# Patient Record
Sex: Male | Born: 1935 | Race: Black or African American | Hispanic: No | Marital: Married | Smoking: Former smoker
Health system: Southern US, Community
[De-identification: ages and names within clinical notes are randomized; demographics above are authoritative.]

## PROBLEM LIST (undated history)

## (undated) DIAGNOSIS — I480 Paroxysmal atrial fibrillation: Secondary | ICD-10-CM

## (undated) DIAGNOSIS — E039 Hypothyroidism, unspecified: Secondary | ICD-10-CM

## (undated) DIAGNOSIS — I447 Left bundle-branch block, unspecified: Secondary | ICD-10-CM

## (undated) DIAGNOSIS — E785 Hyperlipidemia, unspecified: Secondary | ICD-10-CM

## (undated) DIAGNOSIS — Z992 Dependence on renal dialysis: Secondary | ICD-10-CM

## (undated) DIAGNOSIS — F32A Depression, unspecified: Secondary | ICD-10-CM

## (undated) DIAGNOSIS — M1990 Unspecified osteoarthritis, unspecified site: Secondary | ICD-10-CM

## (undated) DIAGNOSIS — I2510 Atherosclerotic heart disease of native coronary artery without angina pectoris: Secondary | ICD-10-CM

## (undated) DIAGNOSIS — D638 Anemia in other chronic diseases classified elsewhere: Secondary | ICD-10-CM

## (undated) DIAGNOSIS — I998 Other disorder of circulatory system: Secondary | ICD-10-CM

## (undated) DIAGNOSIS — I10 Essential (primary) hypertension: Secondary | ICD-10-CM

## (undated) DIAGNOSIS — I219 Acute myocardial infarction, unspecified: Secondary | ICD-10-CM

## (undated) DIAGNOSIS — Z95810 Presence of automatic (implantable) cardiac defibrillator: Secondary | ICD-10-CM

## (undated) DIAGNOSIS — K922 Gastrointestinal hemorrhage, unspecified: Secondary | ICD-10-CM

## (undated) DIAGNOSIS — I70229 Atherosclerosis of native arteries of extremities with rest pain, unspecified extremity: Secondary | ICD-10-CM

## (undated) DIAGNOSIS — F329 Major depressive disorder, single episode, unspecified: Secondary | ICD-10-CM

## (undated) DIAGNOSIS — I471 Supraventricular tachycardia: Secondary | ICD-10-CM

## (undated) DIAGNOSIS — Z9289 Personal history of other medical treatment: Secondary | ICD-10-CM

## (undated) DIAGNOSIS — I5022 Chronic systolic (congestive) heart failure: Secondary | ICD-10-CM

## (undated) DIAGNOSIS — I255 Ischemic cardiomyopathy: Secondary | ICD-10-CM

## (undated) DIAGNOSIS — C61 Malignant neoplasm of prostate: Secondary | ICD-10-CM

## (undated) DIAGNOSIS — N186 End stage renal disease: Secondary | ICD-10-CM

## (undated) DIAGNOSIS — I4719 Other supraventricular tachycardia: Secondary | ICD-10-CM

## (undated) DIAGNOSIS — I739 Peripheral vascular disease, unspecified: Secondary | ICD-10-CM

## (undated) DIAGNOSIS — Z8739 Personal history of other diseases of the musculoskeletal system and connective tissue: Secondary | ICD-10-CM

## (undated) HISTORY — PX: INSERTION PROSTATE RADIATION SEED: SUR718

## (undated) HISTORY — PX: TOTAL KNEE ARTHROPLASTY: SHX125

## (undated) HISTORY — PX: BIV ICD GENERTAOR CHANGE OUT: SHX5745

## (undated) HISTORY — DX: Other disorder of circulatory system: I99.8

## (undated) HISTORY — DX: Malignant neoplasm of prostate: C61

## (undated) HISTORY — PX: JOINT REPLACEMENT: SHX530

## (undated) HISTORY — DX: Hyperlipidemia, unspecified: E78.5

## (undated) HISTORY — DX: Atherosclerosis of native arteries of extremities with rest pain, unspecified extremity: I70.229

---

## 2001-01-10 ENCOUNTER — Encounter (HOSPITAL_BASED_OUTPATIENT_CLINIC_OR_DEPARTMENT_OTHER): Payer: Self-pay | Admitting: General Surgery

## 2001-01-12 ENCOUNTER — Ambulatory Visit (HOSPITAL_COMMUNITY): Admission: RE | Admit: 2001-01-12 | Discharge: 2001-01-12 | Payer: Self-pay | Admitting: General Surgery

## 2001-01-12 ENCOUNTER — Encounter (INDEPENDENT_AMBULATORY_CARE_PROVIDER_SITE_OTHER): Payer: Self-pay | Admitting: *Deleted

## 2001-02-13 ENCOUNTER — Other Ambulatory Visit: Admission: RE | Admit: 2001-02-13 | Discharge: 2001-02-13 | Payer: Self-pay | Admitting: Urology

## 2001-02-13 ENCOUNTER — Encounter (INDEPENDENT_AMBULATORY_CARE_PROVIDER_SITE_OTHER): Payer: Self-pay | Admitting: Specialist

## 2001-02-23 ENCOUNTER — Encounter: Admission: RE | Admit: 2001-02-23 | Discharge: 2001-05-24 | Payer: Self-pay | Admitting: Radiation Oncology

## 2001-05-29 ENCOUNTER — Encounter: Payer: Self-pay | Admitting: Urology

## 2001-05-29 ENCOUNTER — Ambulatory Visit (HOSPITAL_COMMUNITY): Admission: RE | Admit: 2001-05-29 | Discharge: 2001-05-29 | Payer: Self-pay | Admitting: Urology

## 2001-06-19 ENCOUNTER — Ambulatory Visit: Admission: RE | Admit: 2001-06-19 | Discharge: 2001-09-17 | Payer: Self-pay | Admitting: Radiation Oncology

## 2002-02-28 ENCOUNTER — Encounter: Payer: Self-pay | Admitting: *Deleted

## 2002-03-06 ENCOUNTER — Inpatient Hospital Stay (HOSPITAL_COMMUNITY): Admission: RE | Admit: 2002-03-06 | Discharge: 2002-03-10 | Payer: Self-pay | Admitting: *Deleted

## 2003-06-24 ENCOUNTER — Encounter: Payer: Self-pay | Admitting: Pulmonary Disease

## 2003-06-24 ENCOUNTER — Ambulatory Visit (HOSPITAL_COMMUNITY): Admission: RE | Admit: 2003-06-24 | Discharge: 2003-06-24 | Payer: Self-pay | Admitting: Pulmonary Disease

## 2003-07-02 ENCOUNTER — Emergency Department (HOSPITAL_COMMUNITY): Admission: EM | Admit: 2003-07-02 | Discharge: 2003-07-02 | Payer: Self-pay | Admitting: Emergency Medicine

## 2003-07-12 ENCOUNTER — Ambulatory Visit (HOSPITAL_COMMUNITY): Admission: RE | Admit: 2003-07-12 | Discharge: 2003-07-12 | Payer: Self-pay | Admitting: Pulmonary Disease

## 2003-07-12 ENCOUNTER — Encounter: Payer: Self-pay | Admitting: Pulmonary Disease

## 2005-02-22 HISTORY — PX: CORONARY ANGIOPLASTY WITH STENT PLACEMENT: SHX49

## 2005-02-26 ENCOUNTER — Inpatient Hospital Stay (HOSPITAL_COMMUNITY): Admission: EM | Admit: 2005-02-26 | Discharge: 2005-03-09 | Payer: Self-pay | Admitting: Emergency Medicine

## 2005-03-01 ENCOUNTER — Encounter (INDEPENDENT_AMBULATORY_CARE_PROVIDER_SITE_OTHER): Payer: Self-pay | Admitting: Cardiology

## 2005-03-29 ENCOUNTER — Encounter (HOSPITAL_COMMUNITY): Admission: RE | Admit: 2005-03-29 | Discharge: 2005-06-27 | Payer: Self-pay | Admitting: Cardiology

## 2005-06-09 ENCOUNTER — Ambulatory Visit (HOSPITAL_COMMUNITY): Admission: RE | Admit: 2005-06-09 | Discharge: 2005-06-09 | Payer: Self-pay | Admitting: Pulmonary Disease

## 2005-07-27 ENCOUNTER — Encounter (HOSPITAL_COMMUNITY): Admission: RE | Admit: 2005-07-27 | Discharge: 2005-10-24 | Payer: Self-pay | Admitting: Cardiology

## 2005-10-26 ENCOUNTER — Encounter (HOSPITAL_COMMUNITY): Admission: RE | Admit: 2005-10-26 | Discharge: 2006-01-24 | Payer: Self-pay | Admitting: Cardiology

## 2005-11-11 ENCOUNTER — Ambulatory Visit (HOSPITAL_COMMUNITY): Admission: RE | Admit: 2005-11-11 | Discharge: 2005-11-11 | Payer: Self-pay | Admitting: Pulmonary Disease

## 2006-08-01 ENCOUNTER — Encounter (HOSPITAL_COMMUNITY): Admission: RE | Admit: 2006-08-01 | Discharge: 2006-10-30 | Payer: Self-pay | Admitting: Cardiology

## 2006-10-25 HISTORY — PX: OTHER SURGICAL HISTORY: SHX169

## 2006-11-04 ENCOUNTER — Ambulatory Visit: Payer: Self-pay | Admitting: Internal Medicine

## 2006-11-04 LAB — CONVERTED CEMR LAB
Calcium: 9.2 mg/dL (ref 8.4–10.5)
Chloride: 104 meq/L (ref 96–112)
Creatinine, Ser: 2.1 mg/dL — ABNORMAL HIGH (ref 0.4–1.5)
Glomerular Filtration Rate, Af Am: 40 mL/min/{1.73_m2}
Glucose, Bld: 117 mg/dL — ABNORMAL HIGH (ref 70–99)
HCT: 44.3 % (ref 39.0–52.0)
INR: 2.2 — ABNORMAL HIGH (ref 0.9–2.0)
Potassium: 3.7 meq/L (ref 3.5–5.1)
WBC: 4.1 10*3/uL — ABNORMAL LOW (ref 4.5–10.5)

## 2006-11-07 ENCOUNTER — Ambulatory Visit: Payer: Self-pay | Admitting: Internal Medicine

## 2006-11-07 ENCOUNTER — Inpatient Hospital Stay (HOSPITAL_COMMUNITY): Admission: RE | Admit: 2006-11-07 | Discharge: 2006-11-08 | Payer: Self-pay | Admitting: Internal Medicine

## 2006-11-16 ENCOUNTER — Ambulatory Visit: Payer: Self-pay

## 2007-02-14 ENCOUNTER — Ambulatory Visit: Payer: Self-pay | Admitting: Internal Medicine

## 2007-05-16 ENCOUNTER — Ambulatory Visit: Payer: Self-pay | Admitting: Internal Medicine

## 2007-08-15 ENCOUNTER — Ambulatory Visit: Payer: Self-pay | Admitting: Internal Medicine

## 2007-09-30 ENCOUNTER — Inpatient Hospital Stay (HOSPITAL_COMMUNITY): Admission: EM | Admit: 2007-09-30 | Discharge: 2007-10-05 | Payer: Self-pay | Admitting: Emergency Medicine

## 2007-09-30 ENCOUNTER — Ambulatory Visit: Payer: Self-pay | Admitting: Family Medicine

## 2007-11-21 ENCOUNTER — Ambulatory Visit: Payer: Self-pay | Admitting: Internal Medicine

## 2008-02-20 ENCOUNTER — Ambulatory Visit: Payer: Self-pay | Admitting: Internal Medicine

## 2008-05-14 ENCOUNTER — Ambulatory Visit: Payer: Self-pay | Admitting: Internal Medicine

## 2008-08-13 ENCOUNTER — Ambulatory Visit: Payer: Self-pay | Admitting: Internal Medicine

## 2008-09-07 ENCOUNTER — Emergency Department (HOSPITAL_COMMUNITY): Admission: EM | Admit: 2008-09-07 | Discharge: 2008-09-07 | Payer: Self-pay | Admitting: Emergency Medicine

## 2008-11-13 ENCOUNTER — Ambulatory Visit: Payer: Self-pay | Admitting: Internal Medicine

## 2008-11-19 ENCOUNTER — Ambulatory Visit: Payer: Self-pay | Admitting: Internal Medicine

## 2008-11-20 ENCOUNTER — Encounter: Payer: Self-pay | Admitting: Internal Medicine

## 2009-02-20 ENCOUNTER — Encounter: Payer: Self-pay | Admitting: Internal Medicine

## 2009-02-24 ENCOUNTER — Encounter: Payer: Self-pay | Admitting: Internal Medicine

## 2009-02-25 ENCOUNTER — Ambulatory Visit: Payer: Self-pay | Admitting: Internal Medicine

## 2009-03-04 ENCOUNTER — Encounter: Payer: Self-pay | Admitting: Internal Medicine

## 2009-05-27 ENCOUNTER — Ambulatory Visit: Payer: Self-pay | Admitting: Internal Medicine

## 2009-06-06 ENCOUNTER — Encounter: Payer: Self-pay | Admitting: Internal Medicine

## 2009-09-11 ENCOUNTER — Encounter: Payer: Self-pay | Admitting: Internal Medicine

## 2009-09-23 ENCOUNTER — Ambulatory Visit: Payer: Self-pay | Admitting: Internal Medicine

## 2009-09-29 ENCOUNTER — Encounter: Payer: Self-pay | Admitting: Internal Medicine

## 2009-11-13 ENCOUNTER — Ambulatory Visit: Payer: Self-pay | Admitting: Internal Medicine

## 2009-11-13 DIAGNOSIS — Z95810 Presence of automatic (implantable) cardiac defibrillator: Secondary | ICD-10-CM

## 2009-11-13 DIAGNOSIS — I5022 Chronic systolic (congestive) heart failure: Secondary | ICD-10-CM | POA: Insufficient documentation

## 2010-02-19 ENCOUNTER — Encounter: Payer: Self-pay | Admitting: Internal Medicine

## 2010-05-12 ENCOUNTER — Encounter: Payer: Self-pay | Admitting: Internal Medicine

## 2010-05-15 ENCOUNTER — Encounter: Payer: Self-pay | Admitting: Internal Medicine

## 2010-05-26 ENCOUNTER — Encounter: Payer: Self-pay | Admitting: Internal Medicine

## 2010-08-21 ENCOUNTER — Encounter: Payer: Self-pay | Admitting: Internal Medicine

## 2010-08-25 ENCOUNTER — Ambulatory Visit: Payer: Self-pay | Admitting: Internal Medicine

## 2010-09-24 ENCOUNTER — Ambulatory Visit: Payer: Self-pay | Admitting: Internal Medicine

## 2010-09-30 ENCOUNTER — Telehealth (INDEPENDENT_AMBULATORY_CARE_PROVIDER_SITE_OTHER): Payer: Self-pay | Admitting: *Deleted

## 2010-10-07 ENCOUNTER — Encounter: Payer: Self-pay | Admitting: Internal Medicine

## 2010-10-17 ENCOUNTER — Emergency Department (HOSPITAL_COMMUNITY)
Admission: EM | Admit: 2010-10-17 | Discharge: 2010-10-17 | Payer: Self-pay | Source: Home / Self Care | Admitting: Emergency Medicine

## 2010-11-14 ENCOUNTER — Encounter (INDEPENDENT_AMBULATORY_CARE_PROVIDER_SITE_OTHER): Payer: Self-pay | Admitting: *Deleted

## 2010-11-20 ENCOUNTER — Encounter: Payer: Self-pay | Admitting: Internal Medicine

## 2010-11-23 ENCOUNTER — Ambulatory Visit: Admit: 2010-11-23 | Payer: Self-pay | Admitting: Internal Medicine

## 2010-11-24 NOTE — Assessment & Plan Note (Signed)
Summary: PER CHECK OUT/SF   Visit Type:  Follow-up Primary Provider:  Corine Shelter, MD   History of Present Illness: [name] returns today for followup, he is a very pleasant male with a history of longstanding ischemic cardiomyopathy and congestive heart failure, left bundle-branch block, he is status post Bi-V ICD insertion. He returns today for followup.  The patient denies chest pain or shortness of breath.  He does admit to some dietary indiscretion with sodium.  He has not been to hospital since last we saw him a year ago. He denies peripheral edema. No palpitations.  Current Medications (verified): 1)  Eplerenone 25 Mg Tabs (Eplerenone) .... Take One Tablet By Mouth Twice Daily. 2)  Lipitor 40 Mg Tabs (Atorvastatin Calcium) .... Take One Tablet By Mouth Daily. 3)  Potassium Chloride Crys Cr 20 Meq Cr-Tabs (Potassium Chloride Crys Cr) .... Take One Tablet By Mouth Twice A Day 4)  Furosemide 80 Mg Tabs (Furosemide) .... Take One Tablet By Mouth Two Times A Day 5)  Coreg Cr 40 Mg Xr24h-Cap (Carvedilol Phosphate) .... Take One Tablet By Mouth Once Daily. 6)  Warfarin Sodium 5 Mg Tabs (Warfarin Sodium) .... Use As Directed By Anticoagulation Clinic 7)  Multivitamins   Tabs (Multiple Vitamin) .... Take One Tablet By Mouth Once Daily. 8)  Cymbalta 60 Mg Cpep (Duloxetine Hcl) .... Take One Tablet By Mouth Once Daily. 9)  Trazodone Hcl .Marland Kitchen.. 1 Tablet At Bedtime  Allergies (verified): No Known Drug Allergies  Past History:  Past Medical History: Last updated: 11/19/2008 Ischemic Cardiomyopathy EF 25% Dyslipidemia Congestive heart Failure class 2 Atrial fibrillation  Prostate CA  Past Surgical History: Last updated: 11/19/2008 S/P Medtronic BiV ICD 1/08 S/P PCI/Stent 05/06 S/P knee replacement  Review of Systems  The patient denies chest pain, syncope, dyspnea on exertion, and peripheral edema.    Vital Signs:  Patient profile:   77 year old male Height:      71  inches Weight:      208 pounds BMI:     29.11 Pulse rate:   82 / minute BP sitting:   170 / 70  (left arm)  Vitals Entered By: Laurance Flatten CMA (November 13, 2009 11:47 AM)  Physical Exam  General:  Well developed, well nourished, in no acute distress.  HEENT: normal Neck: supple. No JVD. Carotids 2+ bilaterally no bruits Cor: RRR no rubs, gallops or murmur Lungs: CTA.  Well healed ICD incision. Ab: soft, nontender. nondistended. No HSM. Good bowel sounds Ext: warm. no cyanosis, clubbing or edema Neuro: alert and oriented. Grossly nonfocal. affect pleasant     ICD Specifications Following MD:  Lewayne Bunting, MD     ICD Vendor:  Medtronic     ICD Model Number:  7299     ICD Serial Number:  [id number] ICD DOI:  11/07/2006     ICD Implanting MD:  Lewayne Bunting, MD  Lead 1:    Location: RA     DOI: 11/07/2006     Model #: 4098     Serial #: [id number]     Status: active Lead 2:    Location: RV     DOI: 11/07/2006     Model #: 6213     Serial #: [id number]     Status: active Lead 3:    Location: LV     DOI: 11/07/2006     Model #: [id number]     Serial #: [id number]     Status: active  Indications::  ICM, CHF   ICD Follow Up Remote Check?  No Battery Voltage:  2.90 V     Charge Time:  9.9 seconds     Underlying rhythm:  SR ICD Dependent:  No       ICD Device Measurements Atrium:  Amplitude: 1.4 mV, Impedance: 464 ohms, Threshold: 1.0 V at 0.4 msec Right Ventricle:  Amplitude: 13.4 mV, Impedance: 368 ohms, Threshold: 1.0 V at 0.4 msec Left Ventricle:  Impedance: 456 ohms, Threshold: 1.0 V at 0.2 msec Configuration: LV TIP TO RV COIL Shock Impedance: 41/52 ohms   Episodes MS Episodes:  16     Shock:  0     ATP:  1     Nonsustained:  8     Atrial Pacing:  1%     Ventricular Pacing:  97.9%  Brady Parameters Mode DDD     Lower Rate Limit:  50     Upper Rate Limit 130 PAV 140     Sensed AV Delay:  120  Tachy Zones VF:  200     VT:  250 FVT VIA VF     VT1:  171     Next Remote Date:   02/10/2010     Next Cardiology Appt Due:  10/26/2010 Tech Comments:  Normal device function.  No changes made today.  VT episode treated with ATP was in March of last year.  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  Total mode switch time less than 2 minutes.  ROV 12 months GT. Gypsy Balsam RN BSN  November 13, 2009 12:05 PM  MD Comments:  Agree with above.  Impression & Recommendations:  Problem # 1:  AUTOMATIC IMPLANTABLE CARDIAC DEFIBRILLATOR SITU (ICD-V45.02) His device is working normally.  Will recheck in several months.  Problem # 2:  ATRIAL FIBRILLATION (ICD-427.31) His symptoms are minimal and his device interogation demonstrates that his atrial fibrillation is short lived. His updated medication list for this problem includes:    Coreg Cr 40 Mg Xr24h-cap (Carvedilol phosphate) .Marland Kitchen... Take one tablet by mouth once daily.    Warfarin Sodium 5 Mg Tabs (Warfarin sodium) ..... Use as directed by anticoagulation clinic  Problem # 3:  CHRONIC SYSTOLIC HEART FAILURE (ICD-428.22) He remains class 2.  Continue current meds.  A low sodium diet is recommended. His updated medication list for this problem includes:    Furosemide 80 Mg Tabs (Furosemide) .Marland Kitchen... Take one tablet by mouth two times a day    Coreg Cr 40 Mg Xr24h-cap (Carvedilol phosphate) .Marland Kitchen... Take one tablet by mouth once daily.    Warfarin Sodium 5 Mg Tabs (Warfarin sodium) ..... Use as directed by anticoagulation clinic

## 2010-11-24 NOTE — Letter (Signed)
Summary: Device-Delinquent Phone Journalist, newspaper, Main Office  [address]   [place], [place] [address]   Phone: [phone]  Fax: [phone]     May 12, 2010 MRN: [id number]   ERSKINE STEINFELDT [address] [place], [place]  [address]   Dear Mr. KOLLMANN,  According to our records, you were scheduled for a device phone transmission on 02-10-2010.     We did not receive any results from this check.  If you transmitted on your scheduled day, please call us to help troubleshoot your system.  If you forgot to send your transmission, please send one upon receipt of this letter.  Thank you,   Architectural technologist [facility]

## 2010-11-24 NOTE — Letter (Signed)
Summary: Device-Delinquent Phone Journalist, newspaper, Main Office  [address]   [place], [place] [address]   Phone: [phone]  Fax: [phone]     February 19, 2010 MRN: [id number]   [name] [address] [place], [place]  [address]   Dear [name],  According to our records, you were scheduled for a device phone transmission on  02-10-2010.     We did not receive any results from this check.  If you transmitted on your scheduled day, please call us to help troubleshoot your system.  If you forgot to send your transmission, please send one upon receipt of this letter.  Thank you,   Architectural technologist [facility]

## 2010-11-24 NOTE — Letter (Signed)
Summary: Device-Delinquent Phone Journalist, newspaper, Main Office  [address]   [place], [place] [address]   Phone: [phone]  Fax: [phone]     August 21, 2010 MRN: [id number]   [name] [address] [place], [place]  [address]   Dear [name],  According to our records, you were scheduled for a device phone transmission on 08-13-2010.     We did not receive any results from this check.  If you transmitted on your scheduled day, please call us to help troubleshoot your system.  If you forgot to send your transmission, please send one upon receipt of this letter.  Thank you,   Architectural technologist [facility]

## 2010-11-24 NOTE — Cardiovascular Report (Signed)
Summary: Office Visit Remote   Office Visit Remote   Imported By: Roderic Ovens 05/28/2010 16:04:33  _____________________________________________________________________  External Attachment:    Type:   Image     Comment:   External Document

## 2010-11-24 NOTE — Progress Notes (Signed)
Summary: device  transmission   Phone Note Call from Patient Call back at Home Phone [phone]   Caller: Patient Reason for Call: Talk to Nurse Summary of Call: pt has question on device phone transmission  Initial call taken by: Roe Coombs,  September 30, 2010 12:36 PM  Follow-up for Phone Call        Transmission recieved, patient aware. Follow-up by: Altha Harm, LPN,  October 01, 2010 8:05 AM

## 2010-11-24 NOTE — Letter (Signed)
Summary: Remote Device Check  [employer], Main Office  [address]   [place], [place] [address]   Phone: [phone]  Fax: [phone]     May 26, 2010 MRN: [id number]   [name] [address] [place], [place]  [address]   Dear [name],   Your remote transmission was recieved and reviewed by your physician.  All diagnostics were within normal limits for you.  __X___Your next transmission is scheduled for:   08-13-2010.  Please transmit at any time this day.  If you have a wireless device your transmission will be sent automatically.   Sincerely,  Vella Kohler

## 2010-11-26 NOTE — Letter (Signed)
Summary: Device-Delinquent Phone Journalist, newspaper, Main Office  [address]   [place], [place] [address]   Phone: [phone]  Fax: [phone]     November 14, 2010 MRN: [id number]   [name] [address] [place], [place]  [address]   Dear [name],  According to our records, you were scheduled for a device phone transmission on 11-12-2010.     We did not receive any results from this check.  If you transmitted on your scheduled day, please call us to help troubleshoot your system.  If you forgot to send your transmission, please send one upon receipt of this letter.  Thank you,   Architectural technologist [facility]

## 2010-11-26 NOTE — Cardiovascular Report (Signed)
Summary: Office Visit Remote   Office Visit Remote   Imported By: Roderic Ovens 10/08/2010 14:11:36  _____________________________________________________________________  External Attachment:    Type:   Image     Comment:   External Document

## 2010-11-26 NOTE — Cardiovascular Report (Signed)
Summary: Office Visit Remote   Office Visit Remote   Imported By: Roderic Ovens 10/08/2010 14:13:12  _____________________________________________________________________  External Attachment:    Type:   Image     Comment:   External Document

## 2010-11-26 NOTE — Letter (Signed)
Summary: Remote Device Check  [employer], Main Office  [address]   [place], [place] [address]   Phone: [phone]  Fax: [phone]     October 07, 2010 MRN: [id number]   [name] [address] [place], [place]  [address]   Dear [name],   Your remote transmission was recieved and reviewed by your physician.  All diagnostics were within normal limits for you.  __X____Your next office visit is scheduled for:   March 2012 with Dr Ladona Ridgel. Please call our office to schedule an appointment.    Sincerely,  Vella Kohler

## 2010-12-04 ENCOUNTER — Encounter (INDEPENDENT_AMBULATORY_CARE_PROVIDER_SITE_OTHER): Payer: Self-pay | Admitting: *Deleted

## 2010-12-10 NOTE — Letter (Signed)
Summary: Remote Device Check  [employer], Main Office  [address]   [place], [place] [address]   Phone: [phone]  Fax: [phone]     December 04, 2010 MRN: [id number]   BRYNDEN THUNE [address] [place], [place]  [address]   Dear Mr. GROCHOWSKI,   Your remote transmission was recieved and reviewed by your physician.  All diagnostics were within normal limits for you.  ___X__Your next transmission is scheduled for:   12/31/10.   Please transmit at any time this day.  If you have a wireless device your transmission will be sent automatically.  ______Your next office visit is scheduled for:                              . Please call our office to schedule an appointment.    Sincerely,  Altha Harm, LPN

## 2010-12-16 NOTE — Cardiovascular Report (Signed)
Summary: Office Visit Remote   Office Visit Remote   Imported By: Roderic Ovens 12/07/2010 10:02:04  _____________________________________________________________________  External Attachment:    Type:   Image     Comment:   External Document

## 2010-12-31 ENCOUNTER — Encounter: Payer: Self-pay | Admitting: Internal Medicine

## 2010-12-31 DIAGNOSIS — I428 Other cardiomyopathies: Secondary | ICD-10-CM

## 2011-01-04 LAB — CBC
HCT: 41.6 % (ref 39.0–52.0)
MCH: 29.1 pg (ref 26.0–34.0)
MCHC: 33.4 g/dL (ref 30.0–36.0)
MCV: 87.2 fL (ref 78.0–100.0)
Platelets: 119 10*3/uL — ABNORMAL LOW (ref 150–400)
WBC: 4.6 10*3/uL (ref 4.0–10.5)

## 2011-01-04 LAB — URINALYSIS, ROUTINE W REFLEX MICROSCOPIC
Bilirubin Urine: NEGATIVE
Nitrite: NEGATIVE
Specific Gravity, Urine: 1.017 (ref 1.005–1.030)
Urobilinogen, UA: 1 mg/dL (ref 0.0–1.0)

## 2011-01-04 LAB — COMPREHENSIVE METABOLIC PANEL
ALT: 12 U/L (ref 0–53)
AST: 27 U/L (ref 0–37)
Albumin: 3.8 g/dL (ref 3.5–5.2)
BUN: 14 mg/dL (ref 6–23)
GFR calc Af Amer: 49 mL/min — ABNORMAL LOW (ref >60)
GFR calc non Af Amer: 41 mL/min — ABNORMAL LOW (ref >60)
Glucose, Bld: 112 mg/dL — ABNORMAL HIGH (ref 70–99)
Potassium: 3.9 mEq/L (ref 3.5–5.1)
Sodium: 143 mEq/L (ref 135–145)
Total Protein: 7 g/dL (ref 6.0–8.3)

## 2011-01-04 LAB — DIFFERENTIAL
Basophils Absolute: 0 10*3/uL (ref 0.0–0.1)
Basophils Relative: 0 % (ref 0–1)
Eosinophils Absolute: 0.2 10*3/uL (ref 0.0–0.7)
Monocytes Absolute: 0.4 10*3/uL (ref 0.1–1.0)
Monocytes Relative: 9 % (ref 3–12)

## 2011-01-04 LAB — URINE CULTURE

## 2011-01-04 LAB — CK TOTAL AND CKMB (NOT AT ARMC)
CK, MB: 2.5 ng/mL (ref 0.3–4.0)
Total CK: 110 U/L (ref 7–232)

## 2011-01-04 LAB — TROPONIN I: Troponin I: 0.1 ng/mL — ABNORMAL HIGH (ref 0.00–0.06)

## 2011-01-04 LAB — PROTIME-INR: INR: 2.8 — ABNORMAL HIGH (ref 0.00–1.49)

## 2011-01-12 NOTE — Cardiovascular Report (Signed)
Summary: Office Visit   Office Visit   Imported By: Roderic Ovens 01/08/2011 15:29:16  _____________________________________________________________________  External Attachment:    Type:   Image     Comment:   External Document

## 2011-02-04 ENCOUNTER — Encounter: Payer: Federal, State, Local not specified - PPO | Admitting: *Deleted

## 2011-02-09 ENCOUNTER — Encounter: Payer: Self-pay | Admitting: *Deleted

## 2011-02-12 ENCOUNTER — Other Ambulatory Visit: Payer: Self-pay | Admitting: Internal Medicine

## 2011-02-12 ENCOUNTER — Ambulatory Visit (INDEPENDENT_AMBULATORY_CARE_PROVIDER_SITE_OTHER): Payer: Medicare Other | Admitting: *Deleted

## 2011-02-12 DIAGNOSIS — I4891 Unspecified atrial fibrillation: Secondary | ICD-10-CM

## 2011-02-12 DIAGNOSIS — Z95810 Presence of automatic (implantable) cardiac defibrillator: Secondary | ICD-10-CM

## 2011-02-12 DIAGNOSIS — I2589 Other forms of chronic ischemic heart disease: Secondary | ICD-10-CM

## 2011-02-16 NOTE — Progress Notes (Signed)
icd remote check  

## 2011-03-08 ENCOUNTER — Encounter: Payer: Self-pay | Admitting: Internal Medicine

## 2011-03-09 NOTE — Assessment & Plan Note (Signed)
[facility]                         ELECTROPHYSIOLOGY OFFICE NOTE   NAME:JOYNERLa, Dibella                      MRN:          [id number]  DATE:11/21/2007                            DOB:          1930/02/24    [name] returns today for follow-up.  He is a very pleasant male with  an ischemic cardiomyopathy, congestive heart failure and left bundle  branch block who is status post ICD insertion in the past.  He returns  today for follow-up.  The patient states that he was hospitalized back  in November with constipation and some heart failure. Since then he has  been fairly stable.  His weight has gradually gone down after up  titration of his Lasix.  He otherwise had no specific complaints today.   MEDICATIONS:  1. Lipitor 40 a day.  2. Potassium 20 mEq 3 times a day.  3. Warfarin as directed.  4. Coreg CR 40 a day.  5. Furosemide 80 twice daily.  6. Altace 2.5 daily.  7. Inspra 25 twice daily.   PHYSICAL EXAM:  A pleasant, 77 year old man in no acute distress.  Blood pressure was 134/71, pulse 88 and regular, respirations were 18.  The weight was 179 pounds.  NECK:  Revealed no jugular distention.  LUNGS:  Clear bilaterally to auscultation except for rales in the bases.  CARDIOVASCULAR:  Regular rate and rhythm with normal S1 and S2. There  was a soft systolic murmur in the left lower sternal border.  EXTREMITIES:  Demonstrated no cyanosis, clubbing or edema.   Interrogation of his pacemaker/defibrillator demonstrates a Medtronic  [employer] with P and R waves of 3 and 10 respectively.  The  impedance 568 in the A, 376 in the V. The threshold was a volt at 0.4 in  the A, a volt at 0.3 in the RV and a volt at 0.2 in the LV. Battery  voltage was 3.09 volts. Fluid index today was still increased though it  was better than it had been and he was V-pacing approximately 68% of the  time. Though I note that he had been out of rhythm in A fib for  about 6  months beginning back in April.   IMPRESSION:  1. Ischemic cardiomyopathy.  2. Congestive heart failure, class III.  3. Atrial fibrillation.  4. Status post biopsy ICD insertion.   DISCUSSION:  Overall Mr. Murley is stable and improving.  I have asked  that he continue his Lasix at 80 twice daily along with his other  cardiac medications.  I will plan to see him back in the office in  several months.  He is scheduled for follow-up with Dr. Sharyn Lull.     Doylene Canning. Ladona Ridgel, MD  Electronically Signed    GWT/MedQ  DD: 11/21/2007  DT: 11/21/2007  Job #: [id number]   cc:   Eduardo Osier. Sharyn Lull, M.D.

## 2011-03-09 NOTE — H&P (Signed)
[name], [name] NO.:  [id number]   MEDICAL RECORD NO.:  [id number]          PATIENT TYPE:  INP   LOCATION:  2928                         FACILITY:  [facility]   PHYSICIAN:  Rolm Gala, M.D.    DATE OF BIRTH:  Jul 03, 1930   DATE OF ADMISSION:  09/30/2007  DATE OF DISCHARGE:                              HISTORY & PHYSICAL   DATE OF ADMISSION:  09/30/2007.   CHIEF COMPLAINT:  Blood in his urine.   HISTORY OF PRESENT ILLNESS:  A 77 year old male with recent increase in  nosebleeds in the last few weeks.  This morning he started having blood  in his urine.  The patient is on Coumadin.  It was last checked November  12 by Dr. Si Raider, who normally follows it.  He also had heme positive  stools at urgent care and he mentioned, oh, yeah, maybe I do have a  little blood in my stools.  No headache.  The patient does complain of  weakness and unsteadiness.  Normal vision.  He has had some recent  constipation.  No recent antibiotic use.  No change in diet except  during Thanksgiving.  He gives himself his medicines.  No dysuria.  No  increase in urinary frequency.  No chest pain and no shortness of  breath.   MEDICATIONS:  1. Coumadin 5 mg daily except 7.5 mg on Monday.  2. Coreg CR 80.  3. Lipitor 40 mg 40.  4. Aspirin 81.  5. Inspra 25 b.i.d.  6. Allopurinol 100 daily.  7. Altace unknown dose.  8. Cymbalta 60 mg.  9. KCl 20 mg 3 times daily.  10.Multivitamin.   ALLERGIES:  NO KNOWN DRUG ALLERGIES.   PAST MEDICAL HISTORY:  1. Hypertension.  2. Hyperlipidemia.  3. Prostate cancer followed by Dr. Brunilda Payor.  4. Knee replacement.  5. Status post MI in 2006 with placement of the pacer in January 2008.      The patient also has a stent.  6. CHF.  New York Heart Score 3 with an EF of 35%.  7. Gout.  8. Glucose intolerance.   SOCIAL HISTORY:  The patient lives with his wife of 47 years.  He has a  son in [place].  He is a retired Airline pilot.  He quit  tobacco  25 years ago.  No alcohol and no drugs.   FAMILY HISTORY:  His mom has diabetes.   PHYSICAL EXAMINATION:  VITAL SIGNS:  Temperature 97, pulse 91, blood  pressure 83/57 which came up to 93/68, respirations 18, 100% on room  air.  HEENT:  Oropharynx clear with dry mucous membranes.  No nose bleeding.  Normal conjunctivae.  HEART:  Regular rate and rhythm.  No murmurs.  LUNGS:  Clear to auscultation bilaterally.  ABDOMEN:  Soft, nontender.  Normal active bowel sounds.  No cyanosis,  clubbing or edema.  GENITOURINARY:  Urine drawn by the bed was dark red.  SKIN:  The patient had no jaundice and poor skin turgor.   LABORATORY DATA:  White blood cells 7.4, hemoglobin and hematocrit  8.4/25.8, platelets 183.  INR greater than 10.8.  UA showed specific  gravity of 1.037, large hemoglobin, large bilirubin, ketones 40, protein  greater than 300, nitrates positive.  Sodium 141, potassium 3.8,  chloride 103, bicarb 26.7, BUN 140, creatinine 0.5, glucose 126.   ASSESSMENT/PLAN:  A 77 year old male with:  1. Heme.  Coumadin is super therapeutic.  We will hold SFP x2, blood      x2.  Replace __________  due to congestive heart failure.  Recheck      complete blood count every 4 hours.  Elevated BUN might be      secondary to bleeding into gut.  We will repeat normal saline 500      cc bolus due to continued hypotension and awaiting blood.  Recheck      INR in the morning.  2. Acute-on-chronic renal failure may be secondary to hypotension.      Continues not being perfused.  We will give fluids and recheck.  3. Hypertension.  Holding medications.  4. Gout.  Hold allopurinol due to acute renal failure.  5. Coronary artery disease.  Aspirin, Lipitor, beta blocker.  6. Glucose intolerance.  We will watch his sugars and maybe an ACE      inhibitor.  7. Prophylaxis.  Sequential compression devices and Protonix.      Rolm Gala, M.D.  Electronically Signed     HG/MEDQ  D:   09/30/2007  T:  10/01/2007  Job:  [id number]

## 2011-03-09 NOTE — Discharge Summary (Signed)
NAMEMICHEIL, KLAUS NO.:  [id number]   MEDICAL RECORD NO.:  [id number]          PATIENT TYPE:  INP   LOCATION:  6704                         FACILITY:  [facility]   PHYSICIAN:  Ancil Boozer, MD      DATE OF BIRTH:  1930-07-11   DATE OF ADMISSION:  09/30/2007  DATE OF DISCHARGE:  10/05/2007                               DISCHARGE SUMMARY   PRIMARY CARE PHYSICIAN:  Mina Marble, M.D.   UROLOGIST:  Lindaann Slough, M.D.   CARDIOLOGISTEduardo Osier Sharyn Lull, M.D.   REASON FOR ADMISSION:  Elevated INR to greater than 10 with active  bleeding in the form of epistaxis, hematuria, and hematemesis.   DISCHARGE DIAGNOSES:  1. Supra therapeutic international normalized ratio.  2. Acute on chronic renal insufficiency.  3. Bladder mass versus clot.  4. Congestive heart failure New York Heart Association Class III      secondary to ischemia.  5. Hyperlipidemia.  6. History of hypertension.  7. History of gout.  8. History of prostate cancer.   DISCHARGE MEDICATIONS:  1. Lipitor 40 mg p.o. daily.  2. Cymbalta 60 mg p.o. daily.  3. Potassium 20 mEq p.o. t.i.d.  4. Multivitamin daily.   The patient's medicines which were held were;  1. Coumadin.  2. Aspirin.  3. Coreg.  4. Allopurinol.  5. Altace.  6. Inspra.   CONSULTS:  None.   PROCEDURE STUDIES:  Renal ultrasound done on October 02, 2007, revealed  a nonspecific renal medical disease, a left renal cyst in the mid upper  pole, no hydronephrosis, and a bladder mass versus clot.  So we  recommended considering cystoscopy for further workup.   LABORATORY DATA:  INR at time of admission was greater than 10.8.  CBC  revealed white blood cell count 7.4, hemoglobin 8.4, hematocrit 25.8,  platelet count 183,000.  Urinalysis revealed large bilirubin, 40  ketones, large blood greater than 300 of protein, 1.0 urobilinogen,  positive nitrite, and moderate leukocyte esterase with a specific  gravity of 1.037.   Subsequent hemoglobin dropped to 8.0.  Cardiac panel  was drawn and revealed CK 156, CK-MB 2.4, troponin I 0.05, given concern  for low hemoglobin in a setting of history of ischemic heart disease.  Random urine creatinine was 105 mg/dL, random urine sodium was 31, which  calculate out to a FeNa of less than 1.  Hemoglobin A1c was 12.8.  Fecal  occult blood was positive on October 03, 2007, but negative on October 05, 2007.  Urine culture were greater than 100,000 colonies of multiple  bacterial morphocytes present and suggested appropriate recollection as  clinically indicated.  CBC at discharge revealed a white blood cell  count 9.0, hemoglobin 11.0, hematocrit 32.7, and platelet count 153,000,  BMP at admission was 43 and comprehensive metabolic panel revealed  sodium 141, potassium 4.5, chloride 103, bicarb 28, glucose 159, BUN  135, creatinine 4.12, total bilirubin 1.8, alkaline phosphatase 113, AST  34, ALT 32, total protein 6.0, albumin 2.8, and calcium 8.2.  Creatinine  continued to rise to a high of 4.46, but  at time of discharge it turned  it down to 2.82.   HOSPITAL COURSE:  1. Supra therapeutic INR with subsequent bleeding and anemia.  The      patient was treated immediately with some vitamin K and was given a      total of 4 units of fresh frozen plasma and 5 units of packed red      blood cells in order to keep his hemoglobin about 10 in order to      stop his active bleeding.  This was successful by October 01, 2007,      his INR was down to 4.4 and by October 02, 2007, his INR was down      to 2.0.  At time of discharge, his INR was subtherapeutic at 1.7.      His hemoglobin and hematocrit were stable for approximately 3 days.      By time of discharge hemaglobin was between 10.5 and 11.  At time      of discharge, his hemoglobin was continuing to rise.  His Coumadin      was held as well as his aspirin throughout his hospitalization      despite his numerous factor of  AFib for being hypercoagulable.  It      was given that he had continued hematuria at time of discharge that      we would not start his Coumadin not before to restart his to his      PCP and/or his cardiologist.  2. Acute on chronic renal insufficiency and hematuria.  It was felt      that the patient likely had worsening of his chronic renal      insufficiency secondary to hypotension and thus hypoperfusion.      With improvement in the patient's volume status and creatinine      began to improve.  The patient's baseline is 2 and at time of      discharge he was certainly much closer to that and continuing to      trend towards that at 2.82.  Certainly, he will need close followup      with his urologist as an outpatient given that a renal ultrasound a      mass versus clot was found in the bladder.  It was felt that      cystoscopy would be the best way to further classify this mass.      Dr. Brunilda Payor was called regarding this mass and agreed the outpatient      workup would be beneficial.  At time of discharge, the patient was      urinating without difficulty and had good urine output.  3. Congestive heart failure.  The patient was stable throughout      hospitalization and did not develop any problems so brought to his      heart failure.  4. Hyperlipidemia.  The patient was continued on his Lipitor      throughout hospital stay.  5. Hypertension.  When the patient initially came in, he was actually      hypotensive secondary to the blood loss.  His hypertensive meds      were held and the patient was continuing to have systolic 100s-120s      at time of discharge.  His blood pressure medicines were thus not      restarted and we will defer to his PCP and cardiologist and went to  restart these medications.   INSTRUCTIONS AND FOLLOWUP:  The patient is to follow a low sodium heart  healthy diet.  A wound care is not applicable.  He has no restrictions  with regard to his activity  and home PT will be setup given the patient  somewhat decondition from his hospital stay.  The patient has followup  appointments setup with Dr. Petra Kuba, his primary care physician on  October 10, 2007, at 10:45 a.m. with Dr. Sharyn Lull, his cardiologist,  phone number [phone].  On October 23, 2007, at 11 a.m. with Dr. Brunilda Payor  his urologist, phone number [phone] on October 06, 2007, at 2 p.m.  He  is to stop taking his Coumadin, aspirin, Coreg, allopurinol, Altace, and  Inspra for now with the  assumption that these things will be re-added as necessary and followup  with Dr. Petra Kuba and basic metabolic panel, hemoglobin be checked to  ensure stability and hemoglobin and to ensure continued improvement in  his creatinine.  We would also ask that his hypertensive medicines be  evaluated for need to be added again.      Ancil Boozer, MD  Electronically Signed     SA/MEDQ  D:  10/05/2007  T:  10/06/2007  Job:  [id number]   cc:   Lindaann Slough, M.D.  Eduardo Osier. Sharyn Lull, M.D.  Mina Marble, M.D.

## 2011-03-09 NOTE — Assessment & Plan Note (Signed)
[facility]                         ELECTROPHYSIOLOGY OFFICE NOTE   NAME:Vallas, HO PARISI                      MRN:          [id number]  DATE:05/14/2008                            DOB:          10/08/1930    Mr. Schmieder returns today for followup.  He is a very pleasant middle-  aged male with longstanding ischemic cardiomyopathy, congestive heart  failure, atrial fibrillation, left bundle-branch block who is status  post BiV ICD insertion back in January 2008.  I saw him 6 months ago and  he was in AFib and his OptiVol fluid index was elevated, we increased  his diuresis.  He went back spontaneously to sinus rhythm (he denies  cardioversion) and returns today for followup and overall is better.   CURRENT MEDICINES:  1. Potassium 20 mEq t.i.d.  2. Lipitor 40 a day.  3. Warfarin 5 mg as directed.  4. Centrum.  5. Multiple vitamin.  6. Inspra 25 mg twice a day.  7. Coreg CR 40 mg a day.  8. Furosemide 80 mg twice a day.  9. The patient had been on Altace previously, but this was      discontinued by Dr. Sharyn Lull.   PHYSICAL EXAMINATION:  GENERAL:  He is a pleasant well-appearing middle-  aged man, in no distress.  VITAL SIGNS:  Blood pressure was 146/72, pulse 64 and regular,  respirations were 18, and weight was 180 pounds.  NECK:  Revealed no jugular distention.  LUNGS:  Clear bilaterally auscultation.  No wheezes, rales, or rhonchi  are present.  CARDIAC:  Regular rate and rhythm.  Normal S1 and S2.  EXTREMITIES:  No edema.   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  The P-wave is 1.5, the R-wave is 8, the impedance is 536 in the  A, 408 in the RV, 448 in the LV, threshold is 1.2 in the atrium, in the  RV, and in the LV.  Battery voltage was 3 V.  Underlying rhythm was  sinus rhythm.  He was 98% V paced.   IMPRESSION:  1. Ischemic cardiomyopathy.  2. Congestive heart failure.  3. Status post  biventricular implantable cardioverter-defibrillator      insertion.  4. Dyslipidemia.   DISCUSSION:  Mr. Lorge is stable.  His defibrillator is working  normally.  His heart failure is fairly well-controlled.  OptiVol fluid  index is down today.  He is maintaining sinus rhythm very  nicely.  I will plan to see the patient back in a year.  I have  instructed him to call me if his heart failure worsens where he received  ICD shock.     Doylene Canning. Ladona Ridgel, MD  Electronically Signed    GWT/MedQ  DD: 05/14/2008  DT: 05/15/2008  Job #: [id number]   cc:   Eduardo Osier. Sharyn Lull, M.D.

## 2011-03-09 NOTE — Assessment & Plan Note (Signed)
[facility]                         ELECTROPHYSIOLOGY OFFICE NOTE   NAME:[name], [name]                      MRN:          [id number]  DATE:11/19/2008                            DOB:          05/07/1930    [name] returns today for followup, he is a very pleasant male with a  history of longstanding ischemic cardiomyopathy and congestive heart  failure, left bundle-branch block, he is status post Bi-V ICD insertion.  He returns today for followup.  The patient denies chest pain or  shortness of breath.  He does admit to some dietary indiscretion with  sodium.  He has not been to hospital since last we saw him a year ago.  He denies peripheral edema.   CURRENT MEDICATIONS:  1. Potassium 20 twice a day.  2. Trazodone daily.  3. Cymbalta daily.  4. Prilosec 20 a day.  5. Furosemide 80 twice a day.  6. Coreg CR 40 a day.  7. Inspra 25 twice a day.  8. Multivitamin.  9. Warfarin as directed.  10.Lipitor 40 a day.  11.Potassium  22 times a day.   PHYSICAL EXAM:  GENERAL:  He is a pleasant middle-aged man in no acute  distress.  VITAL SIGNS:  Blood pressure today was 130/70, the pulse 76 and regular,  respirations were 18.  Weight was 192 pounds up 4 pounds since his visit  in July.  NECK:  A 7-cm jugular venous distention.  There is no thyromegaly.  LUNGS:  Clear bilaterally except for minimal rales in the bases  bilaterally.  There is no increased work of breathing.  CARDIOVASCULAR:  Regular rate and rhythm.  Normal S1-S2 and a split S2.  The PMI was not enlarged and not displaced.  ABDOMINAL:  Soft,  nontender.  EXTREMITIES:  Demonstrate no edema.   Interrogation of his defibrillator demonstrates Medtronic InSync Sentry  implanted in January 2008.  The P waves were 2, the R waves were 8.  The  impedance is 552 in the atrium, 392 in the RV, 384 in the LV.  The  threshold was 2 at 0.1  in the atrium, 2.5 at 0.1 in the RV, and 2 at  0.6 in  the LV.  Battery voltage was 3 volts.  Review of the patient's  OptiVol fluid index demonstrated that he was past the OptiVol fluid  margin and actually pegged out,  indicated that there was extra fluid in  his pulmonary vasculature.   IMPRESSION:  1. Ischemic cardiomyopathy.  2. Congestive heart failure.  3. Status post biventricular  implantable cardioverter-defibrillator      insertion.  4. Dietary indiscretion and noncompliance.   DISCUSSION:  I have discussed the importance of watching his salt  intake.  I have asked that [name] increase his Lasix to 80 mg one  and half tablets twice daily for 3 days and then go back to 80 mg twice  daily.  I will see the patient back in our office in several months.     Doylene Canning. Ladona Ridgel, MD  Electronically Signed    GWT/MedQ  DD: 11/19/2008  DT: 11/20/2008  Job #: [id number]   cc:   Eduardo Osier. Sharyn Lull, M.D.

## 2011-03-11 ENCOUNTER — Encounter: Payer: Self-pay | Admitting: Internal Medicine

## 2011-03-11 ENCOUNTER — Other Ambulatory Visit: Payer: Self-pay | Admitting: Internal Medicine

## 2011-03-11 ENCOUNTER — Encounter: Payer: Self-pay | Admitting: *Deleted

## 2011-03-11 ENCOUNTER — Ambulatory Visit (INDEPENDENT_AMBULATORY_CARE_PROVIDER_SITE_OTHER): Payer: Medicare Other | Admitting: Internal Medicine

## 2011-03-11 DIAGNOSIS — I5022 Chronic systolic (congestive) heart failure: Secondary | ICD-10-CM

## 2011-03-11 DIAGNOSIS — I2589 Other forms of chronic ischemic heart disease: Secondary | ICD-10-CM

## 2011-03-11 DIAGNOSIS — Z95810 Presence of automatic (implantable) cardiac defibrillator: Secondary | ICD-10-CM

## 2011-03-11 DIAGNOSIS — I4891 Unspecified atrial fibrillation: Secondary | ICD-10-CM

## 2011-03-11 NOTE — Assessment & Plan Note (Signed)
His device is working normally but he is at [employer]. We'll schedule ICD generator change.

## 2011-03-11 NOTE — Assessment & Plan Note (Signed)
He is out of rhythm today. His ventricular rate appears to be well-controlled. He is pacing 97% of the time.

## 2011-03-11 NOTE — Assessment & Plan Note (Signed)
His current heart failure is class II. He'll continue his current medications and maintain a low-sodium diet.

## 2011-03-11 NOTE — Patient Instructions (Signed)
Your physician has recommended that you have your generator changed out

## 2011-03-11 NOTE — Progress Notes (Signed)
HPI Mr. Azam returns today for followup. He is a 77 year old man with an ischemic cardiomyopathy, congestive heart failure, left bundle branch block, status post ICD insertion. The patient has had no recurrent ICD shocks. He denies chest pain. His heart failure is class II. He has mild peripheral edema. Not on File   Current Outpatient Prescriptions  Medication Sig Dispense Refill  . atorvastatin (LIPITOR) 40 MG tablet Take 40 mg by mouth daily.        . carvedilol (COREG CR) 40 MG 24 hr capsule Take 40 mg by mouth daily.        . DULoxetine (CYMBALTA) 60 MG capsule Take 60 mg by mouth daily.        Marland Kitchen eplerenone (INSPRA) 25 MG tablet Take 25 mg by mouth 2 (two) times daily.        . furosemide (LASIX) 80 MG tablet Take 80 mg by mouth daily.       . Multiple Vitamin (MULTIVITAMINS PO) Take by mouth daily.        . potassium chloride SA (K-DUR,KLOR-CON) 20 MEQ tablet Take 20 mEq by mouth daily.       . ramipril (ALTACE) 10 MG capsule  1 tab qd      . TRAZODONE HCL PO Take 1 tablet by mouth at bedtime.        Marland Kitchen warfarin (COUMADIN) 5 MG tablet Use as directed by Anticoagulation Clinic          Past Medical History  Diagnosis Date  . Dyslipidemia   . CHF (congestive heart failure), NYHA class II   . Atrial fibrillation   . Prostate cancer     ROS:   All systems reviewed and negative except as noted in the HPI.   Past Surgical History  Procedure Date  . Medtronic biv icd 10/2006  . Pci/stent 02/2005  . Total knee arthroplasty      Family History  Problem Relation Age of Onset  . Diabetes Mother      History   Social History  . Marital Status: Married    Spouse Name: N/A    Number of Children: N/A  . Years of Education: N/A   Occupational History  . retired    Social History Main Topics  . Smoking status: Former Games developer  . Smokeless tobacco: Not on file   Comment: stopped smoking in 1970  . Alcohol Use: Not on file  . Drug Use: Not on file  . Sexually Active:  Not on file   Other Topics Concern  . Not on file   Social History Narrative  . No narrative on file     BP 149/89  Pulse 71  Ht 5\' 11"  (1.803 m)  Wt 203 lb (92.08 kg)  BMI 28.31 kg/m2  Physical Exam:  Well appearing NAD HEENT: Unremarkable Neck:  No JVD, no thyromegally Lymphatics:  No adenopathy Back:  No CVA tenderness Lungs:  Clear HEART:  Regular rate rhythm, no murmurs, no rubs, no clicks Abd:  Flat, positive bowel sounds, no organomegally, no rebound, no guarding Ext:  2 plus pulses, no edema, no cyanosis, no clubbing Skin:  No rashes no nodules Neuro:  CN II through XII intact, motor grossly intact DEVICE  Normal device function.  See PaceArt for details. He is at [facility].  Assess/Plan:

## 2011-03-12 NOTE — Discharge Summary (Signed)
[name], [name] NO.:  [id number]   MEDICAL RECORD NO.:  [id number]          PATIENT TYPE:  INP   LOCATION:  6529                         FACILITY:  [facility]   PHYSICIAN:  Doylene Canning. Jacob Ridgel, MD    DATE OF BIRTH:  05/30/1930   DATE OF ADMISSION:  11/07/2006  DATE OF DISCHARGE:  11/08/2006                               DISCHARGE SUMMARY   PRIMARY CARDIOLOGIST:  Dr. Eduardo Osier. [name].   PRIMARY ELECTROPHYSIOLOGIST:  Dr. Gilman [name].   PRINCIPAL DIAGNOSIS:  Ischemic cardiomyopathy.   SECONDARY DIAGNOSES:  1. Class III chronic congestive heart failure.  2. Coronary artery disease.  3. Hypertension.  4. Hyperlipidemia.  5. Anticoagulation.  6. History of prostate cancer with radiation seed implantation.  7. Status post knee replacement in the past.  8. History of hernia.   ALLERGIES:  No known drug allergies.   PROCEDURES:  Successful implantation of a Medtronic Insync Sentry model  [id number] biventricular ICD serial number [id number].   HISTORY OF PRESENT ILLNESS:  A 77 year old male with the above problem  list who was recently seen in clinic by Dr. Lewayne [name] on November 04, 2006 for consideration of biV ICD placement.  The patient was agreeable  and this was scheduled for November 07, 2006.   HOSPITAL COURSE:  [name] was admitted through the [facility]-  Stay area and underwent successful placement of a Medtronic Insync  Sentry model 7299 biV ICD as outlined above.  He tolerated this  procedure well and postprocedure chest x-ray shows no evidence of  pneumothorax.  He has been given post device procedure discharge  instructions and has followup arranged both in the [facility]  Device Clinic as well as with Dr. Ladona [name].  He is being discharged home  today in satisfactory condition.   DISCHARGE LABS:  PT 19.6, INR 1.6.   DISPOSITION:  The patient is being discharged home today in good  condition.   FOLLOWUP APPOINTMENT:  He has  followup with Dr. Sharyn [name] as previously  scheduled.  He is to follow up in the [facility] on  November 16, 2006 at 10:40 a.m.  He is follow up to see Dr. Gilman [name] in EP clinic on February 14, 2007 at 9:40 a.m.   DISCHARGE MEDICATIONS:  1. Plavix 75 mg daily to be resumed November 11, 2006.  2. K-Dur 20 mEq t.i.d.  3. Lipitor 40 mg daily.  4. Cymbalta 60 mg daily.  5. Coreg CR 80 mg daily.  6. Coumadin as previously prescribed.  7. Furosemide 80 mg daily.  8. Protonix 40 mg daily.  9. Altace 10 mg daily.  10.Allopurinol 100 mg daily.  11.Metolazone 5 mg daily.  12.Inspra 25 mg b.i.d.  13.Multivitamin 1 daily.   OUTSTANDING LAB STUDIES:  None.  Duration discharge encounter 40 minutes  including physician time.      [name], ANP      Doylene Canning. Jacob Ridgel, MD  Electronically Signed    CB/MEDQ  D:  11/08/2006  T:  11/08/2006  Job:  [id number]   cc:  Jacob Osier. [name], M.D.

## 2011-03-12 NOTE — Cardiovascular Report (Signed)
[name], [name]               ACCOUNT NO.:  [id number]   MEDICAL RECORD NO.:  [id number]          PATIENT TYPE:  INP   LOCATION:  4733                         FACILITY:  [facility]   PHYSICIAN:  Mohan N. Sharyn Lull, M.D. DATE OF BIRTH:  1929/11/07   DATE OF PROCEDURE:  03/04/2005  DATE OF DISCHARGE:                              CARDIAC CATHETERIZATION   PROCEDURE:  1.  Left cardiac catheterization with selective left and right coronary      angiography, LV graft via right groin using Judkins technique.  2.  Insertion of intra-aortic balloon pump via left groin .  3.  Successful PTCA to proximal and ostial LAD using 3.0 x 12 mm long      Voyager balloon.  4.  Successful deployment of 3.5 x 23 mm long Cypher drug-eluting stent in      ostial and proximal LAD.   INDICATIONS FOR PROCEDURE:  [name] is a 77 year old black male with past  medical history significant for hypertension, CA of prostate, remote history  of tobacco abuse. Went to [facility] because of  progressively increasing shortness of breath and chest congestion and was  noted to have questionable bilateral pneumonia/CHF with hypoxemia and was  transferred to [facility] and was noted to have significantly  elevated CPK-MB and troponin I. The patient denies any chest pain but  complains of chest congestion associated with shortness of breath and  diaphoresis. The patient denies any palpitation, lightheadedness or syncope.  Denies PND, orthopnea, leg swelling. The patient denies any fever, chills,  but complains of cough with whitish yellow phlegm. The patient denies any  history of exertional chest pain. EKG in the ER showed left bundle branch  block. The patient was ruled in for non-Q-wave MI due to significantly  elevated troponin I and CPK MBs. The patient was treated with IV Lovenox,  nitrates and with diuretics and antibiotics with improvement in symptoms.  Due to recent non-Q-wave MI  complicated by CHF multiple risk factors,  discussed with the patient and his wife regarding left cath, possible PTCA  stenting, its risks and benefits i.e. death, stroke, need for emergency  CABG, risk of restenosis, local vascular complications, etc., and consented  for PCI.   PROCEDURE:  After obtaining informed consent, the patient was brought to the  cath lab and was placed on fluoroscopy table. The right and left groin was  prepped and draped in usual fashion. 2% Xylocaine was used for local  anesthesia in the right groin. With the help of thin-wall needle and 6-  French arterial and 6-French venous sheaths were placed. Both the sheaths  were aspirated and flushed. Next, a 6-French left Judkins catheter was  advanced over the wire under fluoroscopic guidance up to the ascending  aorta. Wire was pulled out and the catheter was aspirated and connected to  the manifold. Catheter was further advanced and engaged into left coronary  ostium. Multiple views of the left system were taken. Next, the catheter was  disengaged and was pulled out over the wire was replaced with 6-French right  Judkins catheter which was advanced over the wire under fluoroscopic  guidance up to the ascending aorta. Wire was pulled out. The catheter was  aspirated and connected to the manifold. Catheter was further advanced and  engaged into right coronary ostium. Multiple views of the right system were  taken. Next, catheter was disengaged and was pulled out over the wire and  was replaced with 6-French pigtail catheter which was advanced over the wire  under fluoroscopic guidance up to the ascending aorta. Wire was pulled out  and the catheter was aspirated and connected to the manifold. Catheter was  further advanced across aortic valve into the LV. LV pressures were  recorded. Next LV graft was done in 30 degree RAO position. Post  angiographic pressures recorded from LV and then pullback pressures were   recorded from the aorta. There was no gradient across aortic valve. Next a  pigtail catheter was pulled out. Sheaths aspirated and flushed.   FINDINGS:  LV showed anteroapical and inferoapical wall akinesia and rest of  wall global hypokinesia, EF of 30-35%. The left ventricular end-diastolic  pressures were approximately 40 mmHg. Left main was patent. LAD has 90%  ostial and proximal stenosis with thrombus and haziness with TIMI II distal  flow with post stenotic dilatation. Diagonal one was 100% occluded. Diagonal  II and III were very small, less than 1.5 mm with 75-80% proximal and mid  stenosis. Ramus has 40-50% mid stenosis. Left circumflex is patent in  proximal and midportion and then tapers down in AV groove. OM1 is large  which bifurcates into superior and inferior branch which were patent. RCA  was patent. Intra-aortic balloon pump was inserted via left femoral arterial  approach prior to PCI with good diastolic augmentation of blood pressure.   INTERVENTIONAL PROCEDURE:  Successful PTCA to proximal and ostial LAD was  done using 3.0 x 12 mm long Voyager balloon for predilatation and then 3.5 x  23 mm long Cypher drug-eluting stent was deployed at 11 atmospheres of  pressure which was fully expanded going up to 18 atmospheres of pressure.  Lesion was dilated from 99% with thrombus to 0% residual with excellent TIMI  grade III distal flow without evidence of dissection or distal embolization.  The patient received weight-based heparin and Diprivan and 600 milligrams of  Plavix during the procedure. The patient tolerated procedure well. There  were no complications. The patient diuresed approximately 1500 cc during the  procedure. The patient was transferred to recovery room in stable condition.      MNH/MEDQ  D:  03/07/2005  T:  03/07/2005  Job:  [id number]   cc:   Catheter Lab   Jacob Farber., M.D.  [address] [place]  [place] [address]  Fax: [phone]

## 2011-03-12 NOTE — Assessment & Plan Note (Signed)
[facility]                         ELECTROPHYSIOLOGY OFFICE NOTE   NAME:JOYNERPerley, Arthurs                      MRN:          [id number]  DATE:02/14/2007                            DOB:          May 24, 1930    SUBJECTIVE:  Mr. Wingard returns today for followup.  He is a very  pleasant 77 year old man, with a history of congestive heart failure,  left bundle branch block and an ischemic cardiomyopathy, who underwent a  BiV ICD implantation back in January.  He returns today for followup.  He states that his breathing has improved.  He denies chest pain.  He  denies shortness of breath.  He denies peripheral edema.   CURRENT MEDICATIONS:  1. Potassium.  2. Plavix.  3. Lipitor.  4. Cymbalta.  5. Coreg.  6. Furosemide.  7. Warfarin.  8. Protonix.  9. Altace.  10.Allopurinol.  11.Metolazone.  12.Aspirin.   PHYSICAL EXAMINATION:  GENERAL:  A pleasant, well-appearing elderly man,  in no distress.  VITAL SIGNS:  Blood pressure 126/70, pulse 70 and regular, respirations  18, weight 178 pounds.  NECK:  No jugular venous distention.  LUNGS:  Clear bilaterally to auscultation.  No wheezes, rales or  rhonchi.  CARDIOVASCULAR:  A regular rate and rhythm with a normal S1 and S2.  EXTREMITIES:  Demonstrated no edema.  The incision was healed nicely.   Interrogation of his defibrillator demonstrates a Medtronic Sentry with  P-waves and R-waves of 2 and 11 respectively, the impedance of 4 in the  atrium and 44 in the right ventricle, 48 in the left ventricle.  Threshold of 1 volt at 0.2, both in the right atrium and the right  ventricle and in the left ventricle.  Battery voltage is 3.16 volts.  Today we turned his outputs down from 3 to 2 at 0.4 in the atrium, 2.5  at 0.4 in the right ventricle and 2 at 0.4 in the left ventricle.   IMPRESSION:  1. Ischemic cardiomyopathy.  2. Congestive heart failure.  3. Left bundle branch block.  4. Status post BiV  implantable cardioverter defibrillator.   DISCUSSION:  Overall Mr. Gasparyan is stable.  I mentioned to him that it  would be a consideration to decrease some of his diuretics, but I will  defer the ultimate decision on this to Dr. Eduardo Osier. Harwani, who is his  primary cardiologist.   FOLLOWUP:  1. We will see him back in the ICD Clinic with me in January 2009.  2. He will follow up with [facility].     Doylene Canning. Ladona Ridgel, MD  Electronically Signed    GWT/MedQ  DD: 02/14/2007  DT: 02/14/2007  Job #: [id number]   cc:   Eduardo Osier. Sharyn Lull, M.D.  Mina Marble, M.D.

## 2011-03-12 NOTE — Assessment & Plan Note (Signed)
[facility]                         ELECTROPHYSIOLOGY OFFICE NOTE   NAME:Enns, [name]                      MRN:          [id number]  DATE:11/16/2006                            DOB:          1930/09/19    [name] was seen today in the clinic, on November 16, 2006, for a  wound-check of his newly-implanted Medtronic, Model number [address] Sentry.  Date of implant was November 07, 2006, for ischemic cardiomyopathy.   On interrogation of his device today, his battery voltage is 3.14 with a  charge time of 8.69 seconds.  P-waves measured 1.9 millivolts with an  atrial capture threshold of 1 volt at 0.2 milliseconds and an atrial  lead impedance of 512 ohms.  R-waves measured 8.1 millivolts with a  right ventricular pacing threshold and left ventricular pacing  threshold, both at 1 volt at 0.2 milliseconds with a right left  ventricular lead impedance of 440 ohms and a left ventricular lead  impedance of 504 ohms.  Shock impedance was 40.  There was one  nonsustained episode since implant date.   No changes were made in his parameters.  He will be seen again in April  by Dr. Ladona Ridgel.      Altha Harm, LPN  Electronically Signed      Doylene Canning. Ladona Ridgel, MD  Electronically Signed   PO/MedQ  DD: 11/16/2006  DT: 11/16/2006  Job #: [id number]

## 2011-03-12 NOTE — H&P (Signed)
[place]  Patient:    [name], [name] Visit Number: [id number] MRN: [id number]          Service Type: Attending:  Reynolds Bowl, M.D. Dictated by:   Reynolds Bowl, M.D. Adm. Date:  02/28/02                           History and Physical  DATE OF BIRTH: 07-Nov-1929  INTRODUCTION: This is a 77 year old man with painful knees over many years, especially the right side.  He has noted gradually increasing bowing of his knees corresponding with a gradual increasing pain of his knees.  It makes it difficult for him to negotiate stairways, squat, etc.  He has been seen as an outpatient and found to have tricompartment osteoarthritis with varus knees. Total knee arthroplasty fully discussed, usual course, possible complications to include phlebitis, infection, bleeding, limited motion, pain, dislocation, etc., and the patient has elected to proceed on with same.  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  ALLERGIES: None known.  MEDICATIONS:  1. Allopurinol 100 mg q.d.  2. Doxazosin 4 mg q.d.  3. Indapamide 1.25 mg q.d.  4. Norvasc 10 mg q.d.  5. Prinivil 20 mg q.d.  6. Metoprolol 50 mg q.d.  7. Potassium chloride 20 mEq q.d.  PAST SURGICAL HISTORY: Prostate surgery and radiation seeds in 2000.  REVIEW OF SYSTEMS: His most recent prostate evaluation apparently was normal and his PSA was 1.01.  He also has a history of gout and hypertension.  Feels that he is not having any significant cardiorespiratory, GI, or GU symptoms except as noted above.  PHYSICAL EXAMINATION:  VITAL SIGNS: Height 5 feet 11 inches.  Weight 228 pounds.  Temperature 98.1 degrees, pulse 72, blood pressure 142/70, respirations 12.  HEENT: He wears glasses.  Extraocular movements full.  Tympanic membranes have normal light reflex.  Pharynx clear.  He has upper and lower partial dentures.  NECK: Moves comfortably.  No palpable masses.  No carotid  bruits.  CHEST: Breath sounds are somewhat decreased but clear.  HEART: Regular rhythm.  No murmurs heard.  ABDOMEN: Round, soft, nontender.  No palpable masses.  Bowel sounds are present.  ORTHOPEDIC: Evaluation discloses good femoral pulse without bruit, normal dorsalis pedis pulse.  Patient walks with varus knees.  Motion in the right knee is 15-110.  There is generalized crepitation.  There is tenderness medially.  No significant laxity.  No evidence of leg edema.  Hip motion is normal.  LABORATORY DATA: X-rays show tricompartment osteoarthritis with varus deformity.  PLAN: Plan will be right total knee arthroplasty, cemented.  I have fully discussed all the possible complications, the fact there are no guarantees. The patient would like to proceed on with same. Dictated by:   Reynolds Bowl, M.D. Attending:  Reynolds Bowl, M.D. DD:  02/28/02 TD:  03/01/02 Job: [id number]

## 2011-03-12 NOTE — Discharge Summary (Signed)
[name], [name] NO.:  [id number]   MEDICAL RECORD NO.:  [id number]          PATIENT TYPE:  INP   LOCATION:  4733                         FACILITY:  [facility]   PHYSICIAN:  Mina Marble, M.D.DATE OF BIRTH:  1930/05/27   DATE OF ADMISSION:  02/26/2005  DATE OF DISCHARGE:  03/09/2005                                 DISCHARGE SUMMARY   OPERATION/PROCEDURE:  On Mar 04, 2005, cardiac catheterization.   ADMISSION DIAGNOSES:  1.  Pneumonia, extensive bilateral bases with hypoxia.  2.  Ileus, colonic.  3.  Hypertensive  heart disease, controlled.  4.  Degenerative joint disease, especially knees.  5.  Gout, history of.  6.  Prostate cancer, status post radiation and implants.  7.  Congestive heart failure, early clinically.   DISCHARGE DIAGNOSES:  1.  Pneumonia bases, resolved.  2.  Congestive heart failure, resolving.  3.  Ileus, colonic.  4.  Hypertensive heart disease.  5.  Acute non-Q wave myocardial infarction, resolving.  6.  Hypokalemia.  7.  History of glucose intolerance and impaired.  8.  Degenerative joint disease, especially knees.   REASON FOR HOSPITALIZATION:  The patient is a 76 year old black male with  known history of hypertensive heart disease, status post radiation implant  treatment for prostate cancer, who was admitted to [facility] ED for bilateral bases pneumonia and moderate abdominal distention.  Initially diagnosed at [facility]   ALLERGIES:  No known drug allergies.   LABORATORY DATA:  Transthoracic echocardiogram on Mar 01, 2005 showed left  ventricular size was normal.  Overall left ventricular systolic pressure  mildly to moderately decreased.  Left ventricular ejection fraction  estimated range of 35-45%.  There appeared to be akinesis of the periapical  wall, finding suggestive of moderate hypokinesis of the mid distal inferior  posterior wall.  The aortic valve is mildly  calcified.  There was trivial  aortic valvular regurgitation.  There is mild ascending aortic dilation.  EKG on May 8, showed sinus rhythm with premature atrial complexes.  CT scan  of the abdomen and pelvis showed bilateral lower lobe atelectasis versus  pneumonia with small bilateral pleural effusion and thickening_    Abdominal x-ray showed slight interval decrease in colonic distention.  On  May 8, chest x-ray showed interstitial pulmonary edema with slight elevation  of left hemidiaphragm and mild left basilar atelectasis.  On Feb 27, 2005,  ABG showed a pH of 7.423, PCO2 of 37, PO2 of 57, mmHg___  24.7.  CBC showed  WBC of 14.3, hemoglobin 12.6, hematocrit 37, platelets 151.  On May 15, WBC  was 7.3, hemoglobin 9.1, hematocrit 33, platelets 313.  On Feb 27, 2005 D-  dimer was 0.86.  On Feb 26, 2005, chemistry was within normal limits and  glucose was 128.  AST 53, total bilirubin 12.6.  On May 15, chemistry was  within normal limits and glucose was 106.  On May 16, hemoglobin A1C was  6.6.  On Feb 27, 2005, cardiac enzymes first set, CK 675, CK-MB 55.8,  relative index 8.3,  troponin 9.72. B natriuretic peptide was normal__  4.1.  Second set of cardiac enzymes,  CK 846, CK-MB 44.4, relative index 5.2,  troponin I was 15.63.  The third set showed CK 859, CK-MB 32.1, relative  index 3.7, troponin I was 15.35.  On May 15, CK was 82, CK-MB was 1.9, _,  troponin was 2.13.  On Feb 27, 2005, lipid profile showed cholesterol 162,  triglycerides 81, HDL 44, LDL 102.  On May 6, blood cultures showed no  growth after five days.  On May 12, blood cultures showed no growth after  five days.  On May 12, urine culture showed no growth after one day.   HOSPITAL COURSE:  The patient was admitted to the telemetry bed.  Blood  cultures were obtained and urine culture was obtained.  This came to be  negative.  The patient had a BMP drawn with cardiac enzymes which were  elevated and showed that the patient  had a myocardial infarction.  The  patient was started on Zosyn 3.75 g IV q.8 h. for his extensive pneumonia.  IV therapy also initiated.  The patient had some complaints of constipation.  There had been a distended abdomen which showed an ileus.  Lactulose 30 cc  p.o. q. a.m. and q.h.s. was initiated.  The patient was also started on  Reglan 5 mg IV a.c. and h.s. The patient was started on home medications of  allopurinol, lisinopril, indapamide, K-Dur.  Also added to the plan of care  was Coreg 3.125 mg p.o. q.d.  On May 6, the patient had severe shortness of  breath.  The patient was started on Lasix 60 mg IV, D-dimer, cardiac enzymes  and the patient was placed on BiPAP and pharmacy was consulted to dose  Lovenox for possible PE.  The patient was given Lanoxin 0.25 IV.  A Foley  was inserted.  The patient was transferred to ICU bed.  The patient was  started on Protonix 40 mg IV daily. Also he was started on enteric-coated  aspirin 325 mg p.o. daily.  His cardiac enzymes were repeated.  His stools  were obtained for occult blood which were negative.  The patient was started  on a full-liquid, no-added-salt, no caffeine  diet.  He was also started on  Mylicon 80 tabs two t.i.d. with meals and at bedtime.  His albuterol and  Atrovent was discontinued and he was started on Xopenex 1.25 mg q.6 h. He  was started on Lovenox 105 mg subcutaneous q.12 h. and he was started on  Lasix 60 mg IV q.8 h.  The Coreg was increased to 6.25 mg b.i.d. and his  Reglan was also increased to 10 mg IV a.c. meals and at night.  The patient  had an episode of hypokalemia.  Potassium supplements were initiated  intravenously.  Due to the patient's tachycardia, the patient was started on  Cardizem bolus 10 mg drip to titrate for a heart rate greater than 120 and  systolic blood pressure greater than 110.  Digoxin 0.125 mg IV was given  daily.  The patient was given a Fleets enema.  Coreg continued be increased to  12.5 p.o. q.12 h.  The patient's potassium was evaluated and the patient  continued her hypokalemia and the patient was given supplement.  Intravenously.  A 2-D echo was obtained.  The patient was started on Lipitor  40 mg p.o. daily. His diet was advanced to a low-cholesterol, low-fat, no-  added-salt diet  and his Reglan was changed to 10 mg p.o.  His Lanoxin was  changed to 0.125 mg q.o.d., lactulose  30 mL q.a.m. and at night p.r.n.   A cardiology consult was done by Dr. Sharyn Lull and the patient was started on  a heparin protocol and Lovenox was discontinued.  His Coreg was increased to  25 mg q.12 h.  The patient was restarted on lactulose 30 mL p.o. at night  p.r.n. dose.  He has received an additional Fleets enema.  Heparin drip was  monitored by daily PT and PTT and _treat_  to heparin protocol.  His Lasix  was decreased to 40 mg IV daily. Potassium supplements were given orally.  The patient received a cardiac catheterization on Mar 04, 2005.  He had a  PTCA performed by Dr. Sharyn Lull.  The patient was discontinued from heparin  after the PTCA.  No stent.  He will continue on Plavix and enteric-coated  aspirin.   The patient had a temperature on Mar 05, 2005. The on-call drew a sputum  culture was obtained and blood culture.  The patient was started on  vancomycin per pharmacy protocol.  PICC line was inserted.  The patient's  Foley catheter was discontinued.  Digoxin was discontinued and lisinopril  was changed to Altace 10 mg p.o. b.i.d.  we  weaned him off the Nitro drip.  The patient was changed to Lasix 40 mg p.o. daily.  Regular durectic  treatment was discontinued and was given p.r.n.  Eventually the patient was  taken off vancomycin and discontinued Aldactone.  He was scheduled for  outpatient cardiac rehab, treatment____  phase 2.   The patient had a gastrointestinal consultation done by Dr. Virginia Rochester due to his  constipation and colonic ileus.  The patient had a large bowel  movement and  was passing flatus.  He will see Dr. Virginia Rochester on an outpatient basis.   DISCHARGE CONDITION:  The patient continued to improve her pneumonia and no  shortness of breath or chest pain.  Temperature was 98.  The patient is  discharged home in stable condition.   DISCHARGE MEDICATIONS:  1.  Enteric-coated aspirin 81 mg two tabs daily.  2.  Plavix 75 mg tabs daily.  3.  Coreg 25 mg tablets one q.12 h.  4.  Altace 10 mg one b.i.d.  5.  Inspra 25 mg tabs one b.i.d.  6.  Lipitor 40 mg tablet one daily.  7.  Nitrostat 0.04 mg sublingual. Use as directed.  8.  Allopurinol 100 mg one daily.  9.  cKala balata __  one-half tablet q.h.s.  10. Reglan 10 mg a.c. and at night.  11. Nexium 40 mg one daily.  12. Lasix  40 mg tab daily.  13. K-Dur 20 mEq tab one b.i.d.   DISCHARGE INSTRUCTIONS:  1.  For pain management, Tylenol 650 mg one q.4 h. p.r.n. for pain.  2.  Activity and ambulate as tolerated.  3.  No lifting over 15 pounds.  4.  Diet:  No concentrated _sweet , no-added-salt diet.   FOLLOW UP:  To  Dr. Consuello Bossier office April 06, 2005 at 3:30 p.m.      Dionne D. Retta Mac, M.D.  Electronically Signed    DDR/MEDQ  D:  05/16/2005  T:  05/17/2005  Job:  [id number]   cc:   Georgiana Spinner, M.D.  [address]  [place]  [place] [address]  Fax:  [phone]   Eduardo Osier Harwani, M.D.  [address]     [address]  [place]  [place] [address]  Fax: [phone]

## 2011-03-12 NOTE — Op Note (Signed)
[name], [name] NO.:  [id number]   MEDICAL RECORD NO.:  [id number]          PATIENT TYPE:  INP   LOCATION:  2899                         FACILITY:  [facility]   PHYSICIAN:  Doylene Canning. Ladona Ridgel, MD    DATE OF BIRTH:  [birth year]   DATE OF PROCEDURE:  11/07/2006  DATE OF DISCHARGE:                               OPERATIVE REPORT   PROCEDURE PERFORMED:  BiV ICD implantation with defibrillation threshold  testing.   INTRODUCTION:  The patient is a very pleasant 77 year old old male with  a history of ischemic cardiomyopathy and congestive heart failure (class  III) with left bundle branch block who despite maximal medical therapy  has continued to have severe heart failure.  He is now referred for BiV  ICD implantation.   PROCEDURE:  After informed was obtained, the patient was taken to the  diagnostic EP lab in the fasting state.  After usual preparation and  draping, intravenous fentanyl and midazolam was given for sedation.  Thirty mL of lidocaine was infiltrated over left infraclavicular region.  A 9-cm incision was carried out over this region.  Electrocautery  utilized to dissect down to the fascial plane.  The left subclavian vein  was subsequently punctured x3, and the Medtronic model 5076 52-cm active  fixation pacing lead, serial number [id number], was advanced to the  right atrium; and the [employer] model [phone] active  fixation defibrillation lead, serial number [id number], was advanced to  the right ventricle.  Mapping was carried out in the right ventricle at  the final site on the RV septum.  The R-waves measured 13.  The pacing  impedance was 700 ohms and threshold 0.7 volts at 0.5 milliseconds.  Ten-  volt pacing in this location did not stimulate the diaphragm.  Next,  attention was turned to placement of atrial lead which was placed in the  anterolateral region of the right atrium where P-waves were three and  the pacing impedance  measured 588 ohms and threshold 0.8 volts at 0.5  milliseconds.  Ten-volt pacing again did not stimulate the diaphragm.  With both the atrial and the RV leads in satisfactory position,  attention was then turned to placement of the left ventricular lead.  The Medtronic MB2 guiding catheter was advanced along with the 6-French  EP hexapolar catheter into the right atrium, and the coronary sinus was  cannulated without particular difficulty.  Venography was carried out in  the coronary sinus using utilizing a total of 10 mL of contrast.  This  demonstrated a lateral vein as well as a posterior lateral vein.  The  lateral vein was selected for LV cannulation.  The Medtronic bipolar LV  pacing lead (model 4194, serial number [id number]) was advanced by way  of the guiding catheter along with a floppy guidewire into the coronary  sinus.  The lateral vein was cannulated without difficulty and the lead  advanced into the vein at approximately halfway from base to apex.  In  this location, the LV waves were 16 and the pacing impedance 935 ohms  with threshold of 0.5  volts at 0.5 milliseconds.  Ten-volt pacing not  stimulate the diaphragm.  With these satisfactory parameters, the leads  were secured to subpectoralis fascia with a figure-of-eight silk suture.  The sewing sleeve was also secured with silk suture.  Electrocautery was  utilized to make a subcutaneous pocket.  The Medtronic InSync Sentry  model 7299 BiV ICD serial number [id number] was connected to the atrial  RV and LV leads and placed back in the subcutaneous pocket.  Defibrillation threshold testing was carried out.   After the patient was more deeply sedated with fentanyl and Versed, VF  was induced with a T-wave shock, and a 50-joules shock was delivered  which terminated VF and restored sinus rhythm.  Five minutes was allowed  to elapse and second defibrillation threshold test carried out.  Again,  VF was induced with T-wave  shock, and again a 50-joules shock was  delivered which terminated VF and restored sinus rhythm.  At this point,  no additional defibrillation threshold testing was carried out, and the  incision was closed with a layer of 2-0 Vicryl, followed by a layer of 3-  0 Vicryl, followed by layer of 4-0 Vicryl.  Benzoin was painted on the  skin, Steri-Strips were applied and a pressure dressing was placed, and  the patient was returned to his room in satisfactory condition.   COMPLICATIONS:  There were no immediate procedure complications.   RESULTS:  This demonstrates successful implantation of a Medtronic  biventricular ICD in a patient with an ischemic cardiomyopathy, class  III heart failure, left bundle branch block despite maximal medical  medicines.      Doylene Canning. Ladona Ridgel, MD  Electronically Signed     GWT/MEDQ  D:  11/07/2006  T:  11/08/2006  Job:  [id number]   cc:   Eduardo Osier. Sharyn Lull, M.D.  Mina Marble, M.D.

## 2011-03-12 NOTE — Op Note (Signed)
[place]. [facility]  Patient:    [name], [name]                      MRN: [id number] Proc. Date: 01/12/01 Adm. Date:  [id number] Disc. Date: [id number] Attending:  Sonda Primes                           Operative Report  PREOPERATIVE DIAGNOSIS:  Right inguinal scrotal hernia.  POSTOPERATIVE DIAGNOSIS:  Right inguinal scrotal hernia.  PROCEDURE:  Right inguinal herniorrhaphy with mesh.  SURGEON:  Mardene Celeste. Lurene Shadow, M.D.  ASSISTANT:  Marnee Spring. Wiliam Ke, M.D.  ANESTHESIA:  General.  BRIEF HISTORY:  This patient is a 77 year old man with a very large right inguinal hernia protruding down into the scrotum.  The hernia is partially incarcerated and can be reduced only partially.  He comes to the operating room now for repair.  DESCRIPTION OF PROCEDURE:  Following the induction of anesthesia with the patient positioned supinely the right groin is prepped and draped to be included in the sterile operative field.  I made a transverse incision in the lower abdominal crease and deepened through the skin and subcutaneous tissue down to the external oblique aponeurosis.  External oblique aponeurosis opened through the external inguinal ring.  The large spermatic cord is elevated and held with a Penrose drain and through milking through the scrotum upward I was able to reduce the hernial contents back into the perineal cavity.  The large indirect hernial sac was then dissected free from the cord contents carrying the dissection up to the internal ring.  The sac was opened. All the visceral content was returned into the peritoneal cavity and the sac was then doubly ligated with 2-0 silk sutures.  The redundant sac was amputated and forwarded for pathologic evaluation.  The hernia was then repaired with an onlay patch of Marlex mesh sewn into the pubic tubercle, carried up along the conjoint tendon to the internal ring and again sewn from the pubic tubercle  along the shelving edge of Pouparts ligament up to the internal ring.  The tails of the mesh were split so as to surround the cord and then sutured into the internal oblique muscles.  The resulting repair allowed the tip of the hemostat to be entered into the internal ring.  Sponge, instrument and sharp counts were verified.  The wound was then closed in layers using interrupted 2-0 Vicryl sutures to close the internal oblique aponeurosis thus reapproximating the spermatic cord. Scarpas fascia was closed with a running 3-0 Vicryl suture and the skin was closed with a running 4-0 Monocryl suture and then reinforced with Steri-Strip.  Sterile dressings applied.  Anesthetic reversed.  Patient removed from the operating room to the recovery room in stable condition.  He tolerated the procedure well. DD:  01/12/01 TD:  01/12/01 Job: [id number]

## 2011-03-12 NOTE — Assessment & Plan Note (Signed)
[facility]                         ELECTROPHYSIOLOGY OFFICE NOTE   NAME:JOYNERShmiel, Morton                      MRN:          [id number]  DATE:11/04/2006                            DOB:          1930/04/22    REFERRING PHYSICIAN:  Eduardo Osier. Sharyn Lull, M.D.   Mr. Zeiner referred today by Dr. Sharyn Lull for consideration for a Bi-V  ICD implantation.   HISTORY OF PRESENT ILLNESS:  The patient is a very pleasant 77 year old  man with a longstanding history of coronary disease, status post bypass  surgery, with a history of congestive heart failure.  He is on maximum  medical therapy.  His last stent procedure was done back in 2006.  His  heart failure has varied in the past, at times as bad as Class IV, at  times as good as Class II.  He has been very heavily up titrated on his  medications.  He denies syncope.  He now has just very mild peripheral  edema.  He denies palpitations.  His additional past medical history is  notable for a history of hernia, history of prostate cancer with  radiation and seed implants, history of knee replacement in the past.   MEDICATIONS:  Include potassium, Inspir, Plavix, Lipitor, Cymbalta,  Coreg, Furosemide, Warfarin, Protonix, Altace, Allopurinol, Metolazone.   SOCIAL HISTORY:  The patient is married.  He is retired.  He denies  tobacco or ethanol abuse, though he did smoke until 1970.   FAMILY HISTORY:  Notable for both parents being deceased, his mother of  complications of diabetes, his father of unknown causes.   REVIEW OF SYSTEMS:  Notable for chronic arthritic complaints, history of  problems with shortness of breath, history of hernia and hiatal hernia  problem.  He also has a history of difficulty with urination and  depression.  Other his review of systems was reviewed and found to be  negative.   PHYSICAL EXAMINATION:  On physical examination, he is a pleasant, well-  appearing man in no acute distress.  The  blood pressure today was  114/69, the pulse was 72 and regular, respirations were 18.  The weight  was not recorded.  HEENT:  Normocephalic and atraumatic.  Pupils equal and round,  oropharynx was clear, eyes anicteric.  NECK:  Revealed no jugular venous distention.  There is no thyromegaly.  Trachea is midline.  Carotids are 2+ and symmetric.  LUNGS:  Clear bilaterally to auscultation.  There were no wheezes, rales  or rhonchi.  CARDIAC:  Regular rate and rhythm with normal S1 and S2.  There is a  soft S3 gallop present.  The PMI was enlarged and laterally displaced.  ABDOMEN:  Soft, nontender, nondistended.  There is no organomegaly.  The  bowel sounds are present.  There was no rebound or guarding.  NEUROLOGIC:  Alert and oriented x3.  Cranial nerves intact.  Strength  was 5/5 and symmetric.   EKG demonstrates sinus rhythm with first degree AV block, left bundle  branch block and frequent PVCs.  Q-wave restoration was 166  milliseconds.   IMPRESSION:  1. Ischemic cardiomyopathy.  2. Congestive heart failure Class III, though variable at times.  3. Dyslipidemia.  4. Hypertension.  5. Chronic Coumadin therapy.   DISCUSSION:  I have recommended that Mr. Mogan be referred for Bi-V ICD  implantation.  The risks, benefits, goals, and expectations of the  procedure have been discussed with the patient and he wishes to proceed.     Doylene Canning. Ladona Ridgel, MD  Electronically Signed    GWT/MedQ  DD: 11/04/2006  DT: 11/04/2006  Job #: [id number]   cc:   Eduardo Osier. Sharyn Lull, M.D.

## 2011-03-12 NOTE — Op Note (Signed)
[facility]  Patient:    [name], [name]                      MRN: [id number] Proc. Date: 05/29/01 Adm. Date:  [id number] Attending:  Lindaann Slough CC:         Maryln Gottron, M.D.  Eino Farber., M.D.   Operative Report  PREOPERATIVE DIAGNOSIS:  Adenocarcinoma of prostate.  POSTOPERATIVE DIAGNOSIS:  Adenocarcinoma of prostate.  PROCEDURE DONE:  I-125 seeds implantation and cystoscopy.  SURGEON:  Lindaann Slough, M.D.  ASSISTANT:  Chipper Herb, M.D.  ANESTHESIA:  General.  INDICATION:  The patient is a 77 year old male, who was found an elevated PSA. Prostate biopsy was positive for adenocarcinoma, Gleason score 7.  The patient chose to have combination external beam and seeds implantation.  He already had the external beam radiation treatment.  He is scheduled today for seeds implantation.  The patient had a previous ultrasound of the prostate done for dosimetry.  The transducer was then inserted in the rectum.  An ultrasound of the prostate was done.  Once the images obtained on ultrasound were similar to the ones obtained on the simulation ultrasound.  The grid was put in place, and the anchors were placed in the prostate.  Then, the seeds were inserted in the prostate.  A total of 94 seeds were inserted.  There was a good distribution of the seeds.  Then, the Foley catheter that was previously inserted in the bladder was removed.  A flexible cystoscope was passed in the bladder.  There was no evidence of seeds in the bladder.  There is no stone or tumor in the bladder.  The ureteral orifices are in normal position and shape with clear efflux.  The cystoscope was removed.  The patient tolerated the procedure well and left the OR in satisfactory condition to postanesthesia care unit. DD:  05/29/01 TD:  05/29/01 Job: [id number]

## 2011-03-12 NOTE — Op Note (Signed)
[place]. [facility]  Patient:    [name], [name] Visit Number: [id number] MRN: [id number]          Service Type: SUR Location: [id number] Attending Physician:  Maryanna Shape Dictated by:   Reynolds Bowl, M.D. Proc. Date: 03/06/02 Admit Date:  03/06/2002                             Operative Report  PREOPERATIVE DIAGNOSIS:  Osteoarthritis of right knee with varus.  POSTOPERATIVE DIAGNOSIS:  Oxygen saturation of right knee with varus.  OPERATIVE PROCEDURE:  Cemented right total knee arthroplasty.  SURGEON:  Reynolds Bowl, M.D.  ASSISTANT:  Humberto Leep. Wyonia Hough, M.D.  ANESTHESIA:  General.  DESCRIPTION OF PROCEDURE:  The patient had an IV started.  He was given 2 g of Ancef IV.  He was taken to the operating room and placed in the supine position.  He was given a general anesthetic.  A Foley catheter was put in place.  Rolls were placed under the right hip.  Foot holders and supports were applied to the bed.  A proximal pneumatic tourniquet was applied and then isolated with a U-drape.  From the edges of the U-drape to the tips of the toes, he was prepped with DuraPrep.  Following this, he was draped in the usual manner.  This includes the use of two Vi-Drapes.  Prior to the Vi-Drapes, the skin and anatomy were marked with ink.  A straight anterior incision was made with slight obliquity to the lateral proximally and coming to the mid patella alongside the patellar tendon distally.  The incision was carried down to the level of the prepatellar bursa. A small amount of dissection was required medially to expose the medial retinaculum and we saw all of the quadriceps tendon.  The knee was opened medially by incising through a small bit of the quadriceps tendon down through the medial retinaculum and along the medial side of the patellar tendon.  On opening the knee, a large volume of yellow synovial fluid was released.  A huge amount of  osteophytic material was throughout the knee.  Large osteophytes were removed from about the femur, patella, and tibia.  The distal femur was resected 12 mm in 5 degrees of valgus using an intramedullary guide. Then we identified the medial and lateral epicondyle and drew a transepicondylar line.  We then used the adjustable sizing guide and elected on size #9 femoral component and a transepicondylar line being perpendicular to the trochlear line was in about 3 degrees of external rotation.  Using this setting, the transepicondylar line was marked.  Then we used the chamfer guide to resect anteriorly, posteriorly, and all of the chamfers.  Having done this, then the notch in the femur was made using the guide.  Attention was then directed to the tibia.  Progressively with sharp dissection, we elevated the medial collateral ligament past anserine bursa and carried this around to the posteromedial corner, exposing the tibia because of the significant varus. Then laterally I did just enough dissection to expose the tibial plateau and did leave the popliteus untouched and resected the cruciates.  The tibia was brought fully forward.  Then using a intramedullary guide, I resected a little of 10 mm from the highest side, which resulted in just skimming the medial tibial plateau.  This guide was set with 5 degrees of posterior tilt.  Having done this,  the bony fragments were removed all bits of osteophytes were removed.  Posteriorly there were large remaining osteophytes in the back of the femur.  These were resected.  The patella measured 24 mm in thickness.  I resected 10 mm and then used the guide and determined that we would use size #9.  Patellar peg holes were made. At this point then, we used a size #9 femoral component, #9 tibial component, and #9 patellar component for trial reduction and found that using a 15 mm thick tibial bearing, we achieved full extension and full flexion and  had normal stability in flexion and in extension was stable, but with a little bit further flexion it was a little looser laterally than medially.  The guide was then used to impact the proximal tibia so that the tibial tray fin would fit.  Having done all of this, we were about ready to cement.  It should be noted that all of his bone was very, very hard bone and was difficult to resect and took extra time.  At this point, we were nearly two hours and decided to let the tourniquet down.  The tourniquet was let down.  A few bleeders were bovied.  Then we went on to use pulsatile lavage.  We dried all surfaces the best we could, cemented the tibial component in place, and removed all excess cement.  Using the same technique, we cemented the femoral component in place and removed excess cement.  We placed a trial tibial bearing in place, allowed the knee to follow in extension, and then went on to cement the patellar button, following which all excess cement was removed.  While this cement was drying, we spent time looking for bits of debris and removed it all.  After a long period of hardening, the cement was set up and then again looked for more bits of cement and debris.  When we were satisfied this was all clear, we then installed the 15 mm thick #9 tibial bearing. Having done this, then we were able to take the patella and thin it perpendicular to the trochlear groove of the femur.  We had full flexion and full extension.  We again irrigated one more time and then closed the retinaculum using multiple interrupted figure-of-eight sutures of 1-0 Vicryl, 2-0 Vicryl for more superficial tissues, and 3-0 Vicryl for the subcutaneous tissues.  Closed over one suction Hemovac superficial to the retinaculum and the trocar was brought out proximal and lateral.  The skin was dressed with Xeroform, 4 x 4s, ABDs, Kerlix, and Ace wrap, following which the patient was to have gotten a femoral block.   The estimated blood loss was approximately 300 cc and none was replaced.  The patient seemed to tolerate the procedure well. Dictated by:   Reynolds Bowl, M.D.  Attending Physician:  Maryanna Shape DD:  03/06/02 TD:  03/07/02 Job: [id number]

## 2011-03-12 NOTE — Discharge Summary (Signed)
[place]. [facility]  Patient:    [name], [name] Visit Number: [id number] MRN: [id number]          Service Type: SUR Attending Physician:  Reynolds Bowl Md Dictated by:   Reynolds Bowl, M.D. Admit Date:  03/06/2002 Discharge Date: 03/10/2002                             Discharge Summary  ADMISSION DIAGNOSES: 1. Tricompartment osteoarthritis, right knee. 2. Hypertension. 3. Gout. 4. History of prostate cancer, treated.  DISCHARGE DIAGNOSES: 1. Tricompartment osteoarthritis, right knee. 2. Hypertension. 3. Gout. 4. History of prostate cancer, treated.  HISTORY OF PRESENT ILLNESS:  For history and physical, see that dictated on admission.  COURSE IN THE HOSPITAL:  On the day of admission, the patient underwent a cemented right total knee arthroplasty as detailed in the operative note.  At surgery, it was estimated that he had 300 cc of blood loss.  Preoperatively and postoperatively, he was on prophylactic Ancef.  Immediately postoperatively, he was begun on Coumadin protocol.  CPM machine was started postoperatively.  On first day postoperative, his Foley and IV were discontinued.  Hewas begun on early motion with rehab as well as partial weightbearing.  He progressed well without any apparent problems and was discharged Mar 10, 2002 with 80 degrees of flexion, with plans to be followed at home by home nurse and home physical therapy and gradually weightbearing as tolerated, working for greater motion as tolerated.  DISCHARGE MEDICATIONS:  He had prescription for Coumadin to be taken per protocol and Tylox or Vicodin as needed for pain and he was to take Ambien for sleep.   FOLLOWUP:  He was to come to see me in the office by two weeks asa routine and was to call sooner with any concerns.  LABORATORY AND ACCESSORY DATA:  Some of the laboratory data obtained for this admission were:  EKG -- normal sinus rhythm with first-degree A-V  block, minimal voltage criteria for LVH.  Admitting hemoglobin 14.1, discharge hemoglobin 8.6, differential normal.  INR on discharge is 1.5, the goal was 2.0; this will be adjusted.  Routine chemistries normal, creatinine 1.1, total protein 7.0, potassium 3.6. Dictated by:   Reynolds Bowl, M.D. Attending Physician:  Reynolds Bowl Md DD:  03/22/02 TD:  03/23/02 Job: [id number]

## 2011-03-17 ENCOUNTER — Other Ambulatory Visit (INDEPENDENT_AMBULATORY_CARE_PROVIDER_SITE_OTHER): Payer: Medicare Other | Admitting: *Deleted

## 2011-03-17 DIAGNOSIS — I5031 Acute diastolic (congestive) heart failure: Secondary | ICD-10-CM

## 2011-03-17 DIAGNOSIS — I5022 Chronic systolic (congestive) heart failure: Secondary | ICD-10-CM

## 2011-03-17 LAB — PROTIME-INR
INR: 3.2 ratio — ABNORMAL HIGH (ref 0.8–1.0)
Prothrombin Time: 31.9 s — ABNORMAL HIGH (ref 10.2–12.4)

## 2011-03-17 LAB — BASIC METABOLIC PANEL
BUN: 21 mg/dL (ref 6–23)
Calcium: 9 mg/dL (ref 8.4–10.5)
GFR: 47.05 mL/min — ABNORMAL LOW (ref >60.00)
Glucose, Bld: 91 mg/dL (ref 70–99)
Sodium: 144 mEq/L (ref 135–145)

## 2011-03-17 LAB — CBC WITH DIFFERENTIAL/PLATELET
Basophils Absolute: 0 10*3/uL (ref 0.0–0.1)
Hemoglobin: 14.1 g/dL (ref 13.0–17.0)
Lymphocytes Relative: 22.7 % (ref 12.0–46.0)
Monocytes Relative: 10.2 % (ref 3.0–12.0)
Neutrophils Relative %: 61.2 % (ref 43.0–77.0)
Platelets: 128 10*3/uL — ABNORMAL LOW (ref 150.0–400.0)
RDW: 14.6 % (ref 11.5–14.6)

## 2011-03-18 ENCOUNTER — Other Ambulatory Visit: Payer: Medicare Other | Admitting: *Deleted

## 2011-03-24 ENCOUNTER — Ambulatory Visit (HOSPITAL_COMMUNITY)
Admission: RE | Admit: 2011-03-24 | Discharge: 2011-03-24 | Disposition: A | Discharge status: Home / Self Care | Payer: Medicare Other | Source: Ambulatory Visit | Attending: Internal Medicine | Admitting: Internal Medicine

## 2011-03-24 DIAGNOSIS — Z4502 Encounter for adjustment and management of automatic implantable cardiac defibrillator: Secondary | ICD-10-CM | POA: Insufficient documentation

## 2011-03-24 DIAGNOSIS — I428 Other cardiomyopathies: Secondary | ICD-10-CM | POA: Insufficient documentation

## 2011-03-24 DIAGNOSIS — I2589 Other forms of chronic ischemic heart disease: Secondary | ICD-10-CM

## 2011-03-24 DIAGNOSIS — I509 Heart failure, unspecified: Secondary | ICD-10-CM | POA: Insufficient documentation

## 2011-03-24 DIAGNOSIS — I447 Left bundle-branch block, unspecified: Secondary | ICD-10-CM | POA: Insufficient documentation

## 2011-03-24 LAB — PROTIME-INR
INR: 1.76 — ABNORMAL HIGH (ref 0.00–1.49)
Prothrombin Time: 20.7 seconds — ABNORMAL HIGH (ref 11.6–15.2)

## 2011-03-31 ENCOUNTER — Ambulatory Visit (INDEPENDENT_AMBULATORY_CARE_PROVIDER_SITE_OTHER): Payer: Medicare Other | Admitting: *Deleted

## 2011-03-31 ENCOUNTER — Encounter: Payer: Self-pay | Admitting: Internal Medicine

## 2011-03-31 DIAGNOSIS — I428 Other cardiomyopathies: Secondary | ICD-10-CM

## 2011-03-31 DIAGNOSIS — I5022 Chronic systolic (congestive) heart failure: Secondary | ICD-10-CM

## 2011-03-31 DIAGNOSIS — I4891 Unspecified atrial fibrillation: Secondary | ICD-10-CM

## 2011-03-31 NOTE — Progress Notes (Signed)
Wound check-ICD 

## 2011-05-13 NOTE — Op Note (Signed)
  [name], Jacob NO.:  [id number]  MEDICAL RECORD NO.:  [id number]           PATIENT TYPE:  O  LOCATION:  [facility]                         FACILITY:  [facility]  PHYSICIAN:  Doylene Canning. Ladona Ridgel, MD    DATE OF BIRTH:  07-20-1930  DATE OF PROCEDURE:  03/24/2011 DATE OF DISCHARGE:                              OPERATIVE REPORT   PROCEDURE PERFORMED:  Removal of previous implanted BiV ICD which had reached ERI and insertion of a new BiV ICD with ICD pocket revision and defibrillation threshold testing.  INTRODUCTION:  The patient is a 77 year old male with an ischemic cardiomyopathy, congestive heart failure, left bundle-branch block status post BiV ICD insertion.  His heart failure improved markedly after insertion of his BiV device.  He is now referred for ICD generator change as his initial device has reached elective replacement.  PROCEDURE:  After informed consent was obtained, the patient was taken to the diagnostic EP lab in the fasting state.  After usual preparation and draping, intravenous fentanyl and midazolam was given for sedation. Lidocaine 30 mL was infiltrated into the left infraclavicular region.  A 6-cm incision was carried out over this region.  Electrocautery was utilized to dissect down to the fascial plane.  The ICD pocket was entered with electrocautery.  Gentle traction was utilized to remove the ICD from its pocket.  Electrocautery was utilized to free up the adhesions around the lead.  His pocket was irrigated with antibiotic irrigation.  The old Medtronic InSync sensory was disconnected from the atrial RV and LV leads and the new Medtronic Concerto BiV ICD, serial number [id number], was connected to the atrial and RV leads and placed back in the subcutaneous pocket.  The pocket was irrigated with antibiotic irrigation after the pocket was enlarged to accommodate the larger size and accommodate the scar tissue that was present in the pocket  itself.  Electrocautery was again utilized to assure hemostasis. The pocket was irrigated with antibiotic irrigation, and the patient was more deeply sedated for defibrillation threshold testing.  After the patient was more deeply sedated with fentanyl and Versed, VF was induced with a T-wave shock.  A 15-joule shock was subsequently delivered which terminated VF and restored sinus rhythm.  No additional defibrillation threshold testing was carried out and the incision was closed with 2-0 and 3-0 Vicryl.  Benzoin and Steri-Strips were painted on the skin.  A pressure dressing was applied, and the patient was returned to his room in satisfactory condition.  COMPLICATIONS:  There were no immediate procedural complications.  RESULTS:  This demonstrates successful removal of previously implanted BiV ICD which had reached ERI followed by ICD pocket revision followed by insertion of a new BiV ICD with defibrillation threshold testing.     Doylene Canning. Ladona Ridgel, MD     GWT/MEDQ  D:  03/24/2011  T:  03/25/2011  Job:  [id number]  Electronically Signed by Lewayne Bunting MD on 05/13/2011 08:29:54 AM

## 2011-06-16 ENCOUNTER — Ambulatory Visit (INDEPENDENT_AMBULATORY_CARE_PROVIDER_SITE_OTHER): Payer: Medicare Other | Admitting: Internal Medicine

## 2011-06-16 ENCOUNTER — Encounter: Payer: Self-pay | Admitting: Internal Medicine

## 2011-06-16 DIAGNOSIS — I4891 Unspecified atrial fibrillation: Secondary | ICD-10-CM

## 2011-06-16 DIAGNOSIS — Z95810 Presence of automatic (implantable) cardiac defibrillator: Secondary | ICD-10-CM

## 2011-06-16 DIAGNOSIS — I5022 Chronic systolic (congestive) heart failure: Secondary | ICD-10-CM

## 2011-06-16 NOTE — Assessment & Plan Note (Signed)
His device is working normally. We'll plan to recheck in several months. 

## 2011-06-16 NOTE — Progress Notes (Signed)
HPI Mr. Ringenberg returns today for followup. He is a very pleasant 77 year old man with a history of chronic systolic heart failure, atrial fibrillation, and ischemic cardiomyopathy, status post ICD implantation. Despite his multiple problems, the patient has done well. He has class II congestive heart failure. He denies chest pain, shortness of breath, or peripheral edema. He has had no recent ICD shocks. He denies compliance problems and works very hard to maintain a low-sodium diet by his report No Known Allergies   Current Outpatient Prescriptions  Medication Sig Dispense Refill  . atorvastatin (LIPITOR) 40 MG tablet Take 40 mg by mouth daily.        . carvedilol (COREG CR) 40 MG 24 hr capsule Take 40 mg by mouth daily.        . DULoxetine (CYMBALTA) 60 MG capsule Take 60 mg by mouth daily.        Marland Kitchen eplerenone (INSPRA) 25 MG tablet Take 25 mg by mouth 2 (two) times daily.        . furosemide (LASIX) 80 MG tablet Take 80 mg by mouth daily.       . Multiple Vitamin (MULTIVITAMINS PO) Take by mouth daily.        . potassium chloride SA (K-DUR,KLOR-CON) 20 MEQ tablet Take 20 mEq by mouth daily.       . ramipril (ALTACE) 10 MG capsule  1 tab qd      . TRAZODONE HCL PO Take 1 tablet by mouth at bedtime.        Marland Kitchen warfarin (COUMADIN) 5 MG tablet Use as directed by Anticoagulation Clinic          Past Medical History  Diagnosis Date  . Dyslipidemia   . CHF (congestive heart failure), NYHA class II   . Atrial fibrillation   . Prostate cancer     ROS:   All systems reviewed and negative except as noted in the HPI.   Past Surgical History  Procedure Date  . Medtronic biv icd 10/2006  . Pci/stent 02/2005  . Total knee arthroplasty      Family History  Problem Relation Age of Onset  . Diabetes Mother      History   Social History  . Marital Status: Married    Spouse Name: N/A    Number of Children: N/A  . Years of Education: N/A   Occupational History  . retired     Social History Main Topics  . Smoking status: Former Games developer  . Smokeless tobacco: Not on file   Comment: stopped smoking in 1970  . Alcohol Use: Not on file  . Drug Use: Not on file  . Sexually Active: Not on file   Other Topics Concern  . Not on file   Social History Narrative  . No narrative on file     BP 130/82  Pulse 60  Ht 5\' 11"  (1.803 m)  Wt 206 lb (93.441 kg)  BMI 28.73 kg/m2  Physical Exam:  Well appearing NAD HEENT: Unremarkable Neck:  No JVD, no thyromegally Lymphatics:  No adenopathy Back:  No CVA tenderness Lungs:  Clear. Well-healed ICD incision. HEART:  Regular rate rhythm, no murmurs, no rubs, no clicks Abd:  soft, positive bowel sounds, no organomegally, no rebound, no guarding Ext:  2 plus pulses, no edema, no cyanosis, no clubbing Skin:  No rashes no nodules Neuro:  CN II through XII intact, motor grossly intact  DEVICE  Normal device function.  See PaceArt for details.  Assess/Plan:

## 2011-06-16 NOTE — Patient Instructions (Signed)
Your physician wants you to follow-up in: 1 year with Dr. Ladona Ridgel. You will receive a reminder letter in the mail two months in advance. If you don't receive a letter, please call our office to schedule the follow-up appointment.  Remote monitoring is used to monitor your Pacemaker of ICD from home. This monitoring reduces the number of office visits required to check your device to one time per year. It allows [place] to keep an eye on the functioning of your device to ensure it is working properly. You are scheduled for a device check from home on 09/17/11. You may send your transmission at any time that day. If you have a wireless device, the transmission will be sent automatically. After your physician reviews your transmission, you will receive a postcard with your next transmission date.  Your physician recommends that you continue on your current medications as directed. Please refer to the Current Medication list given to you today.

## 2011-06-16 NOTE — Assessment & Plan Note (Signed)
His current symptoms are class II. He will continue his medical therapy and maintain a low-sodium diet. I have asked him to walk on a regular daily basis.

## 2011-06-16 NOTE — Assessment & Plan Note (Signed)
His ventricular rate appears to be well-controlled. He will continue his current medical therapy. 

## 2011-07-27 LAB — COMPREHENSIVE METABOLIC PANEL
ALT: 14
AST: 25
Alkaline Phosphatase: 114
GFR calc Af Amer: 33 — ABNORMAL LOW
Glucose, Bld: 113 — ABNORMAL HIGH
Potassium: 3.6
Sodium: 144
Total Protein: 6.9

## 2011-07-27 LAB — POCT CARDIAC MARKERS: CKMB, poc: 1.1

## 2011-07-27 LAB — DIFFERENTIAL
Basophils Relative: 1
Eosinophils Absolute: 0.1
Eosinophils Relative: 2
Lymphs Abs: 1.3
Monocytes Absolute: 0.6
Monocytes Relative: 10
Neutrophils Relative %: 66

## 2011-07-27 LAB — CBC
Hemoglobin: 14.9
RBC: 5.03
RDW: 13.7

## 2011-07-27 LAB — URINE CULTURE: Colony Count: 8000

## 2011-07-27 LAB — URINALYSIS, ROUTINE W REFLEX MICROSCOPIC
Glucose, UA: NEGATIVE
Hgb urine dipstick: NEGATIVE
Ketones, ur: NEGATIVE
Protein, ur: NEGATIVE

## 2011-08-02 LAB — CBC
HCT: 24.5 — ABNORMAL LOW
HCT: 25.8 — ABNORMAL LOW
HCT: 26.5 — ABNORMAL LOW
HCT: 30.5 — ABNORMAL LOW
Hemoglobin: 10.1 — ABNORMAL LOW
Hemoglobin: 8.4 — ABNORMAL LOW
MCHC: 32.4
MCHC: 32.4
MCHC: 32.8
MCHC: 33.4
MCV: 87.1
MCV: 87.5
MCV: 87.6
MCV: 88
MCV: 88.2
MCV: 88.3
MCV: 88.4
Platelets: 136 — ABNORMAL LOW
Platelets: 153
Platelets: 165
RBC: 2.78 — ABNORMAL LOW
RBC: 2.92 — ABNORMAL LOW
RBC: 3.01 — ABNORMAL LOW
RBC: 3.43 — ABNORMAL LOW
RDW: 14.6
RDW: 15
RDW: 15.3
RDW: 15.3
WBC: 10.6 — ABNORMAL HIGH
WBC: 8.8
WBC: 9
WBC: 9.1
WBC: 9.9

## 2011-08-02 LAB — PREPARE FRESH FROZEN PLASMA

## 2011-08-02 LAB — BASIC METABOLIC PANEL
BUN: 112 — ABNORMAL HIGH
BUN: 127 — ABNORMAL HIGH
BUN: 136 — ABNORMAL HIGH
CO2: 25
Calcium: 8.1 — ABNORMAL LOW
Calcium: 8.3 — ABNORMAL LOW
Calcium: 8.4
Chloride: 102
Chloride: 106
Chloride: 99
Creatinine, Ser: 2.82 — ABNORMAL HIGH
Creatinine, Ser: 3.45 — ABNORMAL HIGH
Creatinine, Ser: 4.46 — ABNORMAL HIGH
GFR calc Af Amer: 16 — ABNORMAL LOW
GFR calc non Af Amer: 14 — ABNORMAL LOW
GFR calc non Af Amer: 17 — ABNORMAL LOW
Glucose, Bld: 87
Glucose, Bld: 92
Glucose, Bld: 98
Potassium: 4.1
Sodium: 141

## 2011-08-02 LAB — HEMOGLOBIN AND HEMATOCRIT, BLOOD
HCT: 24.4 — ABNORMAL LOW
HCT: 29.4 — ABNORMAL LOW
HCT: 30.1 — ABNORMAL LOW
HCT: 30.1 — ABNORMAL LOW
HCT: 31.2 — ABNORMAL LOW
HCT: 32.6 — ABNORMAL LOW
HCT: 32.9 — ABNORMAL LOW
Hemoglobin: 10.1 — ABNORMAL LOW
Hemoglobin: 10.3 — ABNORMAL LOW
Hemoglobin: 10.8 — ABNORMAL LOW
Hemoglobin: 8 — ABNORMAL LOW
Hemoglobin: 9.9 — ABNORMAL LOW
Hemoglobin: 9.9 — ABNORMAL LOW

## 2011-08-02 LAB — COMPREHENSIVE METABOLIC PANEL
AST: 34
Albumin: 2.8 — ABNORMAL LOW
Calcium: 8.2 — ABNORMAL LOW
Creatinine, Ser: 4.12 — ABNORMAL HIGH
GFR calc Af Amer: 17 — ABNORMAL LOW
Total Protein: 6

## 2011-08-02 LAB — URINALYSIS, ROUTINE W REFLEX MICROSCOPIC
Glucose, UA: NEGATIVE
Ketones, ur: 15 — AB
Ketones, ur: 40 — AB
Nitrite: POSITIVE — AB
Protein, ur: >300 — AB
Specific Gravity, Urine: 1.037 — ABNORMAL HIGH
Urobilinogen, UA: 1
pH: 5.5

## 2011-08-02 LAB — PROTIME-INR
INR: 1.7 — ABNORMAL HIGH
INR: 2 — ABNORMAL HIGH
INR: 4.4 — ABNORMAL HIGH
INR: >10.8
Prothrombin Time: 20.1 — ABNORMAL HIGH
Prothrombin Time: >90 — ABNORMAL HIGH

## 2011-08-02 LAB — OCCULT BLOOD X 1 CARD TO LAB, STOOL
Fecal Occult Bld: NEGATIVE
Fecal Occult Bld: NEGATIVE

## 2011-08-02 LAB — CARDIAC PANEL(CRET KIN+CKTOT+MB+TROPI)
Relative Index: 1.3
Relative Index: 1.5
Total CK: 156
Troponin I: 0.05

## 2011-08-02 LAB — DIFFERENTIAL
Basophils Relative: 0
Eosinophils Absolute: 0.1 — ABNORMAL LOW
Eosinophils Absolute: 0.2
Lymphocytes Relative: 8 — ABNORMAL LOW
Lymphs Abs: 0.6 — ABNORMAL LOW
Lymphs Abs: 0.6 — ABNORMAL LOW
Neutro Abs: 6
Neutrophils Relative %: 80 — ABNORMAL HIGH
Neutrophils Relative %: 81 — ABNORMAL HIGH

## 2011-08-02 LAB — APTT: aPTT: 51 — ABNORMAL HIGH

## 2011-08-02 LAB — CROSSMATCH: Antibody Screen: NEGATIVE

## 2011-08-02 LAB — I-STAT 8, (EC8 V) (CONVERTED LAB)
BUN: >140 — ABNORMAL HIGH
Glucose, Bld: 126 — ABNORMAL HIGH
Hemoglobin: 9.2 — ABNORMAL LOW
Potassium: 3.8
Sodium: 141

## 2011-08-02 LAB — PREPARE RBC (CROSSMATCH)

## 2011-08-02 LAB — URINE MICROSCOPIC-ADD ON

## 2011-08-02 LAB — URINE CULTURE: Colony Count: >=100000

## 2011-08-02 LAB — ABO/RH: ABO/RH(D): O POS

## 2011-08-02 LAB — HEMOGLOBIN A1C
Hgb A1c MFr Bld: 5.8
Mean Plasma Glucose: 129

## 2011-09-17 ENCOUNTER — Ambulatory Visit (INDEPENDENT_AMBULATORY_CARE_PROVIDER_SITE_OTHER): Payer: Medicare Other | Admitting: *Deleted

## 2011-09-17 DIAGNOSIS — I2589 Other forms of chronic ischemic heart disease: Secondary | ICD-10-CM

## 2011-09-17 DIAGNOSIS — I5022 Chronic systolic (congestive) heart failure: Secondary | ICD-10-CM

## 2011-09-17 DIAGNOSIS — Z95810 Presence of automatic (implantable) cardiac defibrillator: Secondary | ICD-10-CM

## 2011-09-17 DIAGNOSIS — I4891 Unspecified atrial fibrillation: Secondary | ICD-10-CM

## 2011-09-18 ENCOUNTER — Encounter: Payer: Self-pay | Admitting: *Deleted

## 2011-09-20 ENCOUNTER — Encounter: Payer: Self-pay | Admitting: Internal Medicine

## 2011-09-20 ENCOUNTER — Other Ambulatory Visit: Payer: Self-pay | Admitting: Internal Medicine

## 2011-09-24 LAB — REMOTE ICD DEVICE
AL AMPLITUDE: 2 mv
ATRIAL PACING ICD: 10.7 pct
CHARGE TIME: 8.518 s
LV LEAD IMPEDENCE ICD: 646 Ohm
RV LEAD IMPEDENCE ICD: 418 Ohm
TOT-0001: 1
TOT-0002: 0
TOT-0006: 20120530000000
TZAT-0001ATACH: 1
TZAT-0002ATACH: NEGATIVE
TZAT-0002ATACH: NEGATIVE
TZAT-0002ATACH: NEGATIVE
TZAT-0002FASTVT: NEGATIVE
TZAT-0011SLOWVT: 10 ms
TZAT-0011SLOWVT: 10 ms
TZAT-0012SLOWVT: 200 ms
TZAT-0013SLOWVT: 2
TZAT-0013SLOWVT: 2
TZAT-0018ATACH: NEGATIVE
TZAT-0018ATACH: NEGATIVE
TZAT-0018SLOWVT: NEGATIVE
TZAT-0018SLOWVT: NEGATIVE
TZAT-0019ATACH: 6 V
TZAT-0019ATACH: 6 V
TZAT-0019ATACH: 6 V
TZAT-0019SLOWVT: 8 V
TZAT-0019SLOWVT: 8 V
TZAT-0020ATACH: 1.5 ms
TZAT-0020ATACH: 1.5 ms
TZON-0004SLOWVT: 28
TZON-0005SLOWVT: 12
TZST-0001ATACH: 5
TZST-0001FASTVT: 3
TZST-0001FASTVT: 5
TZST-0001SLOWVT: 4
TZST-0001SLOWVT: 6
TZST-0002ATACH: NEGATIVE
TZST-0002ATACH: NEGATIVE
TZST-0002ATACH: NEGATIVE
TZST-0002FASTVT: NEGATIVE
TZST-0002FASTVT: NEGATIVE
TZST-0003SLOWVT: 25 J
TZST-0003SLOWVT: 35 J

## 2011-09-28 NOTE — Progress Notes (Signed)
Remote icd check w/icm  

## 2011-10-01 ENCOUNTER — Encounter: Payer: Self-pay | Admitting: *Deleted

## 2011-12-30 ENCOUNTER — Encounter: Payer: Self-pay | Admitting: Internal Medicine

## 2011-12-30 ENCOUNTER — Ambulatory Visit (INDEPENDENT_AMBULATORY_CARE_PROVIDER_SITE_OTHER): Payer: Medicare Other | Admitting: *Deleted

## 2011-12-30 DIAGNOSIS — I4891 Unspecified atrial fibrillation: Secondary | ICD-10-CM

## 2011-12-30 DIAGNOSIS — I428 Other cardiomyopathies: Secondary | ICD-10-CM

## 2011-12-30 DIAGNOSIS — Z95810 Presence of automatic (implantable) cardiac defibrillator: Secondary | ICD-10-CM

## 2011-12-30 DIAGNOSIS — I5022 Chronic systolic (congestive) heart failure: Secondary | ICD-10-CM

## 2011-12-31 LAB — REMOTE ICD DEVICE
ATRIAL PACING ICD: 24.72 pct
BAMS-0001: 170 {beats}/min
CHARGE TIME: 8.518 s
FVT: 0
LV LEAD THRESHOLD: 0.625 V
PACEART VT: 0
RV LEAD IMPEDENCE ICD: 418 Ohm
RV LEAD THRESHOLD: 0.625 V
TOT-0001: 1
TZAT-0001ATACH: 1
TZAT-0002ATACH: NEGATIVE
TZAT-0002ATACH: NEGATIVE
TZAT-0005SLOWVT: 84 pct
TZAT-0005SLOWVT: 91 pct
TZAT-0011SLOWVT: 10 ms
TZAT-0011SLOWVT: 10 ms
TZAT-0012SLOWVT: 200 ms
TZAT-0012SLOWVT: 200 ms
TZAT-0018ATACH: NEGATIVE
TZAT-0018SLOWVT: NEGATIVE
TZAT-0018SLOWVT: NEGATIVE
TZAT-0019ATACH: 6 V
TZAT-0019ATACH: 6 V
TZAT-0019ATACH: 6 V
TZAT-0019FASTVT: 8 V
TZAT-0020ATACH: 1.5 ms
TZAT-0020FASTVT: 1.5 ms
TZON-0003ATACH: 350 ms
TZON-0003SLOWVT: 350 ms
TZON-0005SLOWVT: 12
TZST-0001ATACH: 5
TZST-0001FASTVT: 2
TZST-0001FASTVT: 3
TZST-0001FASTVT: 5
TZST-0001SLOWVT: 4
TZST-0001SLOWVT: 6
TZST-0002FASTVT: NEGATIVE
TZST-0002FASTVT: NEGATIVE
TZST-0002FASTVT: NEGATIVE
TZST-0003SLOWVT: 15 J
TZST-0003SLOWVT: 35 J
VENTRICULAR PACING ICD: 90.14 pct
VF: 0

## 2012-01-05 NOTE — Progress Notes (Signed)
Remote icd check w/icm  

## 2012-01-11 ENCOUNTER — Encounter: Payer: Self-pay | Admitting: *Deleted

## 2012-03-30 ENCOUNTER — Encounter: Payer: Medicare Other | Admitting: *Deleted

## 2012-04-10 ENCOUNTER — Encounter: Payer: Self-pay | Admitting: *Deleted

## 2012-04-18 ENCOUNTER — Telehealth: Payer: Self-pay | Admitting: Internal Medicine

## 2012-04-18 NOTE — Telephone Encounter (Signed)
New msg Pt wants to talk to you about transmission. Please call

## 2012-04-18 NOTE — Telephone Encounter (Signed)
Transmission not received.  Patient will try sending again this afternoon. Advised we will check in the morning and call him back to let him know if it was received.

## 2012-04-19 NOTE — Telephone Encounter (Signed)
Spoke w/pt---did not receive transmission from last night. Instructed pt to call Medtronic so they can help troubleshoot.

## 2012-06-08 ENCOUNTER — Encounter: Payer: Self-pay | Admitting: *Deleted

## 2012-06-16 ENCOUNTER — Encounter: Payer: Self-pay | Admitting: Internal Medicine

## 2012-06-16 ENCOUNTER — Ambulatory Visit (INDEPENDENT_AMBULATORY_CARE_PROVIDER_SITE_OTHER): Payer: Medicare Other | Admitting: Internal Medicine

## 2012-06-16 VITALS — BP 130/86 | HR 72 | Ht 71.0 in | Wt 194.0 lb

## 2012-06-16 DIAGNOSIS — I4891 Unspecified atrial fibrillation: Secondary | ICD-10-CM

## 2012-06-16 DIAGNOSIS — Z95810 Presence of automatic (implantable) cardiac defibrillator: Secondary | ICD-10-CM

## 2012-06-16 DIAGNOSIS — I5022 Chronic systolic (congestive) heart failure: Secondary | ICD-10-CM

## 2012-06-16 LAB — ICD DEVICE OBSERVATION
AL AMPLITUDE: 3.3 mv
BATTERY VOLTAGE: 3.1142 V
CHARGE TIME: 8.968 s
FVT: 0
PACEART VT: 0
TOT-0002: 0
TOT-0006: 20120530000000
TZAT-0001ATACH: 1
TZAT-0001ATACH: 3
TZAT-0001FASTVT: 1
TZAT-0001SLOWVT: 1
TZAT-0001SLOWVT: 2
TZAT-0002ATACH: NEGATIVE
TZAT-0002ATACH: NEGATIVE
TZAT-0002FASTVT: NEGATIVE
TZAT-0005SLOWVT: 84 pct
TZAT-0005SLOWVT: 91 pct
TZAT-0011SLOWVT: 10 ms
TZAT-0012ATACH: 150 ms
TZAT-0013SLOWVT: 2
TZAT-0013SLOWVT: 2
TZAT-0018ATACH: NEGATIVE
TZAT-0018ATACH: NEGATIVE
TZAT-0018SLOWVT: NEGATIVE
TZAT-0018SLOWVT: NEGATIVE
TZAT-0019ATACH: 6 V
TZAT-0019ATACH: 6 V
TZAT-0019SLOWVT: 8 V
TZAT-0019SLOWVT: 8 V
TZON-0004SLOWVT: 28
TZON-0005SLOWVT: 12
TZST-0001ATACH: 5
TZST-0001ATACH: 6
TZST-0001FASTVT: 3
TZST-0001FASTVT: 4
TZST-0001FASTVT: 5
TZST-0001SLOWVT: 3
TZST-0001SLOWVT: 4
TZST-0001SLOWVT: 6
TZST-0002ATACH: NEGATIVE
TZST-0002ATACH: NEGATIVE
TZST-0002FASTVT: NEGATIVE
TZST-0002FASTVT: NEGATIVE
TZST-0002FASTVT: NEGATIVE
TZST-0002FASTVT: NEGATIVE
TZST-0003SLOWVT: 25 J
VF: 0

## 2012-06-16 MED ORDER — AMIODARONE HCL 200 MG PO TABS: ORAL_TABLET | ORAL | Status: DC

## 2012-06-16 NOTE — Patient Instructions (Signed)
Your physician recommends that you schedule a follow-up appointment in 5 weeks with Dr Ladona Ridgel   Have Dr Annitta Jersey office check your blood work for your Coumadin in 1 week  Your physician has recommended you make the following change in your medication:  1) Start Amiodarone 200mg  ---take 2 tablets once daily  Remote monitoring is used to monitor your Pacemaker of ICD from home. This monitoring reduces the number of office visits required to check your device to one time per year. It allows [place] to keep an eye on the functioning of your device to ensure it is working properly. You are scheduled for a device check from home on 09/18/12. You may send your transmission at any time that day. If you have a wireless device, the transmission will be sent automatically. After your physician reviews your transmission, you will receive a postcard with your next transmission date.

## 2012-06-16 NOTE — Assessment & Plan Note (Signed)
His congestive heart failure symptoms have worsened slightly and his fluid index is elevated. He has peripheral edema exam. I've encouraged him to reduce his salt intake. He'll continue his current medical therapy. Hopefully his heart failure symptoms will improve if we can get him back to sinus rhythm.

## 2012-06-16 NOTE — Assessment & Plan Note (Signed)
His device is working normally except that he is only pacing 60% of the time. Hopefully can return the patient back to sinus rhythm.

## 2012-06-16 NOTE — Progress Notes (Signed)
HPI [name] is today for followup. He is very pleasant 78 year old man with multiple medical problems, including a nonischemic cardiomyopathy, chronic systolic heart failure, status post biventricular ICD implantation. He admits to dietary indiscretion with sodium. Over the last several weeks to months, he has had increasing peripheral edema, and shortness of breath. He is gaining weight. He has not been hospitalized. No Known Allergies   Current Outpatient Prescriptions  Medication Sig Dispense Refill  . allopurinol (ZYLOPRIM) 100 MG tablet Take 100 mg by mouth daily.       Marland Kitchen atorvastatin (LIPITOR) 40 MG tablet Take 40 mg by mouth daily.        . carvedilol (COREG CR) 40 MG 24 hr capsule Take 40 mg by mouth daily.        . DULoxetine (CYMBALTA) 60 MG capsule Take 60 mg by mouth daily.        Marland Kitchen eplerenone (INSPRA) 25 MG tablet Take 25 mg by mouth 2 (two) times daily.        . furosemide (LASIX) 80 MG tablet Take 80 mg by mouth 2 (two) times daily.       . Multiple Vitamin (MULTIVITAMINS PO) Take by mouth daily.        . potassium chloride SA (K-DUR,KLOR-CON) 20 MEQ tablet Take 20 mEq by mouth daily.       . ramipril (ALTACE) 10 MG capsule  1 tab qd      . warfarin (COUMADIN) 5 MG tablet Use as directed by Anticoagulation Clinic          Past Medical History  Diagnosis Date  . Dyslipidemia   . CHF (congestive heart failure), NYHA class II   . Atrial fibrillation   . Prostate cancer     ROS:   All systems reviewed and negative except as noted in the HPI.   Past Surgical History  Procedure Date  . Medtronic biv icd 10/2006  . Pci/stent 02/2005  . Total knee arthroplasty      Family History  Problem Relation Age of Onset  . Diabetes Mother      History   Social History  . Marital Status: Married    Spouse Name: N/A    Number of Children: N/A  . Years of Education: N/A   Occupational History  . retired    Social History Main Topics  . Smoking status: Former  Games developer  . Smokeless tobacco: Not on file   Comment: stopped smoking in 1970  . Alcohol Use: Not on file  . Drug Use: Not on file  . Sexually Active: Not on file   Other Topics Concern  . Not on file   Social History Narrative  . No narrative on file     BP 130/86  Pulse 72  Ht 5\' 11"  (1.803 m)  Wt 194 lb (87.998 kg)  BMI 27.06 kg/m2  Physical Exam:  Well appearing 78 year old man who looks younger than his stated age. NAD HEENT: Unremarkable Neck:  7 CM JVD, no thyromegally Lungs:  Clear with no wheezes or rhonchi. Rales are present in the bases bilaterally. HEART:  IRegular rate rhythm, no murmurs, no rubs, no clicks Abd:  soft, positive bowel sounds, no organomegally, no rebound, no guarding Ext:  2 plus pulses, no edema, no cyanosis, no clubbing Skin:  No rashes no nodules Neuro:  CN II through XII intact, motor grossly intact   DEVICE  Normal device function.  See PaceArt for details.   Assess/Plan:

## 2012-06-16 NOTE — Assessment & Plan Note (Signed)
He has been out of rhythm for several months now. This coincides with increased fluid. I've recommended starting amiodarone 400 mg daily. I will see him back in approximately 5 weeks. I will plan to set up cardioversion at that time. I recommended that the patient go back to his primary cardiologist and his Coumadin level checked.

## 2012-07-18 ENCOUNTER — Ambulatory Visit (INDEPENDENT_AMBULATORY_CARE_PROVIDER_SITE_OTHER): Payer: Medicare Other | Admitting: Internal Medicine

## 2012-07-18 ENCOUNTER — Encounter: Payer: Self-pay | Admitting: Internal Medicine

## 2012-07-18 ENCOUNTER — Encounter: Payer: Self-pay | Admitting: *Deleted

## 2012-07-18 ENCOUNTER — Encounter (HOSPITAL_COMMUNITY): Payer: Self-pay | Admitting: Pharmacy Technician

## 2012-07-18 VITALS — BP 108/77 | HR 80 | Ht 71.0 in | Wt 205.8 lb

## 2012-07-18 DIAGNOSIS — Z95810 Presence of automatic (implantable) cardiac defibrillator: Secondary | ICD-10-CM

## 2012-07-18 DIAGNOSIS — I5022 Chronic systolic (congestive) heart failure: Secondary | ICD-10-CM

## 2012-07-18 DIAGNOSIS — I4891 Unspecified atrial fibrillation: Secondary | ICD-10-CM

## 2012-07-18 LAB — ICD DEVICE OBSERVATION
AL AMPLITUDE: 2.75 mv
ATRIAL PACING ICD: 6.58 pct
BAMS-0001: 170 {beats}/min
CHARGE TIME: 9.579 s
FVT: 0
PACEART VT: 0
RV LEAD IMPEDENCE ICD: 380 Ohm
TOT-0001: 1
TZAT-0001ATACH: 1
TZAT-0001ATACH: 3
TZAT-0001FASTVT: 1
TZAT-0002ATACH: NEGATIVE
TZAT-0004SLOWVT: 8
TZAT-0011SLOWVT: 10 ms
TZAT-0011SLOWVT: 10 ms
TZAT-0012ATACH: 150 ms
TZAT-0012ATACH: 150 ms
TZAT-0012ATACH: 150 ms
TZAT-0012SLOWVT: 200 ms
TZAT-0012SLOWVT: 200 ms
TZAT-0013SLOWVT: 2
TZAT-0013SLOWVT: 2
TZAT-0018ATACH: NEGATIVE
TZAT-0019ATACH: 6 V
TZAT-0019SLOWVT: 8 V
TZAT-0019SLOWVT: 8 V
TZAT-0020ATACH: 1.5 ms
TZAT-0020ATACH: 1.5 ms
TZAT-0020SLOWVT: 1.5 ms
TZAT-0020SLOWVT: 1.5 ms
TZON-0003ATACH: 350 ms
TZON-0003SLOWVT: 350 ms
TZON-0003VSLOWVT: 400 ms
TZON-0004SLOWVT: 32
TZST-0001ATACH: 4
TZST-0001ATACH: 6
TZST-0001FASTVT: 2
TZST-0001FASTVT: 3
TZST-0001FASTVT: 4
TZST-0001SLOWVT: 4
TZST-0001SLOWVT: 5
TZST-0002ATACH: NEGATIVE
TZST-0002ATACH: NEGATIVE
TZST-0002FASTVT: NEGATIVE
TZST-0002FASTVT: NEGATIVE
TZST-0003SLOWVT: 15 J
TZST-0003SLOWVT: 25 J
TZST-0003SLOWVT: 35 J
VENTRICULAR PACING ICD: 73.38 pct

## 2012-07-18 LAB — CBC WITH DIFFERENTIAL/PLATELET
Basophils Absolute: 0 10*3/uL (ref 0.0–0.1)
HCT: 34.2 % — ABNORMAL LOW (ref 39.0–52.0)
Lymphs Abs: 0.5 10*3/uL — ABNORMAL LOW (ref 0.7–4.0)
MCV: 93.9 fl (ref 78.0–100.0)
Monocytes Absolute: 0.5 10*3/uL (ref 0.1–1.0)
Monocytes Relative: 11.2 % (ref 3.0–12.0)
Neutrophils Relative %: 71.9 % (ref 43.0–77.0)
Platelets: 135 10*3/uL — ABNORMAL LOW (ref 150.0–400.0)
RDW: 20 % — ABNORMAL HIGH (ref 11.5–14.6)
WBC: 4.3 10*3/uL — ABNORMAL LOW (ref 4.5–10.5)

## 2012-07-18 LAB — BASIC METABOLIC PANEL
BUN: 50 mg/dL — ABNORMAL HIGH (ref 6–23)
Creatinine, Ser: 3 mg/dL — ABNORMAL HIGH (ref 0.4–1.5)
GFR: 26.31 mL/min — ABNORMAL LOW (ref >60.00)
Glucose, Bld: 117 mg/dL — ABNORMAL HIGH (ref 70–99)
Potassium: 4.8 mEq/L (ref 3.5–5.1)

## 2012-07-18 LAB — PROTIME-INR
INR: 6.7 ratio (ref 0.8–1.0)
Prothrombin Time: 67.8 s (ref 10.2–12.4)

## 2012-07-18 NOTE — Progress Notes (Signed)
HPI [name] for followup. He is a very pleasant, 78-year-old man with atrial fibrillation, chronic systolic heart failure, status post biventricular ICD implantation. When I saw the patient approximately 6 weeks ago, he had returned atrial fibrillation/flutter. At the time, I recommended up titration of his amiodarone therapy from 200 mg daily to 400 mg daily. At that time, his heart failure symptoms have increased. He has worsening peripheral edema. His INR has been elevated. He denies bleeding. He denies chest pain. No syncope. No Known Allergies   Current Outpatient Prescriptions  Medication Sig Dispense Refill  . allopurinol (ZYLOPRIM) 100 MG tablet Take 100 mg by mouth daily.       . amiodarone (PACERONE) 200 MG tablet Take 2 tablets daily  60 tablet  3  . atorvastatin (LIPITOR) 40 MG tablet Take 40 mg by mouth daily.        . carvedilol (COREG CR) 40 MG 24 hr capsule Take 40 mg by mouth daily.        . DULoxetine (CYMBALTA) 60 MG capsule Take 60 mg by mouth daily.        . eplerenone (INSPRA) 25 MG tablet Take 25 mg by mouth 2 (two) times daily.        . furosemide (LASIX) 80 MG tablet Take 80 mg by mouth 2 (two) times daily.       . Multiple Vitamin (MULTIVITAMINS PO) Take by mouth daily.        . potassium chloride SA (K-DUR,KLOR-CON) 20 MEQ tablet Take 20 mEq by mouth daily.       . ramipril (ALTACE) 10 MG capsule  1 tab qd      . traZODone (DESYREL) 150 MG tablet Take 150 mg by mouth at bedtime.      . warfarin (COUMADIN) 5 MG tablet Use as directed by Anticoagulation Clinic          Past Medical History  Diagnosis Date  . Dyslipidemia   . CHF (congestive heart failure), NYHA class II   . Atrial fibrillation   . Prostate cancer     ROS:   All systems reviewed and negative except as noted in the HPI.   Past Surgical History  Procedure Date  . Medtronic biv icd 10/2006  . Pci/stent 02/2005  . Total knee arthroplasty      Family History  Problem  Relation Age of Onset  . Diabetes Mother      History   Social History  . Marital Status: Married    Spouse Name: N/A    Number of Children: N/A  . Years of Education: N/A   Occupational History  . retired    Social History Main Topics  . Smoking status: Former Smoker  . Smokeless tobacco: Not on file   Comment: stopped smoking in 1970  . Alcohol Use: Not on file  . Drug Use: Not on file  . Sexually Active: Not on file   Other Topics Concern  . Not on file   Social History Narrative  . No narrative on file     BP 108/77  Pulse 80  Ht 5' 11" (1.803 m)  Wt 205 lb 12.8 oz (93.35 kg)  BMI 28.70 kg/m2  Physical Exam:  Chronically ill appearing NAD HEENT: Unremarkable Neck:  8 cm JVD, no thyromegally Lungs:  Clear with no wheezes, rales, or rhonchi. HEART:  Regular rate rhythm, no murmurs, no rubs, no clicks Abd:  soft, positive bowel sounds, no organomegally, no rebound,   no guarding Ext:  2 plus pulses, no edema, no cyanosis, no clubbing Skin:  No rashes no nodules Neuro:  CN II through XII intact, motor grossly intact   DEVICE  Normal device function.  See PaceArt for details. Underlying rhythm is atrial fib/flutter  Assess/Plan:  

## 2012-07-18 NOTE — Assessment & Plan Note (Signed)
His symptoms are currently class III, almost class IV. His diuretic has been uptitrated. He is restricted in his sodium intake. He will continue his current medications.

## 2012-07-18 NOTE — Assessment & Plan Note (Signed)
His device is working normally. He remains out of rhythm.

## 2012-07-18 NOTE — Assessment & Plan Note (Signed)
I've discussed the treatment options with the patient and his wife. His INR has been supra-therapeutic. We'll schedule DC cardioversion in the next few days. He will continue 400mg  of amiodarone for now with hopes of down titration over the next several months.

## 2012-07-18 NOTE — Patient Instructions (Addendum)
Your physician has recommended that you have a Cardioversion (DCCV). Electrical Cardioversion uses a jolt of electricity to your heart either through paddles or wired patches attached to your chest. This is a controlled, usually prescheduled, procedure. Defibrillation is done under light anesthesia in the hospital, and you usually go home the day of the procedure. This is done to get your heart back into a normal rhythm. You are not awake for the procedure. Please see the instruction sheet given to you today.   See instruction sheet  Your physician wants you to follow-up in: 12 months with Dr Court Joy will receive a reminder letter in the mail two months in advance. If you don't receive a letter, please call our office to schedule the follow-up appointment.  Remote monitoring is used to monitor your Pacemaker of ICD from home. This monitoring reduces the number of office visits required to check your device to one time per year. It allows [place] to keep an eye on the functioning of your device to ensure it is working properly. You are scheduled for a device check from home on 10/23/12. You may send your transmission at any time that day. If you have a wireless device, the transmission will be sent automatically. After your physician reviews your transmission, you will receive a postcard with your next transmission date.

## 2012-07-21 ENCOUNTER — Encounter (HOSPITAL_COMMUNITY)
Admission: RE | Disposition: A | Discharge status: Home / Self Care | Payer: Self-pay | Source: Ambulatory Visit | Attending: Internal Medicine

## 2012-07-21 ENCOUNTER — Ambulatory Visit (HOSPITAL_COMMUNITY)
Admission: RE | Admit: 2012-07-21 | Discharge: 2012-07-21 | Disposition: A | Discharge status: Home / Self Care | Payer: Medicare Other | Source: Ambulatory Visit | Attending: Internal Medicine | Admitting: Internal Medicine

## 2012-07-21 DIAGNOSIS — Z79899 Other long term (current) drug therapy: Secondary | ICD-10-CM | POA: Insufficient documentation

## 2012-07-21 DIAGNOSIS — I4891 Unspecified atrial fibrillation: Secondary | ICD-10-CM

## 2012-07-21 DIAGNOSIS — I509 Heart failure, unspecified: Secondary | ICD-10-CM | POA: Insufficient documentation

## 2012-07-21 DIAGNOSIS — Z7901 Long term (current) use of anticoagulants: Secondary | ICD-10-CM | POA: Insufficient documentation

## 2012-07-21 DIAGNOSIS — Z95810 Presence of automatic (implantable) cardiac defibrillator: Secondary | ICD-10-CM | POA: Insufficient documentation

## 2012-07-21 DIAGNOSIS — Z7902 Long term (current) use of antithrombotics/antiplatelets: Secondary | ICD-10-CM | POA: Insufficient documentation

## 2012-07-21 DIAGNOSIS — I5022 Chronic systolic (congestive) heart failure: Secondary | ICD-10-CM | POA: Insufficient documentation

## 2012-07-21 HISTORY — PX: CARDIOVERSION: SHX1299

## 2012-07-21 LAB — BASIC METABOLIC PANEL
GFR calc Af Amer: 23 mL/min — ABNORMAL LOW (ref >90)
GFR calc non Af Amer: 20 mL/min — ABNORMAL LOW (ref >90)
Potassium: 4.2 mEq/L (ref 3.5–5.1)
Sodium: 145 mEq/L (ref 135–145)

## 2012-07-21 LAB — PROTIME-INR
INR: 2.51 — ABNORMAL HIGH (ref 0.00–1.49)
Prothrombin Time: 25.9 seconds — ABNORMAL HIGH (ref 11.6–15.2)

## 2012-07-21 SURGERY — CARDIOVERSION
Anesthesia: LOCAL | Wound class: Clean

## 2012-07-21 MED ORDER — FENTANYL CITRATE 0.05 MG/ML IJ SOLN
INTRAMUSCULAR | Status: AC
  Filled 2012-07-21: qty 2

## 2012-07-21 MED ORDER — MIDAZOLAM HCL 5 MG/5ML IJ SOLN
INTRAMUSCULAR | Status: AC
  Filled 2012-07-21: qty 5

## 2012-07-21 NOTE — Progress Notes (Signed)
REPORT RECEIVED FROM [name] THAT CLIENT WAITING ON WIFE TO PICK HIM UP

## 2012-07-21 NOTE — Progress Notes (Signed)
Wife here.

## 2012-07-21 NOTE — Op Note (Signed)
EP Procedure Note  Procedure: DCCV Indication: symptomatic atrial fibrillation  Findings: Successful DCCV with 360 J of synchronized biphasic energy via the electrodispersive pads which were placed in the AP position. Patient sedated with 4 mg versed and 75 mics of fentanyl. NSR was verified after ICD interogation.  Lewayne Bunting, M.D.

## 2012-07-21 NOTE — Interval H&P Note (Signed)
History and Physical Interval Note:  07/21/2012 1:39 PM  [name]  has presented today for surgery, with the diagnosis of afib  The various methods of treatment have been discussed with the patient and family. After consideration of risks, benefits and other options for treatment, the patient has consented to  Procedure(s) (LRB) with comments: CARDIOVERSION (N/A) as a surgical intervention .  The patient's history has been reviewed, patient examined, no change in status, stable for surgery.  I have reviewed the patient's chart and labs.  Questions were answered to the patient's satisfaction.     [name].D.

## 2012-07-21 NOTE — H&P (View-Only) (Signed)
HPI [name] for followup. He is a very pleasant, 79 year old man with atrial fibrillation, chronic systolic heart failure, status post biventricular ICD implantation. When I saw the patient approximately 6 weeks ago, he had returned atrial fibrillation/flutter. At the time, I recommended up titration of his amiodarone therapy from 200 mg daily to 400 mg daily. At that time, his heart failure symptoms have increased. He has worsening peripheral edema. His INR has been elevated. He denies bleeding. He denies chest pain. No syncope. No Known Allergies   Current Outpatient Prescriptions  Medication Sig Dispense Refill  . allopurinol (ZYLOPRIM) 100 MG tablet Take 100 mg by mouth daily.       Marland Kitchen amiodarone (PACERONE) 200 MG tablet Take 2 tablets daily  60 tablet  3  . atorvastatin (LIPITOR) 40 MG tablet Take 40 mg by mouth daily.        . carvedilol (COREG CR) 40 MG 24 hr capsule Take 40 mg by mouth daily.        . DULoxetine (CYMBALTA) 60 MG capsule Take 60 mg by mouth daily.        Marland Kitchen eplerenone (INSPRA) 25 MG tablet Take 25 mg by mouth 2 (two) times daily.        . furosemide (LASIX) 80 MG tablet Take 80 mg by mouth 2 (two) times daily.       . Multiple Vitamin (MULTIVITAMINS PO) Take by mouth daily.        . potassium chloride SA (K-DUR,KLOR-CON) 20 MEQ tablet Take 20 mEq by mouth daily.       . ramipril (ALTACE) 10 MG capsule  1 tab qd      . traZODone (DESYREL) 150 MG tablet Take 150 mg by mouth at bedtime.      Marland Kitchen warfarin (COUMADIN) 5 MG tablet Use as directed by Anticoagulation Clinic          Past Medical History  Diagnosis Date  . Dyslipidemia   . CHF (congestive heart failure), NYHA class II   . Atrial fibrillation   . Prostate cancer     ROS:   All systems reviewed and negative except as noted in the HPI.   Past Surgical History  Procedure Date  . Medtronic biv icd 10/2006  . Pci/stent 02/2005  . Total knee arthroplasty      Family History  Problem  Relation Age of Onset  . Diabetes Mother      History   Social History  . Marital Status: Married    Spouse Name: N/A    Number of Children: N/A  . Years of Education: N/A   Occupational History  . retired    Social History Main Topics  . Smoking status: Former Games developer  . Smokeless tobacco: Not on file   Comment: stopped smoking in 1970  . Alcohol Use: Not on file  . Drug Use: Not on file  . Sexually Active: Not on file   Other Topics Concern  . Not on file   Social History Narrative  . No narrative on file     BP 108/77  Pulse 80  Ht 5\' 11"  (1.803 m)  Wt 205 lb 12.8 oz (93.35 kg)  BMI 28.70 kg/m2  Physical Exam:  Chronically ill appearing NAD HEENT: Unremarkable Neck:  8 cm JVD, no thyromegally Lungs:  Clear with no wheezes, rales, or rhonchi. HEART:  Regular rate rhythm, no murmurs, no rubs, no clicks Abd:  soft, positive bowel sounds, no organomegally, no rebound,  no guarding Ext:  2 plus pulses, no edema, no cyanosis, no clubbing Skin:  No rashes no nodules Neuro:  CN II through XII intact, motor grossly intact   DEVICE  Normal device function.  See PaceArt for details. Underlying rhythm is atrial fib/flutter  Assess/Plan:

## 2012-09-28 ENCOUNTER — Encounter (HOSPITAL_COMMUNITY): Payer: Self-pay | Admitting: General Practice

## 2012-09-28 ENCOUNTER — Inpatient Hospital Stay (HOSPITAL_COMMUNITY)
Admission: AD | Admit: 2012-09-28 | Discharge: 2012-10-18 | DRG: 264 | Disposition: A | Discharge status: Skilled Nursing Facility | Payer: Medicare Other | Source: Ambulatory Visit | Attending: Cardiovascular Disease | Admitting: Cardiovascular Disease

## 2012-09-28 DIAGNOSIS — Z95810 Presence of automatic (implantable) cardiac defibrillator: Secondary | ICD-10-CM

## 2012-09-28 DIAGNOSIS — I509 Heart failure, unspecified: Secondary | ICD-10-CM | POA: Diagnosis present

## 2012-09-28 DIAGNOSIS — I5023 Acute on chronic systolic (congestive) heart failure: Principal | ICD-10-CM | POA: Diagnosis present

## 2012-09-28 DIAGNOSIS — I120 Hypertensive chronic kidney disease with stage 5 chronic kidney disease or end stage renal disease: Secondary | ICD-10-CM | POA: Diagnosis present

## 2012-09-28 DIAGNOSIS — I2510 Atherosclerotic heart disease of native coronary artery without angina pectoris: Secondary | ICD-10-CM | POA: Diagnosis present

## 2012-09-28 DIAGNOSIS — Z7901 Long term (current) use of anticoagulants: Secondary | ICD-10-CM

## 2012-09-28 DIAGNOSIS — L97809 Non-pressure chronic ulcer of other part of unspecified lower leg with unspecified severity: Secondary | ICD-10-CM | POA: Diagnosis present

## 2012-09-28 DIAGNOSIS — I252 Old myocardial infarction: Secondary | ICD-10-CM

## 2012-09-28 DIAGNOSIS — R5381 Other malaise: Secondary | ICD-10-CM | POA: Diagnosis present

## 2012-09-28 DIAGNOSIS — I4891 Unspecified atrial fibrillation: Secondary | ICD-10-CM | POA: Diagnosis present

## 2012-09-28 DIAGNOSIS — E039 Hypothyroidism, unspecified: Secondary | ICD-10-CM | POA: Diagnosis present

## 2012-09-28 DIAGNOSIS — S8010XA Contusion of unspecified lower leg, initial encounter: Secondary | ICD-10-CM | POA: Diagnosis present

## 2012-09-28 DIAGNOSIS — Z79899 Other long term (current) drug therapy: Secondary | ICD-10-CM

## 2012-09-28 DIAGNOSIS — D638 Anemia in other chronic diseases classified elsewhere: Secondary | ICD-10-CM | POA: Diagnosis present

## 2012-09-28 DIAGNOSIS — Z87891 Personal history of nicotine dependence: Secondary | ICD-10-CM

## 2012-09-28 DIAGNOSIS — N186 End stage renal disease: Secondary | ICD-10-CM | POA: Diagnosis present

## 2012-09-28 DIAGNOSIS — E213 Hyperparathyroidism, unspecified: Secondary | ICD-10-CM | POA: Diagnosis present

## 2012-09-28 DIAGNOSIS — W19XXXA Unspecified fall, initial encounter: Secondary | ICD-10-CM | POA: Diagnosis present

## 2012-09-28 DIAGNOSIS — E876 Hypokalemia: Secondary | ICD-10-CM | POA: Diagnosis not present

## 2012-09-28 DIAGNOSIS — I2589 Other forms of chronic ischemic heart disease: Secondary | ICD-10-CM | POA: Diagnosis present

## 2012-09-28 DIAGNOSIS — I5022 Chronic systolic (congestive) heart failure: Secondary | ICD-10-CM

## 2012-09-28 DIAGNOSIS — Z8546 Personal history of malignant neoplasm of prostate: Secondary | ICD-10-CM

## 2012-09-28 DIAGNOSIS — N179 Acute kidney failure, unspecified: Secondary | ICD-10-CM | POA: Diagnosis present

## 2012-09-28 DIAGNOSIS — Z992 Dependence on renal dialysis: Secondary | ICD-10-CM

## 2012-09-28 HISTORY — DX: Essential (primary) hypertension: I10

## 2012-09-28 HISTORY — DX: Major depressive disorder, single episode, unspecified: F32.9

## 2012-09-28 HISTORY — DX: Acute myocardial infarction, unspecified: I21.9

## 2012-09-28 HISTORY — DX: Depression, unspecified: F32.A

## 2012-09-28 HISTORY — DX: Atherosclerotic heart disease of native coronary artery without angina pectoris: I25.10

## 2012-09-28 LAB — COMPREHENSIVE METABOLIC PANEL
ALT: 20 U/L (ref 0–53)
AST: 21 U/L (ref 0–37)
Alkaline Phosphatase: 139 U/L — ABNORMAL HIGH (ref 39–117)
CO2: 25 mEq/L (ref 19–32)
Calcium: 8.7 mg/dL (ref 8.4–10.5)
Chloride: 110 mEq/L (ref 96–112)
GFR calc Af Amer: 14 mL/min — ABNORMAL LOW (ref >90)
GFR calc non Af Amer: 12 mL/min — ABNORMAL LOW (ref >90)
Glucose, Bld: 92 mg/dL (ref 70–99)
Potassium: 4.6 mEq/L (ref 3.5–5.1)
Sodium: 147 mEq/L — ABNORMAL HIGH (ref 135–145)

## 2012-09-28 LAB — MAGNESIUM: Magnesium: 2.7 mg/dL — ABNORMAL HIGH (ref 1.5–2.5)

## 2012-09-28 LAB — CBC WITH DIFFERENTIAL/PLATELET
Basophils Absolute: 0 10*3/uL (ref 0.0–0.1)
Eosinophils Relative: 3 % (ref 0–5)
Lymphocytes Relative: 17 % (ref 12–46)
Lymphs Abs: 0.9 10*3/uL (ref 0.7–4.0)
MCV: 89.1 fL (ref 78.0–100.0)
Neutro Abs: 3.7 10*3/uL (ref 1.7–7.7)
Neutrophils Relative %: 69 % (ref 43–77)
Platelets: 156 10*3/uL (ref 150–400)
RBC: 4.04 MIL/uL — ABNORMAL LOW (ref 4.22–5.81)
RDW: 19.1 % — ABNORMAL HIGH (ref 11.5–15.5)
WBC: 5.3 10*3/uL (ref 4.0–10.5)

## 2012-09-28 LAB — TROPONIN I
Troponin I: <0.3 ng/mL (ref <0.30)
Troponin I: <0.3 ng/mL (ref <0.30)

## 2012-09-28 MED ORDER — SODIUM CHLORIDE 0.9 % IJ SOLN
3.0000 mL | INTRAMUSCULAR | Status: DC | PRN
  Administered 2012-10-13: 3 mL via INTRAVENOUS

## 2012-09-28 MED ORDER — MULTIVITAMINS PO CAPS: 1.0000 | ORAL_CAPSULE | Freq: Every day | ORAL | Status: DC

## 2012-09-28 MED ORDER — RAMIPRIL 10 MG PO CAPS
10.0000 mg | ORAL_CAPSULE | Freq: Two times a day (BID) | ORAL | Status: DC
  Filled 2012-09-28: qty 1

## 2012-09-28 MED ORDER — DOBUTAMINE IN D5W 4-5 MG/ML-% IV SOLN
2.5000 ug/kg/min | INTRAVENOUS | Status: DC
  Administered 2012-09-28 – 2012-10-04 (×5): 5 ug/kg/min via INTRAVENOUS
  Administered 2012-10-06: 2.5 ug/kg/min via INTRAVENOUS
  Filled 2012-09-28 (×7): qty 250

## 2012-09-28 MED ORDER — FUROSEMIDE 10 MG/ML IJ SOLN
80.0000 mg | Freq: Two times a day (BID) | INTRAMUSCULAR | Status: DC
  Administered 2012-09-29 – 2012-10-01 (×5): 80 mg via INTRAVENOUS
  Filled 2012-09-28 (×6): qty 8

## 2012-09-28 MED ORDER — AMIODARONE HCL 200 MG PO TABS
400.0000 mg | ORAL_TABLET | Freq: Every day | ORAL | Status: DC
  Filled 2012-09-28: qty 2

## 2012-09-28 MED ORDER — ATORVASTATIN CALCIUM 40 MG PO TABS
40.0000 mg | ORAL_TABLET | Freq: Every day | ORAL | Status: DC
  Administered 2012-09-29 – 2012-10-18 (×20): 40 mg via ORAL
  Filled 2012-09-28 (×21): qty 1

## 2012-09-28 MED ORDER — POTASSIUM CHLORIDE CRYS ER 20 MEQ PO TBCR: 20.0000 meq | EXTENDED_RELEASE_TABLET | Freq: Two times a day (BID) | ORAL | Status: DC

## 2012-09-28 MED ORDER — ALLOPURINOL 100 MG PO TABS
100.0000 mg | ORAL_TABLET | Freq: Every day | ORAL | Status: DC
  Administered 2012-09-29 – 2012-10-18 (×20): 100 mg via ORAL
  Filled 2012-09-28 (×21): qty 1

## 2012-09-28 MED ORDER — ACETAMINOPHEN 325 MG PO TABS
650.0000 mg | ORAL_TABLET | ORAL | Status: DC | PRN
  Administered 2012-10-06: 650 mg via ORAL
  Filled 2012-09-28: qty 2

## 2012-09-28 MED ORDER — FUROSEMIDE 10 MG/ML IJ SOLN
40.0000 mg | Freq: Two times a day (BID) | INTRAMUSCULAR | Status: DC
  Administered 2012-09-28: 40 mg via INTRAVENOUS
  Filled 2012-09-28: qty 4

## 2012-09-28 MED ORDER — ADULT MULTIVITAMIN W/MINERALS CH
1.0000 | ORAL_TABLET | Freq: Every day | ORAL | Status: DC
  Administered 2012-09-29 – 2012-10-18 (×20): 1 via ORAL
  Filled 2012-09-28 (×21): qty 1

## 2012-09-28 MED ORDER — HEPARIN SODIUM (PORCINE) 5000 UNIT/ML IJ SOLN
5000.0000 [IU] | Freq: Three times a day (TID) | INTRAMUSCULAR | Status: DC
  Filled 2012-09-28 (×3): qty 1

## 2012-09-28 MED ORDER — SODIUM CHLORIDE 0.9 % IJ SOLN
3.0000 mL | Freq: Two times a day (BID) | INTRAMUSCULAR | Status: DC
  Administered 2012-09-29 – 2012-10-18 (×32): 3 mL via INTRAVENOUS

## 2012-09-28 MED ORDER — TRAZODONE HCL 150 MG PO TABS
150.0000 mg | ORAL_TABLET | Freq: Every day | ORAL | Status: DC
  Administered 2012-09-28 – 2012-10-17 (×20): 150 mg via ORAL
  Filled 2012-09-28 (×21): qty 1

## 2012-09-28 MED ORDER — WARFARIN - PHYSICIAN DOSING INPATIENT
Freq: Every day | Status: DC
  Administered 2012-09-28 – 2012-10-01 (×4)

## 2012-09-28 MED ORDER — SODIUM CHLORIDE 0.9 % IV SOLN
250.0000 mL | INTRAVENOUS | Status: DC | PRN
  Administered 2012-09-29 – 2012-10-13 (×4): 250 mL via INTRAVENOUS

## 2012-09-28 MED ORDER — SPIRONOLACTONE 25 MG PO TABS
25.0000 mg | ORAL_TABLET | Freq: Every day | ORAL | Status: DC
  Administered 2012-09-28 – 2012-09-29 (×2): 25 mg via ORAL
  Filled 2012-09-28 (×3): qty 1

## 2012-09-28 MED ORDER — ONDANSETRON HCL 4 MG/2ML IJ SOLN: 4.0000 mg | Freq: Four times a day (QID) | INTRAMUSCULAR | Status: DC | PRN

## 2012-09-28 MED ORDER — WARFARIN SODIUM 3 MG PO TABS
3.0000 mg | ORAL_TABLET | Freq: Every day | ORAL | Status: DC
  Administered 2012-09-28 – 2012-10-02 (×5): 3 mg via ORAL
  Filled 2012-09-28 (×5): qty 1

## 2012-09-28 NOTE — H&P (Signed)
[name] is an 78 y.o. male.   Chief Complaint: Leg edema and shortness of breath. HPI: 78 years old male with progressive leg edema and shortness of breath since off lasix x 1-2 months. No chest pain. Has Bi-V ICD-pacer. No fever or cough.   Past Medical History  Diagnosis Date  . Dyslipidemia   . CHF (congestive heart failure), NYHA class II   . Atrial fibrillation   . Prostate cancer       Past Surgical History  Procedure Date  . Medtronic biv icd 10/2006  . Pci/stent 02/2005  . Total knee arthroplasty     Family History  Problem Relation Age of Onset  . Diabetes Mother    Social History:  reports that he has quit smoking. He does not have any smokeless tobacco history on file. His alcohol and drug histories not on file.  Allergies: No Known Allergies  Medications Prior to Admission  Medication Sig Dispense Refill  . allopurinol (ZYLOPRIM) 100 MG tablet Take 100 mg by mouth daily.       Marland Kitchen amiodarone (PACERONE) 200 MG tablet Take 400 mg by mouth daily.       Marland Kitchen atorvastatin (LIPITOR) 40 MG tablet Take 40 mg by mouth daily.        . carvedilol (COREG CR) 40 MG 24 hr capsule Take 40 mg by mouth daily.        . DULoxetine (CYMBALTA) 60 MG capsule Take 60 mg by mouth daily.        Marland Kitchen eplerenone (INSPRA) 25 MG tablet Take 25 mg by mouth 2 (two) times daily.        . furosemide (LASIX) 80 MG tablet Take 80 mg by mouth 3 (three) times daily.       . Multiple Vitamin (MULTIVITAMINS PO) Take 1 tablet by mouth daily.       . potassium chloride SA (K-DUR,KLOR-CON) 20 MEQ tablet Take 20 mEq by mouth 2 (two) times daily.       . ramipril (ALTACE) 10 MG capsule Take 10 mg by mouth daily.       . traZODone (DESYREL) 150 MG tablet Take 150 mg by mouth at bedtime.      Marland Kitchen warfarin (COUMADIN) 3 MG tablet Take 3 mg by mouth daily.        No results found for this or any previous visit (from the past 48 hour(s)). No results found.  @ROS @ + weight gain, wears glasses, decreased  hearing, + chest pain, + leg edema, + Ca prostate, + hematuria, + A. Fibrillation,  No stroke, seizures or psych admission.  Blood pressure 119/68, pulse 50, temperature 97.3 F (36.3 C), temperature source Oral, resp. rate 20, height 5\' 11"  (1.803 m), weight 90.2 kg (198 lb 13.7 oz), SpO2 91.00%. HEENT-Normocephalic, atraumatic. Brown eyes, conj-pink, sclera-white.  Neck-supple. + JVD. Lungs - Bil. Exp. Wheeze. Heart- reg. Rhythm, bradycardia. II/VI systolic murmur. Abdomen- Soft Ext:-2 + edema up to knees. CNS- Moves all 4 ext. Cranial nerves grossly intact. Skin:- Small wound x 2 over right shin.  Assessment/Plan Acute on chronic left heart failure. Hypertension Bi-V ICD pacer. A. Fibrillation  Admit/Echo/Lab/IV lasix, Increase pacer rate to 60/min  Sunil Hue S 09/28/2012, 4:08 PM

## 2012-09-29 ENCOUNTER — Inpatient Hospital Stay (HOSPITAL_COMMUNITY): Payer: Medicare Other

## 2012-09-29 LAB — URINALYSIS, ROUTINE W REFLEX MICROSCOPIC
Glucose, UA: NEGATIVE mg/dL
Hgb urine dipstick: NEGATIVE
Ketones, ur: NEGATIVE mg/dL
Protein, ur: NEGATIVE mg/dL
Urobilinogen, UA: 1 mg/dL (ref 0.0–1.0)

## 2012-09-29 LAB — BASIC METABOLIC PANEL
BUN: 58 mg/dL — ABNORMAL HIGH (ref 6–23)
Chloride: 109 mEq/L (ref 96–112)
Creatinine, Ser: 4.06 mg/dL — ABNORMAL HIGH (ref 0.50–1.35)
GFR calc Af Amer: 15 mL/min — ABNORMAL LOW (ref >90)
GFR calc non Af Amer: 13 mL/min — ABNORMAL LOW (ref >90)
Potassium: 4.4 mEq/L (ref 3.5–5.1)

## 2012-09-29 LAB — FERRITIN: Ferritin: 104 ng/mL (ref 22–322)

## 2012-09-29 LAB — PROTIME-INR: Prothrombin Time: 24.6 seconds — ABNORMAL HIGH (ref 11.6–15.2)

## 2012-09-29 LAB — IRON AND TIBC: Iron: 50 ug/dL (ref 42–135)

## 2012-09-29 LAB — TROPONIN I: Troponin I: <0.3 ng/mL (ref <0.30)

## 2012-09-29 MED ORDER — LEVOTHYROXINE SODIUM 25 MCG PO TABS
25.0000 ug | ORAL_TABLET | Freq: Every day | ORAL | Status: DC
  Administered 2012-09-29 – 2012-10-18 (×21): 25 ug via ORAL
  Filled 2012-09-29 (×24): qty 1

## 2012-09-29 NOTE — Progress Notes (Signed)
  Echocardiogram 2D Echocardiogram has been performed.  [name] 09/29/2012, 3:39 PM

## 2012-09-29 NOTE — Consult Note (Signed)
[name] is an 77 y.o. male referred by Dr [name]   Chief Complaint: Acute on CKD4 HPI: 78 yo BM admitted 09/28/12 for SOB and edema over last 1-2 weeks.  Says he was told he had CKD but has not ever seen a nephrologist.  Had one episode of gross hematuria "years ago" but none since.  No hx of stones.  No obstructive sxs.  No NSAID's.  Has had HTN for "years" and says it has been well controlled recently.  Only old labs available are Scr 2.8-3.0 in 9/13.  Scr on admission 4.19 and today 4.06.  Past Medical History  Diagnosis Date  . Dyslipidemia   . CHF (congestive heart failure), NYHA class II   . Atrial fibrillation   . Prostate cancer   . Coronary artery disease   . Myocardial infarction   . Hypertension   . Depression   . Shortness of breath   . Chronic kidney disease     renal insufficiency  . ICD (implantable cardiac defibrillator) in place     Past Surgical History  Procedure Date  . Medtronic biv icd 10/2006  . Pci/stent 02/2005  . Total knee arthroplasty     Family History  Problem Relation Age of Onset  . Diabetes Mother    Social History:  reports that he quit smoking about 43 years ago. He has never used smokeless tobacco. He reports that he does not drink alcohol or use illicit drugs.  Allergies: No Known Allergies  Medications Prior to Admission  Medication Sig Dispense Refill  . allopurinol (ZYLOPRIM) 100 MG tablet Take 100 mg by mouth daily.       Marland Kitchen amiodarone (PACERONE) 200 MG tablet Take 400 mg by mouth daily.       Marland Kitchen atorvastatin (LIPITOR) 40 MG tablet Take 40 mg by mouth daily.        . carvedilol (COREG CR) 40 MG 24 hr capsule Take 40 mg by mouth daily.        . DULoxetine (CYMBALTA) 60 MG capsule Take 60 mg by mouth daily.        Marland Kitchen eplerenone (INSPRA) 25 MG tablet Take 25 mg by mouth 2 (two) times daily.        . furosemide (LASIX) 80 MG tablet Take 80 mg by mouth 3 (three) times daily.       . Multiple Vitamin (MULTIVITAMINS PO) Take 1 tablet  by mouth daily.       . potassium chloride SA (K-DUR,KLOR-CON) 20 MEQ tablet Take 20 mEq by mouth 2 (two) times daily.       . ramipril (ALTACE) 10 MG capsule Take 10 mg by mouth daily.       . traZODone (DESYREL) 150 MG tablet Take 150 mg by mouth at bedtime.      Marland Kitchen warfarin (COUMADIN) 3 MG tablet Take 3 mg by mouth daily.         Lab Results: UA: not done  [facility] 09/28/12 1644  WBC 5.3  HGB 11.7*  HCT 36.0*  PLT 156   BMET  Basename 09/29/12 0510 09/28/12 1644  NA 146* 147*  K 4.4 4.6  CL 109 110  CO2 27 25  GLUCOSE 79 92  BUN 58* 59*  CREATININE 4.06* 4.19*  CALCIUM 8.6 8.7  PHOS -- --   LFT  Basename 09/28/12 1644  PROT 6.9  ALBUMIN 2.6*  AST 21  ALT 20  ALKPHOS 139*  BILITOT 1.1  BILIDIR --  IBILI --   Dg Chest 2 View  09/29/2012  *RADIOLOGY REPORT*  Clinical Data: Shortness of breath, wheezing  CHEST - 2 VIEW  Comparison: Chest x-ray of 10/17/2010  Findings: There is persistent elevation of the left hemidiaphragm. There is linear atelectasis and/or small left effusion blunting the left costophrenic angle.  The right lung is clear.  Cardiomegaly stable.  A defibrillator remains.  IMPRESSION: No significant change in elevation of the left hemidiaphragm.  Left basilar atelectasis and possible small left effusion.   Original Report Authenticated By: Dwyane Dee, M.D.     ROS: Appetite marginal x 1 month + SOB No CP No change in bowels No vision change No dysuria No new neuropathic sxs   PHYSICAL EXAM: Blood pressure 110/72, pulse 60, temperature 97.1 F (36.2 C), temperature source Oral, resp. rate 20, height 5\' 11"  (1.803 m), weight 89.4 kg (197 lb 1.5 oz), SpO2 100.00%. HEENT: PERRLA EOMI NECK:+ JVD no nodes LUNGS:Bibasilar crackles CARDIAC:RRR 1/6 systolic murmur. No rub ABD:+ BS NTND no bruits  EXT:2+ edema   Wound in mid Rt pretibial region, small ant serous  drainage, not overtly infected.  + edema Lt arm NEURO:CN intact  Ox3 no  asterixis Back:  2+ presacral edema Chest: Lt upper chest AICD  Assessment: 1. Acute on CKD 4  Only thing in the history is a hx of HTN as to a cause 2. HTN 3. Volume overload 4. Probable Lt subclavian vein stenosis due to AICD causing Lt arm swelling PLAN: 1. Renal US 2. IPTH, PO4 3. Check SPEP 4. UA 5.  DC aldactone 6. Dietary consult, change to renal diet 7. PT told to avoid NSAID's 8. Avoid ACE/ARB for now 9. Check echo 10. Cont lasix 11. Daily Scr 12. 12 check iron studies   [name] T 09/29/2012, 1:02 PM

## 2012-09-29 NOTE — Plan of Care (Signed)
Problem: Food- and Nutrition-Related Knowledge Deficit (NB-1.1) Goal: Nutrition education Formal process to instruct or train a patient/client in a skill or to impart knowledge to help patients/clients voluntarily manage or modify food choices and eating behavior to maintain or improve health.  Outcome: Completed/Met Date Met:  09/29/12 Nutrition Education Note  RD consulted for Renal Education. Provided Nutrition Tips for CKD stage 5 and not on HD to patient/family. Went over low sodium diet as well as key foods to limit frequency.   Explained why diet restrictions are needed and provided lists of foods to limit/avoid that are high potassium, sodium, and phosphorus. Provided specific recommendations on safer alternatives of these foods. Strongly encouraged compliance of this diet.   Explained that if pt is to start on HD will require additional diet modifications and should speak with RD at HD center at that time.   Teach back method used.  Expect fair compliance.  Body mass index is 27.49 kg/(m^2). Pt meets criteria for overweight based on current BMI.  Current diet order is Renal 80/90 , patient is consuming approximately 50% of meals at this time. Labs and medications reviewed. No further nutrition interventions warranted at this time. RD contact information provided. If additional nutrition issues arise, please re-consult RD.  Clarene Duke RD, LDN Pager [phone] After Hours pager [phone]

## 2012-09-29 NOTE — Progress Notes (Signed)
Subjective:  Feeling better. Mild improvement in leg and left arm edema. Afebrile.  Objective:  Vital Signs in the last 24 hours: Temp:  [97.1 F (36.2 C)-98.4 F (36.9 C)] 97.1 F (36.2 C) (12/06 0935) Pulse Rate:  [50-64] 60  (12/06 0935) Cardiac Rhythm:  [-] A-V Sequential paced (12/06 0800) Resp:  [20] 20  (12/06 0535) BP: (110-137)/(64-72) 110/72 mmHg (12/06 0935) SpO2:  [91 %-100 %] 100 % (12/06 0535) Weight:  [89.4 kg (197 lb 1.5 oz)-90.2 kg (198 lb 13.7 oz)] 89.4 kg (197 lb 1.5 oz) (12/06 0535)  Physical Exam: BP Readings from Last 1 Encounters:  09/29/12 110/72    Wt Readings from Last 1 Encounters:  09/29/12 89.4 kg (197 lb 1.5 oz)    Weight change:   HEENT: [place]/AT, Eyes-Brown, PERL, EOMI, Conjunctiva-Pink, Sclera-Non-icteric Neck: No JVD, No bruit, Trachea midline. Lungs:  Clear, Bilateral. Cardiac:  Regular rhythm, normal S1 and S2, no S3.  Abdomen:  Soft, non-tender. Extremities:  2 + edema present. No cyanosis. No clubbing. Mild left hand and arm edema. CNS: AxOx3, Cranial nerves grossly intact, moves all 4 extremities. Right handed. Skin: Warm and dry.   Intake/Output from previous day: 12/05 0701 - 12/06 0700 In: 422.1 [P.O.:342; I.V.:72.1; IV Piggyback:8] Out: -     Lab Results: BMET    Component Value Date/Time   NA 146* 09/29/2012 0510   K 4.4 09/29/2012 0510   CL 109 09/29/2012 0510   CO2 27 09/29/2012 0510   GLUCOSE 79 09/29/2012 0510   GLUCOSE 117* 11/04/2006 1154   BUN 58* 09/29/2012 0510   CREATININE 4.06* 09/29/2012 0510   CALCIUM 8.6 09/29/2012 0510   GFRNONAA 13* 09/29/2012 0510   GFRAA 15* 09/29/2012 0510   CBC    Component Value Date/Time   WBC 5.3 09/28/2012 1644   RBC 4.04* 09/28/2012 1644   HGB 11.7* 09/28/2012 1644   HCT 36.0* 09/28/2012 1644   PLT 156 09/28/2012 1644   MCV 89.1 09/28/2012 1644   MCH 29.0 09/28/2012 1644   MCHC 32.5 09/28/2012 1644   RDW 19.1* 09/28/2012 1644   LYMPHSABS 0.9 09/28/2012 1644   MONOABS 0.6 09/28/2012 1644   EOSABS 0.1 09/28/2012 1644   BASOSABS 0.0 09/28/2012 1644   CARDIAC ENZYMES Lab Results  Component Value Date   CKTOTAL 110 10/17/2010   CKMB 2.5 10/17/2010   TROPONINI <0.30 09/29/2012    Assessment/Plan:  Patient Active Hospital Problem List: Acute on chronic left heart systolic failure.  Hypertension  Bi-V ICD pacer.  A. Fibrillation Acute on chronic renal failure Anemia r/o iron deficiency Hypothyroidism  Renal consult. Off Ramipril now. Decrease amiodarone by 50 %. 25 mcg. Synthroid Check S. Iron, TIBC and Stool for occult blood. Renal panel and uric acid level in AM   LOS: 1 day    [name]  MD  09/29/2012, 9:53 AM

## 2012-09-30 LAB — RENAL FUNCTION PANEL
CO2: 25 mEq/L (ref 19–32)
Chloride: 109 mEq/L (ref 96–112)
GFR calc Af Amer: 16 mL/min — ABNORMAL LOW (ref >90)
Glucose, Bld: 85 mg/dL (ref 70–99)
Phosphorus: 3.2 mg/dL (ref 2.3–4.6)
Potassium: 3.8 mEq/L (ref 3.5–5.1)
Sodium: 145 mEq/L (ref 135–145)

## 2012-09-30 MED ORDER — ALBUMIN HUMAN 25 % IV SOLN
12.5000 g | Freq: Once | INTRAVENOUS | Status: AC
  Administered 2012-09-30: 12.5 g via INTRAVENOUS
  Filled 2012-09-30: qty 50

## 2012-09-30 NOTE — Progress Notes (Signed)
Subjective:  Feeling better. No chest pain. Afebrile.  Objective:  Vital Signs in the last 24 hours: Temp:  [97.1 F (36.2 C)-98.6 F (37 C)] 98.3 F (36.8 C) (12/07 0455) Pulse Rate:  [60-64] 64  (12/07 0455) Cardiac Rhythm:  [-] A-V Sequential paced (12/07 0800) Resp:  [18] 18  (12/07 0455) BP: (100-110)/(66-76) 103/76 mmHg (12/07 0455) SpO2:  [96 %-98 %] 96 % (12/07 0455) Weight:  [90.3 kg (199 lb 1.2 oz)] 90.3 kg (199 lb 1.2 oz) (12/07 0455)  Physical Exam: BP Readings from Last 1 Encounters:  09/30/12 103/76    Wt Readings from Last 1 Encounters:  09/30/12 90.3 kg (199 lb 1.2 oz)    Weight change: 0.1 kg (3.5 oz)  HEENT: [place]/AT, Eyes-Brown, PERL, EOMI, Conjunctiva-Pink, Sclera-Non-icteric Neck: + JVD, No bruit, Trachea midline. Lungs:  Clear, Bilateral. Cardiac:  Regular rhythm, normal S1 and S2, no S3.  Abdomen:  Soft, non-tender. Extremities:  2 + edema present. No cyanosis. No clubbing. Mild left forearm and arm edema. CNS: AxOx3, Cranial nerves grossly intact, moves all 4 extremities. Right handed. Skin: Warm and dry.   Intake/Output from previous day: 12/06 0701 - 12/07 0700 In: 1183.9 [P.O.:840; I.V.:327.9; IV Piggyback:16] Out: 1150 [Urine:1150]    Lab Results: BMET    Component Value Date/Time   NA 145 09/30/2012 0440   K 3.8 09/30/2012 0440   CL 109 09/30/2012 0440   CO2 25 09/30/2012 0440   GLUCOSE 85 09/30/2012 0440   GLUCOSE 117* 11/04/2006 1154   BUN 56* 09/30/2012 0440   CREATININE 3.84* 09/30/2012 0440   CALCIUM 8.4 09/30/2012 0440   GFRNONAA 14* 09/30/2012 0440   GFRAA 16* 09/30/2012 0440   CBC    Component Value Date/Time   WBC 5.3 09/28/2012 1644   RBC 4.04* 09/28/2012 1644   HGB 11.7* 09/28/2012 1644   HCT 36.0* 09/28/2012 1644   PLT 156 09/28/2012 1644   MCV 89.1 09/28/2012 1644   MCH 29.0 09/28/2012 1644   MCHC 32.5 09/28/2012 1644   RDW 19.1* 09/28/2012 1644   LYMPHSABS 0.9 09/28/2012 1644   MONOABS 0.6 09/28/2012 1644   EOSABS 0.1 09/28/2012  1644   BASOSABS 0.0 09/28/2012 1644   CARDIAC ENZYMES Lab Results  Component Value Date   CKTOTAL 110 10/17/2010   CKMB 2.5 10/17/2010   TROPONINI <0.30 09/29/2012    Assessment/Plan:  Patient Active Hospital Problem List: Acute on chronic left heart systolic failure.  Hypertension  Bi-V ICD pacer.  A. Fibrillation  Acute on chronic renal failure  Anemia r/o iron deficiency  Hypothyroidism Hypoalbuminemia  Continue medical treatment    LOS: 2 days    [name]  MD  09/30/2012, 9:31 AM

## 2012-09-30 NOTE — Progress Notes (Signed)
Pt tolerated albumin 12.5g well.  VS prior  T 98.1, p60, R 18, BP 103/55 and 97.5, , P60, R18, BP 132/75 post albumin.  Amanda Pea, Charity fundraiser.

## 2012-09-30 NOTE — Progress Notes (Signed)
S: eating well  No SOB O:BP 103/76  Pulse 64  Temp 98.3 F (36.8 C) (Oral)  Resp 18  Ht 5\' 11"  (1.803 m)  Wt 90.3 kg (199 lb 1.2 oz)  BMI 27.77 kg/m2  SpO2 96%  Intake/Output Summary (Last 24 hours) at 09/30/12 0810 Last data filed at 09/30/12 0700  Gross per 24 hour  Intake 1183.87 ml  Output   1150 ml  Net  33.87 ml   Weight change: 0.1 kg (3.5 oz) MWU:XLKGM and alert CVS:RRR Resp:decreased BS bases Abd:+ BS NTND Ext:+ edema NEURO:CNI OX3 no asterixis      . allopurinol  100 mg Oral Daily  . atorvastatin  40 mg Oral Daily  . furosemide  80 mg Intravenous Q12H  . levothyroxine  25 mcg Oral QAC breakfast  . multivitamin with minerals  1 tablet Oral Daily  . sodium chloride  3 mL Intravenous Q12H  . traZODone  150 mg Oral QHS  . warfarin  3 mg Oral Daily  . Warfarin - Physician Dosing Inpatient   Does not apply q1800  . [DISCONTINUED] spironolactone  25 mg Oral Daily   Dg Chest 2 View  09/29/2012  *RADIOLOGY REPORT*  Clinical Data: Shortness of breath, wheezing  CHEST - 2 VIEW  Comparison: Chest x-ray of 10/17/2010  Findings: There is persistent elevation of the left hemidiaphragm. There is linear atelectasis and/or small left effusion blunting the left costophrenic angle.  The right lung is clear.  Cardiomegaly stable.  A defibrillator remains.  IMPRESSION: No significant change in elevation of the left hemidiaphragm.  Left basilar atelectasis and possible small left effusion.   Original Report Authenticated By: Dwyane Dee, M.D.    US Renal  09/29/2012  *RADIOLOGY REPORT*  Clinical Data: History of acute renal failure on chronic renal disease. History of hypertension.  History of prostatic carcinoma.  RENAL/URINARY TRACT ULTRASOUND COMPLETE  Comparison:  10/02/2007 study.  Findings:  Right Kidney:  Right renal length is 9.8 cm. Two right renal cysts are seen.  The cysts appear simple.  They measure 1.0 x 1.0 x 0.8 cm and 1.2 x 1.1 x 1.0 cm respectively. On previous study  these cysts measured 1.1 x 0.9 x 0.8 cm and 1.0 x 0.9 of 0.8 centimeters respectively.  Left Kidney:  Left renal length is 12.5 cm. There is a cyst involving the left kidney.  The cyst measures 5.1 x 5.0 x 4.7 cm. The cyst appears simple. On previous study it measured 5.2 x 4.4 x 3.7 cm.  Examination of each kidney shows no evidence of hydronephrosis, solid mass, or calculus.  Examination of each kidney demonstrates increased echogenicity of the renal parenchyma consistent with medical renal disease.  Bladder: No urinary bladder abnormality is seen.  Small bilateral pleural effusions are present.  There is ascites.  IMPRESSION: Bilateral renal cysts appears simple.  Increased echogenicity of each kidney diffusely consistent with medical renal disease.  No solid mass evident.  No hydronephrosis evident.  Ascites.  Bilateral pleural effusions.   Original Report Authenticated By: Onalee Hua Call    BMET    Component Value Date/Time   NA 145 09/30/2012 0440   K 3.8 09/30/2012 0440   CL 109 09/30/2012 0440   CO2 25 09/30/2012 0440   GLUCOSE 85 09/30/2012 0440   GLUCOSE 117* 11/04/2006 1154   BUN 56* 09/30/2012 0440   CREATININE 3.84* 09/30/2012 0440   CALCIUM 8.4 09/30/2012 0440   GFRNONAA 14* 09/30/2012 0440   GFRAA 16* 09/30/2012  0440   CBC    Component Value Date/Time   WBC 5.3 09/28/2012 1644   RBC 4.04* 09/28/2012 1644   HGB 11.7* 09/28/2012 1644   HCT 36.0* 09/28/2012 1644   PLT 156 09/28/2012 1644   MCV 89.1 09/28/2012 1644   MCH 29.0 09/28/2012 1644   MCHC 32.5 09/28/2012 1644   RDW 19.1* 09/28/2012 1644   LYMPHSABS 0.9 09/28/2012 1644   MONOABS 0.6 09/28/2012 1644   EOSABS 0.1 09/28/2012 1644   BASOSABS 0.0 09/28/2012 1644     Assessment:  1. Acute of CKD4  Scr improving.  UA benign  Renal US echogenic kidneys.  UO good 2. Hx hypertension, but BP lowish now\ 3. Vol overload on lasix 4.  Probable Lt subclavian V. Stenosis 5. Mild anemia, iron studies OK  Plan: 1. Cont diuresis 2. Await rest of  labs 3. Recheck bmet in am    Minnette Merida T

## 2012-09-30 NOTE — Progress Notes (Signed)
Pt with Albumin order x1 today.  Currently have dobutamine Iv running.  Dr. Algie Coffer notified that pt only has one iv line and dobutamine is not compartable with albumin and if another  iv line can be started.  MD instructed can stop dobutamin running for 2hrs to give albumin.  Pt informed and agreed. Amanda Pea, Charity fundraiser.

## 2012-10-01 LAB — RENAL FUNCTION PANEL
Albumin: 2.4 g/dL — ABNORMAL LOW (ref 3.5–5.2)
BUN: 55 mg/dL — ABNORMAL HIGH (ref 6–23)
GFR calc non Af Amer: 13 mL/min — ABNORMAL LOW (ref >90)
Phosphorus: 3.3 mg/dL (ref 2.3–4.6)
Potassium: 3.4 mEq/L — ABNORMAL LOW (ref 3.5–5.1)
Sodium: 144 mEq/L (ref 135–145)

## 2012-10-01 LAB — PROTIME-INR
INR: 2.68 — ABNORMAL HIGH (ref 0.00–1.49)
Prothrombin Time: 27.2 seconds — ABNORMAL HIGH (ref 11.6–15.2)

## 2012-10-01 MED ORDER — FUROSEMIDE 10 MG/ML IJ SOLN
160.0000 mg | Freq: Two times a day (BID) | INTRAVENOUS | Status: DC
  Administered 2012-10-01 – 2012-10-06 (×10): 160 mg via INTRAVENOUS
  Filled 2012-10-01 (×13): qty 16

## 2012-10-01 MED ORDER — POTASSIUM CHLORIDE CRYS ER 10 MEQ PO TBCR
10.0000 meq | EXTENDED_RELEASE_TABLET | Freq: Two times a day (BID) | ORAL | Status: DC
  Administered 2012-10-01 – 2012-10-02 (×4): 10 meq via ORAL
  Filled 2012-10-01 (×7): qty 1

## 2012-10-01 NOTE — Progress Notes (Signed)
Utilization review completed.  

## 2012-10-01 NOTE — Progress Notes (Signed)
S: eating well  No SOB O:BP 115/59  Pulse 60  Temp 98 F (36.7 C) (Oral)  Resp 16  Ht 5\' 11"  (1.803 m)  Wt 90.3 kg (199 lb 1.2 oz)  BMI 27.77 kg/m2  SpO2 100%  Intake/Output Summary (Last 24 hours) at 10/01/12 0906 Last data filed at 10/01/12 0557  Gross per 24 hour  Intake  631.2 ml  Output    925 ml  Net -293.8 ml   Weight change: 0 kg (0 lb) BMW:UXLKG and alert CVS:RRR Resp:decreased BS bases Abd:+ BS NTND Ext:+ edema NEURO:CNI OX3 no asterixis      . [COMPLETED] albumin human  12.5 g Intravenous Once  . allopurinol  100 mg Oral Daily  . atorvastatin  40 mg Oral Daily  . furosemide  80 mg Intravenous Q12H  . levothyroxine  25 mcg Oral QAC breakfast  . multivitamin with minerals  1 tablet Oral Daily  . sodium chloride  3 mL Intravenous Q12H  . traZODone  150 mg Oral QHS  . warfarin  3 mg Oral Daily  . Warfarin - Physician Dosing Inpatient   Does not apply q1800   US Renal  09/29/2012  *RADIOLOGY REPORT*  Clinical Data: History of acute renal failure on chronic renal disease. History of hypertension.  History of prostatic carcinoma.  RENAL/URINARY TRACT ULTRASOUND COMPLETE  Comparison:  10/02/2007 study.  Findings:  Right Kidney:  Right renal length is 9.8 cm. Two right renal cysts are seen.  The cysts appear simple.  They measure 1.0 x 1.0 x 0.8 cm and 1.2 x 1.1 x 1.0 cm respectively. On previous study these cysts measured 1.1 x 0.9 x 0.8 cm and 1.0 x 0.9 of 0.8 centimeters respectively.  Left Kidney:  Left renal length is 12.5 cm. There is a cyst involving the left kidney.  The cyst measures 5.1 x 5.0 x 4.7 cm. The cyst appears simple. On previous study it measured 5.2 x 4.4 x 3.7 cm.  Examination of each kidney shows no evidence of hydronephrosis, solid mass, or calculus.  Examination of each kidney demonstrates increased echogenicity of the renal parenchyma consistent with medical renal disease.  Bladder: No urinary bladder abnormality is seen.  Small bilateral pleural  effusions are present.  There is ascites.  IMPRESSION: Bilateral renal cysts appears simple.  Increased echogenicity of each kidney diffusely consistent with medical renal disease.  No solid mass evident.  No hydronephrosis evident.  Ascites.  Bilateral pleural effusions.   Original Report Authenticated By: Onalee Hua Call    BMET    Component Value Date/Time   NA 144 10/01/2012 0452   K 3.4* 10/01/2012 0452   CL 108 10/01/2012 0452   CO2 29 10/01/2012 0452   GLUCOSE 94 10/01/2012 0452   GLUCOSE 117* 11/04/2006 1154   BUN 55* 10/01/2012 0452   CREATININE 3.89* 10/01/2012 0452   CALCIUM 8.2* 10/01/2012 0452   GFRNONAA 13* 10/01/2012 0452   GFRAA 15* 10/01/2012 0452   CBC    Component Value Date/Time   WBC 5.3 09/28/2012 1644   RBC 4.04* 09/28/2012 1644   HGB 11.7* 09/28/2012 1644   HCT 36.0* 09/28/2012 1644   PLT 156 09/28/2012 1644   MCV 89.1 09/28/2012 1644   MCH 29.0 09/28/2012 1644   MCHC 32.5 09/28/2012 1644   RDW 19.1* 09/28/2012 1644   LYMPHSABS 0.9 09/28/2012 1644   MONOABS 0.6 09/28/2012 1644   EOSABS 0.1 09/28/2012 1644   BASOSABS 0.0 09/28/2012 1644     Assessment:  1. Acute of CKD4  Scr improving.  UA benign  Renal US echogenic kidneys.  PTH and SPEP pending 2. Hx hypertension, but BP lowish now\ 3. Vol overload on lasix 4.  Probable Lt subclavian V. Stenosis 5. Mild anemia, iron studies OK  Plan: 1. Increase lasix dose 2.  Await rest of labs 3.recheck Scr in AM    Marsean Elkhatib T

## 2012-10-02 ENCOUNTER — Inpatient Hospital Stay (HOSPITAL_COMMUNITY): Payer: Medicare Other

## 2012-10-02 DIAGNOSIS — T8189XA Other complications of procedures, not elsewhere classified, initial encounter: Secondary | ICD-10-CM

## 2012-10-02 LAB — RENAL FUNCTION PANEL
Calcium: 8.1 mg/dL — ABNORMAL LOW (ref 8.4–10.5)
GFR calc Af Amer: 18 mL/min — ABNORMAL LOW (ref >90)
Glucose, Bld: 97 mg/dL (ref 70–99)
Phosphorus: 2.3 mg/dL (ref 2.3–4.6)
Sodium: 143 mEq/L (ref 135–145)

## 2012-10-02 MED ORDER — WARFARIN SODIUM 3 MG PO TABS
3.0000 mg | ORAL_TABLET | Freq: Every day | ORAL | Status: DC
  Administered 2012-10-03 – 2012-10-08 (×6): 3 mg via ORAL
  Filled 2012-10-02 (×7): qty 1

## 2012-10-02 MED ORDER — VANCOMYCIN HCL 10 G IV SOLR
1500.0000 mg | Freq: Once | INTRAVENOUS | Status: AC
  Administered 2012-10-02: 1500 mg via INTRAVENOUS
  Filled 2012-10-02: qty 1500

## 2012-10-02 MED ORDER — POTASSIUM CHLORIDE CRYS ER 20 MEQ PO TBCR
40.0000 meq | EXTENDED_RELEASE_TABLET | Freq: Once | ORAL | Status: AC
  Administered 2012-10-02: 40 meq via ORAL
  Filled 2012-10-02: qty 2

## 2012-10-02 MED ORDER — VANCOMYCIN HCL IN DEXTROSE 1-5 GM/200ML-% IV SOLN
1000.0000 mg | INTRAVENOUS | Status: DC
  Filled 2012-10-02: qty 200

## 2012-10-02 MED ORDER — WARFARIN - PHARMACIST DOSING INPATIENT
Freq: Every day | Status: DC
  Administered 2012-10-07 – 2012-10-09 (×2): 1

## 2012-10-02 NOTE — Progress Notes (Signed)
ANTIBIOTIC AND ANTICOAGULATION CONSULT NOTE - INITIAL  Pharmacy Consult for Vancomycin and Coumadin Indication: Leg ulcer; Afib  No Known Allergies  Patient Measurements: Height: 5\' 11"  (180.3 cm) Weight: 198 lb (89.812 kg) (scale a) IBW/kg (Calculated) : 75.3  Adjusted Body Weight:   Vital Signs: Temp: 98.2 F (36.8 C) (12/09 0535) Temp src: Oral (12/09 0535) BP: 102/51 mmHg (12/09 0535) Pulse Rate: 59  (12/09 0535) Intake/Output from previous day: 12/08 0701 - 12/09 0700 In: 803 [P.O.:800; I.V.:3] Out: 2376 [Urine:2375; Stool:1] Intake/Output from this shift: Total I/O In: 240 [P.O.:240] Out: 550 [Urine:550]  Labs:  [facility] 10/02/12 0710 10/01/12 0452 09/30/12 0440  WBC -- -- --  HGB -- -- --  PLT -- -- --  LABCREA -- -- --  CREATININE 3.41* 3.89* 3.84*   Estimated Creatinine Clearance: 18.4 ml/min (by C-G formula based on Cr of 3.41). No results found for this basename: VANCOTROUGH:2,VANCOPEAK:2,VANCORANDOM:2,GENTTROUGH:2,GENTPEAK:2,GENTRANDOM:2,TOBRATROUGH:2,TOBRAPEAK:2,TOBRARND:2,AMIKACINPEAK:2,AMIKACINTROU:2,AMIKACIN:2, in the last 72 hours   Microbiology: No results found for this or any previous visit (from the past 720 hour(s)).  Medical History: Past Medical History  Diagnosis Date  . Dyslipidemia   . CHF (congestive heart failure), NYHA class II   . Atrial fibrillation   . Prostate cancer   . Coronary artery disease   . Myocardial infarction   . Hypertension   . Depression   . Shortness of breath   . Chronic kidney disease     renal insufficiency  . ICD (implantable cardiac defibrillator) in place     Medications:  Prescriptions prior to admission  Medication Sig Dispense Refill  . allopurinol (ZYLOPRIM) 100 MG tablet Take 100 mg by mouth daily.       Marland Kitchen amiodarone (PACERONE) 200 MG tablet Take 400 mg by mouth daily.       Marland Kitchen atorvastatin (LIPITOR) 40 MG tablet Take 40 mg by mouth daily.        . carvedilol (COREG CR) 40 MG 24 hr capsule  Take 40 mg by mouth daily.        . DULoxetine (CYMBALTA) 60 MG capsule Take 60 mg by mouth daily.        Marland Kitchen eplerenone (INSPRA) 25 MG tablet Take 25 mg by mouth 2 (two) times daily.        . furosemide (LASIX) 80 MG tablet Take 80 mg by mouth 3 (three) times daily.       . Multiple Vitamin (MULTIVITAMINS PO) Take 1 tablet by mouth daily.       . potassium chloride SA (K-DUR,KLOR-CON) 20 MEQ tablet Take 20 mEq by mouth 2 (two) times daily.       . ramipril (ALTACE) 10 MG capsule Take 10 mg by mouth daily.       . traZODone (DESYREL) 150 MG tablet Take 150 mg by mouth at bedtime.      Marland Kitchen warfarin (COUMADIN) 3 MG tablet Take 3 mg by mouth daily.       Assessment: 1. 82yom to start Vancomycin for non-healing leg ulcer. Patient is currently afebrile and WBC wnl.  - Weight: 90kg - CrCl: 18 ml/min  2. Patient is also continuing Coumadin for hx Afib. No INR was collected this AM but the INR has been therapeutic the last 4 days of PTA Coumadin regimen (3 mg daily) - will continue. - No CBC this AM - No significant bleeding reported  Goal of Therapy:  Vancomycin trough level 10-15 mcg/ml INR 2-3  Plan:  1. Vancomycin 1500mg  IV x 1, then  1g q48h 2. Continue Coumadin 3mg  po daily @ 1800 3. Daily INR 4. Follow-up renal function, ulcer status and order trough at steady state  Emmit, Oriley [phone] 10/02/2012,12:59 PM

## 2012-10-02 NOTE — Consult Note (Signed)
Reason for Consult:Non healing leg wound x 2 months Referring Physician: Jerid [name] is an 78 y.o. male.  HPI: Pt fell at home on concrete and wounds have not healed in 2 months, according to patient and wife at bedside. Pt could have a piece of gravel imbedded under skin level.  Wound type: .3X.3X.3cm, .5X.5X.3cm, .8X.8X.3cm, .3X.3X.3cm. .2X.2X.2cm  Wound bed: dark red wound beds, fluctuant when palpated  Drainage (amount, consistency, odor) Mod thick tan-red drainage, bone palpable when swab inserted. We are asked to see on consult, for possible surgical intervention.  Past Medical History  Diagnosis Date  . Dyslipidemia   . CHF (congestive heart failure), NYHA class II   . Atrial fibrillation   . Prostate cancer   . Coronary artery disease   . Myocardial infarction   . Hypertension   . Depression   . Shortness of breath   . Chronic kidney disease     renal insufficiency  . ICD (implantable cardiac defibrillator) in place     Past Surgical History  Procedure Date  . Medtronic biv icd 10/2006  . Pci/stent 02/2005  . Total knee arthroplasty     Family History  Problem Relation Age of Onset  . Diabetes Mother     Social History:  reports that he quit smoking about 43 years ago. He has never used smokeless tobacco. He reports that he does not drink alcohol or use illicit drugs.  Allergies: No Known Allergies  Medications: I have reviewed the patient's current medications.  Results for orders placed during the hospital encounter of 09/28/12 (from the past 48 hour(s))  PROTIME-INR     Status: Abnormal   Collection Time   10/01/12  4:52 AM      Component Value Range Comment   Prothrombin Time 27.2 (*) 11.6 - 15.2 seconds    INR 2.68 (*) 0.00 - 1.49   RENAL FUNCTION PANEL     Status: Abnormal   Collection Time   10/01/12  4:52 AM      Component Value Range Comment   Sodium 144  135 - 145 mEq/L    Potassium 3.4 (*) 3.5 - 5.1 mEq/L    Chloride 108  96 - 112  mEq/L    CO2 29  19 - 32 mEq/L    Glucose, Bld 94  70 - 99 mg/dL    BUN 55 (*) 6 - 23 mg/dL    Creatinine, Ser 1.61 (*) 0.50 - 1.35 mg/dL    Calcium 8.2 (*) 8.4 - 10.5 mg/dL    Phosphorus 3.3  2.3 - 4.6 mg/dL    Albumin 2.4 (*) 3.5 - 5.2 g/dL    GFR calc non Af Amer 13 (*) >90 mL/min    GFR calc Af Amer 15 (*) >90 mL/min   RENAL FUNCTION PANEL     Status: Abnormal   Collection Time   10/02/12  7:10 AM      Component Value Range Comment   Sodium 143  135 - 145 mEq/L    Potassium 3.0 (*) 3.5 - 5.1 mEq/L    Chloride 106  96 - 112 mEq/L    CO2 26  19 - 32 mEq/L    Glucose, Bld 97  70 - 99 mg/dL    BUN 55 (*) 6 - 23 mg/dL    Creatinine, Ser 0.96 (*) 0.50 - 1.35 mg/dL    Calcium 8.1 (*) 8.4 - 10.5 mg/dL    Phosphorus 2.3  2.3 - 4.6 mg/dL  Albumin 2.2 (*) 3.5 - 5.2 g/dL    GFR calc non Af Amer 16 (*) >90 mL/min    GFR calc Af Amer 18 (*) >90 mL/min     No results found.  Review of Systems  Constitutional: Negative.   HENT: Negative.   Eyes: Negative.   Respiratory: Negative.   Cardiovascular: Positive for orthopnea. Negative for chest pain, palpitations, claudication, leg swelling and PND.       Positive Hx for CAD, pacemaker, stents.  Gastrointestinal: Negative.   Genitourinary: Negative.   Musculoskeletal: Negative.   Skin:       Non healing leg wound right mid tibial region. Injury occurred 2 months ago. ? Gravel embeded  Neurological: Negative.   Endo/Heme/Allergies: Negative.   Psychiatric/Behavioral: Negative.    Blood pressure 102/51, pulse 59, temperature 98.2 F (36.8 C), temperature source Oral, resp. rate 20, height 5\' 11"  (1.803 m), weight 198 lb (89.812 kg), SpO2 96.00%. Physical Exam  Constitutional: He is oriented to person, place, and time. He appears well-developed and well-nourished. No distress.  HENT:  Head: Normocephalic and atraumatic.  Mouth/Throat: No oropharyngeal exudate.  Eyes: Conjunctivae normal and EOM are normal. Pupils are equal, round,  and reactive to light. Right eye exhibits no discharge. Left eye exhibits no discharge. No scleral icterus.  Neck: Normal range of motion. Neck supple. No JVD present. No tracheal deviation present. No thyromegaly present.  Cardiovascular: Normal rate, regular rhythm, normal heart sounds and intact distal pulses.   Respiratory: Effort normal and breath sounds normal. No stridor. No respiratory distress. He has no wheezes. He has no rales. He exhibits no tenderness.  GI: Soft. Bowel sounds are normal. He exhibits no distension and no mass. There is no tenderness. There is no rebound and no guarding.  Musculoskeletal: Normal range of motion. He exhibits edema and tenderness.       Lower leg edema and tenderness over right tibial wound area  Lymphadenopathy:    He has no cervical adenopathy.  Neurological: He is alert and oriented to person, place, and time.  Skin: Skin is warm and dry. No rash noted. He is not diaphoretic. No erythema. No pallor.       Right tibal area, periwound darkened skin tone, painful to touch.  Psychiatric: He has a normal mood and affect.    Assessment/Plan:  Patient Active Problem List  Diagnosis  . CARDIOMYOPATHY, ISCHEMIC  . ATRIAL FIBRILLATION  . CHRONIC SYSTOLIC HEART FAILURE  . ICD-Medtronic  Non healing leg wound x 2 months. ? Osteomyelitis secondary to above.  Plan: 1. Wound care's input is appreciated, agree with their management of wound, as well as obtaining imaging and culture of wound. 2. Dependent upon imaging studies ? If surgical debridement is indicated. Suspect that there is some degree of PVD hindering wound healing (given CAD hx)  this may need to be addressed by vascular consult. (patient + Hx of claudication) 3. Agree with ABX for now on assumption of + MRSA (adjust abx therapy as cultures dictate) 4. Follow clinical presentation.  We will follow, thank you for the consult.   Blenda Mounts ACNP [facility] Surgery Pager #  [phone]  10/02/2012, 2:00 PM

## 2012-10-02 NOTE — Progress Notes (Signed)
Assessment:  1. Acute of CKD4 Scr improving. UA benign Renal US echogenic kidneys. PTH and SPEP pending  2. Hx hypertension, but BP low now  3. Vol overload on lasix  4. 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 Mild anemia, iron studies OK  6. Hypokalemia Plan:  1. cont lasix,   2. Await rest of labs  3. K replace  Subjective: Interval History:Breathing better  Objective: Vital signs in last 24 hours: Temp:  [97.8 F (36.6 C)-98.2 F (36.8 C)] 98.2 F (36.8 C) (12/09 0535) Pulse Rate:  [59-61] 59  (12/09 0535) Resp:  [20] 20  (12/09 0535) BP: (102-111)/(51-72) 102/51 mmHg (12/09 0535) SpO2:  [96 %-100 %] 96 % (12/09 0535) Weight:  [89.812 kg (198 lb)] 89.812 kg (198 lb) (12/09 0535) Weight change: -0.488 kg (-1 lb 1.2 oz)  Intake/Output from previous day: 12/08 0701 - 12/09 0700 In: 803 [P.O.:800; I.V.:3] Out: 2376 [Urine:2375; Stool:1] Intake/Output this shift: Total I/O In: 240 [P.O.:240] Out: -   General appearance: alert, cooperative and appears stated age Resp: diminished breath sounds bilaterally Cardio: regular rate and rhythm, S1, S2 normal, no murmur, click, rub or gallop Extremities: edema 2+ and  Incision/Wound:  RLE with bloody bandage  Lab Results: No results found for this basename: WBC:2,HGB:2,HCT:2,PLT:2 in the last 72 hours BMET:  [facility] 10/02/12 0710 10/01/12 0452  NA 143 144  K 3.0* 3.4*  CL 106 108  CO2 26 29  GLUCOSE 97 94  BUN 55* 55*  CREATININE 3.41* 3.89*  CALCIUM 8.1* 8.2*   No results found for this basename: PTH:2 in the last 72 hours Iron Studies:  Basename 09/29/12 1129  IRON 50  TIBC 211*  TRANSFERRIN --  FERRITIN 104   Studies/Results: No results found.  Scheduled:   . allopurinol  100 mg Oral Daily  . atorvastatin  40 mg Oral Daily  . furosemide  160 mg Intravenous Q12H  . levothyroxine  25 mcg Oral QAC breakfast  . multivitamin with minerals  1 tablet Oral Daily  . potassium chloride  10 mEq Oral BID  .  sodium chloride  3 mL Intravenous Q12H  . traZODone  150 mg Oral QHS  . warfarin  3 mg Oral Daily  . Warfarin - Physician Dosing Inpatient   Does not apply q1800     LOS: 4 days   Elleanna Melling C 10/02/2012,11:20 AM

## 2012-10-02 NOTE — Consult Note (Signed)
WOC consult Note Reason for Consult: Consult requested for right leg chronic wounds.  Pt fell at home on concrete and wounds have not healed in 2 months, according to patient and wife at bedside.  Pt could have a piece of gravel imbedded under skin level. Wound type: .3X.3X.3cm, .5X.5X.3cm, .8X.8X.3cm, .3X.3X.3cm. .2X.2X.2cm Wound bed: dark red wound beds, fluctuant when palpated Drainage (amount, consistency, odor) Mod thick tan-red drainage, bone palpable when swab inserted. Periwound: Dark toned skin, painful when touched.  Suspect that wound might tunnel and communicate under surface. Dressing procedure/placement/frequency:  Plan: Pt could benefit from x -ray or CT scan to R/O pocket of fluid or infection and wound culture to R/O possible MRSA.  Discussed plan of care with primary team.  He states he plans to request a surgical consult.  Aquacel to absorb drainage and provide antimicrobial benefits until further input from surgical team.   Cammie Mcgee, RN, MSN, CWOCN  [phone]

## 2012-10-02 NOTE — Progress Notes (Signed)
Subjective:  Denies any chest pain states breathing is improved  Objective:  Vital Signs in the last 24 hours: Temp:  [97.8 F (36.6 C)-98.2 F (36.8 C)] 98.2 F (36.8 C) (12/09 0535) Pulse Rate:  [59-61] 59  (12/09 0535) Resp:  [20] 20  (12/09 0535) BP: (102-111)/(51-72) 102/51 mmHg (12/09 0535) SpO2:  [96 %-100 %] 96 % (12/09 0535) Weight:  [89.812 kg (198 lb)] 89.812 kg (198 lb) (12/09 0535)  Intake/Output from previous day: 12/08 0701 - 12/09 0700 In: 803 [P.O.:800; I.V.:3] Out: 2376 [Urine:2375; Stool:1] Intake/Output from this shift: Total I/O In: 240 [P.O.:240] Out: 550 [Urine:550]  Physical Exam: Neck: no adenopathy, no carotid bruit and supple, symmetrical, trachea midline Lungs: Decreased breath sound at bases Heart: regular rate and rhythm, S1, S2 normal and Soft systolic murmur and S3 gallop noted Abdomen: soft, non-tender; bowel sounds normal; no masses,  no organomegaly Extremities: Right leg nonhealing ulcer noted with purulent drainage  Lab Results: No results found for this basename: WBC:2,HGB:2,PLT:2 in the last 72 hours  Basename 10/02/12 0710 10/01/12 0452  NA 143 144  K 3.0* 3.4*  CL 106 108  CO2 26 29  GLUCOSE 97 94  BUN 55* 55*  CREATININE 3.41* 3.89*   No results found for this basename: TROPONINI:2,CK,MB:2 in the last 72 hours Hepatic Function Panel  Basename 10/02/12 0710  PROT --  ALBUMIN 2.2*  AST --  ALT --  ALKPHOS --  BILITOT --  BILIDIR --  IBILI --   No results found for this basename: CHOL in the last 72 hours No results found for this basename: PROTIME in the last 72 hours  Imaging: Imaging results have been reviewed and No results found.  Cardiac Studies:  Assessment/Plan:  Resolving decompensated systolic heart failure Coronary artery disease history of anteroseptal wall MI in the past Ischemic dilated cardiomyopathy status post by V. ICD Atrial fibrillation Chronic kidney disease stage  IV Anemia Hypothyroidism Hyperalbuminemia Chronic nonhealing ulcer right leg Left arm swelling rule out DVT left arm Plan Wound care consult Surgical consult Start vancomycin per pharmacy Coumadin per pharmacy  LOS: 4 days    Lashann Hagg N 10/02/2012, 12:25 PM

## 2012-10-02 NOTE — Progress Notes (Signed)
Wound nurse Dunn, RN notified of wound consult.  Instructed she will be here later.  Amanda Pea, Charity fundraiser.

## 2012-10-02 NOTE — Progress Notes (Signed)
Pt not able to keep BP cuff on for frequent VS post cath.Had removed it while getting OOB to chair for lunch and on phone .  BP 96/50 manually.P 62, Temp 98.4, orally. RR 18.  Will continue to monitor.  Amanda Pea, Charity fundraiser.

## 2012-10-03 LAB — PROTEIN ELECTROPHORESIS, SERUM
Alpha-2-Globulin: 10.1 % (ref 7.1–11.8)
Beta 2: 9.4 % — ABNORMAL HIGH (ref 3.2–6.5)
Beta Globulin: 5.9 % (ref 4.7–7.2)
Gamma Globulin: 26.2 % — ABNORMAL HIGH (ref 11.1–18.8)
M-Spike, %: NOT DETECTED g/dL

## 2012-10-03 LAB — RENAL FUNCTION PANEL
CO2: 29 mEq/L (ref 19–32)
Calcium: 8.2 mg/dL — ABNORMAL LOW (ref 8.4–10.5)
GFR calc Af Amer: 19 mL/min — ABNORMAL LOW (ref >90)
GFR calc non Af Amer: 17 mL/min — ABNORMAL LOW (ref >90)
Phosphorus: 2.2 mg/dL — ABNORMAL LOW (ref 2.3–4.6)
Sodium: 143 mEq/L (ref 135–145)

## 2012-10-03 MED ORDER — POTASSIUM CHLORIDE CRYS ER 20 MEQ PO TBCR
40.0000 meq | EXTENDED_RELEASE_TABLET | Freq: Two times a day (BID) | ORAL | Status: AC
  Administered 2012-10-03 (×2): 40 meq via ORAL
  Filled 2012-10-03 (×2): qty 2

## 2012-10-03 MED ORDER — FUROSEMIDE 10 MG/ML IJ SOLN
160.0000 mg | Freq: Once | INTRAVENOUS | Status: AC
  Administered 2012-10-03: 160 mg via INTRAVENOUS
  Filled 2012-10-03: qty 16

## 2012-10-03 MED ORDER — POTASSIUM CHLORIDE CRYS ER 20 MEQ PO TBCR
40.0000 meq | EXTENDED_RELEASE_TABLET | Freq: Once | ORAL | Status: AC
  Administered 2012-10-03: 40 meq via ORAL
  Filled 2012-10-03: qty 2

## 2012-10-03 NOTE — Consult Note (Signed)
Pt seen and examined.  Looks like old hematoma but not undrainable fluid collections or need for debridement.  Agree with vascular work up.  If bone involved  Call ortho.

## 2012-10-03 NOTE — Progress Notes (Signed)
ANTICOAGULATION CONSULT NOTE - Follow Up Consult  Pharmacy Consult for Coumadin Indication: atrial fibrillation  No Known Allergies  Patient Measurements: Height: 5\' 11"  (180.3 cm) Weight: 199 lb 9.6 oz (90.538 kg) (a scale) IBW/kg (Calculated) : 75.3  Heparin Dosing Weight:   Vital Signs: Temp: 98.9 F (37.2 C) (12/10 0453) Temp src: Oral (12/10 0453) BP: 131/62 mmHg (12/10 0453) Pulse Rate: 60  (12/10 0453)  Labs:  Basename 10/03/12 0520 10/03/12 0500 10/02/12 0710 10/01/12 0452  HGB -- -- -- --  HCT -- -- -- --  PLT -- -- -- --  APTT -- -- -- --  LABPROT 25.4* -- -- 27.2*  INR 2.44* -- -- 2.68*  HEPARINUNFRC -- -- -- --  CREATININE -- 3.25* 3.41* 3.89*  CKTOTAL -- -- -- --  CKMB -- -- -- --  TROPONINI -- -- -- --    Estimated Creatinine Clearance: 20.9 ml/min (by C-G formula based on Cr of 3.25).  Assessment: 82yom continuing Coumadin for hx Afib. INR (2.44) remains therapeutic on home regimen (3mg  daily). - No CBC this Am - No significant bleeding reported  Goal of Therapy:  INR 2-3   Plan:  1. Continue home Coumadin regimen - 3mg  daily 2. Follow-up AM INR  [name], [name] [phone] 10/03/2012,9:06 AM

## 2012-10-03 NOTE — Progress Notes (Signed)
Subjective:  Patient denies any chest pain states breathing is improved. Right leg also feels better.  Objective:  Vital Signs in the last 24 hours: Temp:  [98.7 F (37.1 C)-98.9 F (37.2 C)] 98.9 F (37.2 C) (12/10 0453) Pulse Rate:  [59-64] 60  (12/10 0453) Resp:  [18-19] 19  (12/10 0453) BP: (96-131)/(40-71) 131/62 mmHg (12/10 0453) SpO2:  [100 %] 100 % (12/10 0453) Weight:  [90.538 kg (199 lb 9.6 oz)] 90.538 kg (199 lb 9.6 oz) (12/10 0453)  Intake/Output from previous day: 12/09 0701 - 12/10 0700 In: 2049.1 [P.O.:810; I.V.:975.1; IV Piggyback:264] Out: 1926 [Urine:1925; Stool:1] Intake/Output from this shift: Total I/O In: 240 [P.O.:240] Out: -   Physical Exam: Neck: no adenopathy, no carotid bruit, no JVD and supple, symmetrical, trachea midline Lungs: Decreased breath sound at bases Heart: regular rate and rhythm, S1, S2 normal and Soft systolic murmur and S3 gallop noted Abdomen: soft, non-tender; bowel sounds normal; no masses,  no organomegaly Extremities: No clubbing cyanosis right leg surgical dressing 2+ edema noted  Lab Results: No results found for this basename: WBC:2,HGB:2,PLT:2 in the last 72 hours  Basename 10/03/12 0500 10/02/12 0710  NA 143 143  K 2.8* 3.0*  CL 106 106  CO2 29 26  GLUCOSE 83 97  BUN 54* 55*  CREATININE 3.25* 3.41*   No results found for this basename: TROPONINI:2,CK,MB:2 in the last 72 hours Hepatic Function Panel  Basename 10/03/12 0500  PROT --  ALBUMIN 2.2*  AST --  ALT --  ALKPHOS --  BILITOT --  BILIDIR --  IBILI --   No results found for this basename: CHOL in the last 72 hours No results found for this basename: PROTIME in the last 72 hours  Imaging: Imaging results have been reviewed and Dg Tibia/fibula Right  10/02/2012  *RADIOLOGY REPORT*  Clinical Data: Pain.  Laceration.  Rule out osteomyelitis  RIGHT TIBIA AND FIBULA - 2 VIEW  Comparison: None.  Findings: Total knee replacement in satisfactory position and  alignment.  No evidence of loosening or infection.  There is heterotopic ossification   superior to the patella.  Negative for fracture.  No evidence of active osteomyelitis in the tibia or fibula.  IMPRESSION: No acute abnormality.   Original Report Authenticated By: [name], M.D.     Cardiac Studies:  Assessment/Plan:  Resolving decompensated systolic heart failure  Coronary artery disease history of anteroseptal wall MI in the past  Ischemic dilated cardiomyopathy status post by V. ICD  Atrial fibrillation  Chronic kidney disease stage IV  Anemia  Hypothyroidism  Hyperalbuminemia  Chronic nonhealing ulcer right leg /hematoma Left arm swelling rule out DVT left arm  Hypokalemia Plan Replace K. Continue rest of the medications check labs in a.m.  LOS: 5 days    [name] 10/03/2012, 9:19 AM

## 2012-10-03 NOTE — Progress Notes (Signed)
  Subjective: No complaints  Objective: Vital signs in last 24 hours: Temp:  [98.7 F (37.1 C)-98.9 F (37.2 C)] 98.9 F (37.2 C) (12/10 0453) Pulse Rate:  [59-64] 60  (12/10 0453) Resp:  [18-19] 19  (12/10 0453) BP: (96-131)/(40-71) 131/62 mmHg (12/10 0453) SpO2:  [100 %] 100 % (12/10 0453) Weight:  [199 lb 9.6 oz (90.538 kg)] 199 lb 9.6 oz (90.538 kg) (12/10 0453) Last BM Date: 10/02/12  Intake/Output from previous day: 12/09 0701 - 12/10 0700 In: 2049.1 [P.O.:810; I.V.:975.1; IV Piggyback:264] Out: 1926 [Urine:1925; Stool:1] Intake/Output this shift: Total I/O In: 240 [P.O.:240] Out: -   Incision/Wound:right lower extremity wound with old hematoma no pus.  Edema noted fluctuant but soft and draining old clot.  Tissue viable.Tibia intact.  Seems well perfused.  Lab Results:  No results found for this basename: WBC:2,HGB:2,HCT:2,PLT:2 in the last 72 hours BMET  [facility] 10/03/12 0500 10/02/12 0710  NA 143 143  K 2.8* 3.0*  CL 106 106  CO2 29 26  GLUCOSE 83 97  BUN 54* 55*  CREATININE 3.25* 3.41*  CALCIUM 8.2* 8.1*   PT/INR  Basename 10/03/12 0520 10/01/12 0452  LABPROT 25.4* 27.2*  INR 2.44* 2.68*   ABG No results found for this basename: PHART:2,PCO2:2,PO2:2,HCO3:2 in the last 72 hours  Studies/Results: Dg Tibia/fibula Right  10/02/2012  *RADIOLOGY REPORT*  Clinical Data: Pain.  Laceration.  Rule out osteomyelitis  RIGHT TIBIA AND FIBULA - 2 VIEW  Comparison: None.  Findings: Total knee replacement in satisfactory position and alignment.  No evidence of loosening or infection.  There is heterotopic ossification   superior to the patella.  Negative for fracture.  No evidence of active osteomyelitis in the tibia or fibula.  IMPRESSION: No acute abnormality.   Original Report Authenticated By: [name], M.D.     Anti-infectives: Anti-infectives     Start     Dose/Rate Route Frequency Ordered Stop   10/04/12 1200   vancomycin (VANCOCIN) IVPB 1000 mg/200 mL  premix        1,000 mg 200 mL/hr over 60 Minutes Intravenous Every 48 hours 10/02/12 1307     10/02/12 1400   vancomycin (VANCOCIN) 1,500 mg in sodium chloride 0.9 % 500 mL IVPB        1,500 mg 250 mL/hr over 120 Minutes Intravenous  Once 10/02/12 1307 10/02/12 1604          Assessment/Plan: RLE hematoma and chronic wound after fall 2 months ago.  No need for debridement or I and D.  Films show no fracture or FB.  Nothing to offer him.  May benefit from vascular surgery input or consider unna boot placement by wound services.  Will sign off.  Keep leg wraped  LOS: 5 days    Jolyssa Oplinger A. 10/03/2012

## 2012-10-03 NOTE — Progress Notes (Signed)
Assessment:  1. Acute of CKD4 Scr improving to 3.25 (Scr 2.8-3.0 in 9/13).  SPEP pending  2. Hx hypertension, but BP low now  3. Vol overload on lasix  4. 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 Mild anemia, iron studies OK  6. Hypokalemia  Plan:  1. cont lasix, need more volume off.  Extra dose today. 2. K replace  Subjective: Interval History: not ambulating, not SOB  Objective: Vital signs in last 24 hours: Temp:  [98.7 F (37.1 C)-98.9 F (37.2 C)] 98.9 F (37.2 C) (12/10 0453) Pulse Rate:  [59-64] 60  (12/10 0955) Resp:  [18-19] 19  (12/10 0453) BP: (96-131)/(40-71) 105/59 mmHg (12/10 0955) SpO2:  [100 %] 100 % (12/10 0453) Weight:  [90.538 kg (199 lb 9.6 oz)] 90.538 kg (199 lb 9.6 oz) (12/10 0453) Weight change: 0.726 kg (1 lb 9.6 oz) No significant Wgt loss  Intake/Output from previous day: 12/09 0701 - 12/10 0700 In: 2049.1 [P.O.:810; I.V.:975.1; IV Piggyback:264] Out: 1926 [Urine:1925; Stool:1] Intake/Output this shift: Total I/O In: 480 [P.O.:480] Out: 825 [Urine:825]  General appearance: alert and cooperative Neck: JVD - 8 cm above sternal notch, supple, symmetrical, trachea midline and thyroid not enlarged, symmetric, no tenderness/mass/nodules Resp: clear to auscultation bilaterally Chest wall: no tenderness Cardio: regular rate and rhythm, S1, S2 normal, no murmur, click, rub or gallop GI: soft, non-tender; bowel sounds normal; no masses,  no organomegaly Extremities: edema 2-3+ bilat Presacral edema 1-2+  Lab Results: No results found for this basename: WBC:2,HGB:2,HCT:2,PLT:2 in the last 72 hours BMET:  Basename 10/03/12 0500 10/02/12 0710  NA 143 143  K 2.8* 3.0*  CL 106 106  CO2 29 26  GLUCOSE 83 97  BUN 54* 55*  CREATININE 3.25* 3.41*  CALCIUM 8.2* 8.1*   No results found for this basename: PTH:2 in the last 72 hours Iron Studies: No results found for this basename: IRON,TIBC,TRANSFERRIN,FERRITIN in the last 72  hours Studies/Results: Dg Tibia/fibula Right  10/02/2012  *RADIOLOGY REPORT*  Clinical Data: Pain.  Laceration.  Rule out osteomyelitis  RIGHT TIBIA AND FIBULA - 2 VIEW  Comparison: None.  Findings: Total knee replacement in satisfactory position and alignment.  No evidence of loosening or infection.  There is heterotopic ossification   superior to the patella.  Negative for fracture.  No evidence of active osteomyelitis in the tibia or fibula.  IMPRESSION: No acute abnormality.   Original Report Authenticated By: [name], M.D.    Scheduled:   . allopurinol  100 mg Oral Daily  . atorvastatin  40 mg Oral Daily  . furosemide  160 mg Intravenous Q12H  . levothyroxine  25 mcg Oral QAC breakfast  . multivitamin with minerals  1 tablet Oral Daily  . potassium chloride  40 mEq Oral BID  . sodium chloride  3 mL Intravenous Q12H  . traZODone  150 mg Oral QHS  . [COMPLETED] vancomycin  1,500 mg Intravenous Once  . vancomycin  1,000 mg Intravenous Q48H  . warfarin  3 mg Oral q1800  . Warfarin - Pharmacist Dosing Inpatient   Does not apply q1800  . [DISCONTINUED] potassium chloride  10 mEq Oral BID     LOS: 5 days   Zahira Brummond C 10/03/2012,1:18 PM

## 2012-10-03 NOTE — Consult Note (Signed)
Wound care follow-up: CCS performed consult and wound assessment earlier. X-ray to right leg did not indicate osteomyelitis or foreign bodies retained in wound bed.  Wound culture results pending.  Pt receiving IV vancomycin and wounds draining less today since opened with swab.  Continue present plan of care with Aquacel to provide antimicrobial benefits while in hospital.  Gave pt free sample of Medihoney to use at home for topical treatment after discharge and instructed on use daily,  This is not available in [facility] formulary but has had evidence-based and case study success according to the outpatient wound care center and journal articles.   Will not plan to follow further unless re-consulted.  [address], RN, MSN, [employer]  [phone]

## 2012-10-04 LAB — CBC
Hemoglobin: 10.9 g/dL — ABNORMAL LOW (ref 13.0–17.0)
MCH: 28.7 pg (ref 26.0–34.0)
MCHC: 32.4 g/dL (ref 30.0–36.0)
Platelets: 137 10*3/uL — ABNORMAL LOW (ref 150–400)
RDW: 18.8 % — ABNORMAL HIGH (ref 11.5–15.5)

## 2012-10-04 LAB — PROTIME-INR: Prothrombin Time: 25.6 seconds — ABNORMAL HIGH (ref 11.6–15.2)

## 2012-10-04 LAB — BASIC METABOLIC PANEL
BUN: 55 mg/dL — ABNORMAL HIGH (ref 6–23)
Calcium: 8.3 mg/dL — ABNORMAL LOW (ref 8.4–10.5)
Creatinine, Ser: 3.01 mg/dL — ABNORMAL HIGH (ref 0.50–1.35)
GFR calc Af Amer: 21 mL/min — ABNORMAL LOW (ref >90)
GFR calc non Af Amer: 18 mL/min — ABNORMAL LOW (ref >90)
Glucose, Bld: 90 mg/dL (ref 70–99)

## 2012-10-04 LAB — MAGNESIUM: Magnesium: 2 mg/dL (ref 1.5–2.5)

## 2012-10-04 MED ORDER — POTASSIUM CHLORIDE CRYS ER 20 MEQ PO TBCR
20.0000 meq | EXTENDED_RELEASE_TABLET | Freq: Two times a day (BID) | ORAL | Status: DC
  Administered 2012-10-05 – 2012-10-06 (×2): 20 meq via ORAL
  Filled 2012-10-04 (×3): qty 1

## 2012-10-04 MED ORDER — FUROSEMIDE 10 MG/ML IJ SOLN: 80.0000 mg | Freq: Once | INTRAMUSCULAR | Status: DC

## 2012-10-04 MED ORDER — FUROSEMIDE 10 MG/ML IJ SOLN
80.0000 mg | Freq: Once | INTRAMUSCULAR | Status: AC
  Administered 2012-10-04: 80 mg via INTRAVENOUS
  Filled 2012-10-04: qty 8

## 2012-10-04 MED ORDER — VANCOMYCIN HCL 1000 MG IV SOLR
750.0000 mg | INTRAVENOUS | Status: DC
  Administered 2012-10-04 – 2012-10-05 (×2): 750 mg via INTRAVENOUS
  Filled 2012-10-04 (×2): qty 750

## 2012-10-04 MED ORDER — POTASSIUM CHLORIDE CRYS ER 20 MEQ PO TBCR
20.0000 meq | EXTENDED_RELEASE_TABLET | Freq: Two times a day (BID) | ORAL | Status: DC
  Administered 2012-10-04: 20 meq via ORAL

## 2012-10-04 MED ORDER — POTASSIUM CHLORIDE CRYS ER 20 MEQ PO TBCR
40.0000 meq | EXTENDED_RELEASE_TABLET | Freq: Two times a day (BID) | ORAL | Status: AC
  Administered 2012-10-04 – 2012-10-05 (×2): 40 meq via ORAL
  Filled 2012-10-04 (×2): qty 2

## 2012-10-04 NOTE — Care Management Note (Unsigned)
    Page 1 of 1   10/10/2012     2:27:19 PM   CARE MANAGEMENT NOTE 10/10/2012  Patient:  [name], [name]   Account Number:  [id number]  Date Initiated:  10/04/2012  Documentation initiated by:  GRAVES-BIGELOW,[place]afen  Subjective/Objective Assessment:   Pt admitted  with Leg edema and shortness of breath. Pt still has iv dobtamine and lasix on baord.     Action/Plan:   PT/OT consults ordered and CM will continue to monitor for disposition needs.   Anticipated DC Date:  10/08/2012   Anticipated DC Plan:        DC Planning Services  CM consult      Choice offered to / List presented to:             Status of service:  In process, will continue to follow Medicare Important Message given?   (If response is "NO", the following Medicare IM given date fields will be blank) Date Medicare IM given:   Date Additional Medicare IM given:    Discharge Disposition:    Per UR Regulation:  Reviewed for med. necessity/level of care/duration of stay  If discussed at Long Length of Stay Meetings, dates discussed:   10/10/2012    Comments:  10-10-12 [address], [place] [phone] CIR is following pt to see if is apropriate at d/c. Plan for possible HD Wednesday. CM did call HD to speak to Darel Hong and she was not available and will be back on Wednesday. Cm wanted to discuss to see if pt had been clipped for outpatient HD. CM will continue to monitor.

## 2012-10-04 NOTE — Progress Notes (Signed)
ANTIBIOTIC AND ANTICOAGULATION CONSULT NOTE - FOLLOW UP  Pharmacy Consult for Vancomycin and Coumadin Indication: Leg ulcer; Afib  No Known Allergies  Patient Measurements: Height: 5\' 11"  (180.3 cm) Weight: 200 lb 6.4 oz (90.9 kg) IBW/kg (Calculated) : 75.3  Adjusted Body Weight:   Vital Signs: Temp: 98.2 F (36.8 C) (12/11 0543) Temp src: Oral (12/11 0543) BP: 128/62 mmHg (12/11 0543) Pulse Rate: 62  (12/11 0543) Intake/Output from previous day: 12/10 0701 - 12/11 0700 In: 1324 [P.O.:1258; IV Piggyback:66] Out: 3175 [Urine:3175] Intake/Output from this shift:    Labs:  Basename 10/04/12 0555 10/03/12 0500 10/02/12 0710  WBC 5.7 -- --  HGB 10.9* -- --  PLT 137* -- --  LABCREA -- -- --  CREATININE 3.01* 3.25* 3.41*   Estimated Creatinine Clearance: 22.6 ml/min (by C-G formula based on Cr of 3.01). No results found for this basename: VANCOTROUGH:2,VANCOPEAK:2,VANCORANDOM:2,GENTTROUGH:2,GENTPEAK:2,GENTRANDOM:2,TOBRATROUGH:2,TOBRAPEAK:2,TOBRARND:2,AMIKACINPEAK:2,AMIKACINTROU:2,AMIKACIN:2, in the last 72 hours   Microbiology: Recent Results (from the past 720 hour(s))  WOUND CULTURE     Status: Normal (Preliminary result)   Collection Time   10/02/12  6:59 PM      Component Value Range Status Comment   Specimen Description WOUND LEG   Final    Special Requests NONE   Final    Gram Stain     Final    Value: FEW WBC PRESENT, PREDOMINANTLY PMN     MODERATE SQUAMOUS EPITHELIAL CELLS PRESENT     RARE GRAM POSITIVE COCCI IN PAIRS   Culture     Final    Value: FEW STAPHYLOCOCCUS AUREUS     Note: RIFAMPIN AND GENTAMICIN SHOULD NOT BE USED AS SINGLE DRUGS FOR TREATMENT OF STAPH INFECTIONS.     FEW GRAM NEGATIVE RODS   Report Status PENDING   Incomplete     Anti-infectives     Start     Dose/Rate Route Frequency Ordered Stop   10/04/12 1200   vancomycin (VANCOCIN) IVPB 1000 mg/200 mL premix        1,000 mg 200 mL/hr over 60 Minutes Intravenous Every 48 hours 10/02/12  1307     10/02/12 1400   vancomycin (VANCOCIN) 1,500 mg in sodium chloride 0.9 % 500 mL IVPB        1,500 mg 250 mL/hr over 120 Minutes Intravenous  Once 10/02/12 1307 10/02/12 1604          Assessment: 1. 82yom on Vancomycin Day 3 for non-healing leg ulcer. Patient remains afebrile and WBC wnl. See cultures above. Leg imaging did not show any acute abnormalities. Patient's renal function has improved (SCr 4.06-->3.01) - will adjust Vancomycin regimen and plan to check trough in 2-3 days if continued.  2. Patient is also on Coumadin for hx Afib. INR (2.47) remains therapeutic on home regimen (3mg  daily) - will continue and change INR monitoring to M/W/F to reduce lab draws. - H/H and Plts trending down - No significant bleeding reported  Goal of Therapy:  Vancomycin trough level 10-15 mcg/ml INR 2-3  Plan:  1. Change Vancomycin to 750mg  IV q24h - next dose this AM 2. Continue Coumadin 3mg  po daily 3. Check INR Mon, Lucia Bitter, Fri  [place], [place] [phone] 10/04/2012,9:04 AM

## 2012-10-04 NOTE — Progress Notes (Signed)
Assessment:  1. Acute of CKD4 Scr improving to 3.25 (Scr 2.8-3.0 in 9/13). SPEP pending  2. Hx hypertension,  3. Vol overload on lasix, improving 4. 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 Hypokalemia  Plan:  1. cont lasix IV 2. K replace 3. Mobilize when appropriate  Subjective: Interval History: No complaints  Objective: Vital signs in last 24 hours: Temp:  [98.2 F (36.8 C)-98.3 F (36.8 C)] 98.2 F (36.8 C) (12/11 0543) Pulse Rate:  [59-63] 59  (12/11 0929) Resp:  [18] 18  (12/11 0543) BP: (117-129)/(57-74) 129/69 mmHg (12/11 0929) SpO2:  [95 %-96 %] 96 % (12/11 0543) Weight:  [90.9 kg (200 lb 6.4 oz)] 90.9 kg (200 lb 6.4 oz) (12/11 0543) Weight change: 0.362 kg (12.8 oz)  Intake/Output from previous day: 12/10 0701 - 12/11 0700 In: 1324 [P.O.:1258; IV Piggyback:66] Out: 3175 [Urine:3175] Intake/Output this shift: Total I/O In: 3 [I.V.:3] Out: -   General appearance: alert and cooperative, appears weak Resp: clear to auscultation bilaterally anteriorly Chest wall: no tenderness Cardio: regular rate and rhythm, S1, S2 normal, no murmur, click, rub or gallop Extremities: edema improving 1+, rle with ulcer bandaged  Lab Results:  [facility] 10/04/12 0555  WBC 5.7  HGB 10.9*  HCT 33.6*  PLT 137*   BMET:  Basename 10/04/12 0555 10/03/12 0500  NA 145 143  K 3.4* 2.8*  CL 108 106  CO2 27 29  GLUCOSE 90 83  BUN 55* 54*  CREATININE 3.01* 3.25*  CALCIUM 8.3* 8.2*   No results found for this basename: PTH:2 in the last 72 hours Iron Studies: No results found for this basename: IRON,TIBC,TRANSFERRIN,FERRITIN in the last 72 hours Studies/Results: Dg Tibia/fibula Right  10/02/2012  *RADIOLOGY REPORT*  Clinical Data: Pain.  Laceration.  Rule out osteomyelitis  RIGHT TIBIA AND FIBULA - 2 VIEW  Comparison: None.  Findings: Total knee replacement in satisfactory position and alignment.  No evidence of loosening or infection.  There is heterotopic ossification    superior to the patella.  Negative for fracture.  No evidence of active osteomyelitis in the tibia or fibula.  IMPRESSION: No acute abnormality.   Original Report Authenticated By: [name], M.D.    Scheduled:   . allopurinol  100 mg Oral Daily  . atorvastatin  40 mg Oral Daily  . furosemide  160 mg Intravenous Q12H  . [COMPLETED] furosemide  160 mg Intravenous Once  . levothyroxine  25 mcg Oral QAC breakfast  . multivitamin with minerals  1 tablet Oral Daily  . [COMPLETED] potassium chloride  40 mEq Oral BID  . [COMPLETED] potassium chloride  40 mEq Oral Once  . sodium chloride  3 mL Intravenous Q12H  . traZODone  150 mg Oral QHS  . vancomycin  750 mg Intravenous Q24H  . warfarin  3 mg Oral q1800  . Warfarin - Pharmacist Dosing Inpatient   Does not apply q1800  . [DISCONTINUED] vancomycin  1,000 mg Intravenous Q48H    LOS: 6 days   Roger Fasnacht C 10/04/2012,11:24 AM

## 2012-10-04 NOTE — Progress Notes (Signed)
Subjective:  Patient denies any chest pain states breathing is improved and leg swelling also improved  Objective:  Vital Signs in the last 24 hours: Temp:  [98.2 F (36.8 C)-98.3 F (36.8 C)] 98.2 F (36.8 C) (12/11 0543) Pulse Rate:  [59-63] 59  (12/11 0929) Resp:  [18] 18  (12/11 0543) BP: (117-129)/(57-74) 129/69 mmHg (12/11 0929) SpO2:  [95 %-96 %] 96 % (12/11 0543) Weight:  [90.9 kg (200 lb 6.4 oz)] 90.9 kg (200 lb 6.4 oz) (12/11 0543)  Intake/Output from previous day: 12/10 0701 - 12/11 0700 In: 1324 [P.O.:1258; IV Piggyback:66] Out: 3175 [Urine:3175] Intake/Output from this shift: Total I/O In: 3 [I.V.:3] Out: 750 [Urine:750]  Physical Exam: Neck: no adenopathy, no carotid bruit, no JVD and supple, symmetrical, trachea midline Lungs: Decrease vessel at bases Heart: regular rate and rhythm, S1, S2 normal and Soft systolic murmur and S3 gallop noted Abdomen: soft, non-tender; bowel sounds normal; no masses,  no organomegaly Extremities: No clubbing cyanosis 1+ edema noted right leg surgical dressing noted  Lab Results:  [facility] 10/04/12 0555  WBC 5.7  HGB 10.9*  PLT 137*    Basename 10/04/12 0555 10/03/12 0500  NA 145 143  K 3.4* 2.8*  CL 108 106  CO2 27 29  GLUCOSE 90 83  BUN 55* 54*  CREATININE 3.01* 3.25*   No results found for this basename: TROPONINI:2,CK,MB:2 in the last 72 hours Hepatic Function Panel  Basename 10/03/12 0500  PROT --  ALBUMIN 2.2*  AST --  ALT --  ALKPHOS --  BILITOT --  BILIDIR --  IBILI --   No results found for this basename: CHOL in the last 72 hours No results found for this basename: PROTIME in the last 72 hours  Imaging: Imaging results have been reviewed and Dg Tibia/fibula Right  10/02/2012  *RADIOLOGY REPORT*  Clinical Data: Pain.  Laceration.  Rule out osteomyelitis  RIGHT TIBIA AND FIBULA - 2 VIEW  Comparison: None.  Findings: Total knee replacement in satisfactory position and alignment.  No evidence of  loosening or infection.  There is heterotopic ossification   superior to the patella.  Negative for fracture.  No evidence of active osteomyelitis in the tibia or fibula.  IMPRESSION: No acute abnormality.   Original Report Authenticated By: [name], M.D.     Cardiac Studies:  Assessment/Plan:  Resolving decompensated systolic heart failure  Coronary artery disease history of anteroseptal wall MI in the past  Ischemic dilated cardiomyopathy status post by V. ICD  Atrial fibrillation  Chronic kidney disease stage IV  Anemia  Hypothyroidism  Hyperalbuminemia  Chronic nonhealing ulcer right leg /hematoma  Left arm swelling rule out DVT left arm  Hypokalemia Plan Continue present management Check labs in a.m. OT PT consult Ambulate with assistance  LOS: 6 days    Carly Applegate N 10/04/2012, 12:29 PM

## 2012-10-05 ENCOUNTER — Encounter (HOSPITAL_COMMUNITY): Payer: Self-pay | Admitting: Physical Medicine and Rehabilitation

## 2012-10-05 DIAGNOSIS — R5381 Other malaise: Secondary | ICD-10-CM

## 2012-10-05 LAB — WOUND CULTURE

## 2012-10-05 LAB — CBC
HCT: 30.9 % — ABNORMAL LOW (ref 39.0–52.0)
MCHC: 33 g/dL (ref 30.0–36.0)
Platelets: 147 10*3/uL — ABNORMAL LOW (ref 150–400)
RDW: 18.5 % — ABNORMAL HIGH (ref 11.5–15.5)
WBC: 6.5 10*3/uL (ref 4.0–10.5)

## 2012-10-05 LAB — BASIC METABOLIC PANEL
BUN: 56 mg/dL — ABNORMAL HIGH (ref 6–23)
GFR calc Af Amer: 23 mL/min — ABNORMAL LOW (ref >90)
GFR calc non Af Amer: 19 mL/min — ABNORMAL LOW (ref >90)
Potassium: 3.3 mEq/L — ABNORMAL LOW (ref 3.5–5.1)

## 2012-10-05 LAB — PRO B NATRIURETIC PEPTIDE: Pro B Natriuretic peptide (BNP): 28747 pg/mL — ABNORMAL HIGH (ref 0–450)

## 2012-10-05 MED ORDER — DEXTROSE 5 % IV SOLN
1.0000 g | INTRAVENOUS | Status: DC
  Administered 2012-10-05 – 2012-10-10 (×6): 1 g via INTRAVENOUS
  Filled 2012-10-05 (×7): qty 1

## 2012-10-05 MED ORDER — ISOSORB DINITRATE-HYDRALAZINE 20-37.5 MG PO TABS
1.0000 | ORAL_TABLET | Freq: Two times a day (BID) | ORAL | Status: DC
  Administered 2012-10-05 – 2012-10-07 (×5): 1 via ORAL
  Filled 2012-10-05 (×6): qty 1

## 2012-10-05 NOTE — Progress Notes (Deleted)
Pt noted to be mostly sleeping and not eating or drinking much.  Dr. Mahala Menghini aware and orders for foley placed and to collect some labs .  Offered bedpan and pt attempted to void on bedside commode unable to .  On coming nurse aware.   Will bladder scan and insert foley.  Will continue to monitor. Amanda Pea, Charity fundraiser.

## 2012-10-05 NOTE — Progress Notes (Signed)
Subjective:  Patient denies any chest pain or shortness of breath states walked to the bathroom yesterday. Still has significant leg swelling. Albumin remains low  Objective:  Vital Signs in the last 24 hours: Temp:  [98 F (36.7 C)-98.3 F (36.8 C)] 98 F (36.7 C) (12/12 0520) Pulse Rate:  [52-60] 60  (12/12 0520) Resp:  [18] 18  (12/12 0520) BP: (111-129)/(51-69) 118/64 mmHg (12/12 0520) SpO2:  [95 %-99 %] 95 % (12/12 0520) Weight:  [90.6 kg (199 lb 11.8 oz)] 90.6 kg (199 lb 11.8 oz) (12/12 0520)  Intake/Output from previous day: 12/11 0701 - 12/12 0700 In: 765 [P.O.:480; I.V.:3; IV Piggyback:282] Out: 2126 [Urine:2125; Stool:1] Intake/Output from this shift:    Physical Exam: Neck: no adenopathy, no carotid bruit, no JVD and supple, symmetrical, trachea midline Lungs: Decreased breath sound at bases Heart: regular rate and rhythm, S1, S2 normal and Soft systolic murmur noted Abdomen: soft, non-tender; bowel sounds normal; no masses,  no organomegaly Extremities: No clubbing cyanosis 2+ edema surgical dressing right leg  Lab Results:  Basename 10/05/12 0526 10/04/12 0555  WBC 6.5 5.7  HGB 10.2* 10.9*  PLT 147* 137*    Basename 10/05/12 0526 10/04/12 0555  NA 143 145  K 3.3* 3.4*  CL 104 108  CO2 30 27  GLUCOSE 112* 90  BUN 56* 55*  CREATININE 2.85* 3.01*   No results found for this basename: TROPONINI:2,CK,MB:2 in the last 72 hours Hepatic Function Panel  Basename 10/03/12 0500  PROT --  ALBUMIN 2.2*  AST --  ALT --  ALKPHOS --  BILITOT --  BILIDIR --  IBILI --   No results found for this basename: CHOL in the last 72 hours No results found for this basename: PROTIME in the last 72 hours  Imaging: Imaging results have been reviewed and No results found.  Cardiac Studies:  Assessment/Plan:  Resolving decompensated systolic heart failure  Coronary artery disease history of anteroseptal wall MI in the past  Ischemic dilated cardiomyopathy status  post by V. ICD  Atrial fibrillation  Chronic kidney disease stage IV  Anemia  Hypothyroidism  Hyperalbuminemia  Chronic nonhealing ulcer right leg /hematoma  Hypokalemia Plan Start BIDIL as per orders We'll wean off dobutamine from tomorrow Consider albumin as per renal service Increase ambulation TED stockings  LOS: 7 days    [name] 10/05/2012, 8:36 AM

## 2012-10-05 NOTE — Progress Notes (Signed)
Rehab Admissions Coordinator Note:  Patient was screened by Clois Dupes for appropriateness for an Inpatient Acute Rehab Consult.  Noted PT and OT recommendations for CIR. At this time, we are recommending Inpatient Rehab consult and I contacted Dr. Sharyn Lull for the order.  Clois Dupes, RN 10/05/2012, 1:55 PM  I can be reached at [phone].

## 2012-10-05 NOTE — Consult Note (Signed)
Physical Medicine and Rehabilitation Consult Reason for Consult: Deconditioning. Referring Physician:  Dr. Sharyn Lull.  Chief complaint: Left ankle pain  HPI: [name] is a 78 y.o. male with CAD, atrial fibrillation,  CKD, admitted on 09/28/12 with progressive LE edema and SOB. He was treated with diuretics for acute on chronic CHF. Has history of fall with non-healing LE wounds with ?osteomyelitis. CCS consulted and felt that wounds likely due old hematoma and vascular work up recommended to check of PVD and antibiotic therapy with assumption of MRSA. Nephrology consulted for input on worsening renal function. Renal U/S consistent with medical renal disease. Acute on chronic renal failure improving and no need for AV access at this time. PT/OT evaluations done today and recommending CIR level therapies. Patient has history of gout which usually affects the left ankle. Patient states she was walking much better before his ankle started hurting Review of Systems  HENT: Negative for neck pain.   Eyes: Negative for blurred vision and double vision.  Respiratory: Positive for shortness of breath.   Cardiovascular: Positive for leg swelling. Negative for chest pain and palpitations.  Gastrointestinal: Negative for heartburn and vomiting.  Musculoskeletal: Positive for joint pain (left ankle pain in the last 24 hours. ). Negative for back pain.  Skin:       Wounds RLE  Neurological: Positive for weakness (no appetite for 2-3 weeks PTA. ). Negative for dizziness and headaches.   Past Medical History  Diagnosis Date  . Dyslipidemia   . CHF (congestive heart failure), NYHA class II   . Atrial fibrillation   . Prostate cancer   . Coronary artery disease   . Myocardial infarction   . Hypertension   . Depression   . Shortness of breath   . Chronic kidney disease     renal insufficiency  . ICD (implantable cardiac defibrillator) in place    Past Surgical History  Procedure Date  .  Medtronic biv icd 10/2006  . Pci/stent 02/2005  . Total knee arthroplasty    Family History  Problem Relation Age of Onset  . Diabetes Mother    Social History: Married. 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  Independent PTA with quad cane. He  reports that he quit smoking about 43 years ago. He has never used smokeless tobacco. He reports that he does not drink alcohol or use illicit drugs.  Allergies: No Known Allergies  Medications Prior to Admission  Medication Sig Dispense Refill  . allopurinol (ZYLOPRIM) 100 MG tablet Take 100 mg by mouth daily.       Marland Kitchen amiodarone (PACERONE) 200 MG tablet Take 400 mg by mouth daily.       Marland Kitchen atorvastatin (LIPITOR) 40 MG tablet Take 40 mg by mouth daily.        . carvedilol (COREG CR) 40 MG 24 hr capsule Take 40 mg by mouth daily.        . DULoxetine (CYMBALTA) 60 MG capsule Take 60 mg by mouth daily.        Marland Kitchen eplerenone (INSPRA) 25 MG tablet Take 25 mg by mouth 2 (two) times daily.        . furosemide (LASIX) 80 MG tablet Take 80 mg by mouth 3 (three) times daily.       . Multiple Vitamin (MULTIVITAMINS PO) Take 1 tablet by mouth daily.       . potassium chloride SA (K-DUR,KLOR-CON) 20 MEQ tablet Take 20  mEq by mouth 2 (two) times daily.       . ramipril (ALTACE) 10 MG capsule Take 10 mg by mouth daily.       . traZODone (DESYREL) 150 MG tablet Take 150 mg by mouth at bedtime.      Marland Kitchen warfarin (COUMADIN) 3 MG tablet Take 3 mg by mouth daily.        Home: Home Living Lives With: Spouse Available Help at Discharge: Family;Available 24 hours/day (supervision only) Type of Home: House Home Access: Stairs to enter [employer] of Steps: 7 Entrance Stairs-Rails: Right (house on the other side of the steps) Home Layout: Two level;Laundry or work area in Artist of Steps: does not need to go downstairs Bathroom Shower/Tub: Health visitor:  Engineer, mining: Paediatric nurse with back;Walker - rolling;Grab bars in shower;Bedside commode/3-in-1 (raised toilet seat without rails)  Functional History: Prior Function Able to Take Stairs?: Yes Driving: No Vocation: Retired Functional Status:  Mobility: Bed Mobility Bed Mobility: Not assessed Transfers Transfers: Sit to Stand;Stand to Sit Sit to Stand: 3: Mod assist;4: Min assist;With armrests;From chair/3-in-1;From bed;With upper extremity assist Stand to Sit: 4: Min assist;With armrests;To chair/3-in-1;To bed;With upper extremity assist Ambulation/Gait Ambulation/Gait Assistance: 3: Mod assist;4: Min assist Ambulation Distance (Feet): 40 Feet Assistive device: Rolling walker Ambulation/Gait Assistance Details: facilitation for tall standing and sequencing cues throughout for safety negotiating RW specifically with turns pt pushing RW too far ahead with poor spacing and feet outside of RW to the left putting himself at high risk of fall; more difficulty negotiating in tight spaces Gait Pattern: Step-to pattern;Trunk flexed;Antalgic Gait velocity: slowed General Gait Details: left ankle pain with increased swelling, pt reports history of gout Stairs: No    ADL: ADL Grooming: Performed;Wash/dry hands;Min guard Where Assessed - Grooming: Unsupported standing Upper Body Dressing: Performed;Minimal assistance Where Assessed - Upper Body Dressing: Unsupported sitting Lower Body Dressing: Performed;+1 Total assistance Where Assessed - Lower Body Dressing: Unsupported sitting Toilet Transfer: Performed;Moderate assistance Toilet Transfer Method: Sit to stand Toilet Transfer Equipment: Raised toilet seat with arms (or 3-in-1 over toilet) Equipment Used: Gait belt;Rolling walker Transfers/Ambulation Related to ADLs: Mod assist with RW for ambulation in room and hallway.  Pt requires assist and max verbal cueing to safely manuever RW.   ADL Comments: Pt demonstrating  unsafe use of RW throughout session and required constant cueing for technique.  Cognition: Cognition Arousal/Alertness: Awake/alert Orientation Level: Oriented X4 Cognition Overall Cognitive Status: Impaired Area of Impairment: Problem solving;Memory Arousal/Alertness: Awake/alert Orientation Level: Appears intact for tasks assessed Behavior During Session: [facility] for tasks performed Memory Deficits: some difficulty remembering safe sequencing with RW x2 regardless of cues during session Problem Solving: decreased safe problem solving skills using RW in more dynamically challenging and tight spaces Cognition - Other Comments: very pleasant  Blood pressure 109/60, pulse 59, temperature 98.5 F (36.9 C), temperature source Oral, resp. rate 18, height 5\' 11"  (1.803 m), weight 90.6 kg (199 lb 11.8 oz), SpO2 100.00%. Physical Exam  Nursing note and vitals reviewed. Constitutional: He is oriented to person, place, and time. He appears well-developed and well-nourished.  HENT:  Head: Normocephalic and atraumatic.  Eyes: Pupils are equal, round, and reactive to light.  Neck: Normal range of motion.  Cardiovascular: Normal rate and regular rhythm.   Murmur heard. Pulmonary/Chest: Effort normal. He has decreased breath sounds.  Abdominal: Soft. Bowel sounds are normal.  Musculoskeletal: He exhibits edema (left ankle 2+edema with pain.).  Left ankle hypersensitivity.   Neurological: He is alert and oriented to person, place, and time.  Skin: Skin is warm and dry.  Left ankle has tenderness to palpation at the lateral aspect. He has pain with active range of motion. No pain in the MTP joints no pain in the knee. No pain on the right side. Upper extremity strength Is 4/5 in the deltoid bicep tricep grip Right lower extremity 4 minus in the hip flexor knee extensor ankle dose flexors plantar flexor Left lower extremity 4 minus in hip flexor knee extensor trace and ankle dorsiflexor plantar  flexor Secondary to pain  Results for orders placed during the hospital encounter of 09/28/12 (from the past 24 hour(s))  CBC     Status: Abnormal   Collection Time   10/05/12  5:26 AM      Component Value Range   WBC 6.5  4.0 - 10.5 K/uL   RBC 3.52 (*) 4.22 - 5.81 MIL/uL   Hemoglobin 10.2 (*) 13.0 - 17.0 g/dL   HCT 40.9 (*) 81.1 - 91.4 %   MCV 87.8  78.0 - 100.0 fL   MCH 29.0  26.0 - 34.0 pg   MCHC 33.0  30.0 - 36.0 g/dL   RDW 78.2 (*) 95.6 - 21.3 %   Platelets 147 (*) 150 - 400 K/uL  BASIC METABOLIC PANEL     Status: Abnormal   Collection Time   10/05/12  5:26 AM      Component Value Range   Sodium 143  135 - 145 mEq/L   Potassium 3.3 (*) 3.5 - 5.1 mEq/L   Chloride 104  96 - 112 mEq/L   CO2 30  19 - 32 mEq/L   Glucose, Bld 112 (*) 70 - 99 mg/dL   BUN 56 (*) 6 - 23 mg/dL   Creatinine, Ser 0.86 (*) 0.50 - 1.35 mg/dL   Calcium 8.3 (*) 8.4 - 10.5 mg/dL   GFR calc non Af Amer 19 (*) >90 mL/min   GFR calc Af Amer 23 (*) >90 mL/min  PRO B NATRIURETIC PEPTIDE     Status: Abnormal   Collection Time   10/05/12  5:26 AM      Component Value Range   Pro B Natriuretic peptide (BNP) 28747.0 (*) 0 - 450 pg/mL   No results found.  Assessment/Plan: Diagnosis: Deconditioning with gout left ankle 1. Does the need for close, 24 hr/day medical supervision in concert with the patient's rehab needs make it unreasonable for this patient to be served in a less intensive setting? No 2. Co-Morbidities requiring supervision/potential complications: Acute on chronic renal failure, chronic systolic heart failure 3. Due to bowel management, safety, skin/wound care, disease management, medication administration and patient education, does the patient require 24 hr/day rehab nursing? Potentially 4. Does the patient require coordinated care of a physician, rehab nurse, PT (1-2 hrs/day, 5 days/week) and OT (1-2 hrs/day, 5 days/week) to address physical and functional deficits in the context of the above  medical diagnosis(es)? Potentially Addressing deficits in the following areas: balance, endurance, locomotion, strength, transferring and toileting 5. Can the patient actively participate in an intensive therapy program of at least 3 hrs of therapy per day at least 5 days per week? Potentially 6. The potential for patient to make measurable gains while on inpatient rehab is good 7. Anticipated functional outcomes upon discharge from inpatient rehab are Supervision mobility with PT, Supervision ADLs with OT, Not applicable with SLP. 8. Estimated rehab length of stay to  reach the above functional goals is: 7-10 days 9. Does the patient have adequate social supports to accommodate these discharge functional goals? Potentially 10. Anticipated D/C setting: Home 11. Anticipated post D/C treatments: HH therapy 12. Overall Rehab/Functional Prognosis: good  RECOMMENDATIONS: This patient's condition is appropriate for continued rehabilitative care in the following setting: I would first recommend treating gout flare with the Medrol Dosepak. This may dramatically improve his mobility. If his functional level persists at min to mod assist after the Medrol Dosepak, would recommend CIR Patient has agreed to participate in recommended program. Potentially Note that insurance prior authorization may be required for reimbursement for recommended care.  Comment:    10/05/2012

## 2012-10-05 NOTE — Evaluation (Signed)
Physical Therapy Evaluation Patient Details Name: [name] MRN: [id number] DOB: 03-19-1930 Today's Date: 10/05/2012 Time: 7829-5621 PT Time Calculation (min): 41 min  PT Assessment / Plan / Recommendation Clinical Impression  [name] is 78 y/o male who recently fell at home, admitted to [facility] for SOB and RLE edema. Presents to PT today with generalized weakness and impaired balance affecting his safety with mobility and gait. Will benefit physical therapy in the acute setting to address the below impairments so as to maximize functional independence and facilitate safest d/c plan.     PT Assessment  Patient needs continued PT services    Follow Up Recommendations  CIR    Does the patient have the potential to tolerate intense rehabilitation      Barriers to Discharge Decreased caregiver support wife only can provide supervision    Equipment Recommendations  None recommended by PT    Recommendations for Other Services OT consult   Frequency Min 3X/week    Precautions / Restrictions Precautions Precautions: Fall Restrictions Weight Bearing Restrictions: No   Pertinent Vitals/Pain C/o increase swelling and pain of left ankle 7/10; does report history of gout      Mobility  Bed Mobility Bed Mobility: Not assessed Details for Bed Mobility Assistance: pt sitting EOB Transfers Transfers: Sit to Stand;Stand to Sit Sit to Stand: 3: Mod assist;4: Min assist;With armrests;From chair/3-in-1;From bed;With upper extremity assist Stand to Sit: 4: Min assist;With armrests;To chair/3-in-1;To bed;With upper extremity assist Details for Transfer Assistance: from lower surface pt stood needing min-mod facilitation to achieve and maintain anterior translation during power up for tall standing, safety cues throughout; easier from higher surface and arm rests needing just minA Ambulation/Gait Ambulation/Gait Assistance: 3: Mod assist;4: Min assist Ambulation Distance (Feet): 40  Feet Assistive device: Rolling walker Ambulation/Gait Assistance Details: facilitation for tall standing and sequencing cues throughout for safety negotiating RW specifically with turns pt pushing RW too far ahead with poor spacing and feet outside of RW to the left putting himself at high risk of fall; more difficulty negotiating in tight spaces Gait Pattern: Step-to pattern;Trunk flexed;Antalgic Gait velocity: slowed General Gait Details: left ankle pain with increased swelling, pt reports history of gout Stairs: No              PT Diagnosis: Difficulty walking;Abnormality of gait;Generalized weakness;Acute pain  PT Problem List: Decreased strength;Decreased activity tolerance;Decreased balance;Decreased mobility;Decreased cognition;Decreased knowledge of use of DME;Decreased safety awareness;Pain;Cardiopulmonary status limiting activity;Decreased skin integrity PT Treatment Interventions: DME instruction;Gait training;Functional mobility training;Therapeutic activities;Therapeutic exercise;Balance training;Neuromuscular re-education;Cognitive remediation;Patient/family education   PT Goals Acute Rehab PT Goals PT Goal Formulation: With patient Time For Goal Achievement: 10/12/12 Potential to Achieve Goals: Good Pt will go Supine/Side to Sit: with modified independence Pt will go Sit to Supine/Side: with modified independence PT Goal: Sit to Supine/Side - Progress: Goal set today Pt will go Sit to Stand: with modified independence PT Goal: Sit to Stand - Progress: Goal set today Pt will go Stand to Sit: with modified independence PT Goal: Stand to Sit - Progress: Goal set today Pt will Transfer Bed to Chair/Chair to Bed: with modified independence PT Transfer Goal: Bed to Chair/Chair to Bed - Progress: Goal set today Pt will Stand: with modified independence PT Goal: Stand - Progress: Goal set today Pt will Ambulate: 51 - 150 feet;with modified independence;with least restrictive  assistive device PT Goal: Ambulate - Progress: Goal set today Pt will Perform Home Exercise Program: Independently PT Goal: Perform Home Exercise Program -  Progress: Goal set today  Visit Information  Last PT Received On: 10/05/12 Assistance Needed: +1    Subjective Data  Subjective: I fell down the steps because of my bulky new shoes.  Patient Stated Goal: safety prior to home   Prior Functioning  Home Living Lives With: Spouse Available Help at Discharge: Family;Available 24 hours/day (supervision only) Type of Home: House Home Access: Stairs to enter [employer] of Steps: 7 Entrance Stairs-Rails: Right (house on the other side of the steps) Home Layout: Two level;Laundry or work area in Artist of Steps: does not need to go downstairs Bathroom Shower/Tub: Health visitor: Engineer, mining: Paediatric nurse with back;Walker - rolling;Grab bars in shower;Bedside commode/3-in-1 (raised toilet seat without rails) Prior Function Level of Independence: Independent with assistive device(s) (has been getting PTat home for balance prior to fall) Able to Take Stairs?: Yes Driving: No Vocation: Retired Musician: No difficulties    Cognition  Overall Cognitive Status: Impaired Area of Impairment: Problem solving;Memory Arousal/Alertness: Awake/alert Orientation Level: Appears intact for tasks assessed Behavior During Session: [facility] for tasks performed Memory Deficits: some difficulty remembering safe sequencing with RW x2 regardless of cues during session Problem Solving: decreased safe problem solving skills using RW in more dynamically challenging and tight spaces Cognition - Other Comments: very pleasant    Extremity/Trunk Assessment Right Upper Extremity Assessment RUE ROM/Strength/Tone: WFL for tasks assessed Left Upper Extremity Assessment LUE ROM/Strength/Tone: WFL for tasks  assessed Right Lower Extremity Assessment RLE ROM/Strength/Tone: Deficits RLE ROM/Strength/Tone Deficits: grossly 4/5, generally weak needing upper extremities and therapist to assist with standing Left Lower Extremity Assessment LLE ROM/Strength/Tone: Deficits LLE ROM/Strength/Tone Deficits: grossly 4/5, generally weak needing upper extremities and therapist to assist with standing   Balance Balance Balance Assessed: Yes Static Standing Balance Static Standing - Balance Support: Left upper extremity supported Static Standing - Level of Assistance: 1: +2 Total assist (60-70%) Static Standing - Comment/# of Minutes: pt with moderate posterior lean in static standing needing bilateral facilitation to prevent posterior LOB (LUE HHA), assist to bring trunk anterior (pt with gaurded frame grabbing for upper extremity support)  End of Session PT - End of Session Equipment Utilized During Treatment: Gait belt Activity Tolerance: Patient tolerated treatment well;Patient limited by fatigue Patient left: in chair;with call bell/phone within reach;with family/visitor present Nurse Communication: Mobility status  GP     [facility] HELEN 10/05/2012, 1:30 PM

## 2012-10-05 NOTE — Evaluation (Signed)
Occupational Therapy Evaluation Patient Details Name: [name] MRN: [id number] DOB: 1930/10/09 Today's Date: 10/05/2012 Time: 4098-1191 OT Time Calculation (min): 41 min  OT Assessment / Plan / Recommendation Clinical Impression  Pt admitted with progressive LE edema and SOB.  Reports experiencing falls at home.  Will benefit from acute OT services to address below problem list.  Recommending CIR further maximize safety and independence with ADLs in prep for eventual d/c home.    OT Assessment  Patient needs continued OT Services    Follow Up Recommendations  CIR    Barriers to Discharge      Equipment Recommendations   (tbd by OT)    Recommendations for Other Services Rehab consult  Frequency  Min 2X/week    Precautions / Restrictions Precautions Precautions: Fall Restrictions Weight Bearing Restrictions: No   Pertinent Vitals/Pain See vitals    ADL  Grooming: Performed;Wash/dry hands;Min guard Where Assessed - Grooming: Unsupported standing Upper Body Dressing: Performed;Minimal assistance Where Assessed - Upper Body Dressing: Unsupported sitting Lower Body Dressing: Performed;+1 Total assistance Where Assessed - Lower Body Dressing: Unsupported sitting Toilet Transfer: Performed;Moderate assistance Toilet Transfer Method: Sit to stand Toilet Transfer Equipment: Raised toilet seat with arms (or 3-in-1 over toilet) Toileting - Clothing Manipulation and Hygiene: Performed;Minimal assistance Where Assessed - Toileting Clothing Manipulation and Hygiene: Sit to stand from 3-in-1 or toilet Equipment Used: Gait belt;Rolling walker Transfers/Ambulation Related to ADLs: Mod assist with RW for ambulation in room and hallway.  Pt requires assist and max verbal cueing to safely manuever RW.   ADL Comments: Pt demonstrating unsafe use of RW throughout session and required constant cueing for technique.    OT Diagnosis: Generalized weakness;Acute pain  OT Problem List:  Decreased strength;Impaired balance (sitting and/or standing);Decreased safety awareness;Decreased knowledge of use of DME or AE;Pain OT Treatment Interventions: Self-care/ADL training;DME and/or AE instruction;Therapeutic activities;Patient/family education;Balance training   OT Goals Acute Rehab OT Goals OT Goal Formulation: With patient/family Time For Goal Achievement: 10/19/12 Potential to Achieve Goals: Good ADL Goals Pt Will Perform Grooming: with modified independence;Standing at sink ADL Goal: Grooming - Progress: Goal set today Pt Will Perform Upper Body Dressing: with modified independence;Sitting, chair;Sitting, bed ADL Goal: Upper Body Dressing - Progress: Goal set today Pt Will Perform Lower Body Dressing: with modified independence;Sit to stand from chair;Sit to stand from bed;with adaptive equipment ADL Goal: Lower Body Dressing - Progress: Goal set today Pt Will Transfer to Toilet: with modified independence;Ambulation;with DME;Comfort height toilet ADL Goal: Toilet Transfer - Progress: Goal set today Pt Will Perform Toileting - Clothing Manipulation: with modified independence;Standing ADL Goal: Toileting - Clothing Manipulation - Progress: Goal set today Pt Will Perform Toileting - Hygiene: with modified independence;Sit to stand from 3-in-1/toilet ADL Goal: Toileting - Hygiene - Progress: Goal set today Miscellaneous OT Goals Miscellaneous OT Goal #1: Pt will perform bed mobility with HOB flat at mod I level in prep for EOB ADLs. OT Goal: Miscellaneous Goal #1 - Progress: Goal set today Miscellaneous OT Goal #2: Pt will demonstrate safe use of RW during all functional transfers in order to decrease fall risk. OT Goal: Miscellaneous Goal #2 - Progress: Goal set today Miscellaneous OT Goal #3: Pt will perform dynamic standing balance task at mod I level >5 min in prep for ADLs. OT Goal: Miscellaneous Goal #3 - Progress: Goal set today  Visit Information  Last OT  Received On: 10/05/12 Assistance Needed: +1 PT/OT Co-Evaluation/Treatment: Yes    Subjective Data  Prior Functioning     Home Living Lives With: Spouse Available Help at Discharge: Family;Available 24 hours/day (supervision only) Type of Home: House Home Access: Stairs to enter [employer] of Steps: 7 Entrance Stairs-Rails: Right (house on the other side of the steps) Home Layout: Two level;Laundry or work area in Artist of Steps: does not need to go downstairs Bathroom Shower/Tub: Health visitor: Engineer, mining: Paediatric nurse with back;Walker - rolling;Grab bars in shower;Bedside commode/3-in-1 (raised toilet seat without rails) Prior Function Level of Independence: Independent with assistive device(s) (has been getting PTat home for balance prior to fall) Able to Take Stairs?: Yes Driving: No Vocation: Retired Musician: No difficulties         Vision/Perception     Cognition  Overall Cognitive Status: Impaired Area of Impairment: Problem solving;Memory Arousal/Alertness: Awake/alert Orientation Level: Appears intact for tasks assessed Behavior During Session: [facility] for tasks performed Memory Deficits: some difficulty remembering safe sequencing with RW x2 regardless of cues during session Problem Solving: decreased safe problem solving skills using RW in more dynamically challenging and tight spaces Cognition - Other Comments: very pleasant    Extremity/Trunk Assessment Right Upper Extremity Assessment RUE ROM/Strength/Tone: WFL for tasks assessed Left Upper Extremity Assessment LUE ROM/Strength/Tone: WFL for tasks assessed Right Lower Extremity Assessment RLE ROM/Strength/Tone: Deficits RLE ROM/Strength/Tone Deficits: grossly 4/5, generally weak needing upper extremities and therapist to assist with standing Left Lower Extremity Assessment LLE ROM/Strength/Tone:  Deficits LLE ROM/Strength/Tone Deficits: grossly 4/5, generally weak needing upper extremities and therapist to assist with standing     Mobility Bed Mobility Bed Mobility: Not assessed Details for Bed Mobility Assistance: pt sitting EOB Transfers Transfers: Sit to Stand;Stand to Sit Sit to Stand: 3: Mod assist;4: Min assist;With armrests;From chair/3-in-1;From bed;With upper extremity assist Stand to Sit: 4: Min assist;With armrests;To chair/3-in-1;To bed;With upper extremity assist Details for Transfer Assistance: from lower surface pt stood needing min-mod facilitation to achieve and maintain anterior translation during power up for tall standing, safety cues throughout; easier from higher surface and arm rests needing just minA     Shoulder Instructions     Exercise     Balance     End of Session OT - End of Session Equipment Utilized During Treatment: Gait belt Activity Tolerance: Patient tolerated treatment well Patient left: in chair;with call bell/phone within reach;with family/visitor present Nurse Communication: Mobility status  GO    10/05/2012 Cipriano Mile OTR/L Pager [phone] Office [phone]  Cipriano Mile 10/05/2012, 1:24 PM

## 2012-10-05 NOTE — Progress Notes (Signed)
Assessment:  1. Acute of CKD4 Scr improving to 2.85 (Scr 2.8-3.0 in 9/13). SPEP neg 2. Hx hypertension,  3. Vol overload on lasix, improving  4. 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 Hypokalemia  Plan:  1. cont diuresis 2. K replace  3. Would not give albumin 3 Agree with holding off on AV access given improvement in renal function and leg wound present  Subjective: Interval History: Still has foley  Objective: Vital signs in last 24 hours: Temp:  [98 F (36.7 C)-98.3 F (36.8 C)] 98 F (36.7 C) (12/12 0520) Pulse Rate:  [52-60] 60  (12/12 0520) Resp:  [18] 18  (12/12 0520) BP: (111-122)/(51-64) 118/64 mmHg (12/12 0520) SpO2:  [95 %-99 %] 95 % (12/12 0520) Weight:  [90.6 kg (199 lb 11.8 oz)] 90.6 kg (199 lb 11.8 oz) (12/12 0520) Weight change: -0.3 kg (-10.6 oz)  Intake/Output from previous day: 12/11 0701 - 12/12 0700 In: 765 [P.O.:480; I.V.:3; IV Piggyback:282] Out: 2126 [Urine:2125; Stool:1] Intake/Output this shift:   General appearance: alert and cooperative Lungs clear anterior Cor RRR Abd soft Ext 2+ left, bandage on right  Lab Results:  [facility] 10/05/12 0526 10/04/12 0555  WBC 6.5 5.7  HGB 10.2* 10.9*  HCT 30.9* 33.6*  PLT 147* 137*   BMET:  Basename 10/05/12 0526 10/04/12 0555  NA 143 145  K 3.3* 3.4*  CL 104 108  CO2 30 27  GLUCOSE 112* 90  BUN 56* 55*  CREATININE 2.85* 3.01*  CALCIUM 8.3* 8.3*   No results found for this basename: PTH:2 in the last 72 hours Iron Studies: No results found for this basename: IRON,TIBC,TRANSFERRIN,FERRITIN in the last 72 hours Studies/Results: No results found.  Scheduled:   . allopurinol  100 mg Oral Daily  . atorvastatin  40 mg Oral Daily  . furosemide  160 mg Intravenous Q12H  . [COMPLETED] furosemide  80 mg Intravenous Once  . isosorbide-hydrALAZINE  1 tablet Oral BID  . levothyroxine  25 mcg Oral QAC breakfast  . multivitamin with minerals  1 tablet Oral Daily  . potassium chloride  20 mEq  Oral BID  . potassium chloride  40 mEq Oral BID  . sodium chloride  3 mL Intravenous Q12H  . traZODone  150 mg Oral QHS  . vancomycin  750 mg Intravenous Q24H  . warfarin  3 mg Oral q1800  . Warfarin - Pharmacist Dosing Inpatient   Does not apply q1800  . [DISCONTINUED] furosemide  80 mg Intravenous Once  . [DISCONTINUED] potassium chloride  20 mEq Oral BID     LOS: 7 days   Lasondra Hodgkins C 10/05/2012,9:52 AM

## 2012-10-05 NOTE — Progress Notes (Addendum)
ANTICOAGULATION CONSULT NOTE - FOLLOW UP  Pharmacy Consult for Coumadin Indication: Afib  No Known Allergies  Patient Measurements: Height: 5\' 11"  (180.3 cm) Weight: 199 lb 11.8 oz (90.6 kg) IBW/kg (Calculated) : 75.3  Adjusted Body Weight:   Vital Signs: Temp: 98 F (36.7 C) (12/12 0520) Temp src: Oral (12/12 0520) BP: 118/64 mmHg (12/12 0520) Pulse Rate: 60  (12/12 0520) Intake/Output from previous day: 12/11 0701 - 12/12 0700 In: 765 [P.O.:480; I.V.:3; IV Piggyback:282] Out: 2126 [Urine:2125; Stool:1] Intake/Output from this shift:    Labs:  Basename 10/05/12 0526 10/04/12 0555 10/03/12 0500  WBC 6.5 5.7 --  HGB 10.2* 10.9* --  PLT 147* 137* --  LABCREA -- -- --  CREATININE 2.85* 3.01* 3.25*   Estimated Creatinine Clearance: 23.8 ml/min (by C-G formula based on Cr of 2.85). No results found for this basename: VANCOTROUGH:2,VANCOPEAK:2,VANCORANDOM:2,GENTTROUGH:2,GENTPEAK:2,GENTRANDOM:2,TOBRATROUGH:2,TOBRAPEAK:2,TOBRARND:2,AMIKACINPEAK:2,AMIKACINTROU:2,AMIKACIN:2, in the last 72 hours    Assessment:  Patient on Coumadin for hx Afib. INR (2.47 from 12/11) remains therapeutic on home regimen (3mg  daily) - will continue regimen and monitor INR on M/W/F to reduce lab draws. - H/H and Plts trending down - No significant bleeding reported  Goal of Therapy:  INR 2-3  Plan:  - Continue Coumadin 3mg  po daily - Check INR Mon, Wed, Fri

## 2012-10-06 LAB — PROTIME-INR: INR: 2.37 — ABNORMAL HIGH (ref 0.00–1.49)

## 2012-10-06 LAB — RENAL FUNCTION PANEL
CO2: 27 mEq/L (ref 19–32)
Calcium: 8.4 mg/dL (ref 8.4–10.5)
Creatinine, Ser: 2.77 mg/dL — ABNORMAL HIGH (ref 0.50–1.35)
GFR calc non Af Amer: 20 mL/min — ABNORMAL LOW (ref >90)
Glucose, Bld: 117 mg/dL — ABNORMAL HIGH (ref 70–99)
Phosphorus: 2.3 mg/dL (ref 2.3–4.6)

## 2012-10-06 MED ORDER — FUROSEMIDE 10 MG/ML IJ SOLN
120.0000 mg | Freq: Once | INTRAVENOUS | Status: AC
  Administered 2012-10-06: 120 mg via INTRAVENOUS
  Filled 2012-10-06: qty 12

## 2012-10-06 MED ORDER — POTASSIUM CHLORIDE CRYS ER 20 MEQ PO TBCR
40.0000 meq | EXTENDED_RELEASE_TABLET | Freq: Once | ORAL | Status: AC
  Administered 2012-10-06: 40 meq via ORAL
  Filled 2012-10-06: qty 2

## 2012-10-06 MED ORDER — FUROSEMIDE 10 MG/ML IJ SOLN
160.0000 mg | Freq: Three times a day (TID) | INTRAVENOUS | Status: DC
  Administered 2012-10-06 – 2012-10-13 (×20): 160 mg via INTRAVENOUS
  Filled 2012-10-06 (×23): qty 16

## 2012-10-06 MED ORDER — POTASSIUM CHLORIDE CRYS ER 20 MEQ PO TBCR
40.0000 meq | EXTENDED_RELEASE_TABLET | Freq: Two times a day (BID) | ORAL | Status: DC
  Administered 2012-10-07 – 2012-10-11 (×10): 40 meq via ORAL
  Filled 2012-10-06 (×7): qty 2
  Filled 2012-10-06: qty 1
  Filled 2012-10-06 (×5): qty 2

## 2012-10-06 NOTE — Progress Notes (Signed)
Subjective:  Patient denies any chest pain states breathing is improved leg swelling is improved. Patient being evaluated for inpatient rehabilitation  Objective:  Vital Signs in the last 24 hours: Temp:  [98.4 F (36.9 C)-99 F (37.2 C)] 99 F (37.2 C) (12/13 0500) Pulse Rate:  [59-62] 60  (12/13 0500) Resp:  [18] 18  (12/13 0500) BP: (106-109)/(52-60) 106/52 mmHg (12/13 0500) SpO2:  [100 %] 100 % (12/13 0500) Weight:  [91.8 kg (202 lb 6.1 oz)] 91.8 kg (202 lb 6.1 oz) (12/13 0500)  Intake/Output from previous day: 12/12 0701 - 12/13 0700 In: 1242 [P.O.:1060; IV Piggyback:182] Out: 1400 [Urine:1400] Intake/Output from this shift: Total I/O In: 290 [P.O.:240; IV Piggyback:50] Out: 650 [Urine:650]  Physical Exam: Neck: no adenopathy, no carotid bruit, no JVD and supple, symmetrical, trachea midline Lungs: Decreased breath sound at bases Heart: regular rate and rhythm, S1, S2 normal and Soft systolic murmur noted Abdomen: soft, non-tender; bowel sounds normal; no masses,  no organomegaly Extremities: No clubbing cyanosis 2+ edema noted right leg surgical dressing noted  Lab Results:  Basename 10/05/12 0526 10/04/12 0555  WBC 6.5 5.7  HGB 10.2* 10.9*  PLT 147* 137*    Basename 10/05/12 0526 10/04/12 0555  NA 143 145  K 3.3* 3.4*  CL 104 108  CO2 30 27  GLUCOSE 112* 90  BUN 56* 55*  CREATININE 2.85* 3.01*   No results found for this basename: TROPONINI:2,CK,MB:2 in the last 72 hours Hepatic Function Panel No results found for this basename: PROT,ALBUMIN,AST,ALT,ALKPHOS,BILITOT,BILIDIR,IBILI in the last 72 hours No results found for this basename: CHOL in the last 72 hours No results found for this basename: PROTIME in the last 72 hours  Imaging: Imaging results have been reviewed and No results found.  Cardiac Studies:  Assessment/Plan:  Resolving decompensated systolic heart failure  Coronary artery disease history of anteroseptal wall MI in the past   Ischemic dilated cardiomyopathy status post by V. ICD  Atrial fibrillation  Chronic kidney disease stage IV  Anemia  Hypothyroidism  Hyperalbuminemia  Chronic nonhealing ulcer right leg /hematoma  Hypokalemia Plan Continue present management Increase ambulation DC Foley Wean off dobutamine  LOS: 8 days    Annasophia Crocker N 10/06/2012, 1:05 PM

## 2012-10-06 NOTE — Progress Notes (Addendum)
Will follow/up w/ pt next week to see if ankle & pt have improved or if appropriate for CIR. [phone]

## 2012-10-06 NOTE — Progress Notes (Signed)
Assessment:  1. Acute of CKD4 Scr improving to 2.85 yesterday (Scr 2.8-3.0 in 9/13). SPEP neg  2. Hx hypertension,  3. Vol overload remains significant (wgt 90.2 on adm and today 91.8kg!) 4. 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 Hypokalemia  Plan:  1. I think we should attempt more aggressive diuresis given large volume on presacral exam.  Will check potassium first and increase lasix if K ok.  Subjective: Interval History: Still weak.  Not diuresing much. Objective: Vital signs in last 24 hours: Temp:  [97.9 F (36.6 C)-99 F (37.2 C)] 97.9 F (36.6 C) (12/13 1333) Pulse Rate:  [60-62] 61  (12/13 1333) Resp:  [18] 18  (12/13 1333) BP: (104-107)/(49-55) 104/49 mmHg (12/13 1333) SpO2:  [100 %] 100 % (12/13 1333) Weight:  [91.8 kg (202 lb 6.1 oz)] 91.8 kg (202 lb 6.1 oz) (12/13 0500) Weight change: 1.2 kg (2 lb 10.3 oz)  Intake/Output from previous day: 12/12 0701 - 12/13 0700 In: 1242 [P.O.:1060; IV Piggyback:182] Out: 1400 [Urine:1400] Intake/Output this shift: Total I/O In: 530 [P.O.:480; IV Piggyback:50] Out: 750 [Urine:750]  General appearance: alert and cooperative Resp: clear to auscultation bilaterally Chest wall: no tenderness Presacral edema is 2 to 3+ GI: soft, non-tender; bowel sounds normal; no masses,  no organomegaly Extremities: edema decreasing some  Lab Results:  [facility] 10/05/12 0526 10/04/12 0555  WBC 6.5 5.7  HGB 10.2* 10.9*  HCT 30.9* 33.6*  PLT 147* 137*   BMET:  Basename 10/05/12 0526 10/04/12 0555  NA 143 145  K 3.3* 3.4*  CL 104 108  CO2 30 27  GLUCOSE 112* 90  BUN 56* 55*  CREATININE 2.85* 3.01*  CALCIUM 8.3* 8.3*   No results found for this basename: PTH:2 in the last 72 hours Iron Studies: No results found for this basename: IRON,TIBC,TRANSFERRIN,FERRITIN in the last 72 hours Studies/Results: No results found.  Scheduled:   . allopurinol  100 mg Oral Daily  . atorvastatin  40 mg Oral Daily  . ceFEPime (MAXIPIME) IV   1 g Intravenous Q24H  . furosemide  160 mg Intravenous Q12H  . isosorbide-hydrALAZINE  1 tablet Oral BID  . levothyroxine  25 mcg Oral QAC breakfast  . multivitamin with minerals  1 tablet Oral Daily  . potassium chloride  20 mEq Oral BID  . sodium chloride  3 mL Intravenous Q12H  . traZODone  150 mg Oral QHS  . warfarin  3 mg Oral q1800  . Warfarin - Pharmacist Dosing Inpatient   Does not apply q1800     LOS: 8 days   [name] C 10/06/2012,2:39 PM

## 2012-10-06 NOTE — Progress Notes (Signed)
Noted pt with order to d/c foley.  Dr. Sharyn Lull informed that pt currently using condom cath. Instructed it was ok.  Amanda Pea, Charity fundraiser.

## 2012-10-06 NOTE — Progress Notes (Signed)
Physical Therapy Treatment Patient Details Name: [name] MRN: [id number] DOB: [birth year] Today's Date: 10/06/2012 Time: 4098-1191 PT Time Calculation (min): 35 min  PT Assessment / Plan / Recommendation Comments on Treatment Session  Still limited by left ankle but very motivated. Would still benefit from therapy prior to d/c home given his history of falls to maxmimize safety and strength for eventual d/c home with wife.     Follow Up Recommendations  CIR     Does the patient have the potential to tolerate intense rehabilitation     Barriers to Discharge        Equipment Recommendations  Rolling walker with 5" wheels    Recommendations for Other Services    Frequency Min 3X/week   Plan Discharge plan remains appropriate;Frequency remains appropriate    Precautions / Restrictions Precautions Precautions: Fall Restrictions Weight Bearing Restrictions: No   Pertinent Vitals/Pain 7/10 left ankle pain, called nursing station to let RN know    Mobility  Transfers Sit to Stand: 3: Mod assist;With upper extremity assist;From chair/3-in-1;With armrests Stand to Sit: 4: Min guard;To chair/3-in-1;With armrests Details for Transfer Assistance: facilitation to bring trunk anterior over BOS as well as power up (especially from lower surface), verbal cues for controlled descent and body positioning Ambulation/Gait Ambulation/Gait Assistance: 4: Min assist;4: Min guard Ambulation Distance (Feet): 80 Feet Assistive device: Rolling walker Ambulation/Gait Assistance Details: cues for tall posture and assist to negotiate RW especially with tight spaces and obstacles, facilitation for stability Gait Pattern: Decreased step length - right;Antalgic;Decreased stance time - left;Trunk flexed;Narrow base of support Stairs: No    Exercises General Exercises - Lower Extremity Long Arc Quad: AROM;Both;20 reps;Seated Hip Flexion/Marching: AROM;Both;10 reps;Standing Toe Raises:  AROM;Both;10 reps;Seated Heel Raises: AROM;Both;10 reps;Seated Mini-Sqauts: AROM;Both;10 reps;Standing     PT Goals Acute Rehab PT Goals PT Goal: Sit to Stand - Progress: Progressing toward goal PT Goal: Stand to Sit - Progress: Progressing toward goal PT Transfer Goal: Bed to Chair/Chair to Bed - Progress: Progressing toward goal PT Goal: Stand - Progress: Progressing toward goal PT Goal: Ambulate - Progress: Progressing toward goal PT Goal: Perform Home Exercise Program - Progress: Progressing toward goal  Visit Information  Last PT Received On: 10/06/12 Assistance Needed: +1    Subjective Data  Subjective: I've been hoping my physical therapist would come!   Cognition  Overall Cognitive Status: Impaired Area of Impairment: Problem solving Arousal/Alertness: Awake/alert Orientation Level: Appears intact for tasks assessed Behavior During Session: [facility] for tasks performed Cognition - Other Comments: still having difficulty problem solving RW in tight spaces    Balance  Static Standing Balance Static Standing - Balance Support: Bilateral upper extremity supported Static Standing - Level of Assistance: 5: Stand by assistance  End of Session PT - End of Session Equipment Utilized During Treatment: Gait belt Activity Tolerance: Patient tolerated treatment well Patient left: in chair;with call bell/phone within reach Nurse Communication: Mobility status   GP     [facility] HELEN 10/06/2012, 3:56 PM

## 2012-10-06 NOTE — Progress Notes (Signed)
ANTICOAGULATION CONSULT NOTE - FOLLOW UP  Pharmacy Consult for Coumadin Indication: Afib  No Known Allergies  Patient Measurements: Height: 5\' 11"  (180.3 cm) Weight: 202 lb 6.1 oz (91.8 kg) (bed) IBW/kg (Calculated) : 75.3  Adjusted Body Weight:   Vital Signs: Temp: 99 F (37.2 C) (12/13 0500) Temp src: Oral (12/13 0500) BP: 106/52 mmHg (12/13 0500) Pulse Rate: 60  (12/13 0500) Intake/Output from previous day: 12/12 0701 - 12/13 0700 In: 1242 [P.O.:1060; IV Piggyback:182] Out: 1400 [Urine:1400] Intake/Output from this shift:    Labs:  [facility] 10/05/12 0526 10/04/12 0555  WBC 6.5 5.7  HGB 10.2* 10.9*  PLT 147* 137*  LABCREA -- --  CREATININE 2.85* 3.01*   Estimated Creatinine Clearance: 23.9 ml/min (by C-G formula based on Cr of 2.85). No results found for this basename: VANCOTROUGH:2,VANCOPEAK:2,VANCORANDOM:2,GENTTROUGH:2,GENTPEAK:2,GENTRANDOM:2,TOBRATROUGH:2,TOBRAPEAK:2,TOBRARND:2,AMIKACINPEAK:2,AMIKACINTROU:2,AMIKACIN:2, in the last 72 hours    Assessment:  Patient on Coumadin for hx Afib. INR (2.37) remains therapeutic on home regimen (3mg  daily) - will continue regimen and monitor INR on M/W/F to reduce lab draws. - H/H and Plts trending down  - No significant bleeding reported  Goal of Therapy:  INR 2-3  Plan:  - Continue Coumadin 3mg  po daily - Check INR Mon, Wed, Fri  Thank you, Franchot Erichsen, Pharm.D. Clinical Pharmacist   10/06/2012 9:05 AM

## 2012-10-07 ENCOUNTER — Inpatient Hospital Stay (HOSPITAL_COMMUNITY): Payer: Medicare Other

## 2012-10-07 LAB — RENAL FUNCTION PANEL
Albumin: 2.2 g/dL — ABNORMAL LOW (ref 3.5–5.2)
BUN: 60 mg/dL — ABNORMAL HIGH (ref 6–23)
CO2: 27 mEq/L (ref 19–32)
Calcium: 8.2 mg/dL — ABNORMAL LOW (ref 8.4–10.5)
Chloride: 105 mEq/L (ref 96–112)
Creatinine, Ser: 2.89 mg/dL — ABNORMAL HIGH (ref 0.50–1.35)
GFR calc Af Amer: 22 mL/min — ABNORMAL LOW (ref >90)
GFR calc non Af Amer: 19 mL/min — ABNORMAL LOW (ref >90)
Glucose, Bld: 80 mg/dL (ref 70–99)
Phosphorus: 2.2 mg/dL — ABNORMAL LOW (ref 2.3–4.6)
Potassium: 3 mEq/L — ABNORMAL LOW (ref 3.5–5.1)
Sodium: 142 mEq/L (ref 135–145)

## 2012-10-07 MED ORDER — ISOSORB DINITRATE-HYDRALAZINE 20-37.5 MG PO TABS
1.0000 | ORAL_TABLET | Freq: Three times a day (TID) | ORAL | Status: DC
  Administered 2012-10-07 – 2012-10-09 (×8): 1 via ORAL
  Filled 2012-10-07 (×11): qty 1

## 2012-10-07 MED ORDER — DIGOXIN 125 MCG PO TABS
0.1250 mg | ORAL_TABLET | Freq: Every day | ORAL | Status: DC
  Administered 2012-10-07 – 2012-10-18 (×12): 0.125 mg via ORAL
  Filled 2012-10-07 (×12): qty 1

## 2012-10-07 NOTE — Progress Notes (Signed)
Subjective:  Patient denies any chest pain states breathing is improved  Objective:  Vital Signs in the last 24 hours: Temp:  [97.9 F (36.6 C)-98.1 F (36.7 C)] 98 F (36.7 C) (12/14 0512) Pulse Rate:  [59-61] 59  (12/14 0512) Resp:  [18] 18  (12/14 0512) BP: (104-128)/(49-68) 128/68 mmHg (12/14 0512) SpO2:  [92 %-100 %] 92 % (12/14 0512) Weight:  [92.4 kg (203 lb 11.3 oz)] 92.4 kg (203 lb 11.3 oz) (12/14 0512)  Intake/Output from previous day: 12/13 0701 - 12/14 0700 In: 2053.3 [P.O.:1260; I.V.:615.3; IV Piggyback:178] Out: 1825 [Urine:1825] Intake/Output from this shift: Total I/O In: 360 [P.O.:360] Out: -   Physical Exam: Neck: no adenopathy, no carotid bruit, no JVD and supple, symmetrical, trachea midline Lungs: Decreased breath sound at bases Heart: regular rate and rhythm, S1, S2 normal and Soft systolic murmur noted Abdomen: soft, non-tender; bowel sounds normal; no masses,  no organomegaly Extremities: No clubbing cyanosis 2+ edema right leg surgical dressing noted  Lab Results:  [facility] 10/05/12 0526  WBC 6.5  HGB 10.2*  PLT 147*    Basename 10/07/12 0530 10/06/12 1451  NA 142 141  K 3.0* 3.4*  CL 105 104  CO2 27 27  GLUCOSE 80 117*  BUN 60* 57*  CREATININE 2.89* 2.77*   No results found for this basename: TROPONINI:2,CK,MB:2 in the last 72 hours Hepatic Function Panel  Basename 10/07/12 0530  PROT --  ALBUMIN 2.2*  AST --  ALT --  ALKPHOS --  BILITOT --  BILIDIR --  IBILI --   No results found for this basename: CHOL in the last 72 hours No results found for this basename: PROTIME in the last 72 hours  Imaging: Imaging results have been reviewed and No results found.  Cardiac Studies:  Assessment/Plan:  Resolving decompensated systolic heart failure  Coronary artery disease history of anteroseptal wall MI in the past  Ischemic dilated cardiomyopathy status post by V. ICD  Atrial fibrillation  Chronic kidney disease stage IV   Anemia  Hypothyroidism  Hyperalbuminemia  Chronic nonhealing ulcer right leg /hematoma  Hypokalemia  Plan Replace K. DC dobutamine Add low-dose digoxin Increase Aldactone as per orders  LOS: 9 days    Baron Parmelee N 10/07/2012, 11:12 AM

## 2012-10-07 NOTE — Progress Notes (Signed)
Assessment:  1. Acute of CKD4 Scr improving to 2.85 yesterday (Scr 2.8-3.0 in 9/13). SPEP neg  2. Hx hypertension,  3. Vol overload remains significant and I am not sure he is improving 4. 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 Hypokalemia  Plan:  CXR  Subjective: Interval History: Feels OK Objective: Vital signs in last 24 hours: Temp:  [97.9 F (36.6 C)-98.1 F (36.7 C)] 98 F (36.7 C) (12/14 0512) Pulse Rate:  [59-61] 59  (12/14 0512) Resp:  [18] 18  (12/14 0512) BP: (104-128)/(49-68) 128/68 mmHg (12/14 0512) SpO2:  [92 %-100 %] 92 % (12/14 0512) Weight:  [203 lb 11.3 oz (92.4 kg)] 203 lb 11.3 oz (92.4 kg) (12/14 0512) Weight change: 1 lb 5.2 oz (0.6 kg)  Intake/Output from previous day: 12/13 0701 - 12/14 0700 In: 2053.3 [P.O.:1260; I.V.:615.3; IV Piggyback:178] Out: 1825 [Urine:1825] Intake/Output this shift: Total I/O In: 360 [P.O.:360] Out: -   General appearance: alert and cooperative Resp: decreased BS bilat Cardio:RRR GI: soft Extremities:RLE bandaged, lle 2+ Presacral edema 1-2+  Lab Results:  [facility] 10/05/12 0526  WBC 6.5  HGB 10.2*  HCT 30.9*  PLT 147*   BMET:  Basename 10/07/12 0530 10/06/12 1451  NA 142 141  K 3.0* 3.4*  CL 105 104  CO2 27 27  GLUCOSE 80 117*  BUN 60* 57*  CREATININE 2.89* 2.77*  CALCIUM 8.2* 8.4   No results found for this basename: PTH:2 in the last 72 hours Iron Studies: No results found for this basename: IRON,TIBC,TRANSFERRIN,FERRITIN in the last 72 hours Studies/Results: No results found.  Scheduled:   . allopurinol  100 mg Oral Daily  . atorvastatin  40 mg Oral Daily  . ceFEPime (MAXIPIME) IV  1 g Intravenous Q24H  . digoxin  0.125 mg Oral Daily  . furosemide  160 mg Intravenous TID  . isosorbide-hydrALAZINE  1 tablet Oral TID  . levothyroxine  25 mcg Oral QAC breakfast  . multivitamin with minerals  1 tablet Oral Daily  . potassium chloride  40 mEq Oral BID  . sodium chloride  3 mL Intravenous  Q12H  . traZODone  150 mg Oral QHS  . warfarin  3 mg Oral q1800  . Warfarin - Pharmacist Dosing Inpatient   Does not apply q1800     LOS: 9 days   Kristof Nadeem C 10/07/2012,1:06 PM

## 2012-10-08 DIAGNOSIS — N186 End stage renal disease: Secondary | ICD-10-CM

## 2012-10-08 DIAGNOSIS — I10 Essential (primary) hypertension: Secondary | ICD-10-CM

## 2012-10-08 DIAGNOSIS — I509 Heart failure, unspecified: Secondary | ICD-10-CM

## 2012-10-08 MED ORDER — POTASSIUM CHLORIDE CRYS ER 20 MEQ PO TBCR
40.0000 meq | EXTENDED_RELEASE_TABLET | Freq: Once | ORAL | Status: AC
  Administered 2012-10-08: 40 meq via ORAL
  Filled 2012-10-08: qty 2

## 2012-10-08 NOTE — Consult Note (Signed)
[facility] [place] Consultation  Requesting: Harwani/Powell Reason for consult: Hemodialysis access  History of Present Illness:  Patient is a 78 y.o. year old male who presents for placement of a permanent hemodialysis access. The patient is right handed.  He has a left side AICD.  The patient is not currently on hemodialysis.  The cause of renal failure is thought to be secondary to hypertension.  Pt was admitted with CHF.  Other chronic medical problems include a fib, CAD, hyperlipid.  He is on chronic coumadin.  He has also recently developed left arm swelling.  Past Medical History  Diagnosis Date  . Dyslipidemia   . CHF (congestive heart failure), NYHA class II   . Atrial fibrillation   . Prostate cancer   . Coronary artery disease   . Myocardial infarction   . Hypertension   . Depression   . Shortness of breath   . Chronic kidney disease     renal insufficiency  . ICD (implantable cardiac defibrillator) in place   . Gout     Past Surgical History  Procedure Date  . Medtronic biv icd 10/2006  . Pci/stent 02/2005  . Total knee arthroplasty      Social History History  Substance Use Topics  . Smoking status: Former Smoker    Quit date: 10/25/1968  . Smokeless tobacco: Never Used     Comment: stopped smoking in 1970  . Alcohol Use: No    Family History Family History  Problem Relation Age of Onset  . Diabetes Mother     Allergies  No Known Allergies   Current Facility-Administered Medications  Medication Dose Route Frequency Provider Last Rate Last Dose  . 0.9 %  sodium chloride infusion  250 mL Intravenous PRN Ricki Rodriguez, MD 10 mL/hr at 10/02/12 1224 250 mL at 10/02/12 1224  . acetaminophen (TYLENOL) tablet 650 mg  650 mg Oral Q4H PRN Ricki Rodriguez, MD   650 mg at 10/06/12 1626  . allopurinol (ZYLOPRIM) tablet 100 mg  100 mg Oral Daily Ricki Rodriguez, MD   100 mg at 10/08/12 1031  . atorvastatin (LIPITOR) tablet 40 mg  40 mg  Oral Daily Ricki Rodriguez, MD   40 mg at 10/08/12 1030  . ceFEPIme (MAXIPIME) 1 g in dextrose 5 % 50 mL IVPB  1 g Intravenous Q24H Robynn Pane, MD   1 g at 10/08/12 1209  . digoxin (LANOXIN) tablet 0.125 mg  0.125 mg Oral Daily Robynn Pane, MD   0.125 mg at 10/08/12 1030  . furosemide (LASIX) 160 mg in dextrose 5 % 50 mL IVPB  160 mg Intravenous TID Lauris Poag, MD   160 mg at 10/08/12 1031  . isosorbide-hydrALAZINE (BIDIL) 20-37.5 MG per tablet 1 tablet  1 tablet Oral TID Robynn Pane, MD   1 tablet at 10/08/12 1030  . levothyroxine (SYNTHROID, LEVOTHROID) tablet 25 mcg  25 mcg Oral QAC breakfast Ricki Rodriguez, MD   25 mcg at 10/08/12 [id number]  . multivitamin with minerals tablet 1 tablet  1 tablet Oral Daily Ricki Rodriguez, MD   1 tablet at 10/08/12 1030  . ondansetron (ZOFRAN) injection 4 mg  4 mg Intravenous Q6H PRN Ricki Rodriguez, MD      . potassium chloride SA (K-DUR,KLOR-CON) CR tablet 40 mEq  40 mEq Oral BID Lauris Poag, MD   40 mEq at 10/08/12 1030  . sodium chloride 0.9 % injection 3  mL  3 mL Intravenous Q12H Ricki Rodriguez, MD   3 mL at 10/08/12 1031  . sodium chloride 0.9 % injection 3 mL  3 mL Intravenous PRN Ricki Rodriguez, MD      . traZODone (DESYREL) tablet 150 mg  150 mg Oral QHS Ricki Rodriguez, MD   150 mg at 10/07/12 2057  . warfarin (COUMADIN) tablet 3 mg  3 mg Oral q1800 Dannielle Karvonen Frost, PHARMD   3 mg at 10/07/12 1705  . Warfarin - Pharmacist Dosing Inpatient   Does not apply q1800 Brittan Mapel [facility], PHARMD   1 each at 10/07/12 1708    ROS:   General:  No weight loss, Fever, chills  HEENT: No recent headaches, no nasal bleeding, no visual changes, no sore throat  Neurologic: No dizziness, blackouts, seizures. No recent symptoms of stroke or mini- stroke. No recent episodes of slurred speech, or temporary blindness.  Cardiac: No recent episodes of chest pain/pressure, no shortness of breath at rest.  + shortness of breath with exertion. + history  of atrial fibrillation or irregular heartbeat  Vascular: No history of rest pain in feet.  No history of claudication.  No history of non-healing ulcer, No history of DVT   Pulmonary: No home oxygen, no productive cough, no hemoptysis,  No asthma or wheezing  Musculoskeletal:  [x ] Arthritis, [ ]  Low back pain,  [ ]  Joint pain  Hematologic:No history of hypercoagulable state.  Gastrointestinal: No hematochezia or melena,  No gastroesophageal reflux, no trouble swallowing  Urinary: [x ] chronic Kidney disease, [ ]  on HD - [ ]  MWF or [ ]  TTHS, [ ]  Burning with urination, [ ]  Frequent urination, [ ]  Difficulty urinating;   Skin: No rashes  Psychological: No history of anxiety,  No history of depression   Physical Examination  Filed Vitals:   10/07/12 1646 10/07/12 2003 10/08/12 0519 10/08/12 1332  BP:  100/60 101/54 107/57  Pulse: 60 60 60 66  Temp:  98.4 F (36.9 C) 98.2 F (36.8 C) 97.9 F (36.6 C)  TempSrc:  Oral Oral Oral  Resp:  18 18 20   Height:      Weight:   201 lb 15.1 oz (91.6 kg)   SpO2:  100% 100% 98%    Body mass index is 28.17 kg/(m^2).  General:  Alert and oriented, no acute distress HEENT: Normal Neck: No bruit or JVD Pulmonary: Clear to auscultation bilaterally Cardiac: Regular Rate and Rhythm without murmur Gastrointestinal: Soft, non-tender, non-distended, no mass Skin: No rash Extremity Pulses:  Left antecubital IV, right cephalic vein IV, 2+ radial, brachial pulses bilaterally Musculoskeletal: No deformity trace lower extremity edema, left arm edema 50% larger than right  Neurologic: Upper and lower extremity motor 5/5 and symmetric but unable to fully abduct left shoulder  DATA: INR 2.3 on 12/13 BMET    Component Value Date/Time   NA 142 10/07/2012 0530   K 3.0* 10/07/2012 0530   CL 105 10/07/2012 0530   CO2 27 10/07/2012 0530   GLUCOSE 80 10/07/2012 0530   GLUCOSE 117* 11/04/2006 1154   BUN 60* 10/07/2012 0530   CREATININE 2.89*  10/07/2012 0530   CALCIUM 8.2* 10/07/2012 0530   GFRNONAA 19* 10/07/2012 0530   GFRAA 22* 10/07/2012 0530    CBC    Component Value Date/Time   WBC 6.5 10/05/2012 0526   RBC 3.52* 10/05/2012 0526   HGB 10.2* 10/05/2012 0526   HCT 30.9* 10/05/2012 0526   PLT  147* 10/05/2012 0526   MCV 87.8 10/05/2012 0526   MCH 29.0 10/05/2012 0526   MCHC 33.0 10/05/2012 0526   RDW 18.5* 10/05/2012 0526   LYMPHSABS 0.9 09/28/2012 1644   MONOABS 0.6 09/28/2012 1644   EOSABS 0.1 09/28/2012 1644   BASOSABS 0.0 09/28/2012 1644    ASSESSMENT: Needs hemodialysis access, limited options due to AICD, 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  Dr Lowell Guitar has also asked [place] to place diatek catheter at time of permanent access.  Pt also is currently coagulopathic secondary to coumadin.   PLAN:  Will defer to Dr Sharyn Lull whether bridging of his coumadin is necessary with heparin.  Will stop coumadin today.  Will need extremity vein mapping.  Needs IV removed from right arm cephalic vein and no further IV or blood draws right arm.  Access later this week pending INR and vein map results.  Fabienne Bruns, MD [facility] [place] Office: [phone] Pager: [phone]

## 2012-10-08 NOTE — Progress Notes (Signed)
Subjective:  Patient denies any chest pain or shortness of breath feels weak tired and activity very limited  Objective:  Vital Signs in the last 24 hours: Temp:  [97.4 F (36.3 C)-98.4 F (36.9 C)] 98.2 F (36.8 C) (12/15 0519) Pulse Rate:  [60-61] 60  (12/15 0519) Resp:  [18-20] 18  (12/15 0519) BP: (98-101)/(53-60) 101/54 mmHg (12/15 0519) SpO2:  [100 %] 100 % (12/15 0519) Weight:  [91.6 kg (201 lb 15.1 oz)] 91.6 kg (201 lb 15.1 oz) (12/15 0519)  Intake/Output from previous day: 12/14 0701 - 12/15 0700 In: 1405 [P.O.:1320; I.V.:19; IV Piggyback:66] Out: 950 [Urine:950] Intake/Output from this shift: Total I/O In: 243 [P.O.:240; I.V.:3] Out: -   Physical Exam: Neck: no adenopathy, no carotid bruit, no JVD and supple, symmetrical, trachea midline Lungs: Decreased breath sound at bases with occasional rales Heart: regular rate and rhythm, S1, S2 normal and Soft systolic murmur noted Abdomen: soft, non-tender; bowel sounds normal; no masses,  no organomegaly Extremities: Warfarin cyanosis 1+ edema noted in right leg and surgical dressing  Lab Results: No results found for this basename: WBC:2,HGB:2,PLT:2 in the last 72 hours  Basename 10/07/12 0530 10/06/12 1451  NA 142 141  K 3.0* 3.4*  CL 105 104  CO2 27 27  GLUCOSE 80 117*  BUN 60* 57*  CREATININE 2.89* 2.77*   No results found for this basename: TROPONINI:2,CK,MB:2 in the last 72 hours Hepatic Function Panel  Basename 10/07/12 0530  PROT --  ALBUMIN 2.2*  AST --  ALT --  ALKPHOS --  BILITOT --  BILIDIR --  IBILI --   No results found for this basename: CHOL in the last 72 hours No results found for this basename: PROTIME in the last 72 hours  Imaging: Imaging results have been reviewed and Dg Chest 2 View  10/07/2012  *RADIOLOGY REPORT*  Clinical Data: Follow up CHF  CHEST - 2 VIEW  Comparison: 09/29/2012  Findings: Left subclavian transvenous AICD leads project over right atrium, right ventricle and  coronary sinus. Enlargement of cardiac silhouette with pulmonary vascular congestion. Chronic elevation left diaphragm with left basilar atelectasis. Question small left pleural effusion. Minimal right basilar atelectasis. Mild chronic accentuation of perihilar markings without gross pulmonary edema.  Endplate spur formation thoracic spine.  IMPRESSION: Enlargement of cardiac silhouette with pulmonary vascular congestion. Bibasilar atelectasis greater on left.   Original Report Authenticated By: Ulyses Southward, M.D.     Cardiac Studies:  Assessment/Plan:  Resolving decompensated systolic heart failure  Coronary artery disease history of anteroseptal wall MI in the past  Ischemic dilated cardiomyopathy status post by V. ICD  Atrial fibrillation  Chronic kidney disease stage IV  Anemia  Hypothyroidism  Hyperalbuminemia  Chronic nonhealing ulcer right leg /hematoma  Hypokalemia  Plan Replace K. Vascular surgical consult for possible permanent shunt placement. Check labs in a.m.  LOS: 10 days    [name] N 10/08/2012, 11:39 AM

## 2012-10-08 NOTE — Progress Notes (Signed)
Assessment:  1. Acute of CKD4 Scr improving to 2.89 (Scr 2.8-3.0 in 9/13).  2. Cardiomyopathy with low BP 3. Vol overload remains significant 4. 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 Hypokalemia  Plan:  He will not be able to go home with current regimen, failed dobutamine and needs long term dialysis. Discussed w/ pt and Dr. Sharyn Lull; pt will need dialysis.  Will ask VVS to place PermCath & AV access on right & initiate TX. Will need to stop Bidil for BP. K replace  Subjective: Interval History: Not much mobility  Objective: Vital signs in last 24 hours: Temp:  [97.4 F (36.3 C)-98.4 F (36.9 C)] 98.2 F (36.8 C) (12/15 0519) Pulse Rate:  [60-61] 60  (12/15 0519) Resp:  [18-20] 18  (12/15 0519) BP: (98-101)/(53-60) 101/54 mmHg (12/15 0519) SpO2:  [100 %] 100 % (12/15 0519) Weight:  [201 lb 15.1 oz (91.6 kg)] 201 lb 15.1 oz (91.6 kg) (12/15 0519) Weight change: -1 lb 12.2 oz (-0.8 kg)  Intake/Output from previous day: 12/14 0701 - 12/15 0700 In: 1405 [P.O.:1320; I.V.:19; IV Piggyback:66] Out: 950 [Urine:950] Intake/Output this shift: Total I/O In: 243 [P.O.:240; I.V.:3] Out: -   General appearance: alert and cooperative Resp: diminished breath sounds bibasilar and dullness to percussion bibasilar Cardio: regular rate and rhythm, S1, S2 normal, no murmur, click, rub or gallop GI: soft, non-tender; bowel sounds normal; no masses,  no organomegaly Extremities: edema less  Lab Results: No results found for this basename: WBC:2,HGB:2,HCT:2,PLT:2 in the last 72 hours BMET:  Basename 10/07/12 0530 10/06/12 1451  NA 142 141  K 3.0* 3.4*  CL 105 104  CO2 27 27  GLUCOSE 80 117*  BUN 60* 57*  CREATININE 2.89* 2.77*  CALCIUM 8.2* 8.4   No results found for this basename: PTH:2 in the last 72 hours Iron Studies: No results found for this basename: IRON,TIBC,TRANSFERRIN,FERRITIN in the last 72 hours Studies/Results: Dg Chest 2 View  10/07/2012  *RADIOLOGY REPORT*   Clinical Data: Follow up CHF  CHEST - 2 VIEW  Comparison: 09/29/2012  Findings: Left subclavian transvenous AICD leads project over right atrium, right ventricle and coronary sinus. Enlargement of cardiac silhouette with pulmonary vascular congestion. Chronic elevation left diaphragm with left basilar atelectasis. Question small left pleural effusion. Minimal right basilar atelectasis. Mild chronic accentuation of perihilar markings without gross pulmonary edema.  Endplate spur formation thoracic spine.  IMPRESSION: Enlargement of cardiac silhouette with pulmonary vascular congestion. Bibasilar atelectasis greater on left.   Original Report Authenticated By: Ulyses Southward, M.D.     LOS: 10 days   Pavielle Biggar C 10/08/2012,11:29 AM

## 2012-10-09 DIAGNOSIS — N19 Unspecified kidney failure: Secondary | ICD-10-CM

## 2012-10-09 LAB — BASIC METABOLIC PANEL
BUN: 69 mg/dL — ABNORMAL HIGH (ref 6–23)
Creatinine, Ser: 3.17 mg/dL — ABNORMAL HIGH (ref 0.50–1.35)
GFR calc Af Amer: 20 mL/min — ABNORMAL LOW (ref >90)
GFR calc non Af Amer: 17 mL/min — ABNORMAL LOW (ref >90)
Glucose, Bld: 89 mg/dL (ref 70–99)

## 2012-10-09 LAB — CBC
HCT: 27.1 % — ABNORMAL LOW (ref 39.0–52.0)
MCH: 29.5 pg (ref 26.0–34.0)
MCHC: 33.9 g/dL (ref 30.0–36.0)
RDW: 18 % — ABNORMAL HIGH (ref 11.5–15.5)

## 2012-10-09 LAB — PROTIME-INR: Prothrombin Time: 24.8 seconds — ABNORMAL HIGH (ref 11.6–15.2)

## 2012-10-09 NOTE — Progress Notes (Addendum)
ANTICOAGULATION CONSULT NOTE - FOLLOW UP  Pharmacy Consult for Coumadin Indication: Afib  No Known Allergies  Patient Measurements: Height: 5\' 11"  (180.3 cm) Weight: 200 lb 6.4 oz (90.9 kg) (bs) IBW/kg (Calculated) : 75.3   Vital Signs: Temp: 98.4 F (36.9 C) (12/16 0459) Temp src: Oral (12/16 0459) BP: 126/58 mmHg (12/16 0459) Pulse Rate: 60  (12/16 0956) Intake/Output from previous day: 12/15 0701 - 12/16 0700 In: 1032 [P.O.:960; I.V.:6; IV Piggyback:66] Out: 1525 [Urine:1525] Intake/Output from this shift: Total I/O In: 240 [P.O.:240] Out: -   Labs:  Basename 10/09/12 0455 10/07/12 0530 10/06/12 1451  WBC 5.7 -- --  HGB 9.2* -- --  PLT 161 -- --  LABCREA -- -- --  CREATININE 3.17* 2.89* 2.77*   Estimated Creatinine Clearance: 21.4 ml/min (by C-G formula based on Cr of 3.17). No results found for this basename: VANCOTROUGH:2,VANCOPEAK:2,VANCORANDOM:2,GENTTROUGH:2,GENTPEAK:2,GENTRANDOM:2,TOBRATROUGH:2,TOBRAPEAK:2,TOBRARND:2,AMIKACINPEAK:2,AMIKACINTROU:2,AMIKACIN:2, in the last 72 hours    Assessment: Patient on Coumadin for hx Afib. INR remains therapeutic on home regimen of 3mg  daily. No bleeding noted, CBC stable.  Of note, Dr. Darrick Penna note from 12/15 mentions holding Coumadin for HD cath placement. Coumadin was charted as given last night. I see no order to hold.  Goal of Therapy:  INR 2-3  Plan:  - Continue Coumadin 3 mg po daily - Check INR Mon, Wed, Fri - MD, please discontinue Coumadin order if intention is to hold for procedure  [facility], Pharm.D., BCPS Clinical Pharmacist Pager: [phone] 10/09/2012 11:55 AM   Addendum: Will begin heparin when INR <2 with no bolus, INR >2 currently INR in am  [facility], Williams.D., BCPS Clinical Pharmacist Pager: [phone] 10/09/2012 1:01 PM

## 2012-10-09 NOTE — Progress Notes (Signed)
Subjective: hungry "I'm still really swollen"  Objective Vital signs in last 24 hours: Filed Vitals:   10/08/12 1332 10/08/12 2042 10/09/12 0459 10/09/12 0956  BP: 107/57 129/70 126/58   Pulse: 66 60 60 60  Temp: 97.9 F (36.6 C) 97.5 F (36.4 C) 98.4 F (36.9 C)   TempSrc: Oral Oral Oral   Resp: 20 18 18    Height:      Weight:   90.9 kg (200 lb 6.4 oz)   SpO2: 98% 94% 96%    Weight change: -0.7 kg (-1 lb 8.7 oz)  Intake/Output Summary (Last 24 hours) at 10/09/12 1238 Last data filed at 10/09/12 0900  Gross per 24 hour  Intake   1029 ml  Output   1525 ml  Net   -496 ml   Weights: 10/09/12 0459 90.9 kg (200 lb 6.4 oz) -- -- NG  10/08/12 0519 91.6 kg (201 lb 15.1 oz) -- -- RP  10/07/12 0512 92.4 kg (203 lb 11.3 oz) -- -- RP  10/06/12 0500 91.8 kg (202 lb 6.1 oz) -- -- RP  10/05/12 0520 90.6 kg (199 lb 11.8 oz) -- -- [facility]  10/04/12 0543 90.9 kg (200 lb 6.4 oz) -- -- [facility]  10/03/12 0453 90.538 kg (199 lb 9.6 oz) -- -- AM  10/02/12 0535 89.812 kg (198 lb) -- -- DA  10/01/12 0557 90.3 kg (199 lb 1.2 oz) -- -- TL  09/30/12 0455 90.3 kg (199 lb 1.2 oz) -- -- KC  09/29/12 0535 89.4 kg (197 lb 1.5 oz) -- -- KC  09/28/12 1540 90.2 kg (198 lb 13.7 oz)  Physical Exam:  Blood pressure 126/58, pulse 60, temperature 98.4 F (36.9 C), temperature source Oral, resp. rate 18, height 5\' 11"  (1.803 m), weight 90.9 kg (200 lb 6.4 oz), SpO2 96.00%. Very nice, pleasant BM Eating lunch Ant fairly clear; posteriorly diminished breath sounds Left ICD in place Abdomen soft and not tender 4+ edema of both lower extremities Dressing over right calf leg ulcer (not removed) Left arm with significant edema as well  Labs: Basic Metabolic Panel:  Lab 10/09/12 6213 10/07/12 0530 10/06/12 1451 10/05/12 0526 10/04/12 0555 10/03/12 0500  NA 143 142 141 143 145 143  K 3.6 3.0* 3.4* 3.3* 3.4* 2.8*  CL 106 105 104 104 108 106  CO2 27 27 27 30 27 29   GLUCOSE 89 80 117* 112* 90 83  BUN 69* 60* 57* 56*  55* 54*  CREATININE 3.17* 2.89* 2.77* 2.85* 3.01* 3.25*  ALB -- -- -- -- -- --  CALCIUM 8.2* 8.2* 8.4 8.3* 8.3* 8.2*  PHOS -- 2.2* 2.3 -- -- 2.2*   Liver Function Tests:  Lab 10/07/12 0530 10/06/12 1451 10/03/12 0500  AST -- -- --  ALT -- -- --  ALKPHOS -- -- --  BILITOT -- -- --  PROT -- -- --  ALBUMIN 2.2* 2.2* 2.2*   No results found for this basename: LIPASE:3,AMYLASE:3 in the last 168 hours No results found for this basename: AMMONIA:3 in the last 168 hours CBC:  Lab 10/09/12 0455 10/05/12 0526 10/04/12 0555  WBC 5.7 6.5 5.7  NEUTROABS -- -- --  HGB 9.2* 10.2* 10.9*  HCT 27.1* 30.9* 33.6*  MCV 86.9 87.8 88.4  PLT 161 147* 137*   Studies/Results: Dg Chest 2 View  10/07/2012  *RADIOLOGY REPORT*  Clinical Data: Follow up CHF  CHEST - 2 VIEW  Comparison: 09/29/2012  Findings: Left subclavian transvenous AICD leads project over right atrium, right  ventricle and coronary sinus. Enlargement of cardiac silhouette with pulmonary vascular congestion. Chronic elevation left diaphragm with left basilar atelectasis. Question small left pleural effusion. Minimal right basilar atelectasis. Mild chronic accentuation of perihilar markings without gross pulmonary edema.  Endplate spur formation thoracic spine.  IMPRESSION: Enlargement of cardiac silhouette with pulmonary vascular congestion. Bibasilar atelectasis greater on left.   Original Report Authenticated By: Ulyses Southward, M.D.    Medications:      . allopurinol  100 mg Oral Daily  . atorvastatin  40 mg Oral Daily  . ceFEPime (MAXIPIME) IV  1 g Intravenous Q24H  . digoxin  0.125 mg Oral Daily  . furosemide  160 mg Intravenous TID  . isosorbide-hydrALAZINE  1 tablet Oral TID  . levothyroxine  25 mcg Oral QAC breakfast  . multivitamin with minerals  1 tablet Oral Daily  . potassium chloride  40 mEq Oral BID  . sodium chloride  3 mL Intravenous Q12H  . traZODone  150 mg Oral QHS  . warfarin  3 mg Oral q1800  . Warfarin -  Pharmacist Dosing Inpatient   Does not apply q1800    I  have reviewed scheduled and prn medications.  ASSESSMENT/Recommendations  1. Acute kidney injury on CKD4 Creatinine around 3 but totally refractory to diuretics and dobutamine for heart failure Has lost no weight since admission Needs long term dialysis for cardiorenal syndrome - will be end stage renal disease (secondary to HTN plus cardiorenal syndrome)  Will need right sided permcath and AVF/AVG (probable left SCV stenosis plus thrombosed cephalic branch on v map)) Dr. Darrick Penna has seen/vein mapping done I stopped coumadin today/pharmacy can bridge with heparin Once access in will start HD Will start outpt placement process for HD ([facility] - lives in [place] area) Show videos Check iron studies, PTH Start Aranesp when starts dialysis   2. Cardiomyopathy with low BP  3. Vol overload remains significant  4. 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

## 2012-10-09 NOTE — Progress Notes (Signed)
Arm vein marginal bilaterally Will plan for right arm AV graft on Wednesday Dec 18 by my partner Dr Imogene Burn as well as Diatek at that time.  Will need INR <1.6  Fabienne Bruns, MD [facility] [place] Office: [phone] Pager: [phone]

## 2012-10-09 NOTE — Progress Notes (Addendum)
VASCULAR LAB PRELIMINARY  PRELIMINARY  PRELIMINARY  PRELIMINARY  Right  Upper Extremity Vein Map    Cephalic  Segment Diameter Depth Comment  1. Axilla 3.15mm mm   2. Mid upper arm 2.29mm mm   3. Above AC 1.58mm mm   4. In [facility] 1.62mm mm   5. Below AC 2.34mm mm branch  6. Mid forearm 2.11mm mm   7. Wrist 2.70mm mm    mm mm    mm mm    mm mm    Basilic  Segment Diameter Depth Comment  1. Axilla mm mm   2. Mid upper arm 2.20mm 6.29mm Origin   3. Above [facility] 2.62mm 6.61mm   4. In [facility] 2.90mm 3.4mm branch  5. Below AC 1.39mm 2.44mm   6. Mid forearm 1.34mm 1.35mm   7. Wrist 1.61mm 1.60mm    mm mm    mm mm    mm mm       Left Upper Extremity Vein Map    Cephalic  Segment Diameter Depth Comment  1. Axilla 3.34mm mm   2. Mid upper arm 2.73mm mm   3. Above AC 2.67mm mm Branch with IV is thrombosed  4. In AC 2.17mm mm   5. Below AC 2.75mm mm   6. Mid forearm 2.82mm mm   7. Wrist 2.1mm mm    mm mm    mm mm    mm mm    Basilic  Segment Diameter Depth Comment  1. Axilla mm mm   2. Mid upper arm 3.73mm 21.102mm Origin   3. Above AC 3.89mm 21mm   4. In [facility] 3.45mm 10.49mm   5. Below AC 2.45mm 6.19mm   6. Mid forearm 2.2mm 7.29mm   7. Wrist 2mm 2.35mm    mm mm    mm mm    mm mm       Farrel Demark, RDMS, RVT  10/09/2012, 9:35 AM

## 2012-10-09 NOTE — Progress Notes (Signed)
Note plans for HD.  Will follow pt to see participation/progress in tx, and, medical situation to determine appropriateness for CIR. [phone]

## 2012-10-09 NOTE — Progress Notes (Signed)
Subjective:  Patient denies any chest pain or shortness of breath. Activity very limited.  Objective:  Vital Signs in the last 24 hours: Temp:  [97.5 F (36.4 C)-98.4 F (36.9 C)] 98.4 F (36.9 C) (12/16 0459) Pulse Rate:  [60-66] 60  (12/16 0956) Resp:  [18-20] 18  (12/16 0459) BP: (107-129)/(57-70) 126/58 mmHg (12/16 0459) SpO2:  [94 %-98 %] 96 % (12/16 0459) Weight:  [90.9 kg (200 lb 6.4 oz)] 90.9 kg (200 lb 6.4 oz) (12/16 0459)  Intake/Output from previous day: 12/15 0701 - 12/16 0700 In: 1032 [P.O.:960; I.V.:6; IV Piggyback:66] Out: 1525 [Urine:1525] Intake/Output from this shift: Total I/O In: 240 [P.O.:240] Out: -   Physical Exam: Neck: no adenopathy, no carotid bruit, no JVD and supple, symmetrical, trachea midline Lungs: Decrease vessel at bases with basilar Rales Heart: regular rate and rhythm, S1, S2 normal and Soft systolic murmur and S3 gallop noted Abdomen: soft, non-tender; bowel sounds normal; no masses,  no organomegaly Extremities: No clubbing cyanosis 2+ edema noted right leg  Lab Results:  [facility] 10/09/12 0455  WBC 5.7  HGB 9.2*  PLT 161    Basename 10/09/12 0455 10/07/12 0530  NA 143 142  K 3.6 3.0*  CL 106 105  CO2 27 27  GLUCOSE 89 80  BUN 69* 60*  CREATININE 3.17* 2.89*   No results found for this basename: TROPONINI:2,CK,MB:2 in the last 72 hours Hepatic Function Panel  Basename 10/07/12 0530  PROT --  ALBUMIN 2.2*  AST --  ALT --  ALKPHOS --  BILITOT --  BILIDIR --  IBILI --   No results found for this basename: CHOL in the last 72 hours No results found for this basename: PROTIME in the last 72 hours  Imaging: Imaging results have been reviewed and Dg Chest 2 View  10/07/2012  *RADIOLOGY REPORT*  Clinical Data: Follow up CHF  CHEST - 2 VIEW  Comparison: 09/29/2012  Findings: Left subclavian transvenous AICD leads project over right atrium, right ventricle and coronary sinus. Enlargement of cardiac silhouette with pulmonary  vascular congestion. Chronic elevation left diaphragm with left basilar atelectasis. Question small left pleural effusion. Minimal right basilar atelectasis. Mild chronic accentuation of perihilar markings without gross pulmonary edema.  Endplate spur formation thoracic spine.  IMPRESSION: Enlargement of cardiac silhouette with pulmonary vascular congestion. Bibasilar atelectasis greater on left.   Original Report Authenticated By: Ulyses Southward, M.D.     Cardiac Studies:  Assessment/Plan:  Resolving decompensated systolic heart failure  Coronary artery disease history of anteroseptal wall MI in the past  Ischemic dilated cardiomyopathy status post by V. ICD  Atrial fibrillation  Chronic kidney disease stage IV  Anemia  Hypothyroidism  Hyperalbuminemia  Chronic nonhealing ulcer right leg /hematoma  Plan Start heparin once INR below 2 per pharmacy no bolus Continue rest of meds  LOS: 11 days    Corry Ihnen N 10/09/2012, 12:42 PM

## 2012-10-09 NOTE — Consult Note (Signed)
Wound care follow-up:  Right leg greatly improved since previous assessment.  Several previous wounds have healed.  Remaining areas .2X.2X.2cm  .8X.4X.2cm  .3X.3X.2cm  All with red wound bed and mod red drainage; no fluctuance or odor, much less painful to touch.  Decreased amt edema to right leg.  Continue present plan of care with Aquacel to promote healing.  Cammie Mcgee, RN, MSN, [employer]  [phone]

## 2012-10-10 ENCOUNTER — Encounter (HOSPITAL_COMMUNITY): Payer: Self-pay | Admitting: Anesthesiology

## 2012-10-10 LAB — CBC
HCT: 29.2 % — ABNORMAL LOW (ref 39.0–52.0)
MCHC: 32.5 g/dL (ref 30.0–36.0)
MCV: 88.5 fL (ref 78.0–100.0)
Platelets: 175 10*3/uL (ref 150–400)
RDW: 17.8 % — ABNORMAL HIGH (ref 11.5–15.5)
WBC: 5 10*3/uL (ref 4.0–10.5)

## 2012-10-10 LAB — RENAL FUNCTION PANEL
Albumin: 2.1 g/dL — ABNORMAL LOW (ref 3.5–5.2)
BUN: 71 mg/dL — ABNORMAL HIGH (ref 6–23)
Calcium: 8.4 mg/dL (ref 8.4–10.5)
Glucose, Bld: 109 mg/dL — ABNORMAL HIGH (ref 70–99)
Phosphorus: 2.3 mg/dL (ref 2.3–4.6)
Potassium: 3.3 mEq/L — ABNORMAL LOW (ref 3.5–5.1)
Sodium: 142 mEq/L (ref 135–145)

## 2012-10-10 LAB — PROTIME-INR: INR: 2.33 — ABNORMAL HIGH (ref 0.00–1.49)

## 2012-10-10 MED ORDER — ISOSORB DINITRATE-HYDRALAZINE 20-37.5 MG PO TABS
1.0000 | ORAL_TABLET | Freq: Three times a day (TID) | ORAL | Status: DC
  Administered 2012-10-10 – 2012-10-18 (×23): 1 via ORAL
  Filled 2012-10-10 (×27): qty 1

## 2012-10-10 MED ORDER — PHYTONADIONE 5 MG PO TABS
5.0000 mg | ORAL_TABLET | Freq: Once | ORAL | Status: AC
  Administered 2012-10-10: 5 mg via ORAL
  Filled 2012-10-10 (×2): qty 1

## 2012-10-10 NOTE — Progress Notes (Signed)
Physical Therapy Treatment Patient Details Name: [name] MRN: [id number] DOB: July 28, 1930 Today's Date: 10/10/2012 Time: 6962-9528 PT Time Calculation (min): 21 min  PT Assessment / Plan / Recommendation Comments on Treatment Session  Limited session because during exercises pt's condom catheter came off. Because of difficulties getting condom catheter back on I will try to come back later today if time allows. If unable I would recommend nursing staff assist him with walking.     Follow Up Recommendations  CIR     Does the patient have the potential to tolerate intense rehabilitation     Barriers to Discharge        Equipment Recommendations  Rolling walker with 5" wheels    Recommendations for Other Services    Frequency     Plan Discharge plan remains appropriate;Frequency remains appropriate    Precautions / Restrictions Precautions Precautions: Fall Restrictions Weight Bearing Restrictions: No       Mobility  Bed Mobility Bed Mobility: Not assessed Transfers Transfers: Not assessed    Exercises General Exercises - Lower Extremity Ankle Circles/Pumps: AROM;Both;20 reps;Seated Long Arc Quad: AROM;Both;20 reps;Seated Hip Flexion/Marching: AROM;Both;10 reps;Seated     PT Goals Acute Rehab PT Goals PT Goal: Perform Home Exercise Program - Progress: Progressing toward goal  Visit Information  Last PT Received On: 10/10/12 Assistance Needed: +1    Subjective Data  Subjective: I don't think I can walk without some protection (pt worried about condom catheter coming off)   Cognition  Overall Cognitive Status: Appears within functional limits for tasks assessed/performed Arousal/Alertness: Awake/alert Orientation Level: Appears intact for tasks assessed Behavior During Session: [facility] for tasks performed    Balance     End of Session PT - End of Session Activity Tolerance: Patient tolerated treatment well Patient left: in chair;with call bell/phone  within reach Nurse Communication: Mobility status   GP     [facility] HELEN 10/10/2012, 2:07 PM

## 2012-10-10 NOTE — Progress Notes (Signed)
Subjective:  Patient is any chest pain or shortness of breath up walking in room with the help of walker. Right leg wound healing slowly  Objective:  Vital Signs in the last 24 hours: Temp:  [97.4 F (36.3 C)-98.2 F (36.8 C)] 98.2 F (36.8 C) (12/17 0506) Pulse Rate:  [59-60] 59  (12/17 0506) Resp:  [18-20] 20  (12/17 0506) BP: (114-126)/(55-57) 126/57 mmHg (12/17 0506) SpO2:  [97 %-100 %] 99 % (12/17 0506) Weight:  [90.674 kg (199 lb 14.4 oz)] 90.674 kg (199 lb 14.4 oz) (12/17 0506)  Intake/Output from previous day: 12/16 0701 - 12/17 0700 In: 1686 [P.O.:1438; IV Piggyback:248] Out: 2675 [Urine:2675] Intake/Output from this shift:    Physical Exam: Neck: no adenopathy, no carotid bruit, no JVD and supple, symmetrical, trachea midline Lungs: Decreased breath sound at bases air entry improved Heart: regular rate and rhythm, S1, S2 normal and Soft systolic murmur noted Abdomen: soft, non-tender; bowel sounds normal; no masses,  no organomegaly Extremities: No clubbing cyanosis 1+ edema noted  Lab Results:  Basename 10/10/12 0550 10/09/12 0455  WBC 5.0 5.7  HGB 9.5* 9.2*  PLT 175 161    Basename 10/10/12 0550 10/09/12 0455  NA 142 143  K 3.3* 3.6  CL 105 106  CO2 27 27  GLUCOSE 109* 89  BUN 71* 69*  CREATININE 2.99* 3.17*   No results found for this basename: TROPONINI:2,CK,MB:2 in the last 72 hours Hepatic Function Panel  Basename 10/10/12 0550  PROT --  ALBUMIN 2.1*  AST --  ALT --  ALKPHOS --  BILITOT --  BILIDIR --  IBILI --   No results found for this basename: CHOL in the last 72 hours No results found for this basename: PROTIME in the last 72 hours  Imaging: Imaging results have been reviewed and No results found.  Cardiac Studies:  Assessment/Plan:  Resolving decompensated systolic heart failure  Coronary artery disease history of anteroseptal wall MI in the past  Ischemic dilated cardiomyopathy status post by V. ICD  Atrial fibrillation   Chronic kidney disease stage IV  Anemia  Hypothyroidism  Hyperalbuminemia  Chronic nonhealing ulcer right leg /hematoma  Plan Restart hydralazine as blood pressure tolerates   LOS: 12 days    Tramar Brueckner N 10/10/2012, 9:56 AM

## 2012-10-10 NOTE — Progress Notes (Signed)
Occupational Therapy Treatment Patient Details Name: TATEN MERROW MRN: [id number] DOB: 10-28-1929 Today's Date: 10/10/2012 Time: 7829-5621 OT Time Calculation (min): 41 min  OT Assessment / Plan / Recommendation Comments on Treatment Session Pt making excellent progress. Pt with improved mobility and strength. Pt completing mobility for ADL @ overall S level. Will further assess appropriate D/C plans after surgery. Pt would have to be able to negotiatte 6-7 STE. Wife can assist minimally with ADL.     Follow Up Recommendations  CIR    Barriers to Discharge   none    Equipment Recommendations    TBA   Recommendations for Other Services Rehab consult  Frequency Min 2X/week   Plan Discharge plan remains appropriate    Precautions / Restrictions Precautions Precautions: Fall Restrictions Weight Bearing Restrictions: No   Pertinent Vitals/Pain No c/o pain    ADL  Grooming: Supervision/safety Where Assessed - Grooming: Unsupported standing Upper Body Bathing: Set up;Supervision/safety Where Assessed - Upper Body Bathing: Supported sitting Lower Body Bathing: Minimal assistance Where Assessed - Lower Body Bathing: Unsupported sit to stand Upper Body Dressing: Minimal assistance Where Assessed - Upper Body Dressing: Supported sitting Lower Body Dressing: Moderate assistance Where Assessed - Lower Body Dressing: Unsupported sit to stand Toilet Transfer: Supervision/safety Toilet Transfer Method: Sit to stand Toilet Transfer Equipment: Bedside commode;Other (comment) (over toilet) Toileting - Clothing Manipulation and Hygiene: Supervision/safety Where Assessed - Toileting Clothing Manipulation and Hygiene: Sit to stand from 3-in-1 or toilet Equipment Used: Gait belt;Rolling walker Transfers/Ambulation Related to ADLs: Mod assist with RW for ambulation in room and hallway.  Pt requires assist and max verbal cueing to safely manuever RW.   ADL Comments: Pt with good use of RW  with only occasional cues for RW use. Pt able to verbalize safe technique learned from earlier sessions with therapists. A to donna socks    OT Diagnosis:    OT Problem List:   OT Treatment Interventions:     OT Goals Acute Rehab OT Goals OT Goal Formulation: With patient/family Time For Goal Achievement: 10/19/12 Potential to Achieve Goals: Good ADL Goals Pt Will Perform Grooming: with modified independence;Standing at sink ADL Goal: Grooming - Progress: Progressing toward goals Pt Will Perform Upper Body Dressing: with modified independence;Sitting, chair;Sitting, bed ADL Goal: Upper Body Dressing - Progress: Progressing toward goals Pt Will Perform Lower Body Dressing: with modified independence;Sit to stand from chair;Sit to stand from bed;with adaptive equipment ADL Goal: Lower Body Dressing - Progress: Progressing toward goals Pt Will Transfer to Toilet: with modified independence;Ambulation;with DME;Comfort height toilet ADL Goal: Toilet Transfer - Progress: Progressing toward goals Pt Will Perform Toileting - Clothing Manipulation: with modified independence;Standing ADL Goal: Toileting - Clothing Manipulation - Progress: Progressing toward goals Pt Will Perform Toileting - Hygiene: with modified independence;Sit to stand from 3-in-1/toilet ADL Goal: Toileting - Hygiene - Progress: Progressing toward goals Miscellaneous OT Goals Miscellaneous OT Goal #1: Pt will perform bed mobility with HOB flat at mod I level in prep for EOB ADLs. Miscellaneous OT Goal #2: Pt will demonstrate safe use of RW during all functional transfers in order to decrease fall risk. OT Goal: Miscellaneous Goal #2 - Progress: Met Miscellaneous OT Goal #3: Pt will perform dynamic standing balance task at mod I level >5 min in prep for ADLs. OT Goal: Miscellaneous Goal #3 - Progress: Progressing toward goals  Visit Information  Last OT Received On: 10/10/12 Assistance Needed: +1    Subjective Data       Prior  Functioning       Cognition  Overall Cognitive Status: Appears within functional limits for tasks assessed/performed Arousal/Alertness: Awake/alert Orientation Level: Appears intact for tasks assessed Behavior During Session: [facility] for tasks performed    Mobility  Shoulder Instructions Bed Mobility Bed Mobility: Not assessed Transfers Transfers: Sit to Stand;Stand to Sit Sit to Stand: 5: Supervision;With upper extremity assist;From chair/3-in-1 Stand to Sit: 5: Supervision;With upper extremity assist;To chair/3-in-1 Details for Transfer Assistance: Safe technique       Exercises  General Exercises - Upper Extremity Shoulder Flexion: Strengthening;Both;10 reps Shoulder Extension: Strengthening;Both;10 reps Shoulder ABduction: Strengthening;Both;10 reps Shoulder ADduction: Strengthening;Both;10 reps Shoulder Horizontal ABduction: Strengthening;Both;10 reps Shoulder Horizontal ADduction: Strengthening;Both;10 reps Elbow Flexion: Strengthening;Both;10 reps Elbow Extension: Strengthening;Both;10 reps (LUE completed as allowed by ROM limitations) General Exercises - Lower Extremity Ankle Circles/Pumps: AROM;Both;20 reps;Seated Long Arc Quad: AROM;Both;20 reps;Seated Hip Flexion/Marching: AROM;Both;10 reps;Seated   Balance  S   End of Session OT - End of Session Equipment Utilized During Treatment: Gait belt Activity Tolerance: Patient tolerated treatment well Patient left: in chair;with call bell/phone within reach;with family/visitor present Nurse Communication: Mobility status  GO     Sharlynn Seckinger,HILLARY 10/10/2012, 3:59 PM [facility], OTR/L  [phone] 10/10/2012

## 2012-10-10 NOTE — Progress Notes (Signed)
ANTICOAGULATION CONSULT NOTE - Follow Up Consult  Pharmacy Consult for Heparin/Coumadin Indication: atrial fibrillation  No Known Allergies  Patient Measurements: Height: 5\' 11"  (180.3 cm) Weight: 199 lb 14.4 oz (90.674 kg) (a scale) IBW/kg (Calculated) : 75.3  Heparin Dosing Weight: 90.7 kg  Vital Signs: Temp: 97.5 F (36.4 C) (12/17 1328) Temp src: Oral (12/17 1328) BP: 118/52 mmHg (12/17 1328) Pulse Rate: 60  (12/17 1328)  Labs:  [facility] 10/10/12 0550 10/09/12 0455  HGB 9.5* 9.2*  HCT 29.2* 27.1*  PLT 175 161  APTT -- --  LABPROT 24.5* 24.8*  INR 2.33* 2.37*  HEPARINUNFRC -- --  CREATININE 2.99* 3.17*  CKTOTAL -- --  CKMB -- --  TROPONINI -- --    Estimated Creatinine Clearance: 22.7 ml/min (by C-G formula based on Cr of 2.99).   Medications:  Scheduled:    . allopurinol  100 mg Oral Daily  . atorvastatin  40 mg Oral Daily  . ceFEPime (MAXIPIME) IV  1 g Intravenous Q24H  . digoxin  0.125 mg Oral Daily  . furosemide  160 mg Intravenous TID  . isosorbide-hydrALAZINE  1 tablet Oral TID  . levothyroxine  25 mcg Oral QAC breakfast  . multivitamin with minerals  1 tablet Oral Daily  . [COMPLETED] phytonadione  5 mg Oral Once  . potassium chloride  40 mEq Oral BID  . sodium chloride  3 mL Intravenous Q12H  . traZODone  150 mg Oral QHS  . [DISCONTINUED] isosorbide-hydrALAZINE  1 tablet Oral TID  . [DISCONTINUED] Warfarin - Pharmacist Dosing Inpatient   Does not apply q1800    Assessment: 78 yo male on Coumadin PTA for afib. Coumadin has been on hold (last dose 12/15) for procedure of AV graft plus Diatek planned for 12/18. Patient is to be bridged with Heparin when INR <2 and the goal INR for procedure is <1.6. INR today is 2.33. Vitamin K 5mg  po given today around 1300. Will check repeat INR at 2200 tonight to assess need to begin heparin.   Patient has probable left SCV stenosis plus thrombosed cephalic branch seen on vein mapping. No bleeding noted. H/H is  decreased from admission but appears stable. Platelets are wnl.   Goal of Therapy:  INR <2, then to begin heparin Heparin level 0.3-0.7 units/ml Monitor platelets by anticoagulation protocol: Yes   Plan:  - Follow up INR ordered for 2200 tonight - If INR <2, may begin bridge with heparin, no bolus  Lillia Pauls, PharmD Clinical Pharmacist Pager: [phone] Phone: [phone] 10/10/2012 1:52 PM

## 2012-10-10 NOTE — Progress Notes (Addendum)
Subjective:  Up in the chair  "Just waiting for things to get done" Not SOB "Waterlogged"  Objective Vital signs in last 24 hours: Filed Vitals:   10/09/12 1404 10/09/12 2100 10/10/12 0506 10/10/12 1111  BP: 117/55 114/56 126/57 115/60  Pulse: 59 60 59 59  Temp: 98.2 F (36.8 C) 97.4 F (36.3 C) 98.2 F (36.8 C) 98.1 F (36.7 C)  TempSrc: Oral Oral Oral Oral  Resp: 18 20 20 20   Height:      Weight:   90.674 kg (199 lb 14.4 oz)   SpO2: 97% 100% 99% 100%   Weight change: -0.226 kg (-8 oz)  Intake/Output Summary (Last 24 hours) at 10/10/12 1116 Last data filed at 10/10/12 0700  Gross per 24 hour  Intake   1140 ml  Output   2075 ml  Net   -935 ml  Physical Exam:  Blood pressure 115/60, pulse 59, temperature 98.1 F (36.7 C), temperature source Oral, resp. rate 20, height 5\' 11"  (1.803 m), weight 90.674 kg (199 lb 14.4 oz), SpO2 100.00%.  Weights:  10/10/12          90.67 10/09/12 0459 90.9 kg (200 lb 6.4 oz) -- -- NG  10/08/12 0519 91.6 kg (201 lb 15.1 oz) -- -- RP  10/07/12 0512 92.4 kg (203 lb 11.3 oz) -- -- RP  10/06/12 0500 91.8 kg (202 lb 6.1 oz) -- -- RP  10/05/12 0520 90.6 kg (199 lb 11.8 oz) -- -- [facility]  10/04/12 0543 90.9 kg (200 lb 6.4 oz) -- -- [facility]  10/03/12 0453 90.538 kg (199 lb 9.6 oz) -- -- AM  10/02/12 0535 89.812 kg (198 lb) -- -- DA  10/01/12 0557 90.3 kg (199 lb 1.2 oz) -- -- TL  09/30/12 0455 90.3 kg (199 lb 1.2 oz) -- -- [facility]  09/29/12 0535 89.4 kg (197 lb 1.5 oz) -- -- [facility]  09/28/12 1540 90.2 kg (198 lb 13.7 oz)  Remains with generalized anasarca with abdominal wall, sacral edema Breath sounds diminished Abdomen without focal tenderness 2-3+ pretibial edema, diffuse body wall edema, left arm edema Pacer left chest  Labs: Basic Metabolic Panel:  Lab 10/10/12 9604 10/09/12 0455 10/07/12 0530 10/06/12 1451 10/05/12 0526 10/04/12 0555  NA 142 143 142 141 143 145  K 3.3* 3.6 3.0* 3.4* 3.3* 3.4*  CL 105 106 105 104 104 108  CO2 27 27 27 27 30 27    GLUCOSE 109* 89 80 117* 112* 90  BUN 71* 69* 60* 57* 56* 55*  CREATININE 2.99* 3.17* 2.89* 2.77* 2.85* 3.01*  ALB -- -- -- -- -- --  CALCIUM 8.4 8.2* 8.2* 8.4 8.3* 8.3*  PHOS 2.3 -- 2.2* 2.3 -- --   Liver Function Tests:  Lab 10/10/12 0550 10/07/12 0530 10/06/12 1451  AST -- -- --  ALT -- -- --  ALKPHOS -- -- --  BILITOT -- -- --  PROT -- -- --  ALBUMIN 2.1* 2.2* 2.2*   No results found for this basename: LIPASE:3,AMYLASE:3 in the last 168 hours No results found for this basename: AMMONIA:3 in the last 168 hours CBC:  Lab 10/10/12 0550 10/09/12 0455 10/05/12 0526 10/04/12 0555  WBC 5.0 5.7 6.5 5.7  NEUTROABS -- -- -- --  HGB 9.5* 9.2* 10.2* 10.9*  HCT 29.2* 27.1* 30.9* 33.6*  MCV 88.5 86.9 87.8 88.4  PLT 175 161 147* 137*    Results for KYM, FENTER (MRN [id number]) as of 10/10/2012 11:11  Ref. Range 09/29/2012 11:29  Iron Latest Range: 42-135 ug/dL 50  UIBC Latest Range: 125-400 ug/dL 161  TIBC Latest Range: 215-435 ug/dL 096 (L)  Saturation Ratios Latest Range: 20-55 % 24  Ferritin Latest Range: 22-322 ng/mL 104    Medications:      . allopurinol  100 mg Oral Daily  . atorvastatin  40 mg Oral Daily  . ceFEPime (MAXIPIME) IV  1 g Intravenous Q24H  . digoxin  0.125 mg Oral Daily  . furosemide  160 mg Intravenous TID  . isosorbide-hydrALAZINE  1 tablet Oral TID  . levothyroxine  25 mcg Oral QAC breakfast  . multivitamin with minerals  1 tablet Oral Daily  . potassium chloride  40 mEq Oral BID  . sodium chloride  3 mL Intravenous Q12H  . traZODone  150 mg Oral QHS    I  have reviewed scheduled and prn medications.  ASSESSMENT/RECOMMENDATIONS 78 year old BM with baseline CKD 4 (2.8 in September/peak 4 in December)   with refractory heart failure/edema/cardiorenal picture and lack of response to diuretics  1. Acute kidney injury on CKD4 - will be ESRD  Creatinine around 3 but totally refractory to diuretics and dobutamine for heart failure  Has lost no  weight since admission  Has gross anasarca Needs long term dialysis for cardiorenal syndrome - will be end stage renal disease (secondary to HTN plus cardiorenal syndrome)  Will need right sided permcath and AVF/AVG (probable left SCV stenosis plus thrombosed cephalic branch on v map))  Dr. Darrick Penna has seen/vein mapping done  OR planned for Wednesday if INR low enough I stopped coumadin 12/16/pharmacy can bridge with heparin INR still too high for procedure Will give vitamin K today Potassium low - on replacement. Once access in will start HD  Will start outpt placement process for HD (should CLIP to [facility] - lives in [place] area)   2. Cardiomyopathy with low BP  3. Vol overload remains significant  4. 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

## 2012-10-10 NOTE — Progress Notes (Signed)
INR still elevated.  Will recheck tomorrow but most likely graft placement will have to be delayed until INR <1.6  Fabienne Bruns, MD [facility] Office: [phone] Pager: [phone]

## 2012-10-11 ENCOUNTER — Encounter (HOSPITAL_COMMUNITY)
Admission: AD | Disposition: A | Discharge status: Skilled Nursing Facility | Payer: Self-pay | Source: Ambulatory Visit | Attending: Cardiovascular Disease

## 2012-10-11 LAB — RENAL FUNCTION PANEL
CO2: 25 mEq/L (ref 19–32)
Calcium: 8.5 mg/dL (ref 8.4–10.5)
Chloride: 108 mEq/L (ref 96–112)
GFR calc Af Amer: 21 mL/min — ABNORMAL LOW (ref >90)
GFR calc non Af Amer: 18 mL/min — ABNORMAL LOW (ref >90)
Sodium: 144 mEq/L (ref 135–145)

## 2012-10-11 LAB — PROTIME-INR
INR: 1.99 — ABNORMAL HIGH (ref 0.00–1.49)
Prothrombin Time: 21.8 seconds — ABNORMAL HIGH (ref 11.6–15.2)

## 2012-10-11 LAB — CBC
MCH: 28.6 pg (ref 26.0–34.0)
Platelets: 177 10*3/uL (ref 150–400)
RBC: 3.36 MIL/uL — ABNORMAL LOW (ref 4.22–5.81)
WBC: 5.8 10*3/uL (ref 4.0–10.5)

## 2012-10-11 SURGERY — ARTERIOVENOUS (AV) FISTULA CREATION
Anesthesia: Monitor Anesthesia Care | Site: Arm Lower | Laterality: Right

## 2012-10-11 MED ORDER — PHYTONADIONE 5 MG PO TABS
5.0000 mg | ORAL_TABLET | Freq: Once | ORAL | Status: AC
  Administered 2012-10-11: 5 mg via ORAL
  Filled 2012-10-11: qty 1

## 2012-10-11 MED ORDER — DARBEPOETIN ALFA-POLYSORBATE 60 MCG/0.3ML IJ SOLN
60.0000 ug | INTRAMUSCULAR | Status: DC
  Administered 2012-10-11: 60 ug via SUBCUTANEOUS
  Filled 2012-10-11: qty 0.3

## 2012-10-11 MED ORDER — HEPARIN (PORCINE) IN NACL 100-0.45 UNIT/ML-% IJ SOLN
1300.0000 [IU]/h | INTRAMUSCULAR | Status: DC
  Administered 2012-10-11: 1300 [IU]/h via INTRAVENOUS
  Filled 2012-10-11 (×3): qty 250

## 2012-10-11 MED ORDER — HEPARIN (PORCINE) IN NACL 100-0.45 UNIT/ML-% IJ SOLN
1700.0000 [IU]/h | INTRAMUSCULAR | Status: DC
  Administered 2012-10-11 – 2012-10-12 (×2): 1500 [IU]/h via INTRAVENOUS
  Filled 2012-10-11 (×4): qty 250

## 2012-10-11 NOTE — Progress Notes (Signed)
Physical Therapy Treatment Patient Details Name: [name] MRN: [id number] DOB: [birth year] Today's Date: 10/11/2012 Time:  -     PT Assessment / Plan / Recommendation Comments on Treatment Session  Progressing well however wife still concerned about taking patient home because of safety especially with stairs. Will progress stair training as able but would still benefit from short CIR stay to maximize mobility and safety in prep for d/c home, especially given recent fall. Procedure still on hold because of INR will reassess d/c plan after procedure complete.     Follow Up Recommendations  CIR     Does the patient have the potential to tolerate intense rehabilitation     Barriers to Discharge        Equipment Recommendations  None recommended by PT    Recommendations for Other Services    Frequency     Plan Discharge plan remains appropriate;Frequency remains appropriate    Precautions / Restrictions         Mobility  Bed Mobility Bed Mobility: Supine to Sit Supine to Sit: 5: Supervision;HOB flat Details for Bed Mobility Assistance: verbal cues for ease of movement but no physical assist needed Transfers Transfers: Sit to Stand;Stand to Sit Sit to Stand: 5: Supervision;With upper extremity assist;From bed Stand to Sit: 5: Supervision;With upper extremity assist;To bed Details for Transfer Assistance: min v/c's needed occasionally for safe technique and positioning Ambulation/Gait Ambulation/Gait Assistance: 4: Min guard Ambulation Distance (Feet): 200 Feet Assistive device: Rolling walker Ambulation/Gait Assistance Details: cues for tall posture and relaxed shoulders with focus on decreased reliance on RW for trunk strengthening General Gait Details: left ankle still looks stiff but patient reports decreased pain, still favors LLE because of arthritis in left knee, demonstrates varus deformity Stairs: Yes Stairs Assistance: 4: Min assist Stairs Assistance Details  (indicate cue type and reason): cues for safe sequencing and minA for stabiilty; because of weakness and arthritis in left knee pt has difficulty ascending with this leg, pt instructed in going up with stronger lower extremity first for improved safety Stair Management Technique: One rail Right Number of Stairs: 4  (2 steps + 1 step x2 )    Exercises General Exercises - Lower Extremity Ankle Circles/Pumps: AROM;Both;Other (comment) (30 reps)     PT Goals Acute Rehab PT Goals PT Goal: Supine/Side to Sit - Progress: Progressing toward goal PT Goal: Sit to Stand - Progress: Progressing toward goal PT Goal: Stand to Sit - Progress: Progressing toward goal PT Transfer Goal: Bed to Chair/Chair to Bed - Progress: Progressing toward goal PT Goal: Ambulate - Progress: Progressing toward goal PT Goal: Perform Home Exercise Program - Progress: Progressing toward goal  Visit Information  Last PT Received On: 10/11/12 Assistance Needed: +1    Subjective Data      Cognition       Balance     End of Session PT - End of Session Equipment Utilized During Treatment: Gait belt Activity Tolerance: Patient tolerated treatment well Patient left: in chair;with call bell/phone within reach Nurse Communication: Mobility status   GP     [facility] HELEN 10/11/2012, 1:31 PM

## 2012-10-11 NOTE — Progress Notes (Signed)
Subjective:  "just waiting" Not dyspneic but complains "just so swollen" Still on high dose lasix INR still too high for procedures  Objective Vital signs in last 24 hours: Filed Vitals:   10/10/12 2018 10/11/12 0454 10/11/12 1110 10/11/12 1113  BP: 128/56 140/82  129/59  Pulse: 59 62 64 64  Temp: 98 F (36.7 C) 98.2 F (36.8 C)  98.3 F (36.8 C)  TempSrc: Oral Oral  Oral  Resp: 19 20  20   Height:      Weight:  87.6 kg (193 lb 2 oz)    SpO2: 97% 91%  95%   Weight change: -3.074 kg (-6 lb 12.4 oz)  Intake/Output Summary (Last 24 hours) at 10/11/12 1224 Last data filed at 10/11/12 0900  Gross per 24 hour  Intake    480 ml  Output    950 ml  Net   -470 ml   Physical Exam:  Blood pressure 129/59, pulse 64, temperature 98.3 F (36.8 C), temperature source Oral, resp. rate 20, height 5\' 11"  (1.803 m), weight 87.6 kg (193 lb 2 oz), SpO2 95.00%. Gross anasarca with sacral, abd wall, le edema Lungs grossly clear but diminished BS Pacer left chest with left arm edema  Labs: Basic Metabolic Panel:  Lab 10/11/12 1610 10/10/12 0550 10/09/12 0455 10/07/12 0530 10/06/12 1451 10/05/12 0526  NA 144 142 143 142 141 143  K 3.5 3.3* 3.6 3.0* 3.4* 3.3*  CL 108 105 106 105 104 104  CO2 25 27 27 27 27 30   GLUCOSE 115* 109* 89 80 117* 112*  BUN 70* 71* 69* 60* 57* 56*  CREATININE 3.05* 2.99* 3.17* 2.89* 2.77* 2.85*  ALB -- -- -- -- -- --  CALCIUM 8.5 8.4 8.2* 8.2* 8.4 8.3*  PHOS 1.8* 2.3 -- 2.2* 2.3 --  CBC:  Lab 10/11/12 0540 10/10/12 0550 10/09/12 0455 10/05/12 0526  WBC 5.8 5.0 5.7 6.5  NEUTROABS -- -- -- --  HGB 9.6* 9.5* 9.2* 10.2*  HCT 29.1* 29.2* 27.1* 30.9*  MCV 86.6 88.5 86.9 87.8  PLT 177 175 161 147*  Studies/Results: No results found. Medications:    . heparin 1,300 Units/hr (10/11/12 1047)      . allopurinol  100 mg Oral Daily  . atorvastatin  40 mg Oral Daily  . ceFEPime (MAXIPIME) IV  1 g Intravenous Q24H  . digoxin  0.125 mg Oral Daily  . furosemide   160 mg Intravenous TID  . isosorbide-hydrALAZINE  1 tablet Oral TID  . levothyroxine  25 mcg Oral QAC breakfast  . multivitamin with minerals  1 tablet Oral Daily  . phytonadione  5 mg Oral Once  . potassium chloride  40 mEq Oral BID  . sodium chloride  3 mL Intravenous Q12H  . traZODone  150 mg Oral QHS    I  have reviewed scheduled and prn medications.  ASSESSMENT/RECOMMENDATIONS   78 year old BM with baseline CKD 4 (2.8 in September/peak 4 in December)  with refractory heart failure/edema/cardiorenal picture and lack of response to diuretics ( decompensated systolic heart failure, coronary artery disease history of anteroseptal wall MI in the past , ischemic dilated cardiomyopathy status post by V. ICD, atrial fibrillation) anemia, hypothyroidism)   1. Acute kidney injury on CKD4 - will be ESRD  Creatinine around 3 but totally refractory to diuretics (and prev dobutamine )for heart failure/edema  Has lost no weight since admission  Still on IV lasix pending access for dialysis Has gross anasarca  Needs long term  dialysis for cardiorenal syndrome - will be end stage renal disease (secondary to HTN plus cardiorenal syndrome)  Will need right sided permcath and AVF/AVG (probable left SCV stenosis plus thrombosed cephalic branch on v map))  INR still too high for access procedure  Will give vitamin K again today  Potassium low - on replacement.  Once access in will start HD  Have started outpt placement process for HD (should CLIP to [facility] - lives in [place] area)   2. Cardiomyopathy with low BP  3. Vol overload remains significant  4. 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

## 2012-10-11 NOTE — Progress Notes (Signed)
ANTICOAGULATION CONSULT NOTE  Pharmacy Consult for Heparin Indication: atrial fibrillation  No Known Allergies  Patient Measurements: Height: 5\' 11"  (180.3 cm) Weight: 193 lb 2 oz (87.6 kg) (bed scale) IBW/kg (Calculated) : 75.3  Heparin Dosing Weight: 90.7 kg  Vital Signs: Temp: 98.2 F (36.8 C) (12/18 0454) Temp src: Oral (12/18 0454) BP: 140/82 mmHg (12/18 0454) Pulse Rate: 62  (12/18 0454)  Labs:  Basename 10/11/12 0540 10/10/12 2256 10/10/12 0550 10/09/12 0455  HGB 9.6* -- 9.5* --  HCT 29.1* -- 29.2* 27.1*  PLT 177 -- 175 161  APTT -- -- -- --  LABPROT 21.8* 22.5* 24.5* --  INR 1.99* 2.08* 2.33* --  HEPARINUNFRC -- -- -- --  CREATININE 3.05* -- 2.99* 3.17*  CKTOTAL -- -- -- --  CKMB -- -- -- --  TROPONINI -- -- -- --    Estimated Creatinine Clearance: 20.6 ml/min (by C-G formula based on Cr of 3.05).  Assessment: 78 yo male with h/o afib, Coumadin on hold, for heparin  Goal of Therapy:  Heparin level 0.3-0.7 units/ml Monitor platelets by anticoagulation protocol: Yes   Plan:  Start heparin 1300 units/hr Check heparin level in 8 hours.  Geannie Risen, PharmD, BCPS 10/11/2012 7:06 AM

## 2012-10-11 NOTE — Progress Notes (Signed)
[facility]     INR still elevated. Will recheck tomorrow but most likely graft placement will have to be delayed until INR <1.6      INR today 1.99    Sherriann Szuch MAUREEN PA-C

## 2012-10-11 NOTE — Progress Notes (Signed)
ANTICOAGULATION CONSULT NOTE  Pharmacy Consult for Heparin Indication: atrial fibrillation  No Known Allergies  Patient Measurements: Height: 5\' 11"  (180.3 cm) Weight: 193 lb 2 oz (87.6 kg) (bed scale) IBW/kg (Calculated) : 75.3  Heparin Dosing Weight: 90.7 kg  Vital Signs: Temp: 97.8 F (36.6 C) (12/18 1300) Temp src: Oral (12/18 1300) BP: 106/51 mmHg (12/18 1300) Pulse Rate: 58  (12/18 1300)  Labs:  Basename 10/11/12 1822 10/11/12 0540 10/10/12 2256 10/10/12 0550 10/09/12 0455  HGB -- 9.6* -- 9.5* --  HCT -- 29.1* -- 29.2* 27.1*  PLT -- 177 -- 175 161  APTT -- -- -- -- --  LABPROT -- 21.8* 22.5* 24.5* --  INR -- 1.99* 2.08* 2.33* --  HEPARINUNFRC 0.13* -- -- -- --  CREATININE -- 3.05* -- 2.99* 3.17*  CKTOTAL -- -- -- -- --  CKMB -- -- -- -- --  TROPONINI -- -- -- -- --    Estimated Creatinine Clearance: 20.6 ml/min (by C-G formula based on Cr of 3.05).  Assessment: 78 yo male with h/o afib, Coumadin on hold, on IV heparin at 1300 units/hr.  Initial heparin level low (0.13).  Spoke with RN, no issues with heparin infusion.  No bleeding noted per chart notes.  Goal of Therapy:  Heparin level 0.3-0.7 units/ml Monitor platelets by anticoagulation protocol: Yes   Plan:  Increase IV heparin to 1500 units/hr. Recheck heparin level in 8 hrs. Continue daily heparin level and CBC.  Reece Leader, Pharm D 10/11/2012 7:44 PM

## 2012-10-11 NOTE — Progress Notes (Signed)
Subjective:  Patient denies any chest pain or shortness of breath awaiting AV graft. INR slowly trending down  Objective:  Vital Signs in the last 24 hours: Temp:  [97.5 F (36.4 C)-98.3 F (36.8 C)] 98.3 F (36.8 C) (12/18 1113) Pulse Rate:  [59-64] 64  (12/18 1113) Resp:  [18-20] 20  (12/18 1113) BP: (118-140)/(52-82) 129/59 mmHg (12/18 1113) SpO2:  [91 %-100 %] 95 % (12/18 1113) Weight:  [87.6 kg (193 lb 2 oz)] 87.6 kg (193 lb 2 oz) (12/18 0454)  Intake/Output from previous day: 12/17 0701 - 12/18 0700 In: 480 [P.O.:480] Out: 1850 [Urine:1850] Intake/Output from this shift:    Physical Exam: Neck: no adenopathy, no carotid bruit, no JVD and supple, symmetrical, trachea midline Lungs:  decreased breath sound at bases Heart: regular rate and rhythm, S1, S2 normal and Soft systolic murmur noted Abdomen: soft, non-tender; bowel sounds normal; no masses,  no organomegaly Extremities: No clubbing cyanosis 2+ edema noted  Lab Results:  Basename 10/11/12 0540 10/10/12 0550  WBC 5.8 5.0  HGB 9.6* 9.5*  PLT 177 175    Basename 10/11/12 0540 10/10/12 0550  NA 144 142  K 3.5 3.3*  CL 108 105  CO2 25 27  GLUCOSE 115* 109*  BUN 70* 71*  CREATININE 3.05* 2.99*   No results found for this basename: TROPONINI:2,CK,MB:2 in the last 72 hours Hepatic Function Panel  Basename 10/11/12 0540  PROT --  ALBUMIN 2.0*  AST --  ALT --  ALKPHOS --  BILITOT --  BILIDIR --  IBILI --   No results found for this basename: CHOL in the last 72 hours No results found for this basename: PROTIME in the last 72 hours  Imaging: Imaging results have been reviewed and No results found.  Cardiac Studies:  Assessment/Plan:  Resolving decompensated systolic heart failure  Coronary artery disease history of anteroseptal wall MI in the past  Ischemic dilated cardiomyopathy status post by V. ICD  Atrial fibrillation  Chronic kidney disease stage IV  Anemia  Hypothyroidism   Hyperalbuminemia  Chronic nonhealing ulcer right leg / Plan Continue present management Awaiting AV graft placement  LOS: 13 days    Ovid Witman N 10/11/2012, 12:15 PM

## 2012-10-11 NOTE — Progress Notes (Signed)
Patient has made good progress functionally with therapy. Await initiation of dialysis. May not need inpt rehab admission. Open to home health therapy or inpt rehab admission depending on recommendations. We will follow his progress. [phone]

## 2012-10-12 LAB — RENAL FUNCTION PANEL
BUN: 68 mg/dL — ABNORMAL HIGH (ref 6–23)
Calcium: 8.7 mg/dL (ref 8.4–10.5)
Creatinine, Ser: 3.02 mg/dL — ABNORMAL HIGH (ref 0.50–1.35)
Glucose, Bld: 87 mg/dL (ref 70–99)
Phosphorus: 1.7 mg/dL — ABNORMAL LOW (ref 2.3–4.6)
Sodium: 145 mEq/L (ref 135–145)

## 2012-10-12 LAB — CBC
MCV: 88.1 fL (ref 78.0–100.0)
Platelets: 207 10*3/uL (ref 150–400)
RDW: 17.9 % — ABNORMAL HIGH (ref 11.5–15.5)
WBC: 5.8 10*3/uL (ref 4.0–10.5)

## 2012-10-12 MED ORDER — SODIUM CHLORIDE 0.9 % IV SOLN
125.0000 mg | INTRAVENOUS | Status: DC
  Filled 2012-10-12: qty 10

## 2012-10-12 MED ORDER — PHYTONADIONE 5 MG PO TABS
5.0000 mg | ORAL_TABLET | Freq: Once | ORAL | Status: AC
  Administered 2012-10-12: 5 mg via ORAL
  Filled 2012-10-12: qty 1

## 2012-10-12 MED ORDER — DARBEPOETIN ALFA-POLYSORBATE 60 MCG/0.3ML IJ SOLN: 60.0000 ug | INTRAMUSCULAR | Status: DC

## 2012-10-12 MED ORDER — POTASSIUM CHLORIDE CRYS ER 20 MEQ PO TBCR
40.0000 meq | EXTENDED_RELEASE_TABLET | Freq: Three times a day (TID) | ORAL | Status: DC
  Administered 2012-10-12 – 2012-10-13 (×3): 40 meq via ORAL
  Filled 2012-10-12 (×4): qty 2

## 2012-10-12 MED ORDER — POTASSIUM CHLORIDE CRYS ER 20 MEQ PO TBCR
40.0000 meq | EXTENDED_RELEASE_TABLET | Freq: Once | ORAL | Status: AC
  Administered 2012-10-12: 40 meq via ORAL

## 2012-10-12 NOTE — Progress Notes (Signed)
ANTICOAGULATION CONSULT NOTE - Follow Up Consult  Pharmacy Consult for Heparin Indication: atrial fibrillation  No Known Allergies  Patient Measurements: Height: 5\' 11"  (180.3 cm) Weight: 192 lb 14.4 oz (87.5 kg) IBW/kg (Calculated) : 75.3  Heparin Dosing Weight: 90.7 kg  Vital Signs: Temp: 98.4 F (36.9 C) (12/19 0456) Temp src: Oral (12/19 0456) BP: 108/61 mmHg (12/19 0456) Pulse Rate: 72  (12/19 1106)  Labs:  Basename 10/12/12 1220 10/12/12 0413 10/11/12 1822 10/11/12 0540 10/10/12 0550  HGB -- 9.9* -- 9.6* --  HCT -- 30.4* -- 29.1* 29.2*  PLT -- 207 -- 177 175  APTT -- -- -- -- --  LABPROT 19.1* 19.5* -- 21.8* --  INR 1.66* 1.71* -- 1.99* --  HEPARINUNFRC 0.35 0.35 0.13* -- --  CREATININE -- 3.02* -- 3.05* 2.99*  CKTOTAL -- -- -- -- --  CKMB -- -- -- -- --  TROPONINI -- -- -- -- --    Estimated Creatinine Clearance: 20.8 ml/min (by C-G formula based on Cr of 3.02).   Medications:  Infusions:     . heparin 1,500 Units/hr (10/11/12 2110)  . [DISCONTINUED] heparin 1,300 Units/hr (10/11/12 1047)    Assessment: 78 y/o male with a hx Afib on chronic Coumadin that is on hold for HD access placement on 12/20. Heparin infusing as bridge for INR <2. Heparin level is therapeutic x2 on 1500 units/hr. INR is 1.66. No bleeding noted, CBC is stable.  Goal of Therapy:  Heparin level 0.3-0.7 units/ml Monitor platelets by anticoagulation protocol: Yes   Plan:  -Continue heparin drip at 1500 units/hr -Daily heparin level and CBC while on heparin -Follow-up after HD access placement tomorrow  [facility], Pharm.D., BCPS Clinical Pharmacist Pager: [phone] 10/12/2012 1:40 PM

## 2012-10-12 NOTE — Progress Notes (Signed)
Saw plan for HD access tomorrow.  Will follow pt to see how he does in txs after HD, to see if he needs CIR.  [phone]

## 2012-10-12 NOTE — Progress Notes (Signed)
Subjective:  Pt not sure of plan Per Darel Hong at [facility] - Access surgery will be on 12/20 (Diatek and AVF or AVG) No SOB today "I am still all swollen"  Objective Vital signs in last 24 hours: Filed Vitals:   10/11/12 1113 10/11/12 1300 10/11/12 2032 10/12/12 0456  BP: 129/59 106/51 111/56 108/61  Pulse: 64 58 60 60  Temp: 98.3 F (36.8 C) 97.8 F (36.6 C) 98.2 F (36.8 C) 98.4 F (36.9 C)  TempSrc: Oral Oral Oral Oral  Resp: 20 20 19 20   Height:      Weight:    87.5 kg (192 lb 14.4 oz)  SpO2: 95% 98% 95% 97%   Weight change: -0.1 kg (-3.5 oz)  Intake/Output Summary (Last 24 hours) at 10/12/12 1016 Last data filed at 10/12/12 0950  Gross per 24 hour  Intake    480 ml  Output   3050 ml  Net  -2570 ml   Physical Exam:  Blood pressure 108/61, pulse 60, temperature 98.4 F (36.9 C), temperature source Oral, resp. rate 20, height 5\' 11"  (1.803 m), weight 87.5 kg (192 lb 14.4 oz), SpO2 97.00%. WEIGHTS:  10/12/12 87.5 kg 10/11/12 87.6 kg 10/10/12 90.67 kg 10/09/12 0459 90.9 kg  10/08/12 0519 91.6 kg  10/07/12 0512 92.4 kg 10/06/12 0500 91.8 kg  10/05/12 0520 90.6 kg   10/04/12 0543 90.9 kg 10/03/12 0453 90.538 kg 10/02/12 0535 89.812 kg 10/01/12 0557 90.3 kg   09/30/12 0455 90.3 kg 09/29/12 0535 89.4 kg   09/28/12 1540 90.2 kg   Remains with generalized anasarca with abdominal wall, sacral edema  JVD to jaw angle S1S2 no S3 Wide split S2 2/6 murmur left sternal border to apex Breath sounds diminished  Abdomen without focal tenderness  2-3+ pretibial edema, diffuse body wall edema, left arm edema 4+  Pacer left chest wall  Labs: Basic Metabolic Panel:  Lab 10/12/12 8119 10/11/12 0540 10/10/12 0550 10/09/12 0455 10/07/12 0530 10/06/12 1451  NA 145 144 142 143 142 141  K 3.2* 3.5 3.3* 3.6 3.0* 3.4*  CL 108 108 105 106 105 104  CO2 28 25 27 27 27 27   GLUCOSE 87 115* 109* 89 80 117*  BUN 68* 70* 71* 69* 60* 57*  CREATININE 3.02* 3.05* 2.99* 3.17* 2.89* 2.77*  ALB  -- -- -- -- -- --  CALCIUM 8.7 8.5 8.4 8.2* 8.2* 8.4  PHOS 1.7* 1.8* 2.3 -- 2.2* 2.3   Liver Function Tests:  Lab 10/12/12 0413 10/11/12 0540 10/10/12 0550  AST -- -- --  ALT -- -- --  ALKPHOS -- -- --  BILITOT -- -- --  PROT -- -- --  ALBUMIN 2.2* 2.0* 2.1*  CBC:  Lab 10/12/12 0413 10/11/12 0540 10/10/12 0550 10/09/12 0455  WBC 5.8 5.8 5.0 5.7  NEUTROABS -- -- -- --  HGB 9.9* 9.6* 9.5* 9.2*  HCT 30.4* 29.1* 29.2* 27.1*  MCV 88.1 86.6 88.5 86.9  PLT 207 177 175 161   Lab Results  Component Value Date   INR 1.71* 10/12/2012   INR 1.99* 10/11/2012   INR 2.08* 10/10/2012   Medications:    . heparin 1,500 Units/hr (10/11/12 2110)      . allopurinol  100 mg Oral Daily  . atorvastatin  40 mg Oral Daily  . darbepoetin (ARANESP) injection - NON-DIALYSIS  60 mcg Subcutaneous Q Wed-1800  . digoxin  0.125 mg Oral Daily  . furosemide  160 mg Intravenous TID  . isosorbide-hydrALAZINE  1 tablet  Oral TID  . levothyroxine  25 mcg Oral QAC breakfast  . multivitamin with minerals  1 tablet Oral Daily  . potassium chloride  40 mEq Oral BID  . potassium chloride  40 mEq Oral Once  . sodium chloride  3 mL Intravenous Q12H  . traZODone  150 mg Oral QHS   I  have reviewed scheduled and prn medications.  ASSESSMENT/RECOMMENDATIONS   78 year old BM with baseline CKD 4 (2.8 in September/peak 4 in December) with refractory heart failure/edema/cardiorenal picture and lack of response to diuretics, decompensated systolic heart failure, coronary artery disease history of anteroseptal wall MI in the past , ischemic dilated cardiomyopathy status post biV ICD, atrial fibrillation) anemia, hypothyroidism)   1. Acute kidney injury on CKD4  Will be ESRD   Despite creatinine around 3 has lost only 5-6 lbs since Dec 5 on max diuretics (and prev dobutamine)   Still on IV lasix pending access for dialysis (Will stop once HD starts)  Has gross anasarca   Needs long term dialysis for cardiorenal  syndrome - will be end stage renal disease (secondary to HTN plus cardiorenal syndrome)   Will need right sided permcath and AVF/AVG (probable left SCV stenosis plus thrombosed cephalic branch on v map))   INR still too high for access procedure today  Once access in will start HD  Per [facility] access to be placed 12/20  Have started outpt placement process for HD [facility] second shift)  Give extra potassium today 2. Cardiomyopathy with low BP  3. Vol overload remains significant  4. 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 Anemia   Started Darbepoetin (60/d dosed 12/18 X 1) Change to QTues with HD  Can start with weekly iron with HD once starts  (QTues with HD) 6. CKD-MBD   PTH 99.8  Phos normal   No binders or Vitamin D needed at this time  Camille Bal, MD [facility] [phone] pager 10/12/2012, 10:16 AM

## 2012-10-12 NOTE — Progress Notes (Signed)
Chart review complete.  Patient is not eligible for [facility] Care Management services because his/her PCP is not a [facility] primary care provider.  For any additional questions or new referrals please contact Anibal Henderson BSN RN [facility] Liaison at [phone]

## 2012-10-12 NOTE — Progress Notes (Signed)
Subjective:  Patient denies any chest pain or shortness of breath. Tentatively scheduled for AV graft today. INR is trending down. Will repeat the INR hopefully it will be below 1.6  Objective:  Vital Signs in the last 24 hours: Temp:  [97.8 F (36.6 C)-98.4 F (36.9 C)] 98.4 F (36.9 C) (12/19 0456) Pulse Rate:  [58-64] 60  (12/19 0456) Resp:  [19-20] 20  (12/19 0456) BP: (106-129)/(51-61) 108/61 mmHg (12/19 0456) SpO2:  [95 %-98 %] 97 % (12/19 0456) Weight:  [87.5 kg (192 lb 14.4 oz)] 87.5 kg (192 lb 14.4 oz) (12/19 0456)  Intake/Output from previous day: 12/18 0701 - 12/19 0700 In: 480 [P.O.:480] Out: 2450 [Urine:2450] Intake/Output from this shift:    Physical Exam: Neck: no adenopathy, no carotid bruit, no JVD and supple, symmetrical, trachea midline Lungs: Decreased breath sound at bases Heart: regular rate and rhythm, S1, S2 normal and Soft systolic murmur noted Abdomen: soft, non-tender; bowel sounds normal; no masses,  no organomegaly Extremities: No clubbing cyanosis 1+ edema noted. Small ulcers noted right leg  Lab Results:  Basename 10/12/12 0413 10/11/12 0540  WBC 5.8 5.8  HGB 9.9* 9.6*  PLT 207 177    Basename 10/12/12 0413 10/11/12 0540  NA 145 144  K 3.2* 3.5  CL 108 108  CO2 28 25  GLUCOSE 87 115*  BUN 68* 70*  CREATININE 3.02* 3.05*   No results found for this basename: TROPONINI:2,CK,MB:2 in the last 72 hours Hepatic Function Panel  Basename 10/12/12 0413  PROT --  ALBUMIN 2.2*  AST --  ALT --  ALKPHOS --  BILITOT --  BILIDIR --  IBILI --   No results found for this basename: CHOL in the last 72 hours No results found for this basename: PROTIME in the last 72 hours  Imaging: Imaging results have been reviewed and No results found.  Cardiac Studies:  Assessment/Plan:  Resolving decompensated systolic heart failure  Coronary artery disease history of anteroseptal wall MI in the past  Ischemic dilated cardiomyopathy status post by  V. ICD  Atrial fibrillation  Chronic kidney disease stage IV  Anemia  Hypothyroidism  Hyperalbuminemia  Chronic right leg ulcers healing Mild volume overload Hypokalemia Plan Continue present management Switch Lasix to by mouth per renal service. Recheck PT INR Replace K.  LOS: 14 days    [name] N 10/12/2012, 9:29 AM

## 2012-10-13 ENCOUNTER — Other Ambulatory Visit: Payer: Self-pay | Admitting: *Deleted

## 2012-10-13 ENCOUNTER — Inpatient Hospital Stay (HOSPITAL_COMMUNITY): Payer: Medicare Other

## 2012-10-13 ENCOUNTER — Encounter (HOSPITAL_COMMUNITY): Payer: Self-pay | Admitting: Anesthesiology

## 2012-10-13 ENCOUNTER — Telehealth: Payer: Self-pay | Admitting: Vascular Surgery

## 2012-10-13 ENCOUNTER — Inpatient Hospital Stay (HOSPITAL_COMMUNITY): Payer: Medicare Other | Admitting: Anesthesiology

## 2012-10-13 ENCOUNTER — Encounter (HOSPITAL_COMMUNITY)
Admission: AD | Disposition: A | Discharge status: Skilled Nursing Facility | Payer: Self-pay | Source: Ambulatory Visit | Attending: Cardiovascular Disease

## 2012-10-13 DIAGNOSIS — N186 End stage renal disease: Secondary | ICD-10-CM

## 2012-10-13 DIAGNOSIS — Z4931 Encounter for adequacy testing for hemodialysis: Secondary | ICD-10-CM

## 2012-10-13 HISTORY — PX: INSERTION OF DIALYSIS CATHETER: SHX1324

## 2012-10-13 HISTORY — PX: AV FISTULA PLACEMENT: SHX1204

## 2012-10-13 LAB — HEPATITIS B SURFACE ANTIGEN: Hepatitis B Surface Ag: NEGATIVE

## 2012-10-13 LAB — RENAL FUNCTION PANEL
CO2: 28 mEq/L (ref 19–32)
Chloride: 108 mEq/L (ref 96–112)
GFR calc Af Amer: 23 mL/min — ABNORMAL LOW (ref >90)
GFR calc non Af Amer: 20 mL/min — ABNORMAL LOW (ref >90)
Glucose, Bld: 87 mg/dL (ref 70–99)
Potassium: 3.5 mEq/L (ref 3.5–5.1)
Sodium: 147 mEq/L — ABNORMAL HIGH (ref 135–145)

## 2012-10-13 LAB — PROTIME-INR
INR: 1.48 (ref 0.00–1.49)
Prothrombin Time: 17.5 seconds — ABNORMAL HIGH (ref 11.6–15.2)

## 2012-10-13 LAB — CBC
HCT: 34.1 % — ABNORMAL LOW (ref 39.0–52.0)
Hemoglobin: 11 g/dL — ABNORMAL LOW (ref 13.0–17.0)
MCHC: 32.3 g/dL (ref 30.0–36.0)
RBC: 3.83 MIL/uL — ABNORMAL LOW (ref 4.22–5.81)

## 2012-10-13 LAB — HEPARIN LEVEL (UNFRACTIONATED)
Heparin Unfractionated: 0.2 IU/mL — ABNORMAL LOW (ref 0.30–0.70)
Heparin Unfractionated: 0.29 IU/mL — ABNORMAL LOW (ref 0.30–0.70)

## 2012-10-13 SURGERY — ARTERIOVENOUS (AV) FISTULA CREATION
Anesthesia: Monitor Anesthesia Care | Site: Arm Lower | Laterality: Right | Wound class: Clean

## 2012-10-13 MED ORDER — SODIUM GLYCEROPHOSPHATE 1 MMOLE/ML IV SOLN
10.0000 mmol | Freq: Once | INTRAVENOUS | Status: DC
  Filled 2012-10-13: qty 10

## 2012-10-13 MED ORDER — PROPOFOL INFUSION 10 MG/ML OPTIME
INTRAVENOUS | Status: DC | PRN
  Administered 2012-10-13: 75 ug/kg/min via INTRAVENOUS

## 2012-10-13 MED ORDER — HEPARIN (PORCINE) IN NACL 100-0.45 UNIT/ML-% IJ SOLN
1750.0000 [IU]/h | INTRAMUSCULAR | Status: DC
  Administered 2012-10-14: 1800 [IU]/h via INTRAVENOUS
  Filled 2012-10-13 (×3): qty 250

## 2012-10-13 MED ORDER — DARBEPOETIN ALFA-POLYSORBATE 60 MCG/0.3ML IJ SOLN
INTRAMUSCULAR | Status: AC
  Filled 2012-10-13: qty 0.3

## 2012-10-13 MED ORDER — WARFARIN SODIUM 5 MG PO TABS
5.0000 mg | ORAL_TABLET | Freq: Once | ORAL | Status: AC
  Administered 2012-10-13: 5 mg via ORAL
  Filled 2012-10-13 (×2): qty 1

## 2012-10-13 MED ORDER — DEXTROSE 5 % IV SOLN
INTRAVENOUS | Status: AC
  Filled 2012-10-13: qty 50

## 2012-10-13 MED ORDER — HEPARIN SODIUM (PORCINE) 1000 UNIT/ML IJ SOLN
INTRAMUSCULAR | Status: DC | PRN
  Administered 2012-10-13: 4.6 mL

## 2012-10-13 MED ORDER — LIDOCAINE HCL (PF) 1 % IJ SOLN
INTRAMUSCULAR | Status: AC
  Filled 2012-10-13: qty 30

## 2012-10-13 MED ORDER — WARFARIN - PHARMACIST DOSING INPATIENT: Freq: Every day | Status: DC

## 2012-10-13 MED ORDER — SODIUM CHLORIDE 0.9 % IR SOLN: Status: DC | PRN

## 2012-10-13 MED ORDER — HEPARIN SODIUM (PORCINE) 1000 UNIT/ML IJ SOLN
INTRAMUSCULAR | Status: AC
  Filled 2012-10-13: qty 1

## 2012-10-13 MED ORDER — LIDOCAINE-EPINEPHRINE 0.5 %-1:200000 IJ SOLN
INTRAMUSCULAR | Status: AC
  Filled 2012-10-13: qty 1

## 2012-10-13 MED ORDER — FENTANYL CITRATE 0.05 MG/ML IJ SOLN
INTRAMUSCULAR | Status: DC | PRN
  Administered 2012-10-13 (×2): 25 ug via INTRAVENOUS

## 2012-10-13 MED ORDER — ONDANSETRON HCL 4 MG/2ML IJ SOLN: 4.0000 mg | Freq: Once | INTRAMUSCULAR | Status: DC | PRN

## 2012-10-13 MED ORDER — SODIUM CHLORIDE 0.9 % IV SOLN
1.5000 g | INTRAVENOUS | Status: DC | PRN
  Administered 2012-10-13: 1.5 g via INTRAVENOUS

## 2012-10-13 MED ORDER — LIDOCAINE-EPINEPHRINE (PF) 1 %-1:200000 IJ SOLN
INTRAMUSCULAR | Status: DC | PRN
  Administered 2012-10-13: 30 mL

## 2012-10-13 MED ORDER — SODIUM GLYCEROPHOSPHATE 1 MMOLE/ML IV SOLN
10.0000 mmol | Freq: Once | INTRAVENOUS | Status: AC
  Administered 2012-10-13: 10 mmol via INTRAVENOUS
  Filled 2012-10-13: qty 10

## 2012-10-13 MED ORDER — 0.9 % SODIUM CHLORIDE (POUR BTL) OPTIME
TOPICAL | Status: DC | PRN
  Administered 2012-10-13: 1000 mL

## 2012-10-13 MED ORDER — OXYCODONE HCL 5 MG PO TABS: 5.0000 mg | ORAL_TABLET | ORAL | Status: DC | PRN

## 2012-10-13 MED ORDER — ALTEPLASE 100 MG IV SOLR
4.0000 mg | Freq: Once | INTRAVENOUS | Status: AC
  Administered 2012-10-13: 4 mg
  Filled 2012-10-13: qty 4

## 2012-10-13 MED ORDER — CEFUROXIME SODIUM 1.5 G IJ SOLR
INTRAMUSCULAR | Status: AC
  Filled 2012-10-13: qty 1.5

## 2012-10-13 MED ORDER — SODIUM PHOSPHATE 3 MMOLE/ML IV SOLN: 10.0000 mmol | Freq: Once | INTRAVENOUS | Status: DC

## 2012-10-13 MED ORDER — LIDOCAINE-EPINEPHRINE (PF) 1 %-1:200000 IJ SOLN
INTRAMUSCULAR | Status: AC
  Filled 2012-10-13: qty 10

## 2012-10-13 MED ORDER — SODIUM CHLORIDE 0.9 % IR SOLN
Status: DC | PRN
  Administered 2012-10-13: 11:00:00

## 2012-10-13 MED ORDER — HYDROMORPHONE HCL PF 1 MG/ML IJ SOLN: 0.2500 mg | INTRAMUSCULAR | Status: DC | PRN

## 2012-10-13 SURGICAL SUPPLY — 64 items
ADH SKN CLS APL DERMABOND .7 (GAUZE/BANDAGES/DRESSINGS) ×4
BAG DECANTER FOR FLEXI CONT (MISCELLANEOUS) ×3 IMPLANT
CANISTER SUCTION 2500CC (MISCELLANEOUS) ×3 IMPLANT
CATH CANNON HEMO 15F 50CM (CATHETERS) IMPLANT
CATH CANNON HEMO 15FR 19 (HEMODIALYSIS SUPPLIES) IMPLANT
CATH CANNON HEMO 15FR 23CM (HEMODIALYSIS SUPPLIES) ×1 IMPLANT
CATH CANNON HEMO 15FR 31CM (HEMODIALYSIS SUPPLIES) IMPLANT
CATH CANNON HEMO 15FR 32 (HEMODIALYSIS SUPPLIES) IMPLANT
CATH CANNON HEMO 15FR 32CM (HEMODIALYSIS SUPPLIES) IMPLANT
CLIP TI MEDIUM 6 (CLIP) ×3 IMPLANT
CLIP TI WIDE RED SMALL 6 (CLIP) ×4 IMPLANT
CLOTH BEACON ORANGE TIMEOUT ST (SAFETY) ×3 IMPLANT
COVER PROBE W GEL 5X96 (DRAPES) IMPLANT
COVER SURGICAL LIGHT HANDLE (MISCELLANEOUS) ×3 IMPLANT
DECANTER SPIKE VIAL GLASS SM (MISCELLANEOUS) ×3 IMPLANT
DERMABOND ADVANCED (GAUZE/BANDAGES/DRESSINGS) ×2
DERMABOND ADVANCED .7 DNX12 (GAUZE/BANDAGES/DRESSINGS) ×2 IMPLANT
DRAPE C-ARM 42X72 X-RAY (DRAPES) ×3 IMPLANT
DRAPE CHEST BREAST 15X10 FENES (DRAPES) ×3 IMPLANT
ELECT REM PT RETURN 9FT ADLT (ELECTROSURGICAL) ×3
ELECTRODE REM PT RTRN 9FT ADLT (ELECTROSURGICAL) ×2 IMPLANT
GAUZE SPONGE 2X2 8PLY STRL LF (GAUZE/BANDAGES/DRESSINGS) ×2 IMPLANT
GAUZE SPONGE 4X4 16PLY XRAY LF (GAUZE/BANDAGES/DRESSINGS) ×3 IMPLANT
GEL ULTRASOUND 20GR AQUASONIC (MISCELLANEOUS) ×1 IMPLANT
GLOVE BIOGEL PI IND STRL 6.5 (GLOVE) IMPLANT
GLOVE BIOGEL PI IND STRL 7.5 (GLOVE) IMPLANT
GLOVE BIOGEL PI INDICATOR 6.5 (GLOVE) ×2
GLOVE BIOGEL PI INDICATOR 7.5 (GLOVE) ×3
GLOVE SS BIOGEL STRL SZ 6.5 (GLOVE) IMPLANT
GLOVE SS BIOGEL STRL SZ 7 (GLOVE) ×2 IMPLANT
GLOVE SUPERSENSE BIOGEL SZ 6.5 (GLOVE) ×2
GLOVE SUPERSENSE BIOGEL SZ 7 (GLOVE) ×2
GLOVE SURG SS PI 7.5 STRL IVOR (GLOVE) ×3 IMPLANT
GOWN STRL NON-REIN LRG LVL3 (GOWN DISPOSABLE) ×9 IMPLANT
KIT BASIN OR (CUSTOM PROCEDURE TRAY) ×3 IMPLANT
KIT ROOM TURNOVER OR (KITS) ×3 IMPLANT
NDL 18GX1X1/2 (RX/OR ONLY) (NEEDLE) ×2 IMPLANT
NDL HYPO 25GX1X1/2 BEV (NEEDLE) ×2 IMPLANT
NEEDLE 18GX1X1/2 (RX/OR ONLY) (NEEDLE) ×3 IMPLANT
NEEDLE 22X1 1/2 (OR ONLY) (NEEDLE) ×3 IMPLANT
NEEDLE HYPO 25GX1X1/2 BEV (NEEDLE) ×3 IMPLANT
NS IRRIG 1000ML POUR BTL (IV SOLUTION) ×3 IMPLANT
PACK CV ACCESS (CUSTOM PROCEDURE TRAY) ×3 IMPLANT
PACK SURGICAL SETUP 50X90 (CUSTOM PROCEDURE TRAY) ×3 IMPLANT
PAD ARMBOARD 7.5X6 YLW CONV (MISCELLANEOUS) ×6 IMPLANT
PAD ELECT DEFIB RADIOL ZOLL (MISCELLANEOUS) ×1 IMPLANT
SOAP 2 % CHG 4 OZ (WOUND CARE) ×3 IMPLANT
SPONGE GAUZE 2X2 STER 10/PKG (GAUZE/BANDAGES/DRESSINGS) ×1
SPONGE GAUZE 4X4 12PLY (GAUZE/BANDAGES/DRESSINGS) ×3 IMPLANT
SUT ETHILON 3 0 PS 1 (SUTURE) ×3 IMPLANT
SUT PROLENE 6 0 BV (SUTURE) ×3 IMPLANT
SUT VIC AB 3-0 SH 27 (SUTURE) ×3
SUT VIC AB 3-0 SH 27X BRD (SUTURE) ×2 IMPLANT
SUT VICRYL 4-0 PS2 18IN ABS (SUTURE) ×3 IMPLANT
SYR 20CC LL (SYRINGE) ×3 IMPLANT
SYR 30ML LL (SYRINGE) IMPLANT
SYR 5ML LL (SYRINGE) ×6 IMPLANT
SYR CONTROL 10ML LL (SYRINGE) ×3 IMPLANT
SYRINGE 10CC LL (SYRINGE) ×3 IMPLANT
TAPE CLOTH SURG 4X10 WHT LF (GAUZE/BANDAGES/DRESSINGS) ×1 IMPLANT
TOWEL OR 17X24 6PK STRL BLUE (TOWEL DISPOSABLE) ×3 IMPLANT
TOWEL OR 17X26 10 PK STRL BLUE (TOWEL DISPOSABLE) ×4 IMPLANT
UNDERPAD 30X30 INCONTINENT (UNDERPADS AND DIAPERS) ×4 IMPLANT
WATER STERILE IRR 1000ML POUR (IV SOLUTION) ×3 IMPLANT

## 2012-10-13 NOTE — Telephone Encounter (Addendum)
Message copied by Shari Prows on Fri Oct 13, 2012  2:22 PM ------      Message from: Sharee Pimple      Created: Fri Oct 13, 2012 12:34 PM      Regarding: schedule                   ----- Message -----         From: Dara Lords, PA         Sent: 10/13/2012  12:08 PM           To: Sharee Pimple, CMA            S/p right radiocephalic AVF by JDL on 10/13/12.  F/u with him in 6 weeks.            Thanks,      Lelon Mast  I scheduled for the above patient on 11/21/12 at 1:15pm. I mailed an appt letter and also lm for the patient on his home phone/awt

## 2012-10-13 NOTE — Preoperative (Signed)
Beta Blockers   Reason not to administer Beta Blockers:Concurrent use of intravenous inotropic medications during the perioperative 

## 2012-10-13 NOTE — Anesthesia Preprocedure Evaluation (Addendum)
Anesthesia Evaluation  Patient identified by MRN, date of birth, ID band Patient awake    Reviewed: Allergy & Precautions, H&P , NPO status , Patient's Chart, lab work & pertinent test results  History of Anesthesia Complications Negative for: history of anesthetic complications  Airway Mallampati: I TM Distance: >3 FB Neck ROM: full    Dental  (+) Dental Advisory Given and Poor Dentition   Pulmonary          Cardiovascular hypertension, Pt. on medications + CAD, + Past MI and +CHF + dysrhythmias Atrial Fibrillation + Cardiac Defibrillator Rhythm:irregular Rate:Normal     Neuro/Psych Depression Stroke 2013- left sided weakness    GI/Hepatic   Endo/Other    Renal/GU ESRFRenal disease     Musculoskeletal   Abdominal   Peds  Hematology   Anesthesia Other Findings   Reproductive/Obstetrics                          Anesthesia Physical Anesthesia Plan  ASA: III  Anesthesia Plan: MAC   Post-op Pain Management:    Induction: Intravenous  Airway Management Planned: Mask  Additional Equipment:   Intra-op Plan:   Post-operative Plan:   Informed Consent: I have reviewed the patients History and Physical, chart, labs and discussed the procedure including the risks, benefits and alternatives for the proposed anesthesia with the patient or authorized representative who has indicated his/her understanding and acceptance.     Plan Discussed with: CRNA, Anesthesiologist and Surgeon  Anesthesia Plan Comments:         Anesthesia Quick Evaluation

## 2012-10-13 NOTE — Progress Notes (Signed)
ANTICOAGULATION CONSULT NOTE - Follow Up Consult  Pharmacy Consult for heparin Indication: atrial fibrillation  Labs:  Basename 10/13/12 0600 10/12/12 1220 10/12/12 0413 10/11/12 0540  HGB 11.0* -- 9.9* --  HCT 34.1* -- 30.4* 29.1*  PLT 230 -- 207 177  APTT -- -- -- --  LABPROT 17.5* 19.1* 19.5* --  INR 1.48 1.66* 1.71* --  HEPARINUNFRC 0.20* 0.35 0.35 --  CREATININE -- -- 3.02* 3.05*  CKTOTAL -- -- -- --  CKMB -- -- -- --  TROPONINI -- -- -- --    Assessment: 78yo male now subtherapeutic on heparin after two levels at low end of goal, plan for OR today.  Goal of Therapy:  Heparin level 0.3-0.7 units/ml   Plan:  Will increase heparin gtt by 2 units/kg/hr to 1700 units/hr and f/u after OR.  Colleen Can PharmD BCPS 10/13/2012,7:35 AM

## 2012-10-13 NOTE — Anesthesia Postprocedure Evaluation (Signed)
  Anesthesia Post-op Note  Patient: [name]  Procedure(s) Performed: Procedure(s) (LRB) with comments: ARTERIOVENOUS (AV) FISTULA CREATION (Right) - Creation right arm arteriovenous fistula  INSERTION OF DIALYSIS CATHETER (N/A) - right internal jugular vein  Patient Location: PACU  Anesthesia Type:MAC  Level of Consciousness: awake, alert , oriented and patient cooperative  Airway and Oxygen Therapy: Patient Spontanous Breathing  Post-op Pain: mild  Post-op Assessment: Post-op Vital signs reviewed, Patient's Cardiovascular Status Stable, Respiratory Function Stable, Patent Airway, No signs of Nausea or vomiting and Pain level controlled  Post-op Vital Signs: stable  Complications: No apparent anesthesia complications

## 2012-10-13 NOTE — Progress Notes (Addendum)
Hemodialysis- HD performed post cath placement. Pt received 1hour 50 minutes of treatment before venous pressure increased to 350. Both lumens flushed with saline with no results, venous port will not aspirate or push. Pt rinsed back to avoid blood loss. Total UF 1.2L d/t low bfr and flushing system. BP drop x1 to 89/50.  Dr. Eliott Nine notified of issue. Order to TPA overnight and bring pt back first shift tomorrow morning. Done. VS stable.

## 2012-10-13 NOTE — Progress Notes (Addendum)
ANTICOAGULATION CONSULT NOTE - Follow Up Consult  Pharmacy Consult for heparin Indication: atrial fibrillation  Labs:  Basename 10/13/12 0600 10/12/12 1220 10/12/12 0413 10/11/12 0540  HGB 11.0* -- 9.9* --  HCT 34.1* -- 30.4* 29.1*  PLT 230 -- 207 177  APTT -- -- -- --  LABPROT 17.5* 19.1* 19.5* --  INR 1.48 1.66* 1.71* --  HEPARINUNFRC 0.20* 0.35 0.35 --  CREATININE 2.83* -- 3.02* 3.05*  CKTOTAL -- -- -- --  CKMB -- -- -- --  TROPONINI -- -- -- --    Assessment: 77yo male now subtherapeutic on heparin this morning. Orders to resume heparin post-op at 1700/hr (adjusted this am) will recheck level this evening. No overt bleeding complications post-op.  Goal of Therapy:  Heparin level 0.3-0.7 units/ml   Plan:  Restart IV heparin at 1700/hr HL at 2300  [name] PharmD BCPS 10/13/2012,2:43 PM   Addendum: 82yom s/p PermCath and AVF. Heparin was restarted post op as above. Orders now to restart Coumadin if okay by surgery - discussed with Dr. Imogene Burn, okay to restart tonight. Patient had been therapeutic on Coumadin 3mg  daily prior in admission but has received Vitamin K 5mg  each of the last 3 days. Will restart with higher dose to help overcome INR reversal.  Goal INR: 2-3  Plan: 1. Coumadin 5mg  po x 1 today 2. Daily INR  [name], [name], PharmD [phone] 10/13/2012, 6:39 PM

## 2012-10-13 NOTE — Progress Notes (Signed)
Subjective:  Just back from permcath and AVF Feels good No pain in arm "Ready to go to dialysis"  Objective Vital signs in last 24 hours: Filed Vitals:   10/13/12 1345 10/13/12 1350 10/13/12 1405 10/13/12 1427  BP:  179/63 174/57 169/69  Pulse: 59 59 60 60  Temp:   97.8 F (36.6 C) 97.9 F (36.6 C)  TempSrc:    Oral  Resp: 21 12 13 16   Height:      Weight:      SpO2: 96% 98% 99%    Weight change: -2.541 kg (-5 lb 9.6 oz)  Intake/Output Summary (Last 24 hours) at 10/13/12 1526 Last data filed at 10/13/12 1455  Gross per 24 hour  Intake 1644.75 ml  Output   3425 ml  Net -1780.25 ml  WEIGHTS:  10/13/12 84.9 10/12/12 87.5 kg  10/11/12 87.6 kg  10/10/12 90.67 kg  10/09/12 0459 90.9 kg  10/08/12 0519 91.6 kg  10/07/12 0512 92.4 kg  10/06/12 0500 91.8 kg  10/05/12 0520 90.6 kg  10/04/12 0543 90.9 kg  10/03/12 0453 90.538 kg  10/02/12 0535 89.812 kg  10/01/12 0557 90.3 kg  09/30/12 0455 90.3 kg  09/29/12 0535 89.4 kg  09/28/12 1540 90.2 kg   Physical Exam:   Blood pressure 169/69, pulse 60, temperature 97.9 F (36.6 C), temperature source Oral, resp. rate 16, height 5\' 11"  (1.803 m), weight 84.959 kg (187 lb 4.8 oz), SpO2 99.00%. Very pleasant BM JVP 6 cm Diminished breath sounds Abdominal wall edema/2+ LE edema New right AVF wrist incision glued Good bruit along the forearm Right sided perm cath with dressing in place  Labs: Basic Metabolic Panel:  Lab 10/13/12 1610 10/12/12 0413 10/11/12 0540 10/10/12 0550 10/09/12 0455 10/07/12 0530  NA 147* 145 144 142 143 142  K 3.5 3.2* 3.5 3.3* 3.6 3.0*  CL 108 108 108 105 106 105  CO2 28 28 25 27 27 27   GLUCOSE 87 87 115* 109* 89 80  BUN 62* 68* 70* 71* 69* 60*  CREATININE 2.83* 3.02* 3.05* 2.99* 3.17* 2.89*  ALB -- -- -- -- -- --  CALCIUM 9.1 8.7 8.5 8.4 8.2* 8.2*  PHOS 1.5* 1.7* 1.8* 2.3 -- 2.2*   Liver Function Tests:  Lab 10/13/12 0600 10/12/12 0413 10/11/12 0540  AST -- -- --  ALT 66* -- --  ALKPHOS  -- -- --  BILITOT -- -- --  PROT -- -- --  ALBUMIN 2.3* 2.2* 2.0*  CBC:  Lab 10/13/12 0600 10/12/12 0413 10/11/12 0540 10/10/12 0550  WBC 7.9 5.8 5.8 5.0  NEUTROABS -- -- -- --  HGB 11.0* 9.9* 9.6* 9.5*  HCT 34.1* 30.4* 29.1* 29.2*  MCV 89.0 88.1 86.6 88.5  PLT 230 207 177 175  Cardiac Enzymes: No results found for this basename: CKTOTAL:5,CKMB:5,CKMBINDEX:5,TROPONINI:5 in the last 168 hours CBG: No results found for this basename: GLUCAP:5 in the last 168 hours  Iron Studies: No results found for this basename: IRON:30,TIBC:30,TRANSFERRIN:30,FERRITIN:30 in the last 168 hours Studies/Results: Dg Chest Port 1 View  10/13/2012  *RADIOLOGY REPORT*  Clinical Data: Status post diatek catheter placement.  PORTABLE CHEST - 1 VIEW  Comparison: Two-view chest 10/07/2012.  Findings: The right IJ dialysis catheter is in place.  The short limb is at the cavoatrial junction.  The distal limb is in the right atrium.  Pacing defibrillator wires are stable.  A defibrillator pad is in place.  The left hemidiaphragm is elevated. Moderate pulmonary vascular congestion has increased slightly.  IMPRESSION:  1.  Interval placement of right IJ dialysis catheter without radiographically evident complication. 2. Cardiomegaly with increasing pulmonary vascular congestion compatible with congestive heart failure.   Original Report Authenticated By: Marin Roberts, M.D.    Medications:    . heparin 1,700 Units/hr (10/13/12 1440)      . allopurinol  100 mg Oral Daily  . atorvastatin  40 mg Oral Daily  . darbepoetin (ARANESP) injection - DIALYSIS  60 mcg Intravenous Q Tue-HD  . digoxin  0.125 mg Oral Daily  . ferric gluconate (FERRLECIT/NULECIT) IV  125 mg Intravenous Q Tue-HD  . furosemide  160 mg Intravenous TID  . isosorbide-hydrALAZINE  1 tablet Oral TID  . levothyroxine  25 mcg Oral QAC breakfast  . multivitamin with minerals  1 tablet Oral Daily  . potassium chloride  40 mEq Oral TID  . sodium  chloride  3 mL Intravenous Q12H  . traZODone  150 mg Oral QHS    I  have reviewed scheduled and prn medications.  ASSESSMENT/RECOMMENDATIONS  78 year old BM with baseline CKD 4 (2.8 in September/peak 4 in December)  with refractory heart failure/edema/cardiorenal picture and lack of response to diuretics ( decompensated systolic heart failure, coronary artery disease history of anteroseptal wall MI in the past , ischemic dilated cardiomyopathy status post by V. ICD, atrial fibrillation) anemia, hypothyroidism)   1. Acute kidney injury on CKD4 - will be ESRD   Creatinine around 3 but poor overall response to diuretics  Still on IV - stop now that HD started  Has  anasarca   Needs long term dialysis for cardiorenal syndrome - will be end stage renal disease (secondary to HTN plus cardiorenal syndrome)   S/p right sided permcath and right lower arm AVF  Stop potassium now that stopping lasix  Start HD today and again tomorrow  Have started outpt placement process for HD (should CLIP to [facility] - lives in [place] area)  2. Cardiomyopathy with low BP  3. Vol overload remains significant  4. 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 Anemia start Darbepoetin (60/d dosed 12/18 X 1)   can start with weekly iron with HD once starts  6. 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

## 2012-10-13 NOTE — Progress Notes (Signed)
ANTICOAGULATION CONSULT NOTE - Follow Up Consult  Pharmacy Consult for heparin Indication: atrial fibrillation  Labs:  Basename 10/13/12 2225 10/13/12 0600 10/12/12 1220 10/12/12 0413 10/11/12 0540  HGB -- 11.0* -- 9.9* --  HCT -- 34.1* -- 30.4* 29.1*  PLT -- 230 -- 207 177  APTT -- -- -- -- --  LABPROT -- 17.5* 19.1* 19.5* --  INR -- 1.48 1.66* 1.71* --  HEPARINUNFRC 0.29* 0.20* 0.35 -- --  CREATININE -- 2.83* -- 3.02* 3.05*  CKTOTAL -- -- -- -- --  CKMB -- -- -- -- --  TROPONINI -- -- -- -- --    Assessment: 78yo male slightly subtherapeutic on heparin after resumed post-op.  Goal of Therapy:  Heparin level 0.3-0.7 units/ml   Plan:  Will increase heparin gtt by 1 unit/kg/hr to 1800 units/hr and check level in 8hr.  Colleen Can PharmD BCPS 10/13/2012,11:13 PM

## 2012-10-13 NOTE — Progress Notes (Signed)
OT Cancellation Note  Patient Details Name: [name] MRN: [id number] DOB: 1930-01-20   Cancelled Treatment:    Reason Eval/Treat Not Completed: Patient at procedure or test/ unavailable (HD).  10/13/2012 Cipriano Mile OTR/L Pager [phone] Office [phone]

## 2012-10-13 NOTE — Progress Notes (Signed)
Subjective:  Patient denies any chest pain or shortness of breath tolerating hemodialysis. Patient had PermCath and AV fistula today tolerated procedure well  Objective:  Vital Signs in the last 24 hours: Temp:  [97.5 F (36.4 C)-98.9 F (37.2 C)] 98.3 F (36.8 C) (12/20 1700) Pulse Rate:  [59-60] 60  (12/20 1730) Resp:  [6-21] 14  (12/20 1730) BP: (119-179)/(50-69) 134/62 mmHg (12/20 1730) SpO2:  [90 %-100 %] 93 % (12/20 1700) Weight:  [83.2 kg (183 lb 6.8 oz)-84.959 kg (187 lb 4.8 oz)] 83.2 kg (183 lb 6.8 oz) (12/20 1700)  Intake/Output from previous day: 12/19 0701 - 12/20 0700 In: 1391.8 [P.O.:880; I.V.:379.8; IV Piggyback:132] Out: 4075 [Urine:4075] Intake/Output from this shift: Total I/O In: 253 [I.V.:253] Out: 900 [Urine:875; Blood:25]  Physical Exam: Neck: no adenopathy, no carotid bruit, no JVD and supple, symmetrical, trachea midline Lungs: Clear to auscultation anterolaterally Heart: regular rate and rhythm, S1, S2 normal and Soft systolic murmur noted Abdomen: soft, non-tender; bowel sounds normal; no masses,  no organomegaly Extremities: extremities normal, atraumatic, no cyanosis or edema  Lab Results:  Basename 10/13/12 0600 10/12/12 0413  WBC 7.9 5.8  HGB 11.0* 9.9*  PLT 230 207    Basename 10/13/12 0600 10/12/12 0413  NA 147* 145  K 3.5 3.2*  CL 108 108  CO2 28 28  GLUCOSE 87 87  BUN 62* 68*  CREATININE 2.83* 3.02*   No results found for this basename: TROPONINI:2,CK,MB:2 in the last 72 hours Hepatic Function Panel  Basename 10/13/12 0600  PROT --  ALBUMIN 2.3*  AST --  ALT 66*  ALKPHOS --  BILITOT --  BILIDIR --  IBILI --   No results found for this basename: CHOL in the last 72 hours No results found for this basename: PROTIME in the last 72 hours  Imaging: Imaging results have been reviewed and Dg Chest Port 1 View  10/13/2012  *RADIOLOGY REPORT*  Clinical Data: Status post diatek catheter placement.  PORTABLE CHEST - 1 VIEW   Comparison: Two-view chest 10/07/2012.  Findings: The right IJ dialysis catheter is in place.  The short limb is at the cavoatrial junction.  The distal limb is in the right atrium.  Pacing defibrillator wires are stable.  A defibrillator pad is in place.  The left hemidiaphragm is elevated. Moderate pulmonary vascular congestion has increased slightly.  IMPRESSION:  1.  Interval placement of right IJ dialysis catheter without radiographically evident complication. 2. Cardiomegaly with increasing pulmonary vascular congestion compatible with congestive heart failure.   Original Report Authenticated By: Marin Roberts, M.D.     Cardiac Studies:  Assessment/Plan:  Compensated systolic heart failure  Coronary artery disease history of anteroseptal wall MI in the past  Ischemic dilated cardiomyopathy status post by V. ICD  Atrial fibrillation  Chronic kidney disease stage IV now on hemodialysis Anemia  Hypothyroidism  Hyperalbuminemia  Chronic right leg ulcers healing  Mild volume overload  Hypokalemia Plan Restart Coumadin if okay with surgery Dr. Algie Coffer on-call for weekend. Okay to discharge if okay from renal point of view  LOS: 15 days    [name] 10/13/2012, 5:53 PM

## 2012-10-13 NOTE — Op Note (Signed)
OPERATIVE REPORT  Date of Surgery: 09/28/2012 - 10/13/2012  Surgeon: Josephina Gip, MD  Assistant: Lorrine Kin  Pre-op Diagnosis: End Stage Renal Disease  Post-op Diagnosis: End Stage Renal Disease  Procedure: Procedure(s): ARTERIOVENOUS (AV) FISTULA CREATION-right-radial cephalic  INSERTION OF DIALYSIS CATHETER-right IJ-23 cm-using ultrasound guidance Anesthesia: MAC  EBL: Minimal  Complications: None  Procedure Details: Patient was taken to the operating room placed in the supine position at which time the right upper extremity was prepped with Betadine scrub and solution draped in routine sterile manner. After infiltration with 1% Xylocaine with epinephrine a short longitudinal incision was made between the radial artery and cephalic vein proximal to the wrist. Cephalic vein was dissected free its branches ligated with 3 and 4-0 silk ties and divided. Was an adequate vein about 2-1/2 mm in size and the smallest area. Radial artery was exposed and the fascia. It was diffusely diseased with some calcific plaque but there was a soft area which had an excellent pulse which was long enough for the anastomosis. Artery was occluded proximally and distally a 15 blade extended with Potts scissors. Vein was carefully measured spatulated and anastomosed end to side with 6-0 Prolene. Clamps released there was good pulse and thrill in the fistula and good Doppler flow at the antecubital area. Adequate hemostasis was achieved wound closed in layers with Vicryl in a subcuticular fashion with Dermabond following this the upper chest and neck were exposed the right internal jugular vein was imaged using B-mode ultrasound with the SonoSite machine and was thought to be adequate for insertion of a dialysis catheter. After prepping and draping in the routine sterile manner right internal jugular vein was entered using a supraclavicular approach guidewire passed into the right atrium under fluoroscopic  guidance. After dilating the tract appropriately 23 cm hemodialysis catheter was passed through peel-away sheath positioned appropriately tunneled peripherally and secured with nylon sutures. The wound was closed with Vicryl in a subcuticular fashion. Sterile dressing applied patient taken to the recovery room for chest x-ray   Josephina Gip, MD 10/13/2012 12:46 PM

## 2012-10-13 NOTE — Progress Notes (Signed)
PT Cancellation Note  Patient Details Name: [name] MRN: [id number] DOB: May 11, 1930   Cancelled Treatment:    Reason Eval/Treat Not Completed: Patient at procedure or test/unavailable. Will f/u as able after procedure.    [facility] HELEN 10/13/2012, 9:35 AM Pager: [phone]

## 2012-10-13 NOTE — H&P (View-Only) (Signed)
INR still elevated.  Will recheck tomorrow but most likely graft placement will have to be delayed until INR <1.6  Charles Fields, MD [facility] Office: [phone] Pager: [phone]  

## 2012-10-13 NOTE — Interval H&P Note (Signed)
History and Physical Interval Note:  10/13/2012 10:21 AM  [name]  has presented today for surgery, with the diagnosis of End Stage Renal Disease  The various methods of treatment have been discussed with the patient and family. After consideration of risks, benefits and other options for treatment, the patient has consented to  Procedure(s) (LRB) with comments: ARTERIOVENOUS (AV) FISTULA CREATION (Right) - Creation right arm arteriovenous fistula vs. Insertion of right arm arteriovenous gortex graft INSERTION OF DIALYSIS CATHETER (N/A) as a surgical intervention .  The patient's history has been reviewed, patient examined, no change in status, stable for surgery.  I have reviewed the patient's chart and labs.  Questions were answered to the patient's satisfaction.     Josephina Gip

## 2012-10-13 NOTE — Transfer of Care (Signed)
Immediate Anesthesia Transfer of Care Note  Patient: [name]  Procedure(s) Performed: Procedure(s) (LRB) with comments: ARTERIOVENOUS (AV) FISTULA CREATION (Right) - Creation right arm arteriovenous fistula  INSERTION OF DIALYSIS CATHETER (N/A) - right internal jugular vein  Patient Location: PACU  Anesthesia Type:MAC  Level of Consciousness: awake, alert  and oriented  Airway & Oxygen Therapy: Patient Spontanous Breathing and Patient connected to face mask oxygen  Post-op Assessment: Report given to PACU RN and Post -op Vital signs reviewed and stable  Post vital signs: Reviewed and stable  Complications: No apparent anesthesia complications

## 2012-10-13 NOTE — OR Nursing (Addendum)
First procedure creation of right arm fistula ended at 1205, Second procedure diatek catheter started at 1216.

## 2012-10-13 NOTE — Anesthesia Procedure Notes (Signed)
Procedure Name: MAC Performed by: Armandina Gemma MARIE Pre-anesthesia Checklist: Patient identified, Emergency Drugs available, Suction available, Patient being monitored and Timeout performed Patient Re-evaluated:Patient Re-evaluated prior to inductionOxygen Delivery Method: Simple face mask

## 2012-10-14 LAB — CBC
HCT: 30.5 % — ABNORMAL LOW (ref 39.0–52.0)
MCV: 88.4 fL (ref 78.0–100.0)
Platelets: 240 10*3/uL (ref 150–400)
RBC: 3.45 MIL/uL — ABNORMAL LOW (ref 4.22–5.81)
WBC: 8 10*3/uL (ref 4.0–10.5)

## 2012-10-14 LAB — RENAL FUNCTION PANEL
BUN: 47 mg/dL — ABNORMAL HIGH (ref 6–23)
CO2: 28 mEq/L (ref 19–32)
Chloride: 105 mEq/L (ref 96–112)
Creatinine, Ser: 2.3 mg/dL — ABNORMAL HIGH (ref 0.50–1.35)
GFR calc non Af Amer: 25 mL/min — ABNORMAL LOW (ref >90)
Potassium: 3.5 mEq/L (ref 3.5–5.1)

## 2012-10-14 LAB — HEPARIN LEVEL (UNFRACTIONATED): Heparin Unfractionated: 0.48 IU/mL (ref 0.30–0.70)

## 2012-10-14 LAB — HEPATITIS B SURFACE ANTIBODY,QUALITATIVE: Hep B S Ab: NONREACTIVE

## 2012-10-14 MED ORDER — SODIUM CHLORIDE 0.9 % IV SOLN
125.0000 mg | INTRAVENOUS | Status: DC
  Administered 2012-10-14: 125 mg via INTRAVENOUS
  Filled 2012-10-14: qty 10

## 2012-10-14 MED ORDER — WARFARIN SODIUM 4 MG PO TABS
4.0000 mg | ORAL_TABLET | Freq: Once | ORAL | Status: DC
  Filled 2012-10-14: qty 1

## 2012-10-14 NOTE — Progress Notes (Addendum)
ANTICOAGULATION CONSULT NOTE - Follow Up Consult  Pharmacy Consult for Heparin and Coumadin Indication: atrial fibrillation  No Known Allergies  Patient Measurements: Height: 5\' 11"  (180.3 cm) Weight: 181 lb 14.1 oz (82.5 kg) IBW/kg (Calculated) : 75.3  Heparin Dosing Weight: 82.5kg  Vital Signs: Temp: 97.8 F (36.6 C) (12/21 0900) Temp src: Oral (12/21 0900) BP: 145/55 mmHg (12/21 1130) Pulse Rate: 61  (12/21 1130)  Labs:  Basename 10/14/12 1010 10/14/12 0945 10/14/12 0800 10/13/12 2225 10/13/12 0600 10/12/12 1220 10/12/12 0413  HGB -- 9.9* -- -- 11.0* -- --  HCT -- 30.5* -- -- 34.1* -- 30.4*  PLT -- 240 -- -- 230 -- 207  APTT -- -- -- -- -- -- --  LABPROT -- -- 18.4* -- 17.5* 19.1* --  INR -- -- 1.58* -- 1.48 1.66* --  HEPARINUNFRC -- -- 0.48 0.29* 0.20* -- --  CREATININE 2.30* -- -- -- 2.83* -- 3.02*  CKTOTAL -- -- -- -- -- -- --  CKMB -- -- -- -- -- -- --  TROPONINI -- -- -- -- -- -- --    Estimated Creatinine Clearance: 27.3 ml/min (by C-G formula based on Cr of 2.3).   Medications:  Heparin 1800 units/hr  Assessment: 82yom on heparin bridging to Coumadin for Afib. Heparin and Coumadin were both restarted s/p AVF and Permcath placement 12/20. Heparin level (0.48) is now therapeutic after rate increase. INR (1.58) is subtherapeutic as expected with Coumadin on hold and Vitamin K given x 3 doses. INR did respond to Coumadin 5mg  - will decrease dose toward PTA regimen (3mg  daily) and follow-up AM INR. - H/H and Plts stable - No significant bleeding reported (some catheter site bleeding reported per RN)  Goal of Therapy:  INR 2-3 Heparin level 0.3-0.7 units/ml Monitor platelets by anticoagulation protocol: Yes   Plan:  1. Continue heparin 1800 units/hr (18 ml/hr) 2. Coumadin 4mg  po x 1 today 3. Follow-up AM INR, heparin level and CBC  Demerius, [place] [phone] 10/14/2012,11:53 AM  Addendum: Notified by HD RN that patient is having some oozing from  permcath site. Per RN, MD wishes to continue anticoagulation at this time. [facility] has been notified and is coming to assess the patient. Heparin level is on the lower end of goal range (0.48, goal 0.3-0.7) but is increasing with rate increase this AM - will decrease heparin rate slightly to hopefully keep level near lower end of goal.   Plan: 1. Decrease heparin drip to 1750 units/hr (17.5 ml/hr) 2. Follow-up bleeding and [facility] recommendations  Samantha, Olivera, PharmD 10/14/2012, 1:56 PM

## 2012-10-14 NOTE — Progress Notes (Signed)
Hemodialysis-Called to unit for bleeding catheter insertion site. Surgifoam applied along with new dressing.

## 2012-10-14 NOTE — Progress Notes (Signed)
10/14/2012 9:00 AM Hemodialysis Outpatient Note; This patient has been accepted at the [facility] on a Tues/Thurs/Sat 2nd shift schedule. Due to the Holiday schedule the clinic will begin his 1st treatment on Monday December 23rd at 1100 AM.Thank you. Tilman Neat

## 2012-10-14 NOTE — Progress Notes (Addendum)
Pressure applied to permacath site right clavical area, bleeding at this time.  Bleeding has been stopped.  Dialysis nurse not available at this time, but, aware of issue, instructed to hold pressure.  Pressure held for 15 minutes to get bleeding to stop.  Vital signs stable.  Client is alert and cooperative.Marland Kitchen  Charge nurse with me at bedside.  1559 Dressing had to be changed due to bleeding excess.  Pressure dressing applied and Rapid Nurse has been notified to come and assess.    1639 Dr. Imogene Burn notified of continuing bleeding at cath site, pressure dressing held x 15 minutes.  Ace bandage applied to assist with pressure.  Client is alert and cooperative.  Vital signs stable.  Good pulses  right extremtity. Dressing over fistula site is intact, small amount of bleeding noted, no increase noted since arrival back to unit.  Coumadin will be held per Dr. Algie Coffer    1900 Dr. Imogene Burn in and put suture in at bleeding site.  Applied pressure to Vascath site for 60 minutes until suture could be put in to stop bleeding.   1910 IV therapy changed dressing per policy.  No bleeding at this time.  Dressing at fistula site reinforced.  Left arm swollen and elevated.  Client is alert and oriented .  Heparin off at this time per order.  2000 Heparin remains off per Dr. Algie Coffer and patient is to be reassessed in the morning for bleeding.  Pharmacist made aware of issue and will alert in am.

## 2012-10-14 NOTE — Progress Notes (Signed)
PT Cancellation Note  Patient Details Name: [name] MRN: [id number] DOB: 10/24/1930   Cancelled Treatment:    Reason Eval/Treat Not Completed: Medical issues which prohibited therapy.  Patient continues bleeding from permacath site and fistula site. Per RN hold PT today.   Vena Austria 10/14/2012, 5:10 PM Durenda Hurt. Renaldo Fiddler, [facility] Pager [phone]

## 2012-10-14 NOTE — Progress Notes (Signed)
[facility]  Daily Progress Note  Assessment/Planning: POD #1 s/p TDC placement, L RC AVF   No steal sx  Follow up in office with Dr. Hart Rochester in 4-6 weeks  Subjective  - 1 Day Post-Op  No complaints  Objective Filed Vitals:   10/13/12 1858 10/13/12 1909 10/13/12 2205 10/14/12 0538  BP: 147/56 148/52 142/62 139/61  Pulse: 60 60 63 64  Temp:  98.2 F (36.8 C) 98.1 F (36.7 C) 98 F (36.7 C)  TempSrc:  Oral Oral Oral  Resp:  20 20 20   Height:      Weight:  180 lb 12.4 oz (82 kg)  181 lb (82.1 kg)  SpO2:  93% 94% 94%    Intake/Output Summary (Last 24 hours) at 10/14/12 0809 Last data filed at 10/14/12 0434  Gross per 24 hour  Intake    253 ml  Output   2519 ml  Net  -2266 ml    PULM  CTAB, TDC in place, no obvious hematoma CV  RRR GI  soft, NTND VASC  L hand warm, hand grip 5/5, sensation intact, radial pulse not palpable  Laboratory CBC    Component Value Date/Time   WBC 7.9 10/13/2012 0600   HGB 11.0* 10/13/2012 0600   HCT 34.1* 10/13/2012 0600   PLT 230 10/13/2012 0600    BMET    Component Value Date/Time   NA 147* 10/13/2012 0600   K 3.5 10/13/2012 0600   CL 108 10/13/2012 0600   CO2 28 10/13/2012 0600   GLUCOSE 87 10/13/2012 0600   GLUCOSE 117* 11/04/2006 1154   BUN 62* 10/13/2012 0600   CREATININE 2.83* 10/13/2012 0600   CALCIUM 9.1 10/13/2012 0600   GFRNONAA 20* 10/13/2012 0600   GFRAA 23* 10/13/2012 0600    Leonides Sake, MD [facility] [place] Office: [phone] Pager: [phone]  10/14/2012, 8:09 AM

## 2012-10-14 NOTE — Progress Notes (Signed)
Subjective:  2nd HD today (Catheter did not work well 1st tmt - abbreviated TMT - TPA to catheter - now working well but oozing) Breathing OK  Objective Vital signs in last 24 hours: Filed Vitals:   10/14/12 1000 10/14/12 1030 10/14/12 1130 10/14/12 1200  BP: 155/57 168/57 145/55 148/57  Pulse: 60 60 61 59  Temp:      TempSrc:      Resp: 17 17 22 19   Height:      Weight:      SpO2:       Weight change: -1.759 kg (-3 lb 14 oz)  Intake/Output Summary (Last 24 hours) at 10/14/12 1232 Last data filed at 10/14/12 0848  Gross per 24 hour  Intake    483 ml  Output   1719 ml  Net  -1236 ml  1.2 liters with HD #1  WEIGHTS:  10/14/12 82.5 kg 10/13/12 84.9 kg 10/12/12 87.5 kg  10/11/12 87.6 kg  10/10/12 90.67 kg  10/09/12  90.9 kg  10/08/12  91.6 kg  10/07/12  92.4 kg  112/07/13 90.3 kg  09/29/12  89.4 kg  09/28/12  90.2 kg   Physical Exam:  Blood pressure 148/57, pulse 59, temperature 97.8 F (36.6 C), temperature source Oral, resp. rate 19, height 5\' 11"  (1.803 m), weight 82.5 kg (181 lb 14.1 oz), SpO2 92.00%. Very pleasant BM  JVP 6 cm  Diminished breath sounds  Pacer left chest Abdominal wall edema/2+ LE edema  New right AVF wrist incision glued  Good bruit along the forearm  Right sided perm cath with dressing in place - oozing onto dressing and from beneath Left arm edema unchanged  Labs: Basic Metabolic Panel:  Lab 10/14/12 6578 10/13/12 0600 10/12/12 0413 10/11/12 0540 10/10/12 0550 10/09/12 0455  NA 143 147* 145 144 142 143  K 3.5 3.5 3.2* 3.5 3.3* 3.6  CL 105 108 108 108 105 106  CO2 28 28 28 25 27 27   GLUCOSE 116* 87 87 115* 109* 89  BUN 47* 62* 68* 70* 71* 69*  CREATININE 2.30* 2.83* 3.02* 3.05* 2.99* 3.17*  ALB -- -- -- -- -- --  CALCIUM 8.7 9.1 8.7 8.5 8.4 8.2*  PHOS 4.5 1.5* 1.7* 1.8* 2.3 --   Liver Function Tests:  Lab 10/14/12 1010 10/13/12 0600 10/12/12 0413  AST -- -- --  ALT -- 66* --  ALKPHOS -- -- --  BILITOT -- -- --  PROT -- --  --  ALBUMIN 2.2* 2.3* 2.2*   No results found for this basename: LIPASE:3,AMYLASE:3 in the last 168 hours No results found for this basename: AMMONIA:3 in the last 168 hours CBC:  Lab 10/14/12 0945 10/13/12 0600 10/12/12 0413 10/11/12 0540  WBC 8.0 7.9 5.8 5.8  NEUTROABS -- -- -- --  HGB 9.9* 11.0* 9.9* 9.6*  HCT 30.5* 34.1* 30.4* 29.1*  MCV 88.4 89.0 88.1 86.6  PLT 240 230 207 177   PT/INR: @labrcntip (inr:5) Cardiac Enzymes: No results found for this basename: CKTOTAL:5,CKMB:5,CKMBINDEX:5,TROPONINI:5 in the last 168 hours CBG: No results found for this basename: GLUCAP:5 in the last 168 hours  Iron Studies: No results found for this basename: IRON:30,TIBC:30,TRANSFERRIN:30,FERRITIN:30 in the last 168 hours Studies/Results: Dg Chest Port 1 View  10/13/2012  *RADIOLOGY REPORT*  Clinical Data: Status post diatek catheter placement.  PORTABLE CHEST - 1 VIEW  Comparison: Two-view chest 10/07/2012.  Findings: The right IJ dialysis catheter is in place.  The short limb is at the cavoatrial junction.  The distal  limb is in the right atrium.  Pacing defibrillator wires are stable.  A defibrillator pad is in place.  The left hemidiaphragm is elevated. Moderate pulmonary vascular congestion has increased slightly.  IMPRESSION:  1.  Interval placement of right IJ dialysis catheter without radiographically evident complication. 2. Cardiomegaly with increasing pulmonary vascular congestion compatible with congestive heart failure.   Original Report Authenticated By: Marin Roberts, M.D.    Dg Fluoro Guide Cv Line-no Report  10/13/2012  CLINICAL DATA: insertion of dialysisi catheter   FLOURO GUIDE CV LINE  Fluoroscopy was utilized by the requesting physician.  No radiographic  interpretation.     Medications:    . heparin 1,800 Units/hr (10/14/12 4540)      . allopurinol  100 mg Oral Daily  . atorvastatin  40 mg Oral Daily  . darbepoetin (ARANESP) injection - DIALYSIS  60 mcg  Intravenous Q Tue-HD  . digoxin  0.125 mg Oral Daily  . ferric gluconate (FERRLECIT/NULECIT) IV  125 mg Intravenous Q Sat-HD  . isosorbide-hydrALAZINE  1 tablet Oral TID  . levothyroxine  25 mcg Oral QAC breakfast  . multivitamin with minerals  1 tablet Oral Daily  . sodium chloride  3 mL Intravenous Q12H  . traZODone  150 mg Oral QHS  . warfarin  4 mg Oral ONCE-1800  . Warfarin - Pharmacist Dosing Inpatient   Does not apply q1800    I  have reviewed scheduled and prn medications.  ASSESSMENT/RECOMMENDATIONS   78 year old BM with baseline CKD 4 (2.8 in September/peak 4 in December)  with refractory heart failure/edema/cardiorenal picture and lack of response to diuretics ( decompensated systolic heart failure, coronary artery disease history of anteroseptal wall MI in the past , ischemic dilated cardiomyopathy status post by V. ICD, atrial fibrillation) anemia, hypothyroidism)   1. Acute kidney injury on CKD4 - will be ESRD   Creatinine around 3 but poor overall response to diuretics   Has anasarca   Needs long term dialysis for cardiorenal syndrome - will be end stage renal disease (secondary to HTN plus cardiorenal syndrome)   S/p right sided permcath and right lower arm AVF (12/21)  Permcath oozing briskly now - ask VVS to come and ? place suture  2nd HD today  Outpt HD will be TTS at [facility] (Mon/Thurs/Sat on holiday schedule for next 2 weeks)  2. Cardiomyopathy with low BP  3. Vol overload remains significant  4. 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 Anemia started Darbepoetin (60/d dosed 12/18 X 1)   Started with weekly iron with HD once starts  6. 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

## 2012-10-14 NOTE — Progress Notes (Signed)
Subjective:  Awaiting hemodialysis today. Feeling better.  Objective:  Vital Signs in the last 24 hours: Temp:  [97.5 F (36.4 C)-98.3 F (36.8 C)] 97.8 F (36.6 C) (12/21 0900) Pulse Rate:  [59-72] 60  (12/21 0934) Cardiac Rhythm:  [-] A-V Sequential paced (12/21 0910) Resp:  [6-21] 15  (12/21 0929) BP: (89-179)/(42-69) 154/59 mmHg (12/21 0934) SpO2:  [90 %-100 %] 92 % (12/21 0900) Weight:  [82 kg (180 lb 12.4 oz)-83.2 kg (183 lb 6.8 oz)] 82.5 kg (181 lb 14.1 oz) (12/21 0900)  Physical Exam: BP Readings from Last 1 Encounters:  10/14/12 154/59    Wt Readings from Last 1 Encounters:  10/14/12 82.5 kg (181 lb 14.1 oz)    Weight change: -1.759 kg (-3 lb 14 oz)  HEENT: [place]/AT, Eyes-Brown, PERL, EOMI, Conjunctiva-Pale pink, Sclera-Non-icteric Neck: No JVD, No bruit, Trachea midline. Lungs:  Clear, Bilateral. Cardiac:  Regular rhythm, normal S1 and S2, no S3.  Abdomen:  Soft, non-tender. Extremities:  No cyanosis. No clubbing. Chronic left arm swelling. CNS: AxOx3, Cranial nerves grossly intact, moves all 4 extremities. Right handed. Skin: Warm and dry.   Intake/Output from previous day: 12/20 0701 - 12/21 0700 In: 253 [I.V.:253] Out: 2519 [Urine:1275; Blood:25]    Lab Results: BMET    Component Value Date/Time   NA 147* 10/13/2012 0600   K 3.5 10/13/2012 0600   CL 108 10/13/2012 0600   CO2 28 10/13/2012 0600   GLUCOSE 87 10/13/2012 0600   GLUCOSE 117* 11/04/2006 1154   BUN 62* 10/13/2012 0600   CREATININE 2.83* 10/13/2012 0600   CALCIUM 9.1 10/13/2012 0600   GFRNONAA 20* 10/13/2012 0600   GFRAA 23* 10/13/2012 0600   CBC    Component Value Date/Time   WBC 7.9 10/13/2012 0600   RBC 3.83* 10/13/2012 0600   HGB 11.0* 10/13/2012 0600   HCT 34.1* 10/13/2012 0600   PLT 230 10/13/2012 0600   MCV 89.0 10/13/2012 0600   MCH 28.7 10/13/2012 0600   MCHC 32.3 10/13/2012 0600   RDW 18.0* 10/13/2012 0600   LYMPHSABS 0.9 09/28/2012 1644   MONOABS 0.6 09/28/2012 1644   EOSABS 0.1 09/28/2012 1644   BASOSABS 0.0 09/28/2012 1644   CARDIAC ENZYMES Lab Results  Component Value Date   CKTOTAL 110 10/17/2010   CKMB 2.5 10/17/2010   TROPONINI <0.30 09/29/2012    Scheduled Meds:   . allopurinol  100 mg Oral Daily  . atorvastatin  40 mg Oral Daily  . darbepoetin (ARANESP) injection - DIALYSIS  60 mcg Intravenous Q Tue-HD  . digoxin  0.125 mg Oral Daily  . ferric gluconate (FERRLECIT/NULECIT) IV  125 mg Intravenous Q Tue-HD  . isosorbide-hydrALAZINE  1 tablet Oral TID  . levothyroxine  25 mcg Oral QAC breakfast  . multivitamin with minerals  1 tablet Oral Daily  . sodium chloride  3 mL Intravenous Q12H  . traZODone  150 mg Oral QHS  . Warfarin - Pharmacist Dosing Inpatient   Does not apply q1800   Continuous Infusions:   . heparin 1,800 Units/hr (10/14/12 0619)   PRN Meds:.sodium chloride, acetaminophen, ondansetron (ZOFRAN) IV, oxyCODONE, sodium chloride  Assessment/Plan:  Patient Active Hospital Problem List: Compensated systolic heart failure  Coronary artery disease history of anteroseptal wall MI in the past  Ischemic dilated cardiomyopathy status post by V. ICD  Atrial fibrillation  Chronic kidney disease stage IV now on hemodialysis  Anemia  Hypothyroidism  Hypoalbuminemia  Chronic right leg ulcers healing  Hypokalemia-improving  Medical treatment  LOS: 16 days    [name]  MD  10/14/2012, 10:08 AM

## 2012-10-14 NOTE — Procedures (Signed)
I have personally attended this patient's dialysis session.  This is second HD.  Permcath working well this AM after overnight TPA but is oozing steadily/saturating dressing.  VVS notified.  BP thus far stable.  Hershell Brandl B

## 2012-10-15 LAB — GLUCOSE, CAPILLARY
Glucose-Capillary: 144 mg/dL — ABNORMAL HIGH (ref 70–99)
Glucose-Capillary: 127 mg/dL — ABNORMAL HIGH (ref 70–99)

## 2012-10-15 LAB — RENAL FUNCTION PANEL
Albumin: 2.1 g/dL — ABNORMAL LOW (ref 3.5–5.2)
Calcium: 8.5 mg/dL (ref 8.4–10.5)
GFR calc Af Amer: 30 mL/min — ABNORMAL LOW (ref >90)
GFR calc non Af Amer: 26 mL/min — ABNORMAL LOW (ref >90)
Glucose, Bld: 85 mg/dL (ref 70–99)
Phosphorus: 1.2 mg/dL — ABNORMAL LOW (ref 2.3–4.6)
Potassium: 3.5 mEq/L (ref 3.5–5.1)
Sodium: 143 mEq/L (ref 135–145)

## 2012-10-15 LAB — PROTIME-INR
INR: 1.42 (ref 0.00–1.49)
Prothrombin Time: 17 seconds — ABNORMAL HIGH (ref 11.6–15.2)

## 2012-10-15 LAB — CBC
Hemoglobin: 8.7 g/dL — ABNORMAL LOW (ref 13.0–17.0)
MCH: 29 pg (ref 26.0–34.0)
MCHC: 32.7 g/dL (ref 30.0–36.0)
Platelets: 224 10*3/uL (ref 150–400)

## 2012-10-15 LAB — HEPARIN LEVEL (UNFRACTIONATED)
Heparin Unfractionated: <0.1 IU/mL — ABNORMAL LOW (ref 0.30–0.70)
Heparin Unfractionated: 0.29 IU/mL — ABNORMAL LOW (ref 0.30–0.70)

## 2012-10-15 MED ORDER — HEPARIN (PORCINE) IN NACL 100-0.45 UNIT/ML-% IJ SOLN
1800.0000 [IU]/h | INTRAMUSCULAR | Status: DC
  Administered 2012-10-15: 1800 [IU]/h via INTRAVENOUS
  Filled 2012-10-15 (×4): qty 250

## 2012-10-15 MED ORDER — WARFARIN SODIUM 5 MG PO TABS
5.0000 mg | ORAL_TABLET | Freq: Once | ORAL | Status: AC
  Administered 2012-10-15: 5 mg via ORAL
  Filled 2012-10-15: qty 1

## 2012-10-15 MED ORDER — SODIUM GLYCEROPHOSPHATE 1 MMOLE/ML IV SOLN
20.0000 mmol | Freq: Once | INTRAVENOUS | Status: AC
  Administered 2012-10-15: 20 mmol via INTRAVENOUS
  Filled 2012-10-15 (×2): qty 20

## 2012-10-15 MED ORDER — HEPARIN (PORCINE) IN NACL 100-0.45 UNIT/ML-% IJ SOLN
1700.0000 [IU]/h | INTRAMUSCULAR | Status: DC
  Filled 2012-10-15 (×2): qty 250

## 2012-10-15 MED ORDER — WARFARIN - PHARMACIST DOSING INPATIENT
Freq: Every day | Status: DC
  Administered 2012-10-15 – 2012-10-17 (×3)

## 2012-10-15 MED ORDER — SODIUM PHOSPHATE 3 MMOLE/ML IV SOLN: 20.0000 mmol | Freq: Once | INTRAVENOUS | Status: DC

## 2012-10-15 NOTE — Progress Notes (Signed)
Cardiac monitor down from 0800-1000, client monitored and remained stable.  No complaint of pain or distress noted during this downtime. 

## 2012-10-15 NOTE — Progress Notes (Signed)
Subjective:  Feeling better. Right shoulder/chest area bleed stopped post suture by surgery.   Objective:  Vital Signs in the last 24 hours: Temp:  [98 F (36.7 C)-99.4 F (37.4 C)] 99.3 F (37.4 C) (12/22 0640) Pulse Rate:  [59-64] 64  (12/22 1022) Cardiac Rhythm:  [-] Atrial paced (12/21 2100) Resp:  [15-20] 20  (12/22 0640) BP: (104-149)/(42-60) 114/46 mmHg (12/22 0640) SpO2:  [97 %-100 %] 98 % (12/22 0640) Weight:  [79.7 kg (175 lb 11.3 oz)-80.2 kg (176 lb 12.9 oz)] 80.2 kg (176 lb 12.9 oz) (12/22 0640)  Physical Exam: BP Readings from Last 1 Encounters:  10/15/12 114/46    Wt Readings from Last 1 Encounters:  10/15/12 80.2 kg (176 lb 12.9 oz)    Weight change: -0.7 kg (-1 lb 8.7 oz)  HEENT: [place]/AT, Eyes-Brown, PERL, EOMI, Conjunctiva-Pale, Sclera-Non-icteric Neck: No JVD, No bruit, Trachea midline. Lungs:  Clear, Bilateral. Cardiac:  Regular rhythm, normal S1 and S2, no S3.  Abdomen:  Soft, non-tender. Extremities:  No edema present. No cyanosis. No clubbing. Chronic right lower leg wound with dressing. Chronic left arm swelling. Right subclavian access catheter. CNS: AxOx3, Cranial nerves grossly intact, moves all 4 extremities. Right handed. Skin: Warm and dry.   Intake/Output from previous day: 12/21 0701 - 12/22 0700 In: 1736.1 [P.O.:880; I.V.:596.1; IV Piggyback:260] Out: 2700 [Urine:200]    Lab Results: BMET    Component Value Date/Time   NA 143 10/15/2012 0556   K 3.5 10/15/2012 0556   CL 106 10/15/2012 0556   CO2 29 10/15/2012 0556   GLUCOSE 85 10/15/2012 0556   GLUCOSE 117* 11/04/2006 1154   BUN 31* 10/15/2012 0556   CREATININE 2.22* 10/15/2012 0556   CALCIUM 8.5 10/15/2012 0556   GFRNONAA 26* 10/15/2012 0556   GFRAA 30* 10/15/2012 0556   CBC    Component Value Date/Time   WBC 7.8 10/15/2012 0556   RBC 3.00* 10/15/2012 0556   HGB 8.7* 10/15/2012 0556   HCT 26.6* 10/15/2012 0556   PLT 224 10/15/2012 0556   MCV 88.7 10/15/2012 0556   MCH 29.0  10/15/2012 0556   MCHC 32.7 10/15/2012 0556   RDW 18.1* 10/15/2012 0556   LYMPHSABS 0.9 09/28/2012 1644   MONOABS 0.6 09/28/2012 1644   EOSABS 0.1 09/28/2012 1644   BASOSABS 0.0 09/28/2012 1644   CARDIAC ENZYMES Lab Results  Component Value Date   CKTOTAL 110 10/17/2010   CKMB 2.5 10/17/2010   TROPONINI <0.30 09/29/2012    Scheduled Meds:   . allopurinol  100 mg Oral Daily  . atorvastatin  40 mg Oral Daily  . darbepoetin (ARANESP) injection - DIALYSIS  60 mcg Intravenous Q Tue-HD  . digoxin  0.125 mg Oral Daily  . ferric gluconate (FERRLECIT/NULECIT) IV  125 mg Intravenous Q Sat-HD  . isosorbide-hydrALAZINE  1 tablet Oral TID  . levothyroxine  25 mcg Oral QAC breakfast  . multivitamin with minerals  1 tablet Oral Daily  . sodium chloride  3 mL Intravenous Q12H  . traZODone  150 mg Oral QHS   Continuous Infusions:  PRN Meds:.sodium chloride, acetaminophen, ondansetron (ZOFRAN) IV, oxyCODONE, sodium chloride  Assessment/Plan:  Patient Active Hospital Problem List:  Compensated systolic heart failure  Coronary artery disease history of anteroseptal wall MI in the past  Ischemic dilated cardiomyopathy status post by V. ICD  Atrial fibrillation  Chronic kidney disease stage IV now on hemodialysis  Anemia  Hypothyroidism  Hypoalbuminemia  Chronic right leg ulcers healing  Hypokalemia-improving  Continue medical  treatment   LOS: 17 days    Jacob Cobb  MD  10/15/2012, 11:46 AM

## 2012-10-15 NOTE — Progress Notes (Signed)
ANTICOAGULATION CONSULT NOTE - Follow Up Consult  Pharmacy Consult for Heparin Indication: atrial fibrillation  No Known Allergies  Patient Measurements: Height: 5\' 11"  (180.3 cm) Weight: 176 lb 12.9 oz (80.2 kg) IBW/kg (Calculated) : 75.3  Heparin Dosing Weight: 80.2 kg  Vital Signs: Temp: 98.3 F (36.8 C) (12/22 2038) Temp src: Oral (12/22 2038) BP: 112/48 mmHg (12/22 2038) Pulse Rate: 63  (12/22 2038)  Labs:  Basename 10/15/12 2042 10/15/12 0556 10/14/12 1010 10/14/12 0945 10/14/12 0800 10/13/12 0600  HGB -- 8.7* -- 9.9* -- --  HCT -- 26.6* -- 30.5* -- 34.1*  PLT -- 224 -- 240 -- 230  APTT -- -- -- -- -- --  LABPROT -- 17.0* -- -- 18.4* 17.5*  INR -- 1.42 -- -- 1.58* 1.48  HEPARINUNFRC 0.29* <0.10* -- -- 0.48 --  CREATININE -- 2.22* 2.30* -- -- 2.83*  CKTOTAL -- -- -- -- -- --  CKMB -- -- -- -- -- --  TROPONINI -- -- -- -- -- --    Estimated Creatinine Clearance: 28.3 ml/min (by C-G formula based on Cr of 2.22).   Medications:  Infusions:     . heparin 1,700 Units/hr (10/15/12 1751)    Assessment: 78 y/o male to resume heparin for Afib. Heparin and Coumadin were stopped last night and patient now back on heparin with a level slightly below goal (HL=0.29) at 1700 units/hr  Goal of Therapy:  Heparin level 0.3-0.5 units/ml due to recent bleeding Monitor platelets by anticoagulation protocol: Yes   Plan:  -Increase infusion to 1800 unit/hr -Will recheck a heparin level in am -CBC daily -Will follow coumadin plans  Harland German, Pharm D 10/15/2012 9:24 PM

## 2012-10-15 NOTE — Progress Notes (Addendum)
ANTICOAGULATION CONSULT NOTE - Follow Up Consult  Pharmacy Consult for Heparin Indication: atrial fibrillation  No Known Allergies  Patient Measurements: Height: 5\' 11"  (180.3 cm) Weight: 176 lb 12.9 oz (80.2 kg) IBW/kg (Calculated) : 75.3  Heparin Dosing Weight: 80.2 kg  Vital Signs: Temp: 99.3 F (37.4 C) (12/22 0640) Temp src: Oral (12/22 0640) BP: 114/46 mmHg (12/22 0640) Pulse Rate: 64  (12/22 1022)  Labs:  Basename 10/15/12 0556 10/14/12 1010 10/14/12 0945 10/14/12 0800 10/13/12 2225 10/13/12 0600  HGB 8.7* -- 9.9* -- -- --  HCT 26.6* -- 30.5* -- -- 34.1*  PLT 224 -- 240 -- -- 230  APTT -- -- -- -- -- --  LABPROT 17.0* -- -- 18.4* -- 17.5*  INR 1.42 -- -- 1.58* -- 1.48  HEPARINUNFRC <0.10* -- -- 0.48 0.29* --  CREATININE 2.22* 2.30* -- -- -- 2.83*  CKTOTAL -- -- -- -- -- --  CKMB -- -- -- -- -- --  TROPONINI -- -- -- -- -- --    Estimated Creatinine Clearance: 28.3 ml/min (by C-G formula based on Cr of 2.22).   Medications:  Infusions:    . [DISCONTINUED] heparin Stopped (10/14/12 1800)    Assessment: 78 y/o male to resume heparin for Afib. Heparin and Coumadin were stopped last night after patient had bleeding from permacath site. Dr. Imogene Burn placed suture at bleeding site. Spoke with RN who states patient has had no bleeding since then.  Hb is low - will continue to watch closely. Platelets are stable.  Goal of Therapy:  Heparin level 0.3-0.5 units/ml due to recent bleeding Monitor platelets by anticoagulation protocol: Yes   Plan:  -Resume heparin drip at 1700 units/hr IV with no bolus -Heparin level 8 hours after resumed -Daily heparin level and CBC while on heparin -MD, please advise when safe to resume Coumadin  [facility], [place].D., BCPS Clinical Pharmacist Pager: [phone] 10/15/2012 11:58 AM   Addendum: Pharmacy consulted to resume Coumadin. INR is subtherapeutic today at 1.42.  Coumadin 5 mg po tonight INR daily  [facility], [address].D., BCPS Clinical Pharmacist Pager: [phone] 10/15/2012 1:41 PM

## 2012-10-15 NOTE — Progress Notes (Signed)
Subjective:  Had continued oozing from permcath AND from AVF Had suture placed and catheter no longer oozing; dressing on AVF - not oozing either Both heparin and coumadin were d/c'd last night - heparin has been restarted Feels "so much better" Edema MUCH improved  Objective Vital signs in last 24 hours: Filed Vitals:   10/14/12 1856 10/14/12 2117 10/15/12 0640 10/15/12 1022  BP: 104/42 106/45 114/46   Pulse: 60 60 60 64  Temp: 99.4 F (37.4 C) 98 F (36.7 C) 99.3 F (37.4 C)   TempSrc: Oral Oral Oral   Resp: 20 17 20    Height:      Weight:   80.2 kg (176 lb 12.9 oz)   SpO2: 98% 97% 98%    Weight change: -0.7 kg (-1 lb 8.7 oz)  Intake/Output Summary (Last 24 hours) at 10/15/12 1244 Last data filed at 10/15/12 1123  Gross per 24 hour  Intake 1376.06 ml  Output   2675 ml  Net -1298.94 ml  WEIGHTS:  10/14/12 80.2 kg post HD 10/14/12 82.5 kg pre HD 10/13/12 82 kg  Post HD  10/12/12 87.5 kg  10/11/12 87.6 kg  10/10/12 90.67 kg   Physical Exam:  Blood pressure 114/46, pulse 64, temperature 99.3 F (37.4 C), temperature source Oral, resp. rate 20, height 5\' 11"  (1.803 m), weight 80.2 kg (176 lb 12.9 oz), SpO2 98.00%. Very pleasant BM  JVP 5 cm  Lungs clear Pacer left chest  Abdominal wall edema/trace+ LE edema 1+ right greater than left  New right AVF wrist incision glued dressing on  Not oozing Good bruit along the forearm  Right sided perm cath with dressing in place - dry - no bleeding Left arm edema reduced  Labs: Basic Metabolic Panel:  Lab 10/15/12 1610 10/14/12 1010 10/13/12 0600 10/12/12 0413 10/11/12 0540 10/10/12 0550 10/09/12 0455  NA 143 143 147* 145 144 142 143  K 3.5 3.5 3.5 3.2* 3.5 3.3* 3.6  CL 106 105 108 108 108 105 106  CO2 29 28 28 28 25 27 27   GLUCOSE 85 116* 87 87 115* 109* 89  BUN 31* 47* 62* 68* 70* 71* 69*  CREATININE 2.22* 2.30* 2.83* 3.02* 3.05* 2.99* 3.17*  ALB -- -- -- -- -- -- --  CALCIUM 8.5 8.7 9.1 8.7 8.5 8.4 8.2*  PHOS 1.2*  4.5 1.5* 1.7* 1.8* 2.3 --   Liver Function Tests:  Lab 10/15/12 0556 10/14/12 1010 10/13/12 0600  AST -- -- --  ALT -- -- 66*  ALKPHOS -- -- --  BILITOT -- -- --  PROT -- -- --  ALBUMIN 2.1* 2.2* 2.3*   CBC:  Lab 10/15/12 0556 10/14/12 0945 10/13/12 0600 10/12/12 0413  WBC 7.8 8.0 7.9 5.8  NEUTROABS -- -- -- --  HGB 8.7* 9.9* 11.0* 9.9*  HCT 26.6* 30.5* 34.1* 30.4*  MCV 88.7 88.4 89.0 88.1  PLT 224 240 230 207   Lab Results  Component Value Date   INR 1.42 10/15/2012   INR 1.58* 10/14/2012   INR 1.48 10/13/2012   CBG:  Lab 10/15/12 1109  GLUCAP 144*   Studies/Results: Dg Chest Port 1 View  10/13/2012  *RADIOLOGY REPORT*  Clinical Data: Status post diatek catheter placement.  PORTABLE CHEST - 1 VIEW  Comparison: Two-view chest 10/07/2012.  Findings: The right IJ dialysis catheter is in place.  The short limb is at the cavoatrial junction.  The distal limb is in the right atrium.  Pacing defibrillator wires are stable.  A defibrillator pad is in place.  The left hemidiaphragm is elevated. Moderate pulmonary vascular congestion has increased slightly.  IMPRESSION:  1.  Interval placement of right IJ dialysis catheter without radiographically evident complication. 2. Cardiomegaly with increasing pulmonary vascular congestion compatible with congestive heart failure.   Original Report Authenticated By: Marin Roberts, M.D.    Medications:    . heparin 1,700 Units/hr (10/15/12 1205)      . allopurinol  100 mg Oral Daily  . atorvastatin  40 mg Oral Daily  . darbepoetin (ARANESP) injection - DIALYSIS  60 mcg Intravenous Q Tue-HD  . digoxin  0.125 mg Oral Daily  . ferric gluconate (FERRLECIT/NULECIT) IV  125 mg Intravenous Q Sat-HD  . isosorbide-hydrALAZINE  1 tablet Oral TID  . levothyroxine  25 mcg Oral QAC breakfast  . multivitamin with minerals  1 tablet Oral Daily  . sodium chloride  3 mL Intravenous Q12H  . traZODone  150 mg Oral QHS    I  have reviewed  scheduled and prn medications.  ASSESSMENT/RECOMMENDATIONS   78 year old BM with baseline CKD 4 (2.8 in September/peak 4 in December)  with refractory heart failure/edema/cardiorenal picture and lack of response to diuretics ( decompensated systolic heart failure, coronary artery disease history of anteroseptal wall MI in the past , ischemic dilated cardiomyopathy status post by V. ICD, atrial fibrillation) anemia, hypothyroidism) who has required initiation of dialysis primarily for volume control   1. Acute kidney injury on CKD4 - will be ESRD   Needs long term dialysis for cardiorenal syndrome - will be end stage renal disease (secondary to HTN plus cardiorenal syndrome)   S/p right sided permcath and right lower arm AVF (12/21)   Permcath oozing resolved after suture  2nd HD today   Outpt HD will be TTS at [facility] (Mon/Thurs/Sat on holiday schedule for next 2 weeks)   HD on Monday to remain c/w outpt schedule  Have not reached a dry weight yet 2. Cardiomyopathy with low BP  3. 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 Anemia started Darbepoetin (60/d dosed 12/18 X 1)   Started with weekly iron with HD  5. 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 When to restart - should be OK to resume today Will need arrangements for INR f/u with cards at time of discharge  Camille Bal, MD [facility] [phone] pager 10/15/2012, 12:44 PM

## 2012-10-16 ENCOUNTER — Encounter (HOSPITAL_COMMUNITY): Payer: Self-pay | Admitting: Vascular Surgery

## 2012-10-16 LAB — GLUCOSE, CAPILLARY
Glucose-Capillary: 79 mg/dL (ref 70–99)
Glucose-Capillary: 115 mg/dL — ABNORMAL HIGH (ref 70–99)

## 2012-10-16 LAB — CBC
Hemoglobin: 8.4 g/dL — ABNORMAL LOW (ref 13.0–17.0)
MCH: 28.8 pg (ref 26.0–34.0)
Platelets: 224 10*3/uL (ref 150–400)
RBC: 2.92 MIL/uL — ABNORMAL LOW (ref 4.22–5.81)
WBC: 8.6 10*3/uL (ref 4.0–10.5)

## 2012-10-16 LAB — RENAL FUNCTION PANEL
CO2: 27 mEq/L (ref 19–32)
Calcium: 8.2 mg/dL — ABNORMAL LOW (ref 8.4–10.5)
Chloride: 106 mEq/L (ref 96–112)
GFR calc Af Amer: 22 mL/min — ABNORMAL LOW (ref >90)
GFR calc non Af Amer: 19 mL/min — ABNORMAL LOW (ref >90)
Glucose, Bld: 92 mg/dL (ref 70–99)
Potassium: 3.1 mEq/L — ABNORMAL LOW (ref 3.5–5.1)
Sodium: 143 mEq/L (ref 135–145)

## 2012-10-16 LAB — HEPARIN LEVEL (UNFRACTIONATED): Heparin Unfractionated: 0.47 IU/mL (ref 0.30–0.70)

## 2012-10-16 LAB — PROTIME-INR
INR: 1.64 — ABNORMAL HIGH (ref 0.00–1.49)
Prothrombin Time: 18.9 seconds — ABNORMAL HIGH (ref 11.6–15.2)

## 2012-10-16 MED ORDER — WARFARIN SODIUM 5 MG PO TABS
5.0000 mg | ORAL_TABLET | Freq: Once | ORAL | Status: AC
  Administered 2012-10-16: 5 mg via ORAL
  Filled 2012-10-16: qty 1

## 2012-10-16 MED ORDER — DARBEPOETIN ALFA-POLYSORBATE 150 MCG/0.3ML IJ SOLN: 150.0000 ug | INTRAMUSCULAR | Status: DC

## 2012-10-16 MED ORDER — DARBEPOETIN ALFA-POLYSORBATE 60 MCG/0.3ML IJ SOLN: 60.0000 ug | INTRAMUSCULAR | Status: DC

## 2012-10-16 NOTE — Progress Notes (Signed)
Full assmt not do by RN Hessie Diener on 7-3p shift. Pt was in Hemo until 12, and return with bleeding cath sight. RN/charge spent time w/ bleeding cath.

## 2012-10-16 NOTE — Progress Notes (Signed)
Pt blood from DA cath. Surg PA page reviewed said hold Pressure. RN and Consulting civil engineer held for 1hr Pt still bleeding. Hemo DA page and IV team call to place silicone style dressing. Will continue to monitor

## 2012-10-16 NOTE — Progress Notes (Signed)
ANTICOAGULATION CONSULT NOTE - Follow Up Consult  Pharmacy Consult for Coumadin Indication: atrial fibrillation  No Known Allergies  Patient Measurements: Height: 5\' 11"  (180.3 cm) Weight: 171 lb 15.3 oz (78 kg) IBW/kg (Calculated) : 75.3   Vital Signs: Temp: 97.6 F (36.4 C) (12/23 1126) Temp src: Oral (12/23 1126) BP: 118/57 mmHg (12/23 1126) Pulse Rate: 60  (12/23 1126)  Labs:  Basename 10/16/12 0703 10/15/12 2042 10/15/12 0556 10/14/12 1010 10/14/12 0945 10/14/12 0800  HGB 8.4* -- 8.7* -- -- --  HCT 25.9* -- 26.6* -- 30.5* --  PLT 224 -- 224 -- 240 --  APTT -- -- -- -- -- --  LABPROT 18.9* -- 17.0* -- -- 18.4*  INR 1.64* -- 1.42 -- -- 1.58*  HEPARINUNFRC 0.47 0.29* <0.10* -- -- --  CREATININE 2.88* -- 2.22* 2.30* -- --  CKTOTAL -- -- -- -- -- --  CKMB -- -- -- -- -- --  TROPONINI -- -- -- -- -- --    Estimated Creatinine Clearance: 21.8 ml/min (by C-G formula based on Cr of 2.88).  Assessment: 82yom resumed on heparin and coumadin yesterday for afib. Heparin level today is therapeutic but heparin discontinued due to catheter site oozing. Ok to continue coumadin per Dr. Sharyn Lull. INR remains below goal but trending up. Was therapeutic on home dose of 3mg  daily but received vitamin k 5mg  on 12/17, 12/18, and 12/19 for permacath placement so may require a few higher doses to overcome vitamin k resistance. No coumadin given 12/21.   Goal of Therapy:  INR 2-3 Monitor platelets by anticoagulation protocol: Yes   Plan:  1) Repeat coumadin 5mg  x 1 2) Follow up INR, bleeding in AM  Fredrik Rigger 10/16/2012,12:45 PM

## 2012-10-16 NOTE — Progress Notes (Signed)
Met with patient and wife Jeronimo Norma this afternoon to discuss possible alternatives to CIR placement.  Patient may possible no require CIR admissions; thus would need either short term SNF or home with Home Health. Patient and wife both agree that she would not be able to manage patient in his current condition and request short term SNF as "back up" plan to CIR. They are still hoping for CIR placement. Bed search process discussed and intiated.  Lorri Frederick. [place] Pugh  [phone]

## 2012-10-16 NOTE — Progress Notes (Signed)
No therapy yet since HD begun.  Unsure if will need CIR.  Will check back.  [phone]

## 2012-10-16 NOTE — Progress Notes (Signed)
Physical Therapy Treatment Patient Details Name: [name] MRN: [id number] DOB: [birth year] Today's Date: 10/16/2012 Time: 2130-8657 PT Time Calculation (min): 17 min  PT Assessment / Plan / Recommendation Comments on Treatment Session  Progressing well with gait today.  No bleeding noted from permacath site (was bleeding this am).  Feel patient will need ST-SNF for continued therapy at discharge.    Follow Up Recommendations  SNF     Does the patient have the potential to tolerate intense rehabilitation     Barriers to Discharge        Equipment Recommendations  None recommended by PT    Recommendations for Other Services    Frequency Min 3X/week   Plan Discharge plan needs to be updated;Frequency remains appropriate    Precautions / Restrictions Precautions Precautions: Fall Restrictions Weight Bearing Restrictions: No   Pertinent Vitals/Pain     Mobility  Bed Mobility Bed Mobility: Supine to Sit Supine to Sit: 5: Supervision;With rails;HOB flat Details for Bed Mobility Assistance: Verbal cues for technique and safety Transfers Transfers: Sit to Stand;Stand to Sit Sit to Stand: 5: Supervision;With upper extremity assist;From bed Stand to Sit: 5: Supervision;With upper extremity assist;To bed Details for Transfer Assistance: Verbal cues for safety with transitions. Ambulation/Gait Ambulation/Gait Assistance: 4: Min guard Ambulation Distance (Feet): 300 Feet Assistive device: Rolling walker Ambulation/Gait Assistance Details: Cues to stand tall with gait.  Cues for safe use of RW during turns. Gait Pattern: Step-through pattern;Decreased stride length;Trunk flexed Gait velocity: slowed     PT Goals Acute Rehab PT Goals PT Goal: Supine/Side to Sit - Progress: Progressing toward goal PT Goal: Sit to Stand - Progress: Progressing toward goal PT Goal: Stand to Sit - Progress: Progressing toward goal PT Transfer Goal: Bed to Chair/Chair to Bed - Progress:  Progressing toward goal PT Goal: Ambulate - Progress: Progressing toward goal  Visit Information  Last PT Received On: 10/16/12 Assistance Needed: +1    Subjective Data  Subjective: "I'm not going home.  I'm going to a rehab place"   Cognition  Overall Cognitive Status: Appears within functional limits for tasks assessed/performed Arousal/Alertness: Awake/alert Orientation Level: Appears intact for tasks assessed Behavior During Session: [facility] for tasks performed    Balance     End of Session PT - End of Session Equipment Utilized During Treatment: Gait belt Activity Tolerance: Patient tolerated treatment well Patient left: in bed;with call bell/phone within reach (sitting on EOB) Nurse Communication: Mobility status   GP     Vena Austria 10/16/2012, 5:16 PM Durenda Hurt. Renaldo Fiddler, [facility] Acute Rehab Services Pager [phone]

## 2012-10-16 NOTE — Progress Notes (Signed)
Subjective: Interval History: none.  Objective: Vital signs in last 24 hours: Temp:  [97.9 F (36.6 C)-98.5 F (36.9 C)] 98.3 F (36.8 C) (12/23 0651) Pulse Rate:  [59-64] 60  (12/23 1030) Resp:  [12-19] 14  (12/23 1030) BP: (108-137)/(28-79) 110/54 mmHg (12/23 1030) SpO2:  [94 %-99 %] 94 % (12/23 0651) Weight:  [79.8 kg (175 lb 14.8 oz)-80.9 kg (178 lb 5.6 oz)] 80.9 kg (178 lb 5.6 oz) (12/23 0651) Weight change: -2.7 kg (-5 lb 15.2 oz)  Intake/Output from previous day: 12/22 0701 - 12/23 0700 In: 1192 [P.O.:1060; I.V.:98; IV Piggyback:34] Out: 75 [Urine:75] Intake/Output this shift:    General appearance: alert, cooperative and appears stated age Resp: diminished breath sounds bilaterally Cardio: regular rate and rhythm, systolic murmur: holosystolic 2/6, blowing at lower left sternal border and Pacer L Subclavian area GI: liver down 4 cm, pos bs, soft Extremities: AVF R FA B&T  Lab Results:  [facility] 10/16/12 0703 10/15/12 0556  WBC 8.6 7.8  HGB 8.4* 8.7*  HCT 25.9* 26.6*  PLT 224 224   BMET:  Basename 10/16/12 0703 10/15/12 0556  NA 143 143  K 3.1* 3.5  CL 106 106  CO2 27 29  GLUCOSE 92 85  BUN 39* 31*  CREATININE 2.88* 2.22*  CALCIUM 8.2* 8.5   No results found for this basename: PTH:2 in the last 72 hours Iron Studies: No results found for this basename: IRON,TIBC,TRANSFERRIN,FERRITIN in the last 72 hours  Studies/Results: No results found.  I have reviewed the patient's current medications.  Assessment/Plan: 1 CRF on HD to get TIW.  Vol ok, olc ok. Set up for out patient with d/c 2 Anemia epo /fe 3 HPTH mild 4 CM stabel 5 Afib rate and paced  P hd, epo, vit, access.   LOS: 18 days   Matalie Romberger L 10/16/2012,10:59 AM

## 2012-10-16 NOTE — Procedures (Signed)
I was present at this session.  I have reviewed the session itself and made appropriate changes.  Doing well Cath flow 400. Shanora Christensen L 12/23/201310:58 AM

## 2012-10-16 NOTE — Progress Notes (Signed)
Subjective:  Patient denies any chest pain or shortness of breath and persistent oozing from the dialysis catheter site patient on IV heparin and Coumadin. Objective:  Vital Signs in the last 24 hours: Temp:  [97.6 F (36.4 C)-98.5 F (36.9 C)] 97.6 F (36.4 C) (12/23 1126) Pulse Rate:  [59-64] 60  (12/23 1126) Resp:  [12-21] 15  (12/23 1126) BP: (108-137)/(28-79) 118/57 mmHg (12/23 1126) SpO2:  [94 %-100 %] 100 % (12/23 1126) Weight:  [78 kg (171 lb 15.3 oz)-80.9 kg (178 lb 5.6 oz)] 78 kg (171 lb 15.3 oz) (12/23 1126)  Intake/Output from previous day: 12/22 0701 - 12/23 0700 In: 1192 [P.O.:1060; I.V.:98; IV Piggyback:34] Out: 75 [Urine:75] Intake/Output from this shift: Total I/O In: -  Out: 3000 [Other:3000]  Physical Exam: Neck: no adenopathy, no carotid bruit, no JVD and supple, symmetrical, trachea midline Lungs: Decreased breath sound at bases Heart: regular rate and rhythm, S1, S2 normal and Soft systolic murmur noted Abdomen: soft, non-tender; bowel sounds normal; no masses,  no organomegaly Extremities: extremities normal, atraumatic, no cyanosis or edema  Lab Results:  [facility] 10/16/12 0703 10/15/12 0556  WBC 8.6 7.8  HGB 8.4* 8.7*  PLT 224 224    Basename 10/16/12 0703 10/15/12 0556  NA 143 143  K 3.1* 3.5  CL 106 106  CO2 27 29  GLUCOSE 92 85  BUN 39* 31*  CREATININE 2.88* 2.22*   No results found for this basename: TROPONINI:2,CK,MB:2 in the last 72 hours Hepatic Function Panel  Basename 10/16/12 0703  PROT --  ALBUMIN 2.1*  AST --  ALT --  ALKPHOS --  BILITOT --  BILIDIR --  IBILI --   No results found for this basename: CHOL in the last 72 hours No results found for this basename: PROTIME in the last 72 hours  Imaging: Imaging results have been reviewed and No results found.  Cardiac Studies:  Assessment/Plan:  Compensated systolic heart failure  Coronary artery disease history of anteroseptal wall MI in the past  Ischemic dilated  cardiomyopathy status post by V. ICD  Atrial fibrillation  Chronic kidney disease stage IV now on hemodialysis  Anemia  Hypothyroidism  Hyperalbuminemia  Chronic right leg ulcers healing  Plan DC IV heparin Continue Coumadin per pharmacy Possible discharge tomorrow if catheter site okay and okay with renal service and vascular surgery  LOS: 18 days    [name] 10/16/2012, 12:07 PM

## 2012-10-17 LAB — BASIC METABOLIC PANEL
BUN: 24 mg/dL — ABNORMAL HIGH (ref 6–23)
GFR calc Af Amer: 26 mL/min — ABNORMAL LOW (ref >90)
GFR calc non Af Amer: 22 mL/min — ABNORMAL LOW (ref >90)
Potassium: 3.4 mEq/L — ABNORMAL LOW (ref 3.5–5.1)

## 2012-10-17 LAB — PROTIME-INR
INR: 1.62 — ABNORMAL HIGH (ref 0.00–1.49)
Prothrombin Time: 18.7 seconds — ABNORMAL HIGH (ref 11.6–15.2)

## 2012-10-17 LAB — CBC
HCT: 26.4 % — ABNORMAL LOW (ref 39.0–52.0)
MCHC: 32.2 g/dL (ref 30.0–36.0)
Platelets: 262 10*3/uL (ref 150–400)
RDW: 18.4 % — ABNORMAL HIGH (ref 11.5–15.5)
WBC: 7.5 10*3/uL (ref 4.0–10.5)

## 2012-10-17 LAB — GLUCOSE, CAPILLARY

## 2012-10-17 MED ORDER — POTASSIUM CHLORIDE CRYS ER 20 MEQ PO TBCR
20.0000 meq | EXTENDED_RELEASE_TABLET | Freq: Once | ORAL | Status: AC
  Administered 2012-10-17: 20 meq via ORAL
  Filled 2012-10-17: qty 1

## 2012-10-17 MED ORDER — WARFARIN SODIUM 7.5 MG PO TABS
7.5000 mg | ORAL_TABLET | Freq: Once | ORAL | Status: AC
  Administered 2012-10-17: 7.5 mg via ORAL
  Filled 2012-10-17: qty 1

## 2012-10-17 NOTE — Progress Notes (Signed)
ANTICOAGULATION CONSULT NOTE - Follow Up Consult  Pharmacy Consult for Coumadin Indication: atrial fibrillation  No Known Allergies  Patient Measurements: Height: 5\' 11"  (180.3 cm) Weight: 170 lb 13.7 oz (77.5 kg) IBW/kg (Calculated) : 75.3   Vital Signs: Temp: 99.9 F (37.7 C) (12/24 0508) Temp src: Oral (12/24 0508) BP: 119/48 mmHg (12/24 0508) Pulse Rate: 60  (12/24 0508)  Labs:  Basename 10/17/12 0530 10/16/12 0703 10/15/12 2042 10/15/12 0556  HGB 8.5* 8.4* -- --  HCT 26.4* 25.9* -- 26.6*  PLT 262 224 -- 224  APTT -- -- -- --  LABPROT 18.7* 18.9* -- 17.0*  INR 1.62* 1.64* -- 1.42  HEPARINUNFRC -- 0.47 0.29* <0.10*  CREATININE 2.55* 2.88* -- 2.22*  CKTOTAL -- -- -- --  CKMB -- -- -- --  TROPONINI -- -- -- --    Estimated Creatinine Clearance: 24.6 ml/min (by C-G formula based on Cr of 2.55).  Assessment: 82yom continues on coumadin for afib. INR remains subtherapeutic and is unchanged from yesterday. Heparin stopped 12/23 due to catheter site bleeding - no further bleeding noted. Hgb/Hct low but stable.  Was therapeutic on home dose of 3mg  daily but received vitamin k 5mg  on 12/17, 12/18, and 12/19 for permacath placement. No coumadin given 12/21.  Goal of Therapy:  INR 2-3 Monitor platelets by anticoagulation protocol: Yes   Plan:  1) Increase coumadin to 7.5mg  x 1 2) Follow up INR in AM  Fredrik Rigger 10/17/2012,8:29 AM

## 2012-10-17 NOTE — Progress Notes (Addendum)
Pt doing well in PT yesterday-will not need CIR.  CIR will sign off.  Left vm for pt's SW, Lovette Cliche. [phone]

## 2012-10-17 NOTE — Progress Notes (Signed)
Patient evaluated for community based chronic disease management services with [facility] Care Management Program as a benefit of patient's [employer]. Patient will receive a post discharge transition of care call and will be evaluated for monthly home visits for assessments and disease process education. Spoke with patient at bedside and left voice message for his wife Dreshawn Hendershott [phone]) to explain [facility] Care Management services.  Patient may benefit from CHF education, Telehealth Weight Monitoring, and coordination of community resources.  His wife is concerned that she may not be able to provide the adequate level of care at home.  Will continue to monitor patient disposition.  Left contact information and [facility] literature at bedside. Made inpatient Case Manager aware that [facility] Care Management following. Of note, [facility] Care Management services does not replace or interfere with any services that are arranged by inpatient case management or social work.  For additional questions or referrals please contact Anibal Henderson BSN RN [facility] Liaison at [phone].

## 2012-10-17 NOTE — Progress Notes (Signed)
Physical Therapy Treatment Patient Details Name: [name] MRN: [id number] DOB: 07-30-1930 Today's Date: 10/17/2012 Time: 8295-6213 PT Time Calculation (min): 33 min  PT Assessment / Plan / Recommendation Comments on Treatment Session  Pt admitted s/p fall at home with SOB and RLE edema. Pt moving well and feels confidant in his ability to mobilize but is mostly concerned with prior episodes and what he and wife would do to manage bleeding should it occur at home. Pt encouraged to continue ambulation with nursing and HEP during the day. Pt very pleasant and will continue to progress mobility as pt out in the community PTA.     Follow Up Recommendations  Home health PT;SNF;Supervision/Assistance - 24 hour (pending pt decision regarding nursing care)     Does the patient have the potential to tolerate intense rehabilitation     Barriers to Discharge        Equipment Recommendations       Recommendations for Other Services    Frequency Min 2X/week   Plan Discharge plan needs to be updated;Frequency needs to be updated    Precautions / Restrictions Precautions Precautions: Fall   Pertinent Vitals/Pain No pain    Mobility  Bed Mobility Bed Mobility: Supine to Sit Supine to Sit: 6: Modified independent (Device/Increase time);With rails;HOB flat Details for Bed Mobility Assistance: increased time to complete Transfers Sit to Stand: 5: Supervision;From bed;From toilet Stand to Sit: 5: Supervision;To toilet;To chair/3-in-1 Details for Transfer Assistance: cueing for hand placement Ambulation/Gait Ambulation/Gait Assistance: 5: Supervision Ambulation Distance (Feet): 800 Feet Assistive device: Rolling walker Ambulation/Gait Assistance Details: cueing for position in RW, erect posture, and directional cues Gait Pattern: Step-through pattern;Decreased stride length;Trunk flexed Gait velocity: decreased Stairs: Yes Stairs Assistance: 5: Supervision Stairs Assistance Details  (indicate cue type and reason): cueing for sequence and supervision for safety Stair Management Technique: One rail Right Number of Stairs: 7     Exercises General Exercises - Lower Extremity Long Arc Quad: AROM;Both;20 reps;Seated Hip Flexion/Marching: AROM;Both;20 reps;Seated   PT Diagnosis:    PT Problem List:   PT Treatment Interventions:     PT Goals Acute Rehab PT Goals PT Goal: Supine/Side to Sit - Progress: Progressing toward goal PT Goal: Sit to Stand - Progress: Progressing toward goal PT Goal: Stand to Sit - Progress: Progressing toward goal PT Goal: Ambulate - Progress: Progressing toward goal PT Goal: Perform Home Exercise Program - Progress: Progressing toward goal  Visit Information  Last PT Received On: 10/17/12 Assistance Needed: +1    Subjective Data  Subjective: "I'm just worried that this will start bleeding and we won't know what to do at home"   Cognition  Overall Cognitive Status: Appears within functional limits for tasks assessed/performed Arousal/Alertness: Awake/alert Orientation Level: Appears intact for tasks assessed Behavior During Session: [facility] for tasks performed    Balance     End of Session PT - End of Session Equipment Utilized During Treatment: Gait belt Activity Tolerance: Patient tolerated treatment well Patient left: in chair;with call bell/phone within reach Nurse Communication: Mobility status   GP     Delorse Lek 10/17/2012, 12:27 PM Delaney Meigs, PT [phone]

## 2012-10-17 NOTE — Progress Notes (Signed)
10/17/12 1117 CIR denied pt. due to his deconditioning.  Pt and family are okay with pt. going to ST SNF.  Today, pt. stated he felt very weak.  Anticipate dc in 1-2 days.   Tera Mater, RN, BSN NCM [phone]

## 2012-10-17 NOTE — Progress Notes (Signed)
Subjective: Interval History: none.  Objective: Vital signs in last 24 hours: Temp:  [97.6 F (36.4 C)-99.9 F (37.7 C)] 99.9 F (37.7 C) (12/24 0508) Pulse Rate:  [59-60] 60  (12/24 0508) Resp:  [14-21] 18  (12/24 0508) BP: (110-145)/(48-60) 119/48 mmHg (12/24 0508) SpO2:  [93 %-100 %] 94 % (12/24 0508) Weight:  [77.5 kg (170 lb 13.7 oz)-78 kg (171 lb 15.3 oz)] 77.5 kg (170 lb 13.7 oz) (12/24 0508) Weight change: -1.8 kg (-3 lb 15.5 oz)  Intake/Output from previous day: 12/23 0701 - 12/24 0700 In: 200 [P.O.:200] Out: 3000  Intake/Output this shift: Total I/O In: 600 [P.O.:600] Out: -   General appearance: alert, cooperative and appears stated age Resp: diminished breath sounds bilaterally and rales bibasilar Cardio: S1, S2 normal and systolic murmur: holosystolic 2/6, blowing at apex GI: pos bs, liver down 4 cm Extremities: edema R Ij cath, [name] arm edema, R LA  AVF  B& T and  LICD  Lab Results:  [facility] 10/17/12 0530 10/16/12 0703  WBC 7.5 8.6  HGB 8.5* 8.4*  HCT 26.4* 25.9*  PLT 262 224   BMET:  Basename 10/17/12 0530 10/16/12 0703  NA 142 143  K 3.4* 3.1*  CL 107 106  CO2 28 27  GLUCOSE 99 92  BUN 24* 39*  CREATININE 2.55* 2.88*  CALCIUM 8.2* 8.2*   No results found for this basename: PTH:2 in the last 72 hours Iron Studies: No results found for this basename: IRON,TIBC,TRANSFERRIN,FERRITIN in the last 72 hours  Studies/Results: No results found.  I have reviewed the patient's current medications.  Assessment/Plan: 1 CRF for HD thurs.  Stable, some vol xs 2 Anemia epo/fe 3 Hpth 4 CHF stable with HD  5 Debill P HD epo/fe, vit D   LOS: 19 days   [name] 10/17/2012,10:27 AM

## 2012-10-17 NOTE — Progress Notes (Signed)
Occupational Therapy Treatment Patient Details Name: [name] MRN: [id number] DOB: 01/10/1930 Today's Date: 10/17/2012 Time: 4098-1191 OT Time Calculation (min): 25 min  OT Assessment / Plan / Recommendation Comments on Treatment Session Pt making steady progress. Feel pt will benefit from short stay at SNF prior to returning home.    Follow Up Recommendations  SNF    Barriers to Discharge   none    Equipment Recommendations  None recommended by OT    Recommendations for Other Services  none  Frequency Min 2X/week   Plan Discharge plan needs to be updated    Precautions / Restrictions Precautions Precautions: Fall Restrictions Weight Bearing Restrictions: No   Pertinent Vitals/Pain No c/o pain    ADL  Grooming: Wash/dry hands;Wash/dry face;Supervision/safety Where Assessed - Grooming: Unsupported standing Toileting - Clothing Manipulation and Hygiene: Modified independent Where Assessed - Toileting Clothing Manipulation and Hygiene: Other (comment) (sitting) Equipment Used: Gait belt;Rolling walker Transfers/Ambulation Related to ADLs: S ADL Comments: Improved endurance    OT Diagnosis:    OT Problem List:   OT Treatment Interventions:     OT Goals Acute Rehab OT Goals OT Goal Formulation: With patient/family Time For Goal Achievement: 10/24/12 Potential to Achieve Goals: Good ADL Goals Pt Will Perform Grooming: with modified independence;Standing at sink ADL Goal: Grooming - Progress: Met Pt Will Perform Upper Body Dressing: with modified independence;Sitting, chair;Sitting, bed ADL Goal: Upper Body Dressing - Progress: Progressing toward goals Pt Will Perform Lower Body Dressing: with modified independence;Sit to stand from chair;Sit to stand from bed;with adaptive equipment ADL Goal: Lower Body Dressing - Progress: Progressing toward goals Pt Will Transfer to Toilet: with modified independence;Ambulation;with DME;Comfort height toilet ADL Goal: Toilet  Transfer - Progress: Progressing toward goals Pt Will Perform Toileting - Clothing Manipulation: with modified independence;Standing ADL Goal: Toileting - Clothing Manipulation - Progress: Progressing toward goals Pt Will Perform Toileting - Hygiene: with modified independence;Sit to stand from 3-in-1/toilet ADL Goal: Toileting - Hygiene - Progress: Met Miscellaneous OT Goals Miscellaneous OT Goal #1: Pt will perform bed mobility with HOB flat at mod I level in prep for EOB ADLs. OT Goal: Miscellaneous Goal #1 - Progress: Met Miscellaneous OT Goal #2: Pt will demonstrate safe use of RW during all functional transfers in order to decrease fall risk. OT Goal: Miscellaneous Goal #2 - Progress: Met Miscellaneous OT Goal #3: Pt will perform dynamic standing balance task at mod I level >5 min in prep for ADLs. OT Goal: Miscellaneous Goal #3 - Progress: Met  Visit Information  Last OT Received On: 10/17/12 Assistance Needed: +1    Subjective Data      Prior Functioning       Cognition  Overall Cognitive Status: Appears within functional limits for tasks assessed/performed Area of Impairment: Problem solving Arousal/Alertness: Awake/alert Orientation Level: Appears intact for tasks assessed Behavior During Session: [facility] for tasks performed    Mobility  Shoulder Instructions Bed Mobility Bed Mobility: Supine to Sit Supine to Sit: 6: Modified independent (Device/Increase time);With rails;HOB flat Details for Bed Mobility Assistance: increased time to complete Transfers Transfers: Sit to Stand;Stand to Sit Sit to Stand: 5: Supervision;With upper extremity assist;From chair/3-in-1 Stand to Sit: 5: Supervision;With upper extremity assist;To chair/3-in-1 Details for Transfer Assistance: cueing for hand placement       Exercises  General Exercises - Lower Extremity Long Arc Quad: AROM;Both;20 reps;Seated Hip Flexion/Marching: AROM;Both;20 reps;Seated   Balance     End of Session OT  - End of Session Equipment Utilized  During Treatment: Gait belt Activity Tolerance: Patient tolerated treatment well Patient left: in chair;with call bell/phone within reach Nurse Communication: Mobility status  GO     Aengus Sauceda,[name] 10/17/2012, 4:17 PM [facility], OTR/L  [phone] 10/17/2012

## 2012-10-17 NOTE — Clinical Social Work Psychosocial (Addendum)
    Clinical Social Work Department BRIEF PSYCHOSOCIAL ASSESSMENT 10/17/2012  Patient:  [name], [name]     Account Number:  [id number]     Admit date:  09/28/2012  Clinical Social Worker:  Tiburcio Pea  Date/Time:  10/16/2012 04:30 PM  Referred by:  Physician  Date Referred:  10/15/2012 Referred for  SNF Placement   Other Referral:   Interview type:  Other - See comment Other interview type:   patient and wife    PSYCHOSOCIAL DATA Living Status:  WIFE Admitted from facility:   Level of care:   Primary support name:  [name]   [id number] Primary support relationship to patient:  SPOUSE Degree of support available:   Strong support    CURRENT CONCERNS Current Concerns  Post-Acute Placement   Other Concerns:    SOCIAL WORK ASSESSMENT / PLAN Patient had been assessed and hoped to go to CIR; they had assessed him and had planned to take him.  However, due to the long length of time of his hospitalization and improvement with Physical Therapy- patient has improved now to where CIR does not feel patient is appropriate for this unit. Met with patient and wife to discuss short term rehab in SNF vs home with [facility]  While patient wants to go home- his wife feels that she cannot manage him safely at this time. Thus- they agree to short term rehab. SNF bed search process discussed and initiated.  Fl2 placed on chart for MD's signature.   Assessment/plan status:  Psychosocial Support/Ongoing Assessment of Needs Other assessment/ plan:   Information/referral to community resources:   SNF bed list given to patient/wife  Aftercare needs discussed - to be arranged as needed by SNF    PATIENT'S/FAMILY'S RESPONSE TO PLAN OF CARE: Patient is alert and oriented; he is agreeable to short term SNF as is his wife. He hopes to be able to return home soon. They were very appreciative of CSW's assistance and the information provided.

## 2012-10-17 NOTE — Progress Notes (Signed)
Subjective:  Patient denies any chest pain or shortness of breath states does not feel strong enough to go home. He spoke with his wife and she is also not sure whether she will be able to take care of him at home. Will consult social worker for short-term skilled nursing facility. No further oozing from catheter site  Objective:  Vital Signs in the last 24 hours: Temp:  [97.6 F (36.4 C)-99.9 F (37.7 C)] 99.9 F (37.7 C) (12/24 0508) Pulse Rate:  [59-60] 60  (12/24 0508) Resp:  [13-21] 18  (12/24 0508) BP: (110-145)/(48-60) 119/48 mmHg (12/24 0508) SpO2:  [93 %-100 %] 94 % (12/24 0508) Weight:  [77.5 kg (170 lb 13.7 oz)-78 kg (171 lb 15.3 oz)] 77.5 kg (170 lb 13.7 oz) (12/24 0508)  Intake/Output from previous day: 12/23 0701 - 12/24 0700 In: 200 [P.O.:200] Out: 3000  Intake/Output from this shift: Total I/O In: 600 [P.O.:600] Out: -   Physical Exam: Neck: no adenopathy, no carotid bruit, no JVD and supple, symmetrical, trachea midline Lungs: Decreased breath sound at bases air entry improved Heart: regular rate and rhythm, S1, S2 normal and Soft systolic murmur noted Abdomen: soft, non-tender; bowel sounds normal; no masses,  no organomegaly Extremities: extremities normal, atraumatic, no cyanosis or edema  Lab Results:  [facility] 10/17/12 0530 10/16/12 0703  WBC 7.5 8.6  HGB 8.5* 8.4*  PLT 262 224    Basename 10/17/12 0530 10/16/12 0703  NA 142 143  K 3.4* 3.1*  CL 107 106  CO2 28 27  GLUCOSE 99 92  BUN 24* 39*  CREATININE 2.55* 2.88*   No results found for this basename: TROPONINI:2,CK,MB:2 in the last 72 hours Hepatic Function Panel  Basename 10/16/12 0703  PROT --  ALBUMIN 2.1*  AST --  ALT --  ALKPHOS --  BILITOT --  BILIDIR --  IBILI --   No results found for this basename: CHOL in the last 72 hours No results found for this basename: PROTIME in the last 72 hours  Imaging: Imaging results have been reviewed and No results found.  Cardiac  Studies:  Assessment/Plan:  Compensated systolic heart failure  Coronary artery disease history of anteroseptal wall MI in the past  Ischemic dilated cardiomyopathy status post by V. ICD  Atrial fibrillation  Chronic kidney disease stage IV now on hemodialysis  Anemia  Hypothyroidism  Hyperalbuminemia  Chronic right leg ulcers healing  Plan Continue present management Social worker for possible short-term skilled nursing facility  LOS: 19 days    Araina Butrick N 10/17/2012, 9:48 AM

## 2012-10-17 NOTE — Consult Note (Signed)
Wound care follow-up:  Previous wounds to right leg have all healed except 2 sites.  These have evolved into partial thickness stasis ulcers; .3X.3X.1cm and .8X.3X.1cm.  Both sites 100% red, no further drainage, induration, or swelling. Plan:  Foam dressing to protect and promote healing. Will not plan to follow further unless re-consulted.  [address] RN, MSN, [employer]  [phone]

## 2012-10-17 NOTE — Clinical Social Work Placement (Addendum)
    Clinical Social Work Department CLINICAL SOCIAL WORK PLACEMENT NOTE 10/17/2012  Patient:  [name], [name]  Account Number:  [id number] Admit date:  09/28/2012  Clinical Social Worker:  Lupita Leash CROWDER, LCSWA  Date/time:  10/16/2012 05:00 PM  Clinical Social Work is seeking post-discharge placement for this patient at the following level of care:   SKILLED NURSING   (*CSW will update this form in Epic as items are completed)   10/16/2012  Patient/family provided with [facility] Department of Clinical Social Work's list of facilities offering this level of care within the geographic area requested by the patient (or if unable, by the patient's family).  10/16/2012  Patient/family informed of their freedom to choose among providers that offer the needed level of care, that participate in Medicare, Medicaid or managed care program needed by the patient, have an available bed and are willing to accept the patient.  10/16/2012  Patient/family informed of [facility]' ownership interest in [facility], as well as of the fact that they are under no obligation to receive care at this facility.  PASARR submitted to EDS on 10/17/12 PASARR number received from EDS on 10/17/12 FL2 transmitted to all facilities in geographic area requested by pt/family on  10/16/2012 FL2 transmitted to all facilities within larger geographic area on   Patient informed that his/her managed care company has contracts with or will negotiate with  certain facilities, including the following:   NA     Patient/family informed of bed offers received:  10/17/12 Patient chooses bed at [facility] Physician recommends and patient chooses bed at    Patient to be transferred to [facility]  on  10/18/2012 Patient to be transferred to facility by [facility])  The following physician request were entered in Epic:   Additional Comments:10/07/12  Per MD- plan d/c  to SNF tomorrow. Ok per [employer]- Pension scheme manager for [facility]. Wife to go sign admit papers today.  Notified pts' nurse of d/c. Instructions left for RN regarding d/c arrangements to be completed tomorrow.  Also- on call CSW name and number left for RN. Notified oncall CSW- [employer] of above.   Lorri Frederick. [place]  [phone]

## 2012-10-18 LAB — CBC
HCT: 26.4 % — ABNORMAL LOW (ref 39.0–52.0)
MCHC: 32.6 g/dL (ref 30.0–36.0)
MCV: 89.2 fL (ref 78.0–100.0)
Platelets: 272 10*3/uL (ref 150–400)
RDW: 18.5 % — ABNORMAL HIGH (ref 11.5–15.5)

## 2012-10-18 LAB — PROTIME-INR: INR: 1.87 — ABNORMAL HIGH (ref 0.00–1.49)

## 2012-10-18 MED ORDER — WARFARIN SODIUM 5 MG PO TABS
5.0000 mg | ORAL_TABLET | Freq: Once | ORAL | Status: DC
  Filled 2012-10-18: qty 1

## 2012-10-18 MED ORDER — ISOSORB DINITRATE-HYDRALAZINE 20-37.5 MG PO TABS: 1.0000 | ORAL_TABLET | Freq: Three times a day (TID) | ORAL | Status: DC

## 2012-10-18 MED ORDER — WARFARIN SODIUM 4 MG PO TABS: 4.0000 mg | ORAL_TABLET | Freq: Every day | ORAL | Status: DC

## 2012-10-18 MED ORDER — LEVOTHYROXINE SODIUM 25 MCG PO TABS: 25.0000 ug | ORAL_TABLET | Freq: Every day | ORAL | Status: DC

## 2012-10-18 NOTE — Progress Notes (Signed)
ANTICOAGULATION CONSULT NOTE - Follow Up Consult  Pharmacy Consult for Coumadin Indication: atrial fibrillation  No Known Allergies  Patient Measurements: Height: 5\' 11"  (180.3 cm) Weight: 170 lb 3.1 oz (77.2 kg) IBW/kg (Calculated) : 75.3   Vital Signs: Temp: 98.3 F (36.8 C) (12/25 0655) Temp src: Oral (12/25 0655) BP: 124/44 mmHg (12/25 0655) Pulse Rate: 64  (12/25 0655)  Labs:  Basename 10/18/12 0455 10/17/12 0530 10/16/12 0703 10/15/12 2042  HGB 8.6* 8.5* -- --  HCT 26.4* 26.4* 25.9* --  PLT 272 262 224 --  APTT -- -- -- --  LABPROT 20.8* 18.7* 18.9* --  INR 1.87* 1.62* 1.64* --  HEPARINUNFRC -- -- 0.47 0.29*  CREATININE -- 2.55* 2.88* --  CKTOTAL -- -- -- --  CKMB -- -- -- --  TROPONINI -- -- -- --    Estimated Creatinine Clearance: 24.6 ml/min (by C-G formula based on Cr of 2.55).   Medications:  Scheduled:    . allopurinol  100 mg Oral Daily  . atorvastatin  40 mg Oral Daily  . darbepoetin (ARANESP) injection - DIALYSIS  150 mcg Intravenous Q Thu-HD  . digoxin  0.125 mg Oral Daily  . ferric gluconate (FERRLECIT/NULECIT) IV  125 mg Intravenous Q Sat-HD  . isosorbide-hydrALAZINE  1 tablet Oral TID  . levothyroxine  25 mcg Oral QAC breakfast  . multivitamin with minerals  1 tablet Oral Daily  . [COMPLETED] potassium chloride  20 mEq Oral Once  . sodium chloride  3 mL Intravenous Q12H  . traZODone  150 mg Oral QHS  . [COMPLETED] warfarin  7.5 mg Oral ONCE-1800  . Warfarin - Pharmacist Dosing Inpatient   Does not apply q1800    Assessment: 82yom continues on coumadin for afib. Heparin stopped 12/23 due to catheter site bleeding - note from 12/24 states no further oozing from catheter site. Hgb/Hct low but stable. INR is 1.87 today (from 1.62 yesterday). Will back off dose slightly today as patient has had recent bleeding and since the effects of the 7.5mg  dose have likely not yet been seen.   Was therapeutic on home dose of 3mg  daily but received vitamin  k 5mg  on 12/17, 12/18, and 12/19 for permacath placement. No coumadin given 12/21.  Goal of Therapy:  INR 2-3 Monitor platelets by anticoagulation protocol: Yes   Plan:  Coumadin 5mg  tonight at 1800 Follow up daily INR  Lillia Pauls, PharmD Clinical Pharmacist Pager: [phone] Phone: [phone] 10/18/2012 9:23 AM

## 2012-10-18 NOTE — Progress Notes (Signed)
Subjective: Interval History: none.  Objective: Vital signs in last 24 hours: Temp:  [97.6 F (36.4 C)-98.3 F (36.8 C)] 98.3 F (36.8 C) (12/25 0655) Pulse Rate:  [59-64] 64  (12/25 0655) Resp:  [18-20] 20  (12/25 0655) BP: (107-136)/(42-53) 124/44 mmHg (12/25 0655) SpO2:  [97 %-99 %] 98 % (12/25 0655) Weight:  [77.2 kg (170 lb 3.1 oz)] 77.2 kg (170 lb 3.1 oz) (12/25 0655) Weight change: -0.8 kg (-1 lb 12.2 oz)  Intake/Output from previous day: 12/24 0701 - 12/25 0700 In: 1320 [P.O.:1320] Out: -  Intake/Output this shift: Total I/O In: 240 [P.O.:240] Out: -   General appearance: cooperative and slowed mentation Neck: R IJ cath Resp: diminished breath sounds bilaterally Cardio: S1, S2 normal and systolic murmur: systolic ejection 2/6, decrescendo at 2nd left intercostal space GI: soft,pos bs, liver down 4 cm Extremities: AVF R FA, B & T  Lab Results:  [facility] 10/18/12 0455 10/17/12 0530  WBC 7.2 7.5  HGB 8.6* 8.5*  HCT 26.4* 26.4*  PLT 272 262   BMET:  Basename 10/17/12 0530 10/16/12 0703  NA 142 143  K 3.4* 3.1*  CL 107 106  CO2 28 27  GLUCOSE 99 92  BUN 24* 39*  CREATININE 2.55* 2.88*  CALCIUM 8.2* 8.2*   No results found for this basename: PTH:2 in the last 72 hours Iron Studies: No results found for this basename: IRON,TIBC,TRANSFERRIN,FERRITIN in the last 72 hours  Studies/Results: No results found.  I have reviewed the patient's current medications.  Assessment/Plan: 1 CRF for HD in am . If D/C will be at [facility] tomorrow. Vol ok 2 CM stable 3 Anemia epo/fe 4 Hypo thyroid 5 Afib  ?? Who is to manage Coumadin  P HD, epo, NHP, ??? Who is to manage Coumadin    LOS: 20 days   Geraldyne Barraclough L 10/18/2012,9:58 AM

## 2012-10-18 NOTE — Discharge Summary (Signed)
  Prior to discharge summary dictated on 10/18/2012 dictation number is [phone]

## 2012-10-18 NOTE — Discharge Summary (Signed)
NAMEJOSHA, [name] NO.:  [id number]  MEDICAL RECORD NO.:  [id number]  LOCATION:  4712                         FACILITY:  [facility]  PHYSICIAN:  Rebakah Cokley N. Sharyn Lull, M.D. DATE OF BIRTH:  03-Feb-1930  DATE OF ADMISSION:  09/28/2012 DATE OF DISCHARGE:                              DISCHARGE SUMMARY   ADMITTING DIAGNOSES:  Acute on chronic systolic heart failure, hypertension, coronary artery disease, ischemic cardiomyopathy, status post biventricular implantable cardioverter defibrillator pacer, chronic atrial fibrillation, chronic kidney disease, stage IV.  DISCHARGE DIAGNOSES: 1. Compensated systolic heart failure, coronary artery disease,     history of anteroseptal wall myocardial infarction in the past     status post percutaneous coronary intervention to left anterior     descending coronary artery in the past, ischemic dilated     cardiomyopathy status post biventricular implantable cardioverter     defibrillator. 2. Chronic atrial fibrillation. 3. Chronic kidney disease, stage IV, now on hemodialysis status post     arteriovenous fistula in the right arm. 4. Chronic left arm swelling, probably secondary to subclavian     stenosis. 5. Anemia of chronic disease. 6. Hypothyroidism. 7. Hypoalbuminemia. 8. Healing chronic right leg ulcers.  The patient is scheduled for hemodialysis at [facility] tomorrow and then Tuesday, Thursday, and Saturday.  Follow up with me in 1 week.  Follow up with Renal Service as scheduled.  The patient will be transferred to skilled nursing facility.  DISCHARGE MEDICATIONS: 1. Allopurinol 100 mg daily. 2. Amiodarone 200 mg 1 tablet twice daily. 3. Atorvastatin 40 mg 1 tablet daily. 4. Carvedilol CR 40 mg daily. 5. Cymbalta 60 mg daily. 6. BiDil one tablet 3 times daily. 7. Synthroid 25 mcg daily. 8. Multivitamin 1 tablet daily. 9. Warfarin 4 mg 1 tablet daily.  DIET:  Low-salt, low-cholesterol/renal  diet.  ACTIVITY:  Increase activity slowly as tolerated.  CONDITION AT DISCHARGE:  Stable.  Heart failure instructions have been given.  The patient will be followed up closely during hemodialysis. The patient has been advised to monitor weight, and restrict fluid intake to 1 L per 24 hours.  Condition at discharge is stable.  BRIEF HISTORY AND HOSPITAL COURSE:  [name] is an 78 year old black male with past medical history significant for coronary artery disease, history of anteroseptal wall myocardial infarction, in the past status post PCI to LAD, ischemic cardiomyopathy, history of recurrent systolic congestive heart failure, status post BiV ICD in the past, chronic atrial fibrillation, history of gouty arthritis, hypercholesteremia, history of CA of prostate, chronic kidney disease, stage IV, now on hemodialysis, chronic kidney disease, stage IV, history of CA of prostate, was admitted by Dr. Algie Coffer because of progressive leg edema and shortness of breath.  The patient denies any chest pain.  The patient denies any cough, fever, chills, and was noted to be in decompensated congestive heart failure.  PHYSICAL EXAMINATION:  VITAL SIGNS:  His blood pressure was 119/68, pulse was 59.  He was afebrile. NECK:  Supple.  Positive JVD. LUNGS:  He had bilateral expiratory wheezing. HEART:  Regular rhythm, bradycardic 2/6 systolic murmur noted. ABDOMEN:  Soft. EXTREMITIES:  2+ edema up to the knees. NEUROLOGIC:  Grossly intact.  He had 2 small warts over the right shin with nonhealing ulcers.  LABORATORY DATA:  His admission labs were sodium was 147, potassium 4.6, BUN 59, creatinine was 4.19.  His albumin was 2.6, magnesium was 2.7. Three sets of troponin-I were negative.  BNP was 23,150.  His hemoglobin was 11.7, hematocrit 36.8, white count of 5.3.  His PT was 24.8, INR was 2.37.  TSH was slightly elevated at 5.36.  Urinalysis was essentially negative.  The patient had 2D echo  done, which showed left ventricular size was mildly dilated.  There was mild concentric hypertrophy. Systolic function was in the range of 35-40%.  There was diffuse hypokinesia and there was mild aortic regurgitation.  There was moderate tricuspid regurgitation.  PA pressures were elevated to and also there was moderate tricuspid regurgitation.  BRIEF HOSPITAL COURSE:  The patient was admitted by Dr. Algie Coffer to telemetry unit and was started on IV Lasix and dobutamine, the dose was gradually increased without significant diuresis with worsening renal function.  Nephrology consultation was obtained.  The patient was started on IV Lasix drip without further improvement in his diuresis. The patient was also started on afterload reducers, but could not tolerate due to hypotension subsequently Vascular Surgical consult was obtained for access for hemodialysis and AV fistula, which he tolerated well.  The patient received few treatments of hemodialysis with improvement in his breathing and resolution of leg swelling.  The patient also had nonhealing ulcers for the right shin.  Cultures were obtained which grew Staph and Serratia and was treated appropriately with IV, initially with vancomycin which was switched to Maxipime for approximately 2 weeks.  OT/PT consultation was obtained.  The patient has been ambulating in hallway with assistance.  Inpatient rehab, initial consultation was obtained.  The patient was felt not to be the good candidate for inpatient rehab.  Discussed with family and patient regarding discharging patient home versus short-term skilled nursing facility.  Wife and patient agreed for short-term skilled nursing facility.  The patient will be discharged today to skilled nursing facility and will be followed up in my office and will monitor his PT/INR as outpatient early next week.     [name]. Sharyn Lull, M.D.     [facility]  D:  10/18/2012  T:  10/18/2012  Job:   [id number]

## 2012-10-23 ENCOUNTER — Ambulatory Visit (INDEPENDENT_AMBULATORY_CARE_PROVIDER_SITE_OTHER): Payer: Medicare Other | Admitting: *Deleted

## 2012-10-23 DIAGNOSIS — Z95810 Presence of automatic (implantable) cardiac defibrillator: Secondary | ICD-10-CM

## 2012-10-23 DIAGNOSIS — I5022 Chronic systolic (congestive) heart failure: Secondary | ICD-10-CM

## 2012-10-23 DIAGNOSIS — I2589 Other forms of chronic ischemic heart disease: Secondary | ICD-10-CM

## 2012-10-25 ENCOUNTER — Encounter: Payer: Self-pay | Admitting: Internal Medicine

## 2012-10-27 LAB — REMOTE ICD DEVICE
ATRIAL PACING ICD: 85.52 pct
BAMS-0001: 170 {beats}/min
FVT: 0
LV LEAD THRESHOLD: 0.875 V
PACEART VT: 0
RV LEAD THRESHOLD: 0.625 V
TZAT-0001ATACH: 1
TZAT-0001ATACH: 2
TZAT-0002ATACH: NEGATIVE
TZAT-0002ATACH: NEGATIVE
TZAT-0005SLOWVT: 84 pct
TZAT-0005SLOWVT: 91 pct
TZAT-0011SLOWVT: 10 ms
TZAT-0011SLOWVT: 10 ms
TZAT-0018ATACH: NEGATIVE
TZAT-0018ATACH: NEGATIVE
TZAT-0018SLOWVT: NEGATIVE
TZAT-0018SLOWVT: NEGATIVE
TZAT-0019ATACH: 6 V
TZAT-0019ATACH: 6 V
TZAT-0019ATACH: 6 V
TZAT-0019FASTVT: 8 V
TZAT-0020ATACH: 1.5 ms
TZAT-0020FASTVT: 1.5 ms
TZON-0005SLOWVT: 12
TZST-0001ATACH: 5
TZST-0001FASTVT: 3
TZST-0001FASTVT: 5
TZST-0001SLOWVT: 4
TZST-0001SLOWVT: 6
TZST-0002FASTVT: NEGATIVE
TZST-0002FASTVT: NEGATIVE
TZST-0003SLOWVT: 15 J
TZST-0003SLOWVT: 35 J
VENTRICULAR PACING ICD: 99.54 pct
VF: 0

## 2012-11-07 ENCOUNTER — Encounter: Payer: Self-pay | Admitting: *Deleted

## 2012-11-20 ENCOUNTER — Encounter: Payer: Self-pay | Admitting: Vascular Surgery

## 2012-11-21 ENCOUNTER — Ambulatory Visit (INDEPENDENT_AMBULATORY_CARE_PROVIDER_SITE_OTHER): Payer: Medicare Other | Admitting: Vascular Surgery

## 2012-11-21 ENCOUNTER — Encounter: Payer: Self-pay | Admitting: Vascular Surgery

## 2012-11-21 ENCOUNTER — Ambulatory Visit: Payer: Medicare Other | Admitting: Vascular Surgery

## 2012-11-21 VITALS — BP 134/65 | HR 60 | Resp 20 | Ht 71.0 in | Wt 160.0 lb

## 2012-11-21 DIAGNOSIS — N186 End stage renal disease: Secondary | ICD-10-CM

## 2012-11-21 DIAGNOSIS — Z48812 Encounter for surgical aftercare following surgery on the circulatory system: Secondary | ICD-10-CM | POA: Insufficient documentation

## 2012-11-21 DIAGNOSIS — Z4931 Encounter for adequacy testing for hemodialysis: Secondary | ICD-10-CM

## 2012-11-21 NOTE — Progress Notes (Signed)
Right radiocephalic AVF duplex performed @ [facility] 11/21/2012

## 2012-11-21 NOTE — Progress Notes (Signed)
Subjective:     Patient ID: [name], male   DOB: 08/24/1930, 79 y.o.   MRN: [id number]  HPI this 79-year-old male with end-stage renal disease had right radial cephalic AV fistula created by me on December 20. He is currently on hemodialysis through a right IJ catheter Tuesday Thursday and Saturday. He has had no pain or numbness in the right hand. The distal has not been utilized .  Past Medical History  Diagnosis Date  . Dyslipidemia   . CHF (congestive heart failure), NYHA class II   . Atrial fibrillation   . Prostate cancer   . Coronary artery disease   . Myocardial infarction   . Hypertension   . Depression   . Shortness of breath   . Chronic kidney disease     renal insufficiency  . ICD (implantable cardiac defibrillator) in place   . Gout     History  Substance Use Topics  . Smoking status: Former Smoker    Types: Cigars    Quit date: 10/25/1968  . Smokeless tobacco: Never Used     Comment: stopped smoking in 1970  . Alcohol Use: No    Family History  Problem Relation Age of Onset  . Diabetes Mother     No Known Allergies  Current outpatient prescriptions:allopurinol (ZYLOPRIM) 100 MG tablet, Take 100 mg by mouth daily. , Disp: , Rfl: ;  amiodarone (PACERONE) 200 MG tablet, Take 400 mg by mouth daily. , Disp: , Rfl: ;  atorvastatin (LIPITOR) 40 MG tablet, Take 40 mg by mouth daily.  , Disp: , Rfl: ;  carvedilol (COREG CR) 40 MG 24 hr capsule, Take 40 mg by mouth daily.  , Disp: , Rfl: ;  DULoxetine (CYMBALTA) 60 MG capsule, Take 60 mg by mouth daily.  , Disp: , Rfl:  levothyroxine (SYNTHROID, LEVOTHROID) 25 MCG tablet, Take 1 tablet (25 mcg total) by mouth daily before breakfast., Disp: 30 tablet, Rfl: 3;  Multiple Vitamin (MULTIVITAMINS PO), Take 1 tablet by mouth daily. , Disp: , Rfl: ;  warfarin (COUMADIN) 4 MG tablet, Take 1 tablet (4 mg total) by mouth daily., Disp: 30 tablet, Rfl: 3 isosorbide-hydrALAZINE (BIDIL) 20-37.5 MG per tablet, Take 1 tablet by  mouth 3 (three) times daily., Disp: 90 tablet, Rfl: 3  BP 134/65  Pulse 60  Resp 20  Ht 5' 11" (1.803 m)  Wt 160 lb (72.576 kg)  BMI 22.32 kg/m2  Body mass index is 22.32 kg/(m^2).           Review of Systems denies chest pain, dyspnea on exertion, PND, orthopnea, hemoptysis    Objective:   Physical Exam blood pressure 134/65 heart rate 60 respirations 20 General well-developed well-nourished thin male no apparent stress alert oriented x3 Lungs no rhonchi or wheezing Cardiovascular regular rhythm no murmurs Abdomen soft nontender with no masses Right upper extremity with well-healed distal forearm incision. Radial-cephalic AV fistula is patent with good pulse and palpable thrill. There is one prominent vein originating in the mid to distal forearm which is a competing branch connecting with the basilic system.  Today I ordered a duplex scan of the right upper extremity AV fistula. The competing branch was identified originating in the mid forearm.    Assessment:     End-stage renal disease on hemodialysis Tuesday Thursday Saturday currently with new right radial cephalic AV fistula with competing branch    Plan:     Plan ligation of competing branch right radial cephalic AV fistula   on Friday, January 31 under local anesthesia Fistula should be ready to utilize in 6 weeks      

## 2012-11-22 ENCOUNTER — Other Ambulatory Visit: Payer: Self-pay

## 2012-11-22 ENCOUNTER — Encounter (HOSPITAL_COMMUNITY): Payer: Self-pay

## 2012-11-23 ENCOUNTER — Encounter (HOSPITAL_COMMUNITY): Payer: Self-pay | Admitting: *Deleted

## 2012-11-23 MED ORDER — PENTAFLUOROPROP-TETRAFLUOROETH EX AERO: 1.0000 "application " | INHALATION_SPRAY | CUTANEOUS | Status: DC | PRN

## 2012-11-23 MED ORDER — HEPARIN SODIUM (PORCINE) 1000 UNIT/ML DIALYSIS: 100.0000 [IU]/kg | INTRAMUSCULAR | Status: DC | PRN

## 2012-11-23 MED ORDER — NEPRO/CARBSTEADY PO LIQD: 237.0000 mL | ORAL | Status: DC | PRN

## 2012-11-23 MED ORDER — HEPARIN SODIUM (PORCINE) 1000 UNIT/ML DIALYSIS: 1000.0000 [IU] | INTRAMUSCULAR | Status: DC | PRN

## 2012-11-23 MED ORDER — SODIUM CHLORIDE 0.9 % IV SOLN
INTRAVENOUS | Status: DC
  Administered 2012-11-24: 10:00:00 via INTRAVENOUS

## 2012-11-23 MED ORDER — DEXTROSE 5 % IV SOLN
1.5000 g | INTRAVENOUS | Status: AC
  Administered 2012-11-24: 1.5 g via INTRAVENOUS
  Filled 2012-11-23: qty 1.5

## 2012-11-23 MED ORDER — LIDOCAINE-PRILOCAINE 2.5-2.5 % EX CREA: 1.0000 "application " | TOPICAL_CREAM | CUTANEOUS | Status: DC | PRN

## 2012-11-23 MED ORDER — SODIUM CHLORIDE 0.9 % IV SOLN: 100.0000 mL | INTRAVENOUS | Status: DC | PRN

## 2012-11-23 MED ORDER — LIDOCAINE HCL (PF) 1 % IJ SOLN: 5.0000 mL | INTRAMUSCULAR | Status: DC | PRN

## 2012-11-23 MED ORDER — ALTEPLASE 2 MG IJ SOLR: 2.0000 mg | Freq: Once | INTRAMUSCULAR | Status: DC | PRN

## 2012-11-24 ENCOUNTER — Encounter (HOSPITAL_COMMUNITY): Payer: Self-pay | Admitting: Anesthesiology

## 2012-11-24 ENCOUNTER — Telehealth: Payer: Self-pay | Admitting: Vascular Surgery

## 2012-11-24 ENCOUNTER — Ambulatory Visit (HOSPITAL_COMMUNITY): Payer: Medicare Other

## 2012-11-24 ENCOUNTER — Encounter (HOSPITAL_COMMUNITY): Payer: Self-pay | Admitting: *Deleted

## 2012-11-24 ENCOUNTER — Ambulatory Visit (HOSPITAL_COMMUNITY): Payer: Medicare Other | Admitting: Anesthesiology

## 2012-11-24 ENCOUNTER — Encounter (HOSPITAL_COMMUNITY)
Admission: RE | Disposition: A | Discharge status: Home / Self Care | Payer: Self-pay | Source: Ambulatory Visit | Attending: Vascular Surgery

## 2012-11-24 ENCOUNTER — Ambulatory Visit (HOSPITAL_COMMUNITY)
Admission: RE | Admit: 2012-11-24 | Discharge: 2012-11-24 | Disposition: A | Discharge status: Home / Self Care | Payer: Medicare Other | Source: Ambulatory Visit | Attending: Vascular Surgery | Admitting: Vascular Surgery

## 2012-11-24 DIAGNOSIS — N186 End stage renal disease: Secondary | ICD-10-CM

## 2012-11-24 DIAGNOSIS — T82898A Other specified complication of vascular prosthetic devices, implants and grafts, initial encounter: Secondary | ICD-10-CM

## 2012-11-24 DIAGNOSIS — Z992 Dependence on renal dialysis: Secondary | ICD-10-CM | POA: Insufficient documentation

## 2012-11-24 DIAGNOSIS — E785 Hyperlipidemia, unspecified: Secondary | ICD-10-CM | POA: Insufficient documentation

## 2012-11-24 DIAGNOSIS — Z87891 Personal history of nicotine dependence: Secondary | ICD-10-CM | POA: Insufficient documentation

## 2012-11-24 DIAGNOSIS — I252 Old myocardial infarction: Secondary | ICD-10-CM | POA: Insufficient documentation

## 2012-11-24 DIAGNOSIS — I4891 Unspecified atrial fibrillation: Secondary | ICD-10-CM | POA: Insufficient documentation

## 2012-11-24 DIAGNOSIS — Z7901 Long term (current) use of anticoagulants: Secondary | ICD-10-CM | POA: Insufficient documentation

## 2012-11-24 DIAGNOSIS — M109 Gout, unspecified: Secondary | ICD-10-CM | POA: Insufficient documentation

## 2012-11-24 DIAGNOSIS — Z95810 Presence of automatic (implantable) cardiac defibrillator: Secondary | ICD-10-CM | POA: Insufficient documentation

## 2012-11-24 DIAGNOSIS — F3289 Other specified depressive episodes: Secondary | ICD-10-CM | POA: Insufficient documentation

## 2012-11-24 DIAGNOSIS — I120 Hypertensive chronic kidney disease with stage 5 chronic kidney disease or end stage renal disease: Secondary | ICD-10-CM | POA: Insufficient documentation

## 2012-11-24 DIAGNOSIS — E039 Hypothyroidism, unspecified: Secondary | ICD-10-CM | POA: Insufficient documentation

## 2012-11-24 DIAGNOSIS — I509 Heart failure, unspecified: Secondary | ICD-10-CM | POA: Insufficient documentation

## 2012-11-24 DIAGNOSIS — F329 Major depressive disorder, single episode, unspecified: Secondary | ICD-10-CM | POA: Insufficient documentation

## 2012-11-24 DIAGNOSIS — Y832 Surgical operation with anastomosis, bypass or graft as the cause of abnormal reaction of the patient, or of later complication, without mention of misadventure at the time of the procedure: Secondary | ICD-10-CM | POA: Insufficient documentation

## 2012-11-24 DIAGNOSIS — I2510 Atherosclerotic heart disease of native coronary artery without angina pectoris: Secondary | ICD-10-CM | POA: Insufficient documentation

## 2012-11-24 HISTORY — DX: Hypothyroidism, unspecified: E03.9

## 2012-11-24 HISTORY — DX: Unspecified osteoarthritis, unspecified site: M19.90

## 2012-11-24 HISTORY — PX: LIGATION OF COMPETING BRANCHES OF ARTERIOVENOUS FISTULA: SHX5949

## 2012-11-24 LAB — POCT I-STAT 4, (NA,K, GLUC, HGB,HCT)
Glucose, Bld: 84 mg/dL (ref 70–99)
HCT: 41 % (ref 39.0–52.0)
Hemoglobin: 13.9 g/dL (ref 13.0–17.0)

## 2012-11-24 SURGERY — LIGATION OF COMPETING BRANCHES OF ARTERIOVENOUS FISTULA
Anesthesia: Monitor Anesthesia Care | Site: Arm Lower | Laterality: Right | Wound class: Clean

## 2012-11-24 MED ORDER — OXYCODONE HCL 5 MG PO TABS: 5.0000 mg | ORAL_TABLET | ORAL | Status: DC | PRN

## 2012-11-24 MED ORDER — PROPOFOL INFUSION 10 MG/ML OPTIME
INTRAVENOUS | Status: DC | PRN
  Administered 2012-11-24: 25 ug/kg/min via INTRAVENOUS

## 2012-11-24 MED ORDER — ONDANSETRON HCL 4 MG/2ML IJ SOLN
INTRAMUSCULAR | Status: DC | PRN
  Administered 2012-11-24: 4 mg via INTRAVENOUS

## 2012-11-24 MED ORDER — MUPIROCIN 2 % EX OINT: TOPICAL_OINTMENT | Freq: Two times a day (BID) | CUTANEOUS | Status: DC

## 2012-11-24 MED ORDER — SODIUM CHLORIDE 0.9 % IR SOLN
Status: DC | PRN
  Administered 2012-11-24: 11:00:00

## 2012-11-24 MED ORDER — ONDANSETRON HCL 4 MG/2ML IJ SOLN: 4.0000 mg | Freq: Once | INTRAMUSCULAR | Status: DC | PRN

## 2012-11-24 MED ORDER — HYDROMORPHONE HCL PF 1 MG/ML IJ SOLN: 0.2500 mg | INTRAMUSCULAR | Status: DC | PRN

## 2012-11-24 MED ORDER — SODIUM CHLORIDE 0.9 % IV SOLN
INTRAVENOUS | Status: DC | PRN
  Administered 2012-11-24: 10:00:00 via INTRAVENOUS

## 2012-11-24 MED ORDER — 0.9 % SODIUM CHLORIDE (POUR BTL) OPTIME
TOPICAL | Status: DC | PRN
  Administered 2012-11-24: 1000 mL

## 2012-11-24 MED ORDER — PHENYLEPHRINE HCL 10 MG/ML IJ SOLN
INTRAMUSCULAR | Status: DC | PRN
  Administered 2012-11-24 (×4): 80 ug via INTRAVENOUS

## 2012-11-24 MED ORDER — FENTANYL CITRATE 0.05 MG/ML IJ SOLN
INTRAMUSCULAR | Status: DC | PRN
  Administered 2012-11-24: 50 ug via INTRAVENOUS
  Administered 2012-11-24 (×2): 25 ug via INTRAVENOUS

## 2012-11-24 MED ORDER — PROPOFOL 10 MG/ML IV BOLUS
INTRAVENOUS | Status: DC | PRN
  Administered 2012-11-24 (×2): 20 mg via INTRAVENOUS

## 2012-11-24 MED ORDER — MIDAZOLAM HCL 5 MG/5ML IJ SOLN
INTRAMUSCULAR | Status: DC | PRN
  Administered 2012-11-24: 1 mg via INTRAVENOUS

## 2012-11-24 MED ORDER — EPHEDRINE SULFATE 50 MG/ML IJ SOLN
INTRAMUSCULAR | Status: DC | PRN
  Administered 2012-11-24 (×8): 10 mg via INTRAVENOUS

## 2012-11-24 MED ORDER — MUPIROCIN 2 % EX OINT
TOPICAL_OINTMENT | CUTANEOUS | Status: AC
  Administered 2012-11-24: 1 via NASAL
  Filled 2012-11-24: qty 22

## 2012-11-24 MED ORDER — LIDOCAINE HCL (CARDIAC) 20 MG/ML IV SOLN
INTRAVENOUS | Status: DC | PRN
  Administered 2012-11-24: 60 mg via INTRAVENOUS

## 2012-11-24 MED ORDER — LIDOCAINE-EPINEPHRINE 0.5 %-1:200000 IJ SOLN
INTRAMUSCULAR | Status: DC | PRN
  Administered 2012-11-24: 50 mL

## 2012-11-24 MED ORDER — LIDOCAINE-EPINEPHRINE 0.5 %-1:200000 IJ SOLN
INTRAMUSCULAR | Status: AC
  Filled 2012-11-24: qty 1

## 2012-11-24 SURGICAL SUPPLY — 37 items
APL SKNCLS STERI-STRIP NONHPOA (GAUZE/BANDAGES/DRESSINGS) ×1
BENZOIN TINCTURE PRP APPL 2/3 (GAUZE/BANDAGES/DRESSINGS) ×2 IMPLANT
CANISTER SUCTION 2500CC (MISCELLANEOUS) ×2 IMPLANT
CLIP LIGATING EXTRA MED SLVR (CLIP) ×2 IMPLANT
CLIP LIGATING EXTRA SM BLUE (MISCELLANEOUS) ×2 IMPLANT
CLOTH BEACON ORANGE TIMEOUT ST (SAFETY) ×2 IMPLANT
COVER PROBE W GEL 5X96 (DRAPES) ×1 IMPLANT
COVER SURGICAL LIGHT HANDLE (MISCELLANEOUS) ×2 IMPLANT
DECANTER SPIKE VIAL GLASS SM (MISCELLANEOUS) ×2 IMPLANT
ELECT REM PT RETURN 9FT ADLT (ELECTROSURGICAL) ×2
ELECTRODE REM PT RTRN 9FT ADLT (ELECTROSURGICAL) ×1 IMPLANT
GEL ULTRASOUND 20GR AQUASONIC (MISCELLANEOUS) IMPLANT
GLOVE BIOGEL PI IND STRL 6 (GLOVE) IMPLANT
GLOVE BIOGEL PI IND STRL 7.0 (GLOVE) IMPLANT
GLOVE BIOGEL PI INDICATOR 6 (GLOVE) ×2
GLOVE BIOGEL PI INDICATOR 7.0 (GLOVE) ×2
GLOVE SS BIOGEL STRL SZ 7.5 (GLOVE) ×1 IMPLANT
GLOVE SUPERSENSE BIOGEL SZ 7.5 (GLOVE) ×1
GOWN STRL NON-REIN LRG LVL3 (GOWN DISPOSABLE) ×5 IMPLANT
KIT BASIN OR (CUSTOM PROCEDURE TRAY) ×2 IMPLANT
KIT ROOM TURNOVER OR (KITS) ×2 IMPLANT
NS IRRIG 1000ML POUR BTL (IV SOLUTION) ×2 IMPLANT
PACK CV ACCESS (CUSTOM PROCEDURE TRAY) ×2 IMPLANT
PAD ARMBOARD 7.5X6 YLW CONV (MISCELLANEOUS) ×4 IMPLANT
SPONGE GAUZE 4X4 12PLY (GAUZE/BANDAGES/DRESSINGS) ×2 IMPLANT
STAPLER VISISTAT 35W (STAPLE) IMPLANT
STRIP CLOSURE SKIN 1/2X4 (GAUZE/BANDAGES/DRESSINGS) ×2 IMPLANT
SUT ETHILON 3 0 PS 1 (SUTURE) IMPLANT
SUT PROLENE 6 0 CC (SUTURE) IMPLANT
SUT SILK 0 TIES 10X30 (SUTURE) ×2 IMPLANT
SUT VIC AB 3-0 SH 27 (SUTURE) ×2
SUT VIC AB 3-0 SH 27X BRD (SUTURE) ×1 IMPLANT
SUT VICRYL 4-0 PS2 18IN ABS (SUTURE) ×1 IMPLANT
TOWEL OR 17X24 6PK STRL BLUE (TOWEL DISPOSABLE) ×2 IMPLANT
TOWEL OR 17X26 10 PK STRL BLUE (TOWEL DISPOSABLE) ×2 IMPLANT
UNDERPAD 30X30 INCONTINENT (UNDERPADS AND DIAPERS) ×2 IMPLANT
WATER STERILE IRR 1000ML POUR (IV SOLUTION) ×2 IMPLANT

## 2012-11-24 NOTE — Anesthesia Postprocedure Evaluation (Signed)
  Anesthesia Post-op Note  Patient: [name]  Procedure(s) Performed: Procedure(s) (LRB) with comments: LIGATION OF COMPETING BRANCHES OF ARTERIOVENOUS FISTULA (Right) - ligation of competing branch right arteriovenous fistula  Patient Location: PACU  Anesthesia Type:MAC  Level of Consciousness: awake, oriented, sedated and patient cooperative  Airway and Oxygen Therapy: Patient Spontanous Breathing  Post-op Pain: none  Post-op Assessment: Post-op Vital signs reviewed, Patient's Cardiovascular Status Stable, Respiratory Function Stable, Patent Airway, No signs of Nausea or vomiting and Pain level controlled  Post-op Vital Signs: stable  Complications: No apparent anesthesia complications

## 2012-11-24 NOTE — Op Note (Signed)
OPERATIVE REPORT  DATE OF SURGERY: 11/24/2012  PATIENT: [name], 79 y.o. male MRN: [id number]  DOB: [birth year]  PRE-OPERATIVE DIAGNOSIS: End-stage renal disease with competing branch of left arm AV fistula  POST-OPERATIVE DIAGNOSIS:  Same  PROCEDURE: Ligation of 2 competing branches of left arm AV fistula  SURGEON:  Gretta Began, M.D.    ANESTHESIA:  Local with sedation  EBL: Minimal ml  Total I/O In: 400 [I.V.:400] Out: -   BLOOD ADMINISTERED: None  DRAINS: None  SPECIMEN: None  COUNTS CORRECT:  YES  PLAN OF CARE: PACU   PATIENT DISPOSITION:  PACU - hemodynamically stable  PROCEDURE DETAILS: The patient was taken to the upper and placed supine position where the area of the left arm draped in the sterile fashion. Using SonoSite ultrasound 2 areas of competing branches off the forearm cephalic vein fistula were identified and marked. Using local anesthesia a small incision was made over each of these and the vein was ligated with a 3-0 silk tie. The wounds were irrigated with saline. Hemostasis obtained with cautery. Wounds were closed with 4-0 subcuticular Vicryl stitch. Sterile dressing was applied   Gretta Began, M.D. 11/24/2012 11:30 AM

## 2012-11-24 NOTE — Telephone Encounter (Addendum)
Message copied by Rosalyn Charters on Fri Nov 24, 2012 12:08 PM ------      Message from: Melene Plan      Created: Fri Nov 24, 2012 11:31 AM                   ----- Message -----         From: [place], [place]         Sent: 11/24/2012  11:12 AM           To: Melene Plan, RN            4 week F/U AVF - lawson notified patient of fu appt. with dr. Hart [place] 12-26-12 10:15 am

## 2012-11-24 NOTE — Anesthesia Preprocedure Evaluation (Signed)
Anesthesia Evaluation  Patient identified by MRN, date of birth, ID band Patient awake    Reviewed: Allergy & Precautions, H&P , NPO status , Patient's Chart, lab work & pertinent test results  Airway Mallampati: I TM Distance: >3 FB Neck ROM: full    Dental   Pulmonary          Cardiovascular hypertension, + CAD, + Past MI and +CHF + dysrhythmias Atrial Fibrillation + Cardiac Defibrillator Rhythm:irregular Rate:Tachycardia     Neuro/Psych Depression    GI/Hepatic   Endo/Other  Hypothyroidism   Renal/GU Dialysis and ESRFRenal disease     Musculoskeletal   Abdominal   Peds  Hematology   Anesthesia Other Findings   Reproductive/Obstetrics                           Anesthesia Physical Anesthesia Plan  ASA: IV  Anesthesia Plan: MAC   Post-op Pain Management:    Induction: Intravenous  Airway Management Planned: Simple Face Mask  Additional Equipment:   Intra-op Plan:   Post-operative Plan:   Informed Consent: I have reviewed the patients History and Physical, chart, labs and discussed the procedure including the risks, benefits and alternatives for the proposed anesthesia with the patient or authorized representative who has indicated his/her understanding and acceptance.     Plan Discussed with: CRNA, Anesthesiologist and Surgeon  Anesthesia Plan Comments:         Anesthesia Quick Evaluation

## 2012-11-24 NOTE — Preoperative (Signed)
Beta Blockers   Reason not to administer Beta Blockers:Not Applicable 

## 2012-11-24 NOTE — H&P (View-Only) (Signed)
Subjective:     Patient ID: [name], male   DOB: [birth year], 79 y.o.   MRN: [id number]  HPI this 79 year old male with end-stage renal disease had right radial cephalic AV fistula created by me on December 20. He is currently on hemodialysis through a right IJ catheter Tuesday Thursday and Saturday. He has had no pain or numbness in the right hand. The distal has not been utilized .  Past Medical History  Diagnosis Date  . Dyslipidemia   . CHF (congestive heart failure), NYHA class II   . Atrial fibrillation   . Prostate cancer   . Coronary artery disease   . Myocardial infarction   . Hypertension   . Depression   . Shortness of breath   . Chronic kidney disease     renal insufficiency  . ICD (implantable cardiac defibrillator) in place   . Gout     History  Substance Use Topics  . Smoking status: Former Smoker    Types: Cigars    Quit date: 10/25/1968  . Smokeless tobacco: Never Used     Comment: stopped smoking in 1970  . Alcohol Use: No    Family History  Problem Relation Age of Onset  . Diabetes Mother     No Known Allergies  Current outpatient prescriptions:allopurinol (ZYLOPRIM) 100 MG tablet, Take 100 mg by mouth daily. , Disp: , Rfl: ;  amiodarone (PACERONE) 200 MG tablet, Take 400 mg by mouth daily. , Disp: , Rfl: ;  atorvastatin (LIPITOR) 40 MG tablet, Take 40 mg by mouth daily.  , Disp: , Rfl: ;  carvedilol (COREG CR) 40 MG 24 hr capsule, Take 40 mg by mouth daily.  , Disp: , Rfl: ;  DULoxetine (CYMBALTA) 60 MG capsule, Take 60 mg by mouth daily.  , Disp: , Rfl:  levothyroxine (SYNTHROID, LEVOTHROID) 25 MCG tablet, Take 1 tablet (25 mcg total) by mouth daily before breakfast., Disp: 30 tablet, Rfl: 3;  Multiple Vitamin (MULTIVITAMINS PO), Take 1 tablet by mouth daily. , Disp: , Rfl: ;  warfarin (COUMADIN) 4 MG tablet, Take 1 tablet (4 mg total) by mouth daily., Disp: 30 tablet, Rfl: 3 isosorbide-hydrALAZINE (BIDIL) 20-37.5 MG per tablet, Take 1 tablet by  mouth 3 (three) times daily., Disp: 90 tablet, Rfl: 3  BP 134/65  Pulse 60  Resp 20  Ht 5\' 11"  (1.803 m)  Wt 160 lb (72.576 kg)  BMI 22.32 kg/m2  Body mass index is 22.32 kg/(m^2).           Review of Systems denies chest pain, dyspnea on exertion, PND, orthopnea, hemoptysis    Objective:   Physical Exam blood pressure 134/65 heart rate 60 respirations 20 General well-developed well-nourished thin male no apparent stress alert oriented x3 Lungs no rhonchi or wheezing Cardiovascular regular rhythm no murmurs Abdomen soft nontender with no masses Right upper extremity with well-healed distal forearm incision. Radial-cephalic AV fistula is patent with good pulse and palpable thrill. There is one prominent vein originating in the mid to distal forearm which is a competing branch connecting with the basilic system.  Today I ordered a duplex scan of the right upper extremity AV fistula. The competing branch was identified originating in the mid forearm.    Assessment:     End-stage renal disease on hemodialysis Tuesday Thursday Saturday currently with new right radial cephalic AV fistula with competing branch    Plan:     Plan ligation of competing branch right radial cephalic AV fistula  on Friday, January 31 under local anesthesia Fistula should be ready to utilize in 6 weeks

## 2012-11-24 NOTE — Interval H&P Note (Signed)
History and Physical Interval Note:  11/24/2012 10:18 AM  [name]  has presented today for surgery, with the diagnosis of Complications of Arteriovenous Fistula End Stage Renal Disease  The various methods of treatment have been discussed with the patient and family. After consideration of risks, benefits and other options for treatment, the patient has consented to  Procedure(s) (LRB) with comments: LIGATION OF COMPETING BRANCHES OF ARTERIOVENOUS FISTULA (Right) - ligation of competing branch right arteriovenous fistula as a surgical intervention .  The patient's history has been reviewed, patient examined, no change in status, stable for surgery.  I have reviewed the patient's chart and labs.  Questions were answered to the patient's satisfaction.     [name]

## 2012-11-24 NOTE — Transfer of Care (Signed)
Immediate Anesthesia Transfer of Care Note  Patient: [name]  Procedure(s) Performed: Procedure(s) (LRB) with comments: LIGATION OF COMPETING BRANCHES OF ARTERIOVENOUS FISTULA (Right) - ligation of competing branch right arteriovenous fistula  Patient Location: PACU  Anesthesia Type:MAC  Level of Consciousness: awake, sedated and unresponsive  Airway & Oxygen Therapy: Patient Spontanous Breathing and Patient connected to face mask oxygen  Post-op Assessment: Report given to PACU RN, Post -op Vital signs reviewed and stable and Patient moving all extremities  Post vital signs: Reviewed and stable  Complications: No apparent anesthesia complications

## 2012-11-27 ENCOUNTER — Encounter (HOSPITAL_COMMUNITY): Payer: Self-pay | Admitting: Vascular Surgery

## 2012-12-26 ENCOUNTER — Ambulatory Visit: Payer: Medicare Other | Admitting: Vascular Surgery

## 2013-01-01 ENCOUNTER — Encounter: Payer: Self-pay | Admitting: Vascular Surgery

## 2013-01-02 ENCOUNTER — Encounter (HOSPITAL_COMMUNITY): Payer: Self-pay | Admitting: Pharmacy Technician

## 2013-01-02 ENCOUNTER — Ambulatory Visit (INDEPENDENT_AMBULATORY_CARE_PROVIDER_SITE_OTHER): Payer: Medicare Other | Admitting: Vascular Surgery

## 2013-01-02 ENCOUNTER — Encounter (INDEPENDENT_AMBULATORY_CARE_PROVIDER_SITE_OTHER): Payer: Medicare Other | Admitting: *Deleted

## 2013-01-02 ENCOUNTER — Encounter: Payer: Self-pay | Admitting: Vascular Surgery

## 2013-01-02 ENCOUNTER — Encounter (HOSPITAL_COMMUNITY): Payer: Self-pay

## 2013-01-02 ENCOUNTER — Other Ambulatory Visit: Payer: Self-pay

## 2013-01-02 VITALS — BP 125/72 | HR 60 | Resp 20 | Ht 71.0 in | Wt 160.0 lb

## 2013-01-02 DIAGNOSIS — N186 End stage renal disease: Secondary | ICD-10-CM

## 2013-01-02 MED ORDER — DEXTROSE 5 % IV SOLN
1.5000 g | INTRAVENOUS | Status: AC
  Administered 2013-01-08: 1.5 g via INTRAVENOUS
  Filled 2013-01-02 (×2): qty 1.5

## 2013-01-02 NOTE — Progress Notes (Signed)
Subjective:     Patient ID: [name], male   DOB: 04-Feb-1930, 79 y.o.   MRN: [id number]  HPI this 79 year old male with end-stage renal disease currently on hemodialysis. He had a right radial cephalic AV fistula created by me 10/13/2012. He has not matured satisfactorily. He did have ligation of 2 competing branches performed by Dr. Arbie Cookey about 4-6 weeks ago. Today he returns for further evaluation. He denies any pain or numbness in his right hand.   Review of Systems     Objective:   Physical Exam BP 125/72  Pulse 60  Resp 20  Ht 5\' 11"  (1.803 m)  Wt 160 lb (72.576 kg)  BMI 22.33 kg/m2  General well-developed well-nourished thin male in no apparent stress alert and oriented x3 Right upper extremity with patent radial cephalic fistula with very poor flow and a weak pulse and very soft bruit right hand is well perfused Left upper extremity with good brachial and radial pulse at 2-3+  Today I ordered a fistulogram of the right radial-cephalic AV fistula which are reviewed and interpreted.the right cephalic vein is small caliber in the lower third of the forearm. There is not appear to be a focal stenosis. There is very slow flow in the fistula in the upper arm cephalic vein is also small caliber.      Assessment:     Right radial cephalic AV fistula is patent but do not believe this will mature into usable access-needs new access     Plan:     Plan creation left brachial cephalic AV fistula tomorrow March 12. Possibility of basilic vein transposition but this is unlikely. Discussed this with patient and his wife and they are agreeable

## 2013-01-02 NOTE — Progress Notes (Signed)
01/02/13 1244  OBSTRUCTIVE SLEEP APNEA  Have you ever been diagnosed with sleep apnea through a sleep study? No  Do you snore loudly (loud enough to be heard through closed doors)?  0  Do you often feel tired, fatigued, or sleepy during the daytime? 1  Has anyone observed you stop breathing during your sleep? 0  Do you have, or are you being treated for high blood pressure? 1  BMI more than 35 kg/m2? 0  Age over 77 years old? 1  Neck circumference greater than 40 cm/18 inches? 0  Gender: 1  Obstructive Sleep Apnea Score 4  Score 4 or greater  Results sent to PCP

## 2013-01-08 ENCOUNTER — Encounter (HOSPITAL_COMMUNITY): Payer: Self-pay | Admitting: Anesthesiology

## 2013-01-08 ENCOUNTER — Ambulatory Visit (HOSPITAL_COMMUNITY)
Admission: RE | Admit: 2013-01-08 | Discharge: 2013-01-08 | Disposition: A | Discharge status: Home / Self Care | Payer: Medicare Other | Source: Ambulatory Visit | Attending: Vascular Surgery | Admitting: Vascular Surgery

## 2013-01-08 ENCOUNTER — Encounter (HOSPITAL_COMMUNITY)
Admission: RE | Disposition: A | Discharge status: Home / Self Care | Payer: Self-pay | Source: Ambulatory Visit | Attending: Vascular Surgery

## 2013-01-08 ENCOUNTER — Ambulatory Visit (HOSPITAL_COMMUNITY): Payer: Medicare Other | Admitting: Anesthesiology

## 2013-01-08 ENCOUNTER — Telehealth: Payer: Self-pay | Admitting: Vascular Surgery

## 2013-01-08 ENCOUNTER — Other Ambulatory Visit: Payer: Self-pay

## 2013-01-08 ENCOUNTER — Encounter (HOSPITAL_COMMUNITY): Payer: Self-pay | Admitting: *Deleted

## 2013-01-08 DIAGNOSIS — E039 Hypothyroidism, unspecified: Secondary | ICD-10-CM | POA: Insufficient documentation

## 2013-01-08 DIAGNOSIS — N186 End stage renal disease: Secondary | ICD-10-CM

## 2013-01-08 DIAGNOSIS — I509 Heart failure, unspecified: Secondary | ICD-10-CM | POA: Insufficient documentation

## 2013-01-08 DIAGNOSIS — Z95810 Presence of automatic (implantable) cardiac defibrillator: Secondary | ICD-10-CM | POA: Insufficient documentation

## 2013-01-08 DIAGNOSIS — I252 Old myocardial infarction: Secondary | ICD-10-CM | POA: Insufficient documentation

## 2013-01-08 DIAGNOSIS — I059 Rheumatic mitral valve disease, unspecified: Secondary | ICD-10-CM | POA: Insufficient documentation

## 2013-01-08 DIAGNOSIS — I2510 Atherosclerotic heart disease of native coronary artery without angina pectoris: Secondary | ICD-10-CM | POA: Insufficient documentation

## 2013-01-08 DIAGNOSIS — I120 Hypertensive chronic kidney disease with stage 5 chronic kidney disease or end stage renal disease: Secondary | ICD-10-CM | POA: Insufficient documentation

## 2013-01-08 DIAGNOSIS — I4891 Unspecified atrial fibrillation: Secondary | ICD-10-CM | POA: Insufficient documentation

## 2013-01-08 HISTORY — PX: AV FISTULA PLACEMENT: SHX1204

## 2013-01-08 LAB — POCT I-STAT 4, (NA,K, GLUC, HGB,HCT)
Glucose, Bld: 88 mg/dL (ref 70–99)
HCT: 37 % — ABNORMAL LOW (ref 39.0–52.0)
Potassium: 3.9 mEq/L (ref 3.5–5.1)
Sodium: 142 mEq/L (ref 135–145)

## 2013-01-08 LAB — SURGICAL PCR SCREEN: Staphylococcus aureus: POSITIVE — AB

## 2013-01-08 SURGERY — ARTERIOVENOUS (AV) FISTULA CREATION
Anesthesia: Monitor Anesthesia Care | Site: Arm Upper | Laterality: Left | Wound class: Clean

## 2013-01-08 MED ORDER — SODIUM CHLORIDE 0.9 % IV SOLN
10.0000 mg | INTRAVENOUS | Status: DC | PRN
  Administered 2013-01-08: 40 ug/min via INTRAVENOUS

## 2013-01-08 MED ORDER — HEPARIN SODIUM (PORCINE) 1000 UNIT/ML IJ SOLN
INTRAMUSCULAR | Status: DC | PRN
  Administered 2013-01-08: 5000 [IU] via INTRAVENOUS

## 2013-01-08 MED ORDER — MUPIROCIN 2 % EX OINT
TOPICAL_OINTMENT | CUTANEOUS | Status: AC
  Filled 2013-01-08: qty 22

## 2013-01-08 MED ORDER — SODIUM CHLORIDE 0.9 % IV SOLN
INTRAVENOUS | Status: DC
  Administered 2013-01-08 (×2): via INTRAVENOUS

## 2013-01-08 MED ORDER — LIDOCAINE HCL (PF) 1 % IJ SOLN
INTRAMUSCULAR | Status: DC | PRN
  Administered 2013-01-08: 10 mL

## 2013-01-08 MED ORDER — ACETAMINOPHEN-CODEINE #3 300-30 MG PO TABS: 1.0000 | ORAL_TABLET | ORAL | Status: DC | PRN

## 2013-01-08 MED ORDER — ACETAMINOPHEN-CODEINE #2 300-15 MG PO TABS: 1.0000 | ORAL_TABLET | ORAL | Status: DC | PRN

## 2013-01-08 MED ORDER — PHENYLEPHRINE HCL 10 MG/ML IJ SOLN
INTRAMUSCULAR | Status: DC | PRN
  Administered 2013-01-08: 80 ug via INTRAVENOUS
  Administered 2013-01-08 (×2): 120 ug via INTRAVENOUS
  Administered 2013-01-08: 80 ug via INTRAVENOUS

## 2013-01-08 MED ORDER — MIDAZOLAM HCL 5 MG/5ML IJ SOLN
INTRAMUSCULAR | Status: DC | PRN
  Administered 2013-01-08: 1 mg via INTRAVENOUS

## 2013-01-08 MED ORDER — METOCLOPRAMIDE HCL 5 MG/ML IJ SOLN: 10.0000 mg | Freq: Once | INTRAMUSCULAR | Status: DC | PRN

## 2013-01-08 MED ORDER — FENTANYL CITRATE 0.05 MG/ML IJ SOLN: 25.0000 ug | INTRAMUSCULAR | Status: DC | PRN

## 2013-01-08 MED ORDER — LIDOCAINE HCL (PF) 1 % IJ SOLN
INTRAMUSCULAR | Status: AC
  Filled 2013-01-08: qty 30

## 2013-01-08 MED ORDER — FENTANYL CITRATE 0.05 MG/ML IJ SOLN
INTRAMUSCULAR | Status: DC | PRN
  Administered 2013-01-08: 50 ug via INTRAVENOUS

## 2013-01-08 MED ORDER — ONDANSETRON HCL 4 MG/2ML IJ SOLN
INTRAMUSCULAR | Status: DC | PRN
  Administered 2013-01-08: 4 mg via INTRAVENOUS

## 2013-01-08 MED ORDER — EPHEDRINE SULFATE 50 MG/ML IJ SOLN
INTRAMUSCULAR | Status: DC | PRN
  Administered 2013-01-08 (×2): 10 mg via INTRAVENOUS
  Administered 2013-01-08: 20 mg via INTRAVENOUS

## 2013-01-08 MED ORDER — MUPIROCIN 2 % EX OINT
TOPICAL_OINTMENT | Freq: Two times a day (BID) | CUTANEOUS | Status: DC
  Filled 2013-01-08: qty 22

## 2013-01-08 MED ORDER — 0.9 % SODIUM CHLORIDE (POUR BTL) OPTIME
TOPICAL | Status: DC | PRN
  Administered 2013-01-08: 1000 mL

## 2013-01-08 MED ORDER — SODIUM CHLORIDE 0.9 % IR SOLN
Status: DC | PRN
  Administered 2013-01-08: 08:00:00

## 2013-01-08 MED ORDER — PROPOFOL INFUSION 10 MG/ML OPTIME
INTRAVENOUS | Status: DC | PRN
  Administered 2013-01-08: 75 ug/kg/min via INTRAVENOUS

## 2013-01-08 SURGICAL SUPPLY — 38 items
ADH SKN CLS APL DERMABOND .7 (GAUZE/BANDAGES/DRESSINGS) ×1
CANISTER SUCTION 2500CC (MISCELLANEOUS) ×2 IMPLANT
CLIP TI MEDIUM 6 (CLIP) ×2 IMPLANT
CLIP TI WIDE RED SMALL 6 (CLIP) ×2 IMPLANT
CLOTH BEACON ORANGE TIMEOUT ST (SAFETY) ×2 IMPLANT
COVER PROBE W GEL 5X96 (DRAPES) ×2 IMPLANT
COVER SURGICAL LIGHT HANDLE (MISCELLANEOUS) ×2 IMPLANT
DECANTER SPIKE VIAL GLASS SM (MISCELLANEOUS) ×2 IMPLANT
DERMABOND ADVANCED (GAUZE/BANDAGES/DRESSINGS) ×1
DERMABOND ADVANCED .7 DNX12 (GAUZE/BANDAGES/DRESSINGS) ×1 IMPLANT
DRAIN PENROSE 1/4X12 LTX STRL (WOUND CARE) ×2 IMPLANT
ELECT REM PT RETURN 9FT ADLT (ELECTROSURGICAL) ×2
ELECTRODE REM PT RTRN 9FT ADLT (ELECTROSURGICAL) ×1 IMPLANT
GEL ULTRASOUND 20GR AQUASONIC (MISCELLANEOUS) IMPLANT
GLOVE BIO SURGEON STRL SZ 6.5 (GLOVE) ×3 IMPLANT
GLOVE BIO SURGEON STRL SZ7.5 (GLOVE) ×2 IMPLANT
GLOVE BIO SURGEON STRL SZ8.5 (GLOVE) ×1 IMPLANT
GLOVE BIOGEL PI IND STRL 7.5 (GLOVE) IMPLANT
GLOVE BIOGEL PI INDICATOR 7.5 (GLOVE) ×1
GLOVE SURG SS PI 6.5 STRL IVOR (GLOVE) ×2 IMPLANT
GOWN PREVENTION PLUS XLARGE (GOWN DISPOSABLE) ×2 IMPLANT
GOWN STRL NON-REIN LRG LVL3 (GOWN DISPOSABLE) ×6 IMPLANT
KIT BASIN OR (CUSTOM PROCEDURE TRAY) ×2 IMPLANT
KIT ROOM TURNOVER OR (KITS) ×2 IMPLANT
LOOP VESSEL MAXI BLUE (MISCELLANEOUS) ×1 IMPLANT
LOOP VESSEL MINI RED (MISCELLANEOUS) IMPLANT
NS IRRIG 1000ML POUR BTL (IV SOLUTION) ×2 IMPLANT
PACK CV ACCESS (CUSTOM PROCEDURE TRAY) ×2 IMPLANT
PAD ARMBOARD 7.5X6 YLW CONV (MISCELLANEOUS) ×4 IMPLANT
SPONGE SURGIFOAM ABS GEL 100 (HEMOSTASIS) IMPLANT
SUT PROLENE 7 0 BV 1 (SUTURE) ×3 IMPLANT
SUT VIC AB 3-0 SH 27 (SUTURE) ×2
SUT VIC AB 3-0 SH 27X BRD (SUTURE) ×1 IMPLANT
SUT VICRYL 4-0 PS2 18IN ABS (SUTURE) ×2 IMPLANT
TOWEL OR 17X24 6PK STRL BLUE (TOWEL DISPOSABLE) ×2 IMPLANT
TOWEL OR 17X26 10 PK STRL BLUE (TOWEL DISPOSABLE) ×2 IMPLANT
UNDERPAD 30X30 INCONTINENT (UNDERPADS AND DIAPERS) ×2 IMPLANT
WATER STERILE IRR 1000ML POUR (IV SOLUTION) ×2 IMPLANT

## 2013-01-08 NOTE — Telephone Encounter (Signed)
Jacob Apa- left brachiocephalic AVF [name] asst.  Needs follow up appt in 1 month to recheck fistula            [name] (as per staff message dated 01/08/13)  01/08/13: mailed letter to home address- unable to reach patient by phone, dpm

## 2013-01-08 NOTE — Preoperative (Signed)
Beta Blockers   Reason not to administer Beta Blockers:Not Applicable 

## 2013-01-08 NOTE — H&P (View-Only) (Signed)
Subjective:     Patient ID: [name], male   DOB: [birth year], 79 y.o.   MRN: [id number]  HPI this 79 year old male with end-stage renal disease currently on hemodialysis. He had a right radial cephalic AV fistula created by me 10/13/2012. He has not matured satisfactorily. He did have ligation of 2 competing branches performed by Dr. Arbie Cookey about 4-6 weeks ago. Today he returns for further evaluation. He denies any pain or numbness in his right hand.   Review of Systems     Objective:   Physical Exam BP 125/72  Pulse 60  Resp 20  Ht 5\' 11"  (1.803 m)  Wt 160 lb (72.576 kg)  BMI 22.33 kg/m2  General well-developed well-nourished thin male in no apparent stress alert and oriented x3 Right upper extremity with patent radial cephalic fistula with very poor flow and a weak pulse and very soft bruit right hand is well perfused Left upper extremity with good brachial and radial pulse at 2-3+  Today I ordered a fistulogram of the right radial-cephalic AV fistula which are reviewed and interpreted.the right cephalic vein is small caliber in the lower third of the forearm. There is not appear to be a focal stenosis. There is very slow flow in the fistula in the upper arm cephalic vein is also small caliber.      Assessment:     Right radial cephalic AV fistula is patent but do not believe this will mature into usable access-needs new access     Plan:     Plan creation left brachial cephalic AV fistula tomorrow March 12. Possibility of basilic vein transposition but this is unlikely. Discussed this with patient and his wife and they are agreeable

## 2013-01-08 NOTE — Anesthesia Postprocedure Evaluation (Signed)
Anesthesia Post Note  Patient: [name]  Procedure(s) Performed: Procedure(s) (LRB): ARTERIOVENOUS (AV) FISTULA CREATION (Left)  Anesthesia type: MAC  Patient location: PACU  Post pain: Pain level controlled  Post assessment: Patient's Cardiovascular Status Stable  Last Vitals:  Filed Vitals:   01/08/13 0956  BP: 101/43  Pulse: 60  Temp: 36.6 C  Resp: 18    Post vital signs: Reviewed and stable  Level of consciousness: alert  Complications: No apparent anesthesia complications

## 2013-01-08 NOTE — Anesthesia Preprocedure Evaluation (Signed)
Anesthesia Evaluation  Patient identified by MRN, date of birth, ID band Patient awake    Reviewed: Allergy & Precautions, H&P , NPO status , Patient's Chart, lab work & pertinent test results, reviewed documented beta blocker date and time   Airway Mallampati: II TM Distance: >3 FB Neck ROM: full    Dental   Pulmonary shortness of breath and with exertion,  breath sounds clear to auscultation        Cardiovascular hypertension, On Medications and On Home Beta Blockers + CAD, + Past MI and +CHF + dysrhythmias Atrial Fibrillation + Cardiac Defibrillator + Valvular Problems/Murmurs MR Rhythm:regular     Neuro/Psych PSYCHIATRIC DISORDERS negative neurological ROS     GI/Hepatic negative GI ROS, Neg liver ROS,   Endo/Other  Hypothyroidism   Renal/GU DialysisRenal disease  negative genitourinary   Musculoskeletal   Abdominal   Peds  Hematology negative hematology ROS (+)   Anesthesia Other Findings See surgeon's H&P   Reproductive/Obstetrics negative OB ROS                           Anesthesia Physical Anesthesia Plan  ASA: III  Anesthesia Plan: MAC   Post-op Pain Management:    Induction: Intravenous  Airway Management Planned: Simple Face Mask  Additional Equipment:   Intra-op Plan:   Post-operative Plan:   Informed Consent: I have reviewed the patients History and Physical, chart, labs and discussed the procedure including the risks, benefits and alternatives for the proposed anesthesia with the patient or authorized representative who has indicated his/her understanding and acceptance.   Dental Advisory Given  Plan Discussed with: CRNA and Surgeon  Anesthesia Plan Comments:         Anesthesia Quick Evaluation

## 2013-01-08 NOTE — Op Note (Signed)
Procedure: Left Brachial Cephalic AV fistula  Preop: ESRD  Postop: ESRD  Anesthesia: Local with IV sedation  Assistant:[name] Roczniak  Findings: 3 mm cephalic vein  Procedure: After obtaining informed consent, the patient was taken to the operating room.  After adequate sedation, the left upper extremity was prepped and draped in usual sterile fashion.Local anesthesia was infiltrated near the antecubital crease.  A transverse incision was then made near the antecubital crease the left arm. The incision was carried into the subcutaneous tissues down to level of the cephalic vein. The cephalic vein was approximately 3 mm in diameter. It was of good quality. This was dissected free circumferentially and small side branches ligated and divided between silk ties or clips. Next the brachial artery was dissected free in the medial portion of the incision. The artery was  3-4 mm in diameter. The vessel loops were placed proximal and distal to the planned site of arteriotomy. The patient was given 5000 units of intravenous heparin. After appropriate circulation time, the vessel loops were used to control the artery. A longitudinal opening was made in the brachial artery.  The vein was ligated distally with a 2-0 silk tie. The vein was controlled proximally with a fine bulldog clamp. The vein was then swung over to the artery and sewn end of vein to side of artery using a running 7-0 Prolene suture. Just prior to completion of the anastomosis, everything was fore bled back bled and thoroughly flushed. The anastomosis was secured, vessel loops released, and there was a palpable thrill in the fistula immediately. After hemostasis was obtained, the subcutaneous tissues were reapproximated using a running 3-0 Vicryl suture. The skin was then closed with a 4 Vicryl subcuticular stitch. Dermabond was applied to the skin incision.  The patient had a palpable radial pulse at the end of the case.  Fabienne Bruns,  MD [facility] Office: [phone] Pager: [phone]

## 2013-01-08 NOTE — Interval H&P Note (Signed)
History and Physical Interval Note:  01/08/2013 7:26 AM  [name]  has presented today for surgery, with the diagnosis of End Stage Renal Disease  The various methods of treatment have been discussed with the patient and family. After consideration of risks, benefits and other options for treatment, the patient has consented to  Procedure(s) with comments: ARTERIOVENOUS (AV) FISTULA CREATION (Left) - Left Upper Extremity Arteriovenous Fistula Creation; possible Basilic Vein Transposition as a surgical intervention .  The patient's history has been reviewed, patient examined, no change in status, stable for surgery.  I have reviewed the patient's chart and labs.  Questions were answered to the patient's satisfaction.     [name]

## 2013-01-08 NOTE — Anesthesia Procedure Notes (Signed)
Procedure Name: MAC Date/Time: 01/08/2013 7:33 AM Performed by: Jerilee Hoh Pre-anesthesia Checklist: Patient identified, Emergency Drugs available, Suction available and Patient being monitored Patient Re-evaluated:Patient Re-evaluated prior to inductionOxygen Delivery Method: Circle system utilized Intubation Type: IV induction Placement Confirmation: positive ETCO2 and breath sounds checked- equal and bilateral

## 2013-01-08 NOTE — Transfer of Care (Signed)
Immediate Anesthesia Transfer of Care Note  Patient: [name]  Procedure(s) Performed: Procedure(s): ARTERIOVENOUS (AV) FISTULA CREATION (Left)  Patient Location: PACU  Anesthesia Type:MAC  Level of Consciousness: awake, alert , oriented and patient cooperative  Airway & Oxygen Therapy: Patient Spontanous Breathing and Patient connected to face mask oxygen  Post-op Assessment: Report given to PACU RN, Post -op Vital signs reviewed and stable and Patient moving all extremities  Post vital signs: Reviewed and stable  Complications: No apparent anesthesia complications

## 2013-01-09 ENCOUNTER — Encounter (HOSPITAL_COMMUNITY): Payer: Self-pay | Admitting: Vascular Surgery

## 2013-01-09 MED FILL — Mupirocin Oint 2%: CUTANEOUS | Qty: 22 | Status: AC

## 2013-01-22 ENCOUNTER — Telehealth: Payer: Self-pay | Admitting: Physician Assistant

## 2013-01-22 ENCOUNTER — Encounter: Payer: Medicare Other | Admitting: *Deleted

## 2013-01-22 NOTE — Telephone Encounter (Signed)
Pt called because he was concerned about ICD transmission.  He did not get back from dialysis until after 5 pm and so missed his scheduled transmission. He was not sure what to do. He is not having any problems, issues or other cardiac concerns.  Advised him I would send a message to the ICD clinic to call him and let him know how to proceed. He agreed with this plan.  Theodore Demark, PA-C 01/22/2013 6:13 PM Beeper [phone]

## 2013-01-29 ENCOUNTER — Encounter: Payer: Self-pay | Admitting: *Deleted

## 2013-02-08 ENCOUNTER — Encounter: Payer: Self-pay | Admitting: Internal Medicine

## 2013-02-08 ENCOUNTER — Ambulatory Visit: Payer: Medicare Other | Admitting: Vascular Surgery

## 2013-02-08 ENCOUNTER — Other Ambulatory Visit: Payer: Self-pay | Admitting: Internal Medicine

## 2013-02-08 ENCOUNTER — Ambulatory Visit (INDEPENDENT_AMBULATORY_CARE_PROVIDER_SITE_OTHER): Payer: Medicare Other | Admitting: *Deleted

## 2013-02-08 DIAGNOSIS — Z95810 Presence of automatic (implantable) cardiac defibrillator: Secondary | ICD-10-CM

## 2013-02-08 DIAGNOSIS — I5022 Chronic systolic (congestive) heart failure: Secondary | ICD-10-CM

## 2013-02-08 DIAGNOSIS — I2589 Other forms of chronic ischemic heart disease: Secondary | ICD-10-CM

## 2013-02-14 ENCOUNTER — Encounter: Payer: Self-pay | Admitting: Vascular Surgery

## 2013-02-14 LAB — REMOTE ICD DEVICE
AL AMPLITUDE: 2.4 mv
AL IMPEDENCE ICD: 494 Ohm
ATRIAL PACING ICD: 99.55 pct
CHARGE TIME: 10.109 s
LV LEAD IMPEDENCE ICD: 684 Ohm
RV LEAD AMPLITUDE: 7.6 mv
RV LEAD IMPEDENCE ICD: 437 Ohm
TOT-0001: 1
TOT-0002: 0
TOT-0006: 20120530000000
TZAT-0001ATACH: 2
TZAT-0001ATACH: 3
TZAT-0001FASTVT: 1
TZAT-0001SLOWVT: 1
TZAT-0001SLOWVT: 2
TZAT-0002ATACH: NEGATIVE
TZAT-0002FASTVT: NEGATIVE
TZAT-0004SLOWVT: 8
TZAT-0004SLOWVT: 8
TZAT-0012ATACH: 150 ms
TZAT-0012ATACH: 150 ms
TZAT-0012ATACH: 150 ms
TZAT-0012FASTVT: 200 ms
TZAT-0012SLOWVT: 200 ms
TZAT-0012SLOWVT: 200 ms
TZAT-0013SLOWVT: 2
TZAT-0013SLOWVT: 2
TZAT-0018ATACH: NEGATIVE
TZAT-0018FASTVT: NEGATIVE
TZAT-0019SLOWVT: 8 V
TZAT-0019SLOWVT: 8 V
TZAT-0020ATACH: 1.5 ms
TZAT-0020ATACH: 1.5 ms
TZAT-0020SLOWVT: 1.5 ms
TZAT-0020SLOWVT: 1.5 ms
TZON-0003ATACH: 350 ms
TZON-0003SLOWVT: 350 ms
TZON-0003VSLOWVT: 400 ms
TZON-0004SLOWVT: 32
TZON-0004VSLOWVT: 36
TZST-0001ATACH: 4
TZST-0001ATACH: 6
TZST-0001FASTVT: 4
TZST-0001FASTVT: 6
TZST-0001SLOWVT: 3
TZST-0001SLOWVT: 5
TZST-0002ATACH: NEGATIVE
TZST-0002ATACH: NEGATIVE
TZST-0002ATACH: NEGATIVE
TZST-0002FASTVT: NEGATIVE
TZST-0002FASTVT: NEGATIVE
TZST-0002FASTVT: NEGATIVE
TZST-0003SLOWVT: 25 J
TZST-0003SLOWVT: 35 J

## 2013-02-15 ENCOUNTER — Encounter: Payer: Self-pay | Admitting: Vascular Surgery

## 2013-02-15 ENCOUNTER — Ambulatory Visit (INDEPENDENT_AMBULATORY_CARE_PROVIDER_SITE_OTHER): Payer: Medicare Other | Admitting: Vascular Surgery

## 2013-02-15 ENCOUNTER — Encounter (INDEPENDENT_AMBULATORY_CARE_PROVIDER_SITE_OTHER): Payer: Medicare Other | Admitting: *Deleted

## 2013-02-15 VITALS — BP 99/57 | HR 60 | Temp 98.1°F | Resp 16 | Ht 71.0 in | Wt 152.0 lb

## 2013-02-15 DIAGNOSIS — Z4931 Encounter for adequacy testing for hemodialysis: Secondary | ICD-10-CM

## 2013-02-15 DIAGNOSIS — N186 End stage renal disease: Secondary | ICD-10-CM

## 2013-02-15 DIAGNOSIS — Z48812 Encounter for surgical aftercare following surgery on the circulatory system: Secondary | ICD-10-CM

## 2013-02-15 DIAGNOSIS — T82598A Other mechanical complication of other cardiac and vascular devices and implants, initial encounter: Secondary | ICD-10-CM

## 2013-02-15 NOTE — Progress Notes (Signed)
Patient is an 79 year old male who is status post creation of a left brachiocephalic AV fistula on 01/08/2013. He returns today for further followup. He denies any numbness or tingling in his hand. He is currently dialyzing via a right-sided catheter.  Physical exam:  Filed Vitals:   02/15/13 1237  BP: 99/57  Pulse: 60  Temp: 98.1 F (36.7 C)  TempSrc: Oral  Resp: 16  Height: 5\' 11"  (1.803 m)  Weight: 152 lb (68.947 kg)  SpO2: 100%    Left upper extremity:  + thrill easily palpable fistula  Data: I reviewed and interpreted his duplex today.  Fistula is 5-6 mm in diameter. There is one branch mid fistula.   Assessment: Maturing fistula  Plan: Fistula should be ready for cannulation in 2 weeks. Patient will followup as needed.  Fabienne Bruns, MD [facility] [place] Office: [phone] Pager: [phone]

## 2013-03-14 ENCOUNTER — Other Ambulatory Visit: Payer: Self-pay

## 2013-03-15 ENCOUNTER — Encounter: Payer: Self-pay | Admitting: *Deleted

## 2013-03-22 ENCOUNTER — Encounter (HOSPITAL_COMMUNITY)
Admission: RE | Admit: 2013-03-22 | Discharge: 2013-03-22 | Disposition: A | Discharge status: Home / Self Care | Payer: Medicare Other | Source: Ambulatory Visit | Attending: Vascular Surgery | Admitting: Vascular Surgery

## 2013-03-22 DIAGNOSIS — N186 End stage renal disease: Secondary | ICD-10-CM

## 2013-03-22 DIAGNOSIS — Z452 Encounter for adjustment and management of vascular access device: Secondary | ICD-10-CM | POA: Insufficient documentation

## 2013-03-22 LAB — PROTIME-INR: INR: 1.52 — ABNORMAL HIGH (ref 0.00–1.49)

## 2013-03-22 NOTE — Progress Notes (Signed)
Dressing to right chest CDI

## 2013-03-22 NOTE — Progress Notes (Signed)
[facility] SHORT STAY H&P  CC: ESRD   HPI: [name] is a 79 y.o. male who has been on HD through  functioning Hemodialysis access in the left upper extremity. They are here for HD catheter removal. Pt. denies signs of steal syndrome.  Past Medical History  Diagnosis Date  . Dyslipidemia   . CHF (congestive heart failure), NYHA class II   . Atrial fibrillation   . Prostate cancer   . Depression   . Shortness of breath   . Gout   . Myocardial infarction   . Hypothyroidism   . Chronic kidney disease     . Hemodialysis TTS  . Arthritis   . ICD (implantable cardiac defibrillator) in place   . Coronary artery disease     Sees Dr. Ladona Ridgel, [address] [facility] cardiology  . Hypertension     sees Dr. Petra Kuba    Family Hx Family History  Problem Relation Age of Onset  . Diabetes Mother     Social HX History  Substance Use Topics  . Smoking status: Former Smoker    Types: Cigarettes, Cigars    Quit date: 10/25/1968  . Smokeless tobacco: Never Used     Comment: stopped smoking in 1970  . Alcohol Use: No    Allergies No Known Allergies  Medications Current Outpatient Prescriptions  Medication Sig Dispense Refill  . acetaminophen-codeine (TYLENOL #2) 300-15 MG per tablet Take 1 tablet by mouth every 4 (four) hours as needed for pain.  10 tablet  0  . allopurinol (ZYLOPRIM) 100 MG tablet Take 100 mg by mouth daily.       Marland Kitchen amiodarone (PACERONE) 200 MG tablet Take 200 mg by mouth 2 (two) times daily.       Marland Kitchen atorvastatin (LIPITOR) 40 MG tablet Take 40 mg by mouth daily.        . carvedilol (COREG CR) 40 MG 24 hr capsule Take 40 mg by mouth daily.        . DULoxetine (CYMBALTA) 60 MG capsule Take 60 mg by mouth daily.        Marland Kitchen levothyroxine (SYNTHROID, LEVOTHROID) 25 MCG tablet Take 1 tablet (25 mcg total) by mouth daily before breakfast.  30 tablet  3  . Multiple Vitamin (MULTIVITAMIN WITH MINERALS) TABS Take 1 tablet by mouth daily.      Marland Kitchen warfarin  (COUMADIN) 3 MG tablet Take 3 mg by mouth daily.       No current facility-administered medications for this encounter.    Labs COAG Lab Results  Component Value Date   INR 1.52* 03/22/2013   INR 1.40 01/08/2013   INR 1.87* 10/18/2012   No results found for this basename: PTT    PHYSICAL EXAM  Filed Vitals:   03/22/13 1039  BP: 103/57  Pulse: 57  Temp: 98 F (36.7 C)  Resp: 18    General:  WDWN in NAD HENT: WNL Eyes: Pupils equal Pulmonary: normal non-labored breathing  Cardiac: RRR, Skin: normal, no cyanosis, jaundice, pallor or bruising Vascular Exam/Pulses: 2+ radial pulses in LEFT upper extremity. Extremities without ischemic changes, no Gangrene , no cellulitis; no open wounds;   There is a good thrill and good bruit in the AV fistula. Hand grip is 5/5 and sensation in digits is intact;   Impression: This is a 79 y.o. male who has a functioning HD access.  Plan: Removal of Right IJ HD catheter Thomasena Edis EMMA [facility] 03/22/2013 11:43 AM

## 2013-03-22 NOTE — Progress Notes (Signed)
[facility] Catheter Removal Procedure Note  Diagnosis: ESRD with Functioning AVF/AVGG  Plan:  Remove right diatek catheter  Consent signed:  yes Time out completed:  yes Coumadin:  yes PT/INR (if applicable):  INR 1.5 Other labs:   Procedure: 1.  Sterile prepping and draping over catheter area 2. 0 ml 2% lidocaine plain instilled at removal site. 3.  right catheter removed in its entirety with cuff in tact. 4.  Complications: none  5. Tip of catheter sent for culture:  no   Patient tolerated procedure well:  yes Pressure held, no bleeding noted, dressing applied Instructions given to the pt regarding wound care and bleeding.  OtherClinton Gallant [facility] 03/22/2013 11:45 AM

## 2013-03-22 NOTE — Progress Notes (Signed)
[facility] Catheter Removal Instructions   Please sit up at least 30 degrees for 4 hours then may lay flat   MAY REMOVE DRESSINGS IN AM AND MAY SHOWER   REPLACE DRESSING OVER EXIT SITE AS NEEDED   IF YOU SHOULD NOTE BLEEDING OR SWELLING AT THE NECK HOLD PRESSURE OVER THE DRESSINGS FOR 15 MINUTES, IF BLEEDING PERSISTS COME TO ER OR CALL 911 

## 2013-05-14 ENCOUNTER — Ambulatory Visit (INDEPENDENT_AMBULATORY_CARE_PROVIDER_SITE_OTHER): Payer: Medicare Other | Admitting: *Deleted

## 2013-05-14 DIAGNOSIS — Z95810 Presence of automatic (implantable) cardiac defibrillator: Secondary | ICD-10-CM

## 2013-05-14 DIAGNOSIS — I5022 Chronic systolic (congestive) heart failure: Secondary | ICD-10-CM

## 2013-05-14 DIAGNOSIS — I2589 Other forms of chronic ischemic heart disease: Secondary | ICD-10-CM

## 2013-05-16 LAB — REMOTE ICD DEVICE
ATRIAL PACING ICD: 97.98 pct
BAMS-0001: 170 {beats}/min
CHARGE TIME: 10.109 s
FVT: 0
LV LEAD THRESHOLD: 0.875 V
TZAT-0002ATACH: NEGATIVE
TZAT-0004SLOWVT: 8
TZAT-0011SLOWVT: 10 ms
TZAT-0011SLOWVT: 10 ms
TZAT-0012ATACH: 150 ms
TZAT-0012ATACH: 150 ms
TZAT-0012FASTVT: 200 ms
TZAT-0012SLOWVT: 200 ms
TZAT-0012SLOWVT: 200 ms
TZAT-0019ATACH: 6 V
TZAT-0019ATACH: 6 V
TZAT-0019SLOWVT: 8 V
TZAT-0019SLOWVT: 8 V
TZAT-0020ATACH: 1.5 ms
TZAT-0020ATACH: 1.5 ms
TZAT-0020FASTVT: 1.5 ms
TZON-0003ATACH: 350 ms
TZON-0003SLOWVT: 350 ms
TZST-0001ATACH: 4
TZST-0001FASTVT: 2
TZST-0001FASTVT: 3
TZST-0001SLOWVT: 4
TZST-0001SLOWVT: 6
TZST-0002ATACH: NEGATIVE
TZST-0002FASTVT: NEGATIVE
TZST-0002FASTVT: NEGATIVE
TZST-0002FASTVT: NEGATIVE
TZST-0003SLOWVT: 15 J
TZST-0003SLOWVT: 35 J
VENTRICULAR PACING ICD: 99.71 pct
VF: 0

## 2013-07-13 ENCOUNTER — Encounter: Payer: Self-pay | Admitting: *Deleted

## 2013-07-24 ENCOUNTER — Encounter: Payer: Self-pay | Admitting: Internal Medicine

## 2013-07-24 ENCOUNTER — Ambulatory Visit (INDEPENDENT_AMBULATORY_CARE_PROVIDER_SITE_OTHER): Payer: Medicare Other | Admitting: Internal Medicine

## 2013-07-24 VITALS — BP 118/57 | HR 60 | Ht 71.0 in | Wt 158.0 lb

## 2013-07-24 DIAGNOSIS — Z95810 Presence of automatic (implantable) cardiac defibrillator: Secondary | ICD-10-CM

## 2013-07-24 DIAGNOSIS — I4891 Unspecified atrial fibrillation: Secondary | ICD-10-CM

## 2013-07-24 DIAGNOSIS — I5022 Chronic systolic (congestive) heart failure: Secondary | ICD-10-CM

## 2013-07-24 DIAGNOSIS — I2589 Other forms of chronic ischemic heart disease: Secondary | ICD-10-CM

## 2013-07-24 LAB — ICD DEVICE OBSERVATION
AL AMPLITUDE: 2 mv
AL THRESHOLD: 0.75 V
ATRIAL PACING ICD: 97.18 pct
BAMS-0001: 170 {beats}/min
BATTERY VOLTAGE: 3.0051 V
CHARGE TIME: 10.41 s
FVT: 0
LV LEAD THRESHOLD: 0.75 V
PACEART VT: 0
RV LEAD IMPEDENCE ICD: 475 Ohm
RV LEAD THRESHOLD: 1 V
TOT-0006: 20120530000000
TZAT-0001ATACH: 1
TZAT-0001ATACH: 2
TZAT-0001ATACH: 3
TZAT-0001SLOWVT: 1
TZAT-0001SLOWVT: 2
TZAT-0002ATACH: NEGATIVE
TZAT-0002ATACH: NEGATIVE
TZAT-0002FASTVT: NEGATIVE
TZAT-0004SLOWVT: 8
TZAT-0005SLOWVT: 84 pct
TZAT-0005SLOWVT: 91 pct
TZAT-0011SLOWVT: 10 ms
TZAT-0011SLOWVT: 10 ms
TZAT-0012ATACH: 150 ms
TZAT-0018ATACH: NEGATIVE
TZAT-0018ATACH: NEGATIVE
TZAT-0018FASTVT: NEGATIVE
TZAT-0018SLOWVT: NEGATIVE
TZAT-0019ATACH: 6 V
TZAT-0019ATACH: 6 V
TZAT-0019ATACH: 6 V
TZAT-0019FASTVT: 8 V
TZAT-0019SLOWVT: 8 V
TZAT-0020ATACH: 1.5 ms
TZAT-0020FASTVT: 1.5 ms
TZAT-0020SLOWVT: 1.5 ms
TZAT-0020SLOWVT: 1.5 ms
TZON-0004SLOWVT: 32
TZON-0004VSLOWVT: 36
TZST-0001ATACH: 5
TZST-0001FASTVT: 3
TZST-0001FASTVT: 5
TZST-0001FASTVT: 6
TZST-0001SLOWVT: 3
TZST-0001SLOWVT: 4
TZST-0001SLOWVT: 5
TZST-0001SLOWVT: 6
TZST-0002ATACH: NEGATIVE
TZST-0002FASTVT: NEGATIVE
TZST-0002FASTVT: NEGATIVE
TZST-0002FASTVT: NEGATIVE
TZST-0003SLOWVT: 15 J
TZST-0003SLOWVT: 35 J
VENTRICULAR PACING ICD: 99.81 pct
VF: 0

## 2013-07-24 NOTE — Assessment & Plan Note (Signed)
He is maintaining sinus rhythm on amiodarone. Because he felt so poorly in atrial fibrillation, he will continue relatively high dose amiodarone.

## 2013-07-24 NOTE — Patient Instructions (Addendum)

## 2013-07-24 NOTE — Progress Notes (Signed)
HPI [name] returns today for followup. He is a very pleasant, 79 year old man with atrial fibrillation, chronic systolic heart failure, status post biventricular ICD implantation. In the interim, he has developed end-stage renal disease and is now on hemodialysis. Previously, he had atrial fibrillation which has been well-controlled on amiodarone therapy. Since initiating dialysis, his fluid status has been under much better control. He denies sodium indiscretion. He has had no ICD shock. His heart failure symptoms are class II.Marland Kitchen He denies bleeding. He denies chest pain. No syncope. No Known Allergies   Current Outpatient Prescriptions  Medication Sig Dispense Refill  . allopurinol (ZYLOPRIM) 100 MG tablet Take 100 mg by mouth daily.       Marland Kitchen amiodarone (PACERONE) 200 MG tablet Take 200 mg by mouth 2 (two) times daily.       Marland Kitchen atorvastatin (LIPITOR) 40 MG tablet Take 40 mg by mouth daily.        . carvedilol (COREG CR) 40 MG 24 hr capsule Take 40 mg by mouth daily.        . DULoxetine (CYMBALTA) 60 MG capsule Take 60 mg by mouth daily.        Marland Kitchen levothyroxine (SYNTHROID, LEVOTHROID) 25 MCG tablet Take 1 tablet (25 mcg total) by mouth daily before breakfast.  30 tablet  3  . midodrine (PROAMATINE) 5 MG tablet 5 mg. Three days a week. Monday, Wednesday and Friday.      . Multiple Vitamin (MULTIVITAMIN WITH MINERALS) TABS Take 1 tablet by mouth daily.      Marland Kitchen warfarin (COUMADIN) 3 MG tablet Take 3 mg by mouth daily.       No current facility-administered medications for this visit.     Past Medical History  Diagnosis Date  . Dyslipidemia   . CHF (congestive heart failure), NYHA class II   . Atrial fibrillation   . Prostate cancer   . Depression   . Shortness of breath   . Gout   . Myocardial infarction   . Hypothyroidism   . Chronic kidney disease     . Hemodialysis TTS  . Arthritis   . ICD (implantable cardiac defibrillator) in place   . Coronary artery disease     Sees Dr.  Ladona Ridgel, [facility] cardiology  . Hypertension     sees Dr. Petra Kuba    ROS:   All systems reviewed and negative except as noted in the HPI.   Past Surgical History  Procedure Laterality Date  . Medtronic biv icd  10/2006  . Pci/stent  02/2005  . Total knee arthroplasty    . Av fistula placement  10/13/2012    Procedure: ARTERIOVENOUS (AV) FISTULA CREATION;  Surgeon: Pryor Ochoa, MD;  Location: [facility] OR;  Service: Vascular;  Laterality: Right;  Creation right arm arteriovenous fistula   . Insertion of dialysis catheter  10/13/2012    Procedure: INSERTION OF DIALYSIS CATHETER;  Surgeon: Pryor Ochoa, MD;  Location: [facility] OR;  Service: Vascular;  Laterality: N/A;  right internal jugular vein  . Cardiac catheterization    . Ligation of competing branches of arteriovenous fistula  11/24/2012    Procedure: LIGATION OF COMPETING BRANCHES OF ARTERIOVENOUS FISTULA;  Surgeon: Larina Earthly, MD;  Location: [facility] OR;  Service: Vascular;  Laterality: Right;  ligation of competing branch right arteriovenous fistula  . Av fistula placement Left 01/08/2013    Procedure: ARTERIOVENOUS (AV) FISTULA CREATION;  Surgeon: Sherren Kerns, MD;  Location: [facility] OR;  Service: Vascular;  Laterality:  Left;     Family History  Problem Relation Age of Onset  . Diabetes Mother      History   Social History  . Marital Status: Married    Spouse Name: N/A    Number of Children: N/A  . Years of Education: N/A   Occupational History  . retired    Social History Main Topics  . Smoking status: Former Smoker    Types: Cigarettes, Cigars    Quit date: 10/25/1968  . Smokeless tobacco: Never Used     Comment: stopped smoking in 1970  . Alcohol Use: No  . Drug Use: No  . Sexual Activity: Not on file   Other Topics Concern  . Not on file   Social History Narrative  . No narrative on file     BP 118/57  Pulse 60  Ht 5\' 11"  (1.803 m)  Wt 158 lb (71.668 kg)  BMI 22.05 kg/m2  Physical Exam:  well  appearing 79 year old man, NAD HEENT: Unremarkable Neck:  6 cm JVD, no thyromegally Lungs:  Clear with no wheezes, rales, or rhonchi. HEART:  Regular rate rhythm, no murmurs, no rubs, no clicks Abd:  soft, positive bowel sounds, no organomegally, no rebound, no guarding Ext:  2 plus pulses, no edema, no cyanosis, no clubbing Skin:  No rashes no nodules Neuro:  CN II through XII intact, motor grossly intact   DEVICE  Normal device function.  See PaceArt for details. Underlying rhythm is normal sinus rhythm  Assess/Plan:

## 2013-07-24 NOTE — Assessment & Plan Note (Signed)
His Medtronic biventricular ICD is working normally. I am concerned that his dialysis site is in his left arm, very close to his ICD site. He will undergo watchful waiting.

## 2013-07-24 NOTE — Assessment & Plan Note (Signed)
His chronic systolic heart failure is currently well compensated. He will continue his 3 times a week dialysis, and maintain a low-sodium diet.

## 2013-07-30 ENCOUNTER — Encounter: Payer: Self-pay | Admitting: Internal Medicine

## 2013-08-01 ENCOUNTER — Encounter: Payer: Self-pay | Admitting: Internal Medicine

## 2013-10-26 ENCOUNTER — Ambulatory Visit (INDEPENDENT_AMBULATORY_CARE_PROVIDER_SITE_OTHER): Payer: Medicare Other | Admitting: *Deleted

## 2013-10-26 DIAGNOSIS — I4891 Unspecified atrial fibrillation: Secondary | ICD-10-CM

## 2013-10-26 DIAGNOSIS — I5022 Chronic systolic (congestive) heart failure: Secondary | ICD-10-CM

## 2013-10-26 DIAGNOSIS — I2589 Other forms of chronic ischemic heart disease: Secondary | ICD-10-CM

## 2013-10-31 LAB — MDC_IDC_ENUM_SESS_TYPE_REMOTE
Brady Statistic AP VS Percent: 0.06 %
Brady Statistic AS VP Percent: 10.33 %
Brady Statistic RA Percent Paced: 89.63 %
Brady Statistic RV Percent Paced: 99.9 %
Date Time Interrogation Session: 20150102170127
HIGH POWER IMPEDANCE MEASURED VALUE: 33 Ohm
HighPow Impedance: 40 Ohm
Lead Channel Impedance Value: 228 Ohm
Lead Channel Impedance Value: 437 Ohm
Lead Channel Impedance Value: 475 Ohm
Lead Channel Impedance Value: 475 Ohm
Lead Channel Impedance Value: 627 Ohm
Lead Channel Pacing Threshold Amplitude: 0.75 V
Lead Channel Pacing Threshold Amplitude: 0.75 V
Lead Channel Pacing Threshold Amplitude: 0.875 V
Lead Channel Pacing Threshold Pulse Width: 0.4 ms
Lead Channel Sensing Intrinsic Amplitude: 1.125 mV
Lead Channel Setting Pacing Amplitude: 1.75 V
Lead Channel Setting Pacing Amplitude: 2 V
Lead Channel Setting Pacing Pulse Width: 0.4 ms
MDC IDC MSMT BATTERY VOLTAGE: 3.02 V
MDC IDC MSMT LEADCHNL LV PACING THRESHOLD PULSEWIDTH: 0.4 ms
MDC IDC MSMT LEADCHNL RA PACING THRESHOLD PULSEWIDTH: 0.4 ms
MDC IDC MSMT LEADCHNL RV SENSING INTR AMPL: 9.375 mV
MDC IDC SET LEADCHNL LV PACING PULSEWIDTH: 0.4 ms
MDC IDC SET LEADCHNL RV PACING AMPLITUDE: 2.5 V
MDC IDC SET LEADCHNL RV SENSING SENSITIVITY: 0.3 mV
MDC IDC SET ZONE DETECTION INTERVAL: 350 ms
MDC IDC SET ZONE DETECTION INTERVAL: 400 ms
MDC IDC STAT BRADY AP VP PERCENT: 89.58 %
MDC IDC STAT BRADY AS VS PERCENT: 0.04 %
Zone Setting Detection Interval: 300 ms
Zone Setting Detection Interval: 350 ms

## 2013-11-07 ENCOUNTER — Encounter: Payer: Self-pay | Admitting: *Deleted

## 2013-11-13 ENCOUNTER — Encounter: Payer: Self-pay | Admitting: Internal Medicine

## 2014-01-28 ENCOUNTER — Ambulatory Visit (INDEPENDENT_AMBULATORY_CARE_PROVIDER_SITE_OTHER): Payer: Medicare Other | Admitting: *Deleted

## 2014-01-28 DIAGNOSIS — I2589 Other forms of chronic ischemic heart disease: Secondary | ICD-10-CM

## 2014-01-28 DIAGNOSIS — I5022 Chronic systolic (congestive) heart failure: Secondary | ICD-10-CM

## 2014-01-28 DIAGNOSIS — Z95810 Presence of automatic (implantable) cardiac defibrillator: Secondary | ICD-10-CM

## 2014-01-30 LAB — MDC_IDC_ENUM_SESS_TYPE_REMOTE
Battery Voltage: 2.97 V
Brady Statistic AS VP Percent: 13.78 %
Brady Statistic AS VS Percent: 0.08 %
HIGH POWER IMPEDANCE MEASURED VALUE: 36 Ohm
HighPow Impedance: 44 Ohm
Lead Channel Impedance Value: 475 Ohm
Lead Channel Pacing Threshold Amplitude: 0.625 V
Lead Channel Pacing Threshold Amplitude: 0.75 V
Lead Channel Pacing Threshold Pulse Width: 0.4 ms
Lead Channel Sensing Intrinsic Amplitude: 8.5 mV
Lead Channel Sensing Intrinsic Amplitude: 8.5 mV
Lead Channel Setting Pacing Amplitude: 2.5 V
Lead Channel Setting Pacing Pulse Width: 0.4 ms
Lead Channel Setting Sensing Sensitivity: 0.3 mV
MDC IDC MSMT LEADCHNL LV IMPEDANCE VALUE: 285 Ohm
MDC IDC MSMT LEADCHNL LV IMPEDANCE VALUE: 646 Ohm
MDC IDC MSMT LEADCHNL LV PACING THRESHOLD PULSEWIDTH: 0.4 ms
MDC IDC MSMT LEADCHNL RA IMPEDANCE VALUE: 475 Ohm
MDC IDC MSMT LEADCHNL RA SENSING INTR AMPL: 1.75 mV
MDC IDC MSMT LEADCHNL RA SENSING INTR AMPL: 1.75 mV
MDC IDC MSMT LEADCHNL RV IMPEDANCE VALUE: 437 Ohm
MDC IDC MSMT LEADCHNL RV PACING THRESHOLD AMPLITUDE: 0.625 V
MDC IDC MSMT LEADCHNL RV PACING THRESHOLD PULSEWIDTH: 0.4 ms
MDC IDC SESS DTM: 20150406141147
MDC IDC SET LEADCHNL LV PACING AMPLITUDE: 1.75 V
MDC IDC SET LEADCHNL LV PACING PULSEWIDTH: 0.4 ms
MDC IDC SET LEADCHNL RA PACING AMPLITUDE: 2 V
MDC IDC SET ZONE DETECTION INTERVAL: 300 ms
MDC IDC SET ZONE DETECTION INTERVAL: 350 ms
MDC IDC SET ZONE DETECTION INTERVAL: 350 ms
MDC IDC STAT BRADY AP VP PERCENT: 86.09 %
MDC IDC STAT BRADY AP VS PERCENT: 0.05 %
MDC IDC STAT BRADY RA PERCENT PACED: 86.14 %
MDC IDC STAT BRADY RV PERCENT PACED: 99.88 %
Zone Setting Detection Interval: 400 ms

## 2014-02-07 ENCOUNTER — Encounter: Payer: Self-pay | Admitting: *Deleted

## 2014-02-19 ENCOUNTER — Encounter: Payer: Self-pay | Admitting: Internal Medicine

## 2014-05-01 ENCOUNTER — Ambulatory Visit (INDEPENDENT_AMBULATORY_CARE_PROVIDER_SITE_OTHER): Payer: Medicare Other | Admitting: *Deleted

## 2014-05-01 ENCOUNTER — Encounter: Payer: Self-pay | Admitting: Internal Medicine

## 2014-05-01 ENCOUNTER — Encounter: Payer: Self-pay | Admitting: Cardiology

## 2014-05-01 ENCOUNTER — Telehealth: Payer: Self-pay | Admitting: Cardiology

## 2014-05-01 DIAGNOSIS — I5022 Chronic systolic (congestive) heart failure: Secondary | ICD-10-CM

## 2014-05-01 DIAGNOSIS — I2589 Other forms of chronic ischemic heart disease: Secondary | ICD-10-CM

## 2014-05-01 LAB — MDC_IDC_ENUM_SESS_TYPE_REMOTE
Battery Voltage: 2.95 V
Brady Statistic AP VS Percent: 0.23 %
Brady Statistic AS VP Percent: 32.64 %
Brady Statistic RA Percent Paced: 59.4 %
Brady Statistic RV Percent Paced: 91.8 %
HighPow Impedance: 39 Ohm
HighPow Impedance: 49 Ohm
Lead Channel Impedance Value: 475 Ohm
Lead Channel Impedance Value: 627 Ohm
Lead Channel Pacing Threshold Amplitude: 0.625 V
Lead Channel Pacing Threshold Pulse Width: 0.4 ms
Lead Channel Setting Pacing Amplitude: 1.75 V
Lead Channel Setting Pacing Amplitude: 2.5 V
Lead Channel Setting Pacing Pulse Width: 0.4 ms
Lead Channel Setting Pacing Pulse Width: 0.4 ms
MDC IDC MSMT LEADCHNL LV IMPEDANCE VALUE: 285 Ohm
MDC IDC MSMT LEADCHNL LV PACING THRESHOLD AMPLITUDE: 0.625 V
MDC IDC MSMT LEADCHNL LV PACING THRESHOLD PULSEWIDTH: 0.4 ms
MDC IDC MSMT LEADCHNL RA IMPEDANCE VALUE: 494 Ohm
MDC IDC MSMT LEADCHNL RA SENSING INTR AMPL: 3.625 mV
MDC IDC MSMT LEADCHNL RA SENSING INTR AMPL: 3.625 mV
MDC IDC MSMT LEADCHNL RV IMPEDANCE VALUE: 437 Ohm
MDC IDC MSMT LEADCHNL RV SENSING INTR AMPL: 6.75 mV
MDC IDC MSMT LEADCHNL RV SENSING INTR AMPL: 6.75 mV
MDC IDC SESS DTM: 20150708144120
MDC IDC SET LEADCHNL RA PACING AMPLITUDE: 2 V
MDC IDC SET LEADCHNL RV SENSING SENSITIVITY: 0.3 mV
MDC IDC SET ZONE DETECTION INTERVAL: 300 ms
MDC IDC SET ZONE DETECTION INTERVAL: 350 ms
MDC IDC STAT BRADY AP VP PERCENT: 59.16 %
MDC IDC STAT BRADY AS VS PERCENT: 7.96 %
Zone Setting Detection Interval: 350 ms
Zone Setting Detection Interval: 400 ms

## 2014-05-01 NOTE — Telephone Encounter (Signed)
Spoke with pt wife and requested that she have pt send manual transmission so monitor can receive updates. She verbalized understanding.

## 2014-05-01 NOTE — Progress Notes (Signed)
Remote ICD transmission.   

## 2014-05-13 ENCOUNTER — Encounter: Payer: Self-pay | Admitting: Cardiology

## 2014-06-08 ENCOUNTER — Ambulatory Visit (INDEPENDENT_AMBULATORY_CARE_PROVIDER_SITE_OTHER): Payer: Medicare Other | Admitting: Emergency Medicine

## 2014-06-08 VITALS — BP 112/52 | HR 77 | Temp 98.5°F | Resp 14 | Ht 68.0 in | Wt 164.0 lb

## 2014-06-08 DIAGNOSIS — R58 Hemorrhage, not elsewhere classified: Secondary | ICD-10-CM

## 2014-06-08 DIAGNOSIS — T82898A Other specified complication of vascular prosthetic devices, implants and grafts, initial encounter: Secondary | ICD-10-CM

## 2014-06-08 DIAGNOSIS — I2589 Other forms of chronic ischemic heart disease: Secondary | ICD-10-CM

## 2014-06-08 DIAGNOSIS — T82838A Hemorrhage of vascular prosthetic devices, implants and grafts, initial encounter: Secondary | ICD-10-CM

## 2014-06-08 NOTE — Progress Notes (Signed)
Subjective:   This chart was scribed for Remo Lipps A. Everlene Farrier, MD by Forrestine Him, [facility] Scribe. This patient was seen in room 8 and the patient's care was started 12:55 PM.   Patient ID: [name], male    DOB: May 20, 1930, 80 y.o.   MRN: [id number]  Chief Complaint  Patient presents with  . Arm Pain    arm bleeding following dialysis treatment yesterday, gushing out since yesterday 8 pm    HPI  HPI Comments: [name] is a 80 y.o. male with a PMHx of chronic kidney disease, CAD, HTN, A-Fib, CHF who presents to [facility] complaining of constant, moderate R arm pain with associated swelling to the R arm/R hand onset yesterday. Pt states he noted symptoms around 3 PM yesterday after his dialysis treatment. States bleeding from injection site was continuous for a number of hours after dialysis. Bleeding controlled at this time in office. He denies any fever or chills. No loss of sensation, numbness, or weakness. Pt goes to dialysis treatment Mondays, Wednesdays, and Fridays at the [place] location. No known allergies to medications. No other concerns this visit.  Patient Active Problem List   Diagnosis Date Noted  . End stage renal disease 11/21/2012  . Aftercare following surgery of the circulatory system, [place] 11/21/2012  . CARDIOMYOPATHY, ISCHEMIC 11/13/2009  . ATRIAL FIBRILLATION 11/13/2009  . CHRONIC SYSTOLIC HEART FAILURE 62/83/1517  . ICD-Medtronic 11/13/2009   Past Medical History  Diagnosis Date  . Dyslipidemia   . CHF (congestive heart failure), NYHA class II   . Atrial fibrillation   . Prostate cancer   . Depression   . Shortness of breath   . Gout   . Myocardial infarction   . Hypothyroidism   . Chronic kidney disease     . Hemodialysis TTS  . Arthritis   . ICD (implantable cardiac defibrillator) in place   . Coronary artery disease     Sees Dr. Lovena Le, [facility] cardiology  . Hypertension     sees Dr.  Katherine Roan   Past Surgical History  Procedure Laterality Date  . Medtronic biv icd  10/2006  . Pci/stent  02/2005  . Total knee arthroplasty    . Av fistula placement  10/13/2012    Procedure: ARTERIOVENOUS (AV) FISTULA CREATION;  Surgeon: Mal Misty, MD;  Location: [place];  Service: Vascular;  Laterality: Right;  Creation right arm arteriovenous fistula   . Insertion of dialysis catheter  10/13/2012    Procedure: INSERTION OF DIALYSIS CATHETER;  Surgeon: Mal Misty, MD;  Location: [place];  Service: Vascular;  Laterality: N/A;  right internal jugular vein  . Cardiac catheterization    . Ligation of competing branches of arteriovenous fistula  11/24/2012    Procedure: LIGATION OF COMPETING BRANCHES OF ARTERIOVENOUS FISTULA;  Surgeon: Rosetta Posner, MD;  Location: [facility] OR;  Service: Vascular;  Laterality: Right;  ligation of competing branch right arteriovenous fistula  . Av fistula placement Left 01/08/2013    Procedure: ARTERIOVENOUS (AV) FISTULA CREATION;  Surgeon: Elam Dutch, MD;  Location: [place];  Service: Vascular;  Laterality: Left;   No Known Allergies Prior to Admission medications   Medication Sig Start Date End Date Taking? Authorizing Provider  allopurinol (ZYLOPRIM) 100 MG tablet Take 100 mg by mouth daily.  06/12/12  Yes Historical Provider, MD  amiodarone (PACERONE) 200 MG tablet Take 200 mg by mouth 2 (two) times daily.  06/16/12  Yes Evans Lance, MD  atorvastatin (LIPITOR) 40 MG tablet Take 40 mg by mouth daily.     Yes Historical Provider, MD  carvedilol (COREG CR) 40 MG 24 hr capsule Take 40 mg by mouth daily.     Yes Historical Provider, MD  levothyroxine (SYNTHROID, LEVOTHROID) 25 MCG tablet Take 1 tablet (25 mcg total) by mouth daily before breakfast. 10/18/12  Yes Clent Demark, MD  midodrine (PROAMATINE) 5 MG tablet 5 mg. Three days a week. Monday, Wednesday and Friday. 07/20/13  Yes Historical Provider, MD  Multiple Vitamin (MULTIVITAMIN WITH MINERALS) TABS  Take 1 tablet by mouth daily.   Yes Historical Provider, MD  POTASSIUM CHLORIDE PO Take 20 mEq by mouth daily.   Yes Historical Provider, MD  warfarin (COUMADIN) 3 MG tablet Take 3 mg by mouth daily.   Yes Historical Provider, MD  DULoxetine (CYMBALTA) 60 MG capsule Take 60 mg by mouth daily.      Historical Provider, MD    Review of Systems  Constitutional: Negative for fever and chills.  HENT: Positive for congestion.   Musculoskeletal: Positive for arthralgias (R arm).  Skin: Positive for wound (R arm).  Neurological: Negative for weakness and numbness.    Triage Vitals: BP 112/52  Pulse 77  Temp(Src) 98.5 F (36.9 C) (Oral)  Resp 14  Ht 5\' 8"  (1.727 m)  Wt 164 lb (74.39 kg)  BMI 24.94 kg/m2  SpO2 98%   Objective:  Physical Exam  Nursing note and vitals reviewed. Constitutional: He is oriented to person, place, and time. He appears well-developed and well-nourished.  HENT:  Head: Normocephalic.  Nose: Nose normal.  Eyes: EOM are normal.  Neck: Normal range of motion.  Cardiovascular:  Capillary refill 3-4 seconds  Pulmonary/Chest: Effort normal.  Abdominal: He exhibits no distension.  Musculoskeletal: Normal range of motion.  AV fistula to L upper arm 2 puncture sites noted No active bleeding at present time Swelling of L hand and fingers Ring removed from L 4th finger   Neurological: He is alert and oriented to person, place, and time.  Sensation intact and normal  Psychiatric: He has a normal mood and affect.     Assessment & Plan:  There was no active bleeding at the present time. Blood was removed from the puncture sites using hydrogen peroxide. 2 sterile Telfa dressings were applied over the puncture sites followed by application of Curlex patient tolerated well. No active bleeding at the time he left the office. I personally performed the services described in this documentation, which was scribed in my presence. The recorded information has been reviewed  and is accurate.

## 2014-07-16 ENCOUNTER — Encounter: Payer: Self-pay | Admitting: Internal Medicine

## 2014-07-16 ENCOUNTER — Ambulatory Visit (INDEPENDENT_AMBULATORY_CARE_PROVIDER_SITE_OTHER): Payer: Medicare Other | Admitting: Internal Medicine

## 2014-07-16 VITALS — BP 110/56 | HR 81 | Ht 71.0 in | Wt 169.8 lb

## 2014-07-16 DIAGNOSIS — Z95810 Presence of automatic (implantable) cardiac defibrillator: Secondary | ICD-10-CM

## 2014-07-16 DIAGNOSIS — I480 Paroxysmal atrial fibrillation: Secondary | ICD-10-CM

## 2014-07-16 DIAGNOSIS — I4891 Unspecified atrial fibrillation: Secondary | ICD-10-CM

## 2014-07-16 DIAGNOSIS — I5022 Chronic systolic (congestive) heart failure: Secondary | ICD-10-CM

## 2014-07-16 DIAGNOSIS — I2589 Other forms of chronic ischemic heart disease: Secondary | ICD-10-CM

## 2014-07-16 LAB — MDC_IDC_ENUM_SESS_TYPE_INCLINIC
Battery Voltage: 2.86 V
Brady Statistic AP VP Percent: 61.21 %
Brady Statistic AP VS Percent: 0.09 %
HIGH POWER IMPEDANCE MEASURED VALUE: 43 Ohm
HighPow Impedance: 36 Ohm
Lead Channel Impedance Value: 266 Ohm
Lead Channel Impedance Value: 475 Ohm
Lead Channel Impedance Value: 475 Ohm
Lead Channel Impedance Value: 627 Ohm
Lead Channel Pacing Threshold Amplitude: 0.875 V
Lead Channel Pacing Threshold Pulse Width: 0.4 ms
Lead Channel Sensing Intrinsic Amplitude: 2 mV
Lead Channel Sensing Intrinsic Amplitude: 7.625 mV
Lead Channel Sensing Intrinsic Amplitude: 7.75 mV
Lead Channel Setting Pacing Amplitude: 1.75 V
Lead Channel Setting Pacing Amplitude: 2.5 V
Lead Channel Setting Pacing Pulse Width: 0.4 ms
Lead Channel Setting Pacing Pulse Width: 0.4 ms
MDC IDC MSMT LEADCHNL LV PACING THRESHOLD AMPLITUDE: 0.75 V
MDC IDC MSMT LEADCHNL RA PACING THRESHOLD PULSEWIDTH: 0.4 ms
MDC IDC MSMT LEADCHNL RA SENSING INTR AMPL: 2.375 mV
MDC IDC MSMT LEADCHNL RV IMPEDANCE VALUE: 437 Ohm
MDC IDC MSMT LEADCHNL RV PACING THRESHOLD AMPLITUDE: 0.75 V
MDC IDC MSMT LEADCHNL RV PACING THRESHOLD PULSEWIDTH: 0.4 ms
MDC IDC SESS DTM: 20150922143139
MDC IDC SET LEADCHNL RA PACING AMPLITUDE: 2 V
MDC IDC SET LEADCHNL RV SENSING SENSITIVITY: 0.3 mV
MDC IDC SET ZONE DETECTION INTERVAL: 300 ms
MDC IDC SET ZONE DETECTION INTERVAL: 350 ms
MDC IDC STAT BRADY AS VP PERCENT: 30.13 %
MDC IDC STAT BRADY AS VS PERCENT: 8.56 %
MDC IDC STAT BRADY RA PERCENT PACED: 61.31 %
MDC IDC STAT BRADY RV PERCENT PACED: 91.35 %
Zone Setting Detection Interval: 350 ms
Zone Setting Detection Interval: 400 ms

## 2014-07-16 NOTE — Assessment & Plan Note (Signed)
His Medtronic I. ventricular ICD is working normally. We'll plan to recheck in several months.

## 2014-07-16 NOTE — Assessment & Plan Note (Signed)
He has been chronically in atrial fibrillation for 4 months. He is unaware of his atrial fibrillation. We discussed the treatment options. We discussed proceeding with cardioversion versus stopping amiodarone and rate control. He would like to pursue the latter, and we will stop amiodarone.

## 2014-07-16 NOTE — Assessment & Plan Note (Signed)
His chronic systolic heart failure is class IIA. He'll continue his current medications.

## 2014-07-16 NOTE — Progress Notes (Signed)
HPI [name] for followup. He is a very pleasant, 80 year old man with atrial fibrillation, chronic systolic heart failure, status post biventricular ICD implantation. In the interim, he has developed end-stage renal disease and is now on hemodialysis. Previously, he had atrial fibrillation which has been well-controlled on amiodarone therapy. Since initiating dialysis, his fluid status has been under much better control. He denies sodium indiscretion. He has had no ICD shock. His heart failure symptoms are class II.Marland Kitchen He denies bleeding. He denies chest pain. No syncope. He does not feel palpitations. No Known Allergies   Current Outpatient Prescriptions  Medication Sig Dispense Refill  . allopurinol (ZYLOPRIM) 100 MG tablet Take 100 mg by mouth daily.       Marland Kitchen amiodarone (PACERONE) 200 MG tablet Take 200 mg by mouth 2 (two) times daily.       Marland Kitchen atorvastatin (LIPITOR) 40 MG tablet Take 40 mg by mouth daily.        . Calcium Acetate, Phos Binder, (CALCIUM ACETATE PO) Take 2 capsules by mouth daily.      . carvedilol (COREG) 3.125 MG tablet Take 1 tablet by mouth daily.      Marland Kitchen levothyroxine (SYNTHROID, LEVOTHROID) 25 MCG tablet Take 1 tablet (25 mcg total) by mouth daily before breakfast.  30 tablet  3  . midodrine (PROAMATINE) 5 MG tablet Take 1 tablet three days a week on Monday, Wednesday and Friday.      . Multiple Vitamin (MULTIVITAMIN WITH MINERALS) TABS Take 1 tablet by mouth daily.      Marland Kitchen warfarin (COUMADIN) 3 MG tablet Take 1 tablet daily except take 1 1/2 tablets on Sundays       No current facility-administered medications for this visit.     Past Medical History  Diagnosis Date  . Dyslipidemia   . CHF (congestive heart failure), NYHA class II   . Atrial fibrillation   . Prostate cancer   . Depression   . Shortness of breath   . Gout   . Myocardial infarction   . Hypothyroidism   . Chronic kidney disease     . Hemodialysis TTS  . Arthritis   . ICD  (implantable cardiac defibrillator) in place   . Coronary artery disease     Sees Dr. Lovena Le, [facility] cardiology  . Hypertension     sees Dr. Katherine Roan    ROS:   All systems reviewed and negative except as noted in the HPI.   Past Surgical History  Procedure Laterality Date  . Medtronic biv icd  10/2006  . Pci/stent  02/2005  . Total knee arthroplasty    . Av fistula placement  10/13/2012    Procedure: ARTERIOVENOUS (AV) FISTULA CREATION;  Surgeon: Mal Misty, MD;  Location: [place];  Service: Vascular;  Laterality: Right;  Creation right arm arteriovenous fistula   . Insertion of dialysis catheter  10/13/2012    Procedure: INSERTION OF DIALYSIS CATHETER;  Surgeon: Mal Misty, MD;  Location: [place];  Service: Vascular;  Laterality: N/A;  right internal jugular vein  . Cardiac catheterization    . Ligation of competing branches of arteriovenous fistula  11/24/2012    Procedure: LIGATION OF COMPETING BRANCHES OF ARTERIOVENOUS FISTULA;  Surgeon: Rosetta Posner, MD;  Location: [facility] OR;  Service: Vascular;  Laterality: Right;  ligation of competing branch right arteriovenous fistula  . Av fistula placement Left 01/08/2013    Procedure: ARTERIOVENOUS (AV) FISTULA CREATION;  Surgeon: Elam Dutch, MD;  Location:  [facility] OR;  Service: Vascular;  Laterality: Left;     Family History  Problem Relation Age of Onset  . Diabetes Mother      History   Social History  . Marital Status: Married    Spouse Name: N/A    Number of Children: N/A  . Years of Education: N/A   Occupational History  . retired    Social History Main Topics  . Smoking status: Former Smoker    Types: Cigarettes, Cigars    Quit date: 10/25/1968  . Smokeless tobacco: Never Used     Comment: stopped smoking in 1970  . Alcohol Use: No  . Drug Use: No  . Sexual Activity: Not on file   Other Topics Concern  . Not on file   Social History Narrative  . No narrative on file     BP 110/56  Pulse 81  Ht 5'  11" (1.803 m)  Wt 169 lb 12.8 oz (77.021 kg)  BMI 23.69 kg/m2  Physical Exam:  well appearing 80 year old man, NAD HEENT: Unremarkable Neck:  6 cm JVD, no thyromegally Lungs:  Clear with no wheezes, rales, or rhonchi. HEART:  Regular rate rhythm, no murmurs, no rubs, no clicks Abd:  soft, positive bowel sounds, no organomegally, no rebound, no guarding Ext:  2 plus pulses, no edema, no cyanosis, no clubbing Skin:  No rashes no nodules Neuro:  CN II through XII intact, motor grossly intact   DEVICE  Normal device function.  See PaceArt for details. Underlying rhythm is atrial fibrillation  Assess/Plan:

## 2014-07-16 NOTE — Patient Instructions (Signed)
Your physician wants you to follow-up in: 12 months with Dr Knox Saliva will receive a reminder letter in the mail two months in advance. If you don't receive a letter, please call our office to schedule the follow-up appointment.  Remote monitoring is used to monitor your Pacemaker of ICD from home. This monitoring reduces the number of office visits required to check your device to one time per year. It allows [place] to keep an eye on the functioning of your device to ensure it is working properly. You are scheduled for a device check from home on 10/17/14. You may send your transmission at any time that day. If you have a wireless device, the transmission will be sent automatically. After your physician reviews your transmission, you will receive a postcard with your next transmission date.   Your physician has recommended you make the following change in your medication:  1) Stop Amiodarone

## 2014-10-03 ENCOUNTER — Encounter (HOSPITAL_COMMUNITY): Payer: Self-pay | Admitting: Internal Medicine

## 2014-11-08 IMAGING — CR DG CHEST 2V
2 series · 2 of 2 positions shown · non-contrast
Comparison: 10/13/2012

CLINICAL DATA: Preop evaluation, CHF

CHEST - 2 VIEW

[w chest lat]
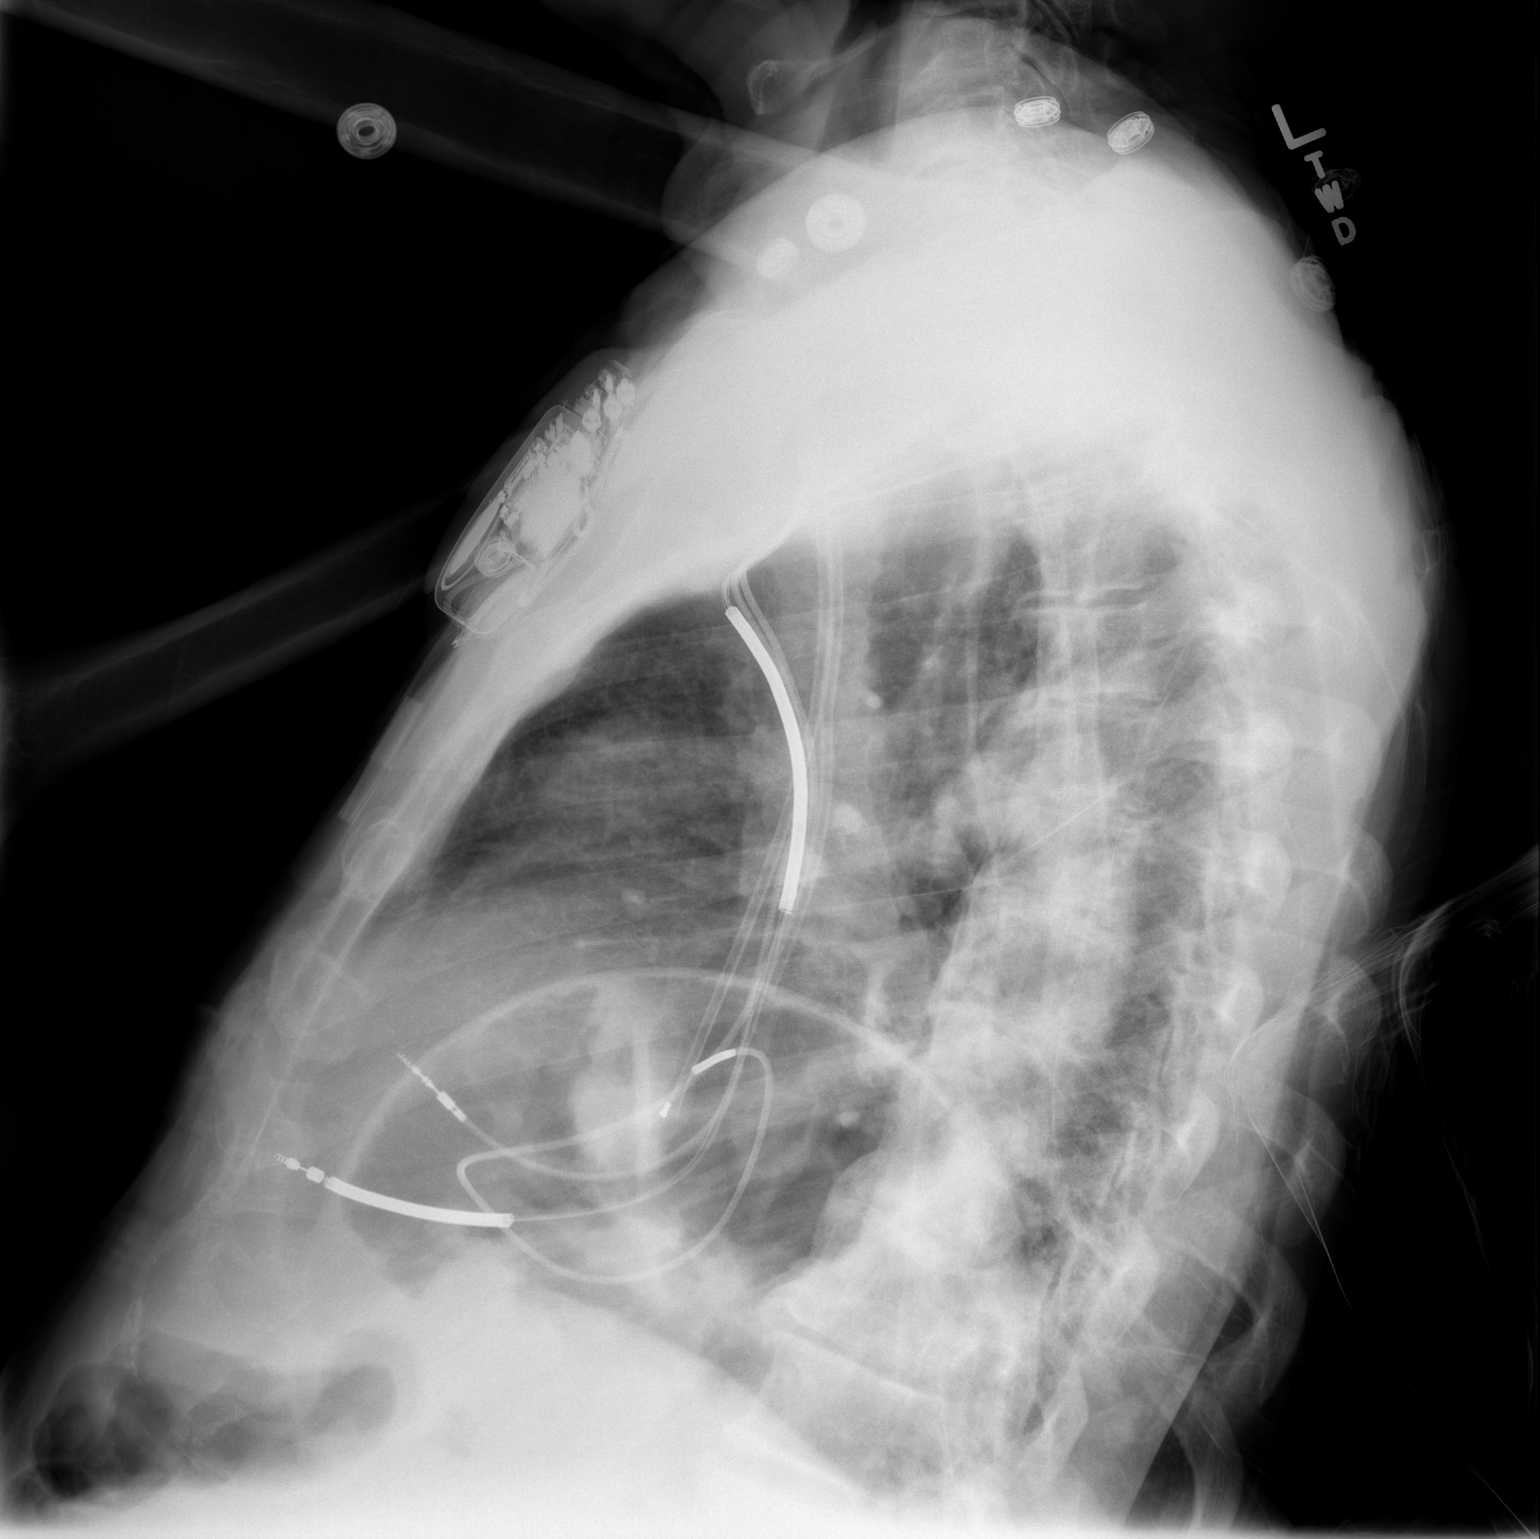

[w chest pa]
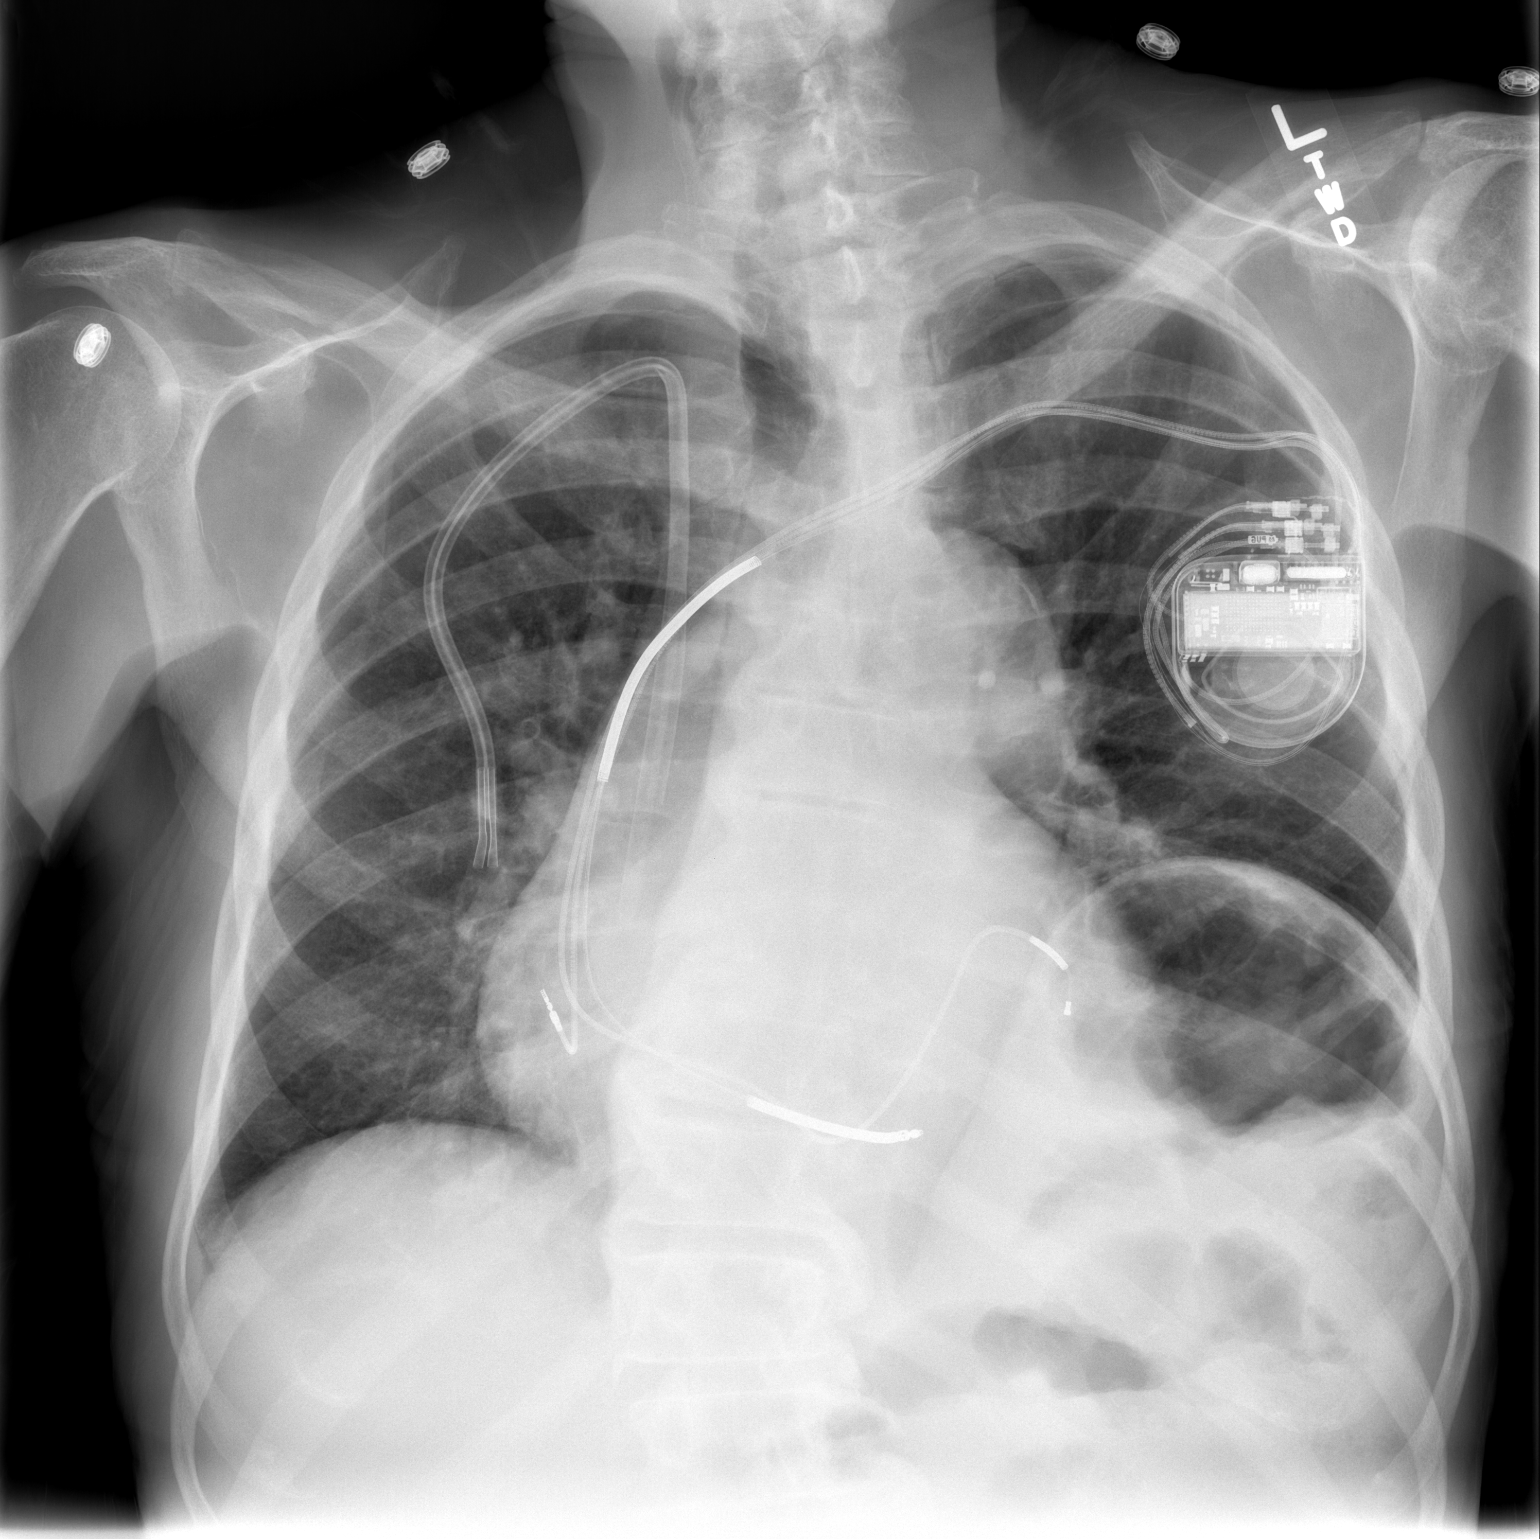

[2 of 2 positions shown; findings below may reference images not displayed]

FINDINGS: Left subclavian AICD / pacer noted.  Right IJ dialysis
catheter tips in the SVC.  Heart remains enlarged with resolving
edema.  Chronic left hemidiaphragm elevation.  No effusion or
pneumothorax.  No focal pneumonia.
IMPRESSION: Resolving CHF

## 2015-03-26 DIAGNOSIS — Z9289 Personal history of other medical treatment: Secondary | ICD-10-CM

## 2015-03-26 HISTORY — DX: Personal history of other medical treatment: Z92.89

## 2015-04-07 ENCOUNTER — Inpatient Hospital Stay (HOSPITAL_COMMUNITY)
Admission: EM | Admit: 2015-04-07 | Discharge: 2015-04-11 | DRG: 377 | Disposition: A | Discharge status: Home / Self Care | Payer: Medicare Other | Attending: Internal Medicine | Admitting: Internal Medicine

## 2015-04-07 ENCOUNTER — Encounter (HOSPITAL_COMMUNITY): Payer: Self-pay | Admitting: Emergency Medicine

## 2015-04-07 DIAGNOSIS — I482 Chronic atrial fibrillation: Secondary | ICD-10-CM

## 2015-04-07 DIAGNOSIS — M109 Gout, unspecified: Secondary | ICD-10-CM | POA: Diagnosis present

## 2015-04-07 DIAGNOSIS — T45515A Adverse effect of anticoagulants, initial encounter: Secondary | ICD-10-CM | POA: Diagnosis present

## 2015-04-07 DIAGNOSIS — I120 Hypertensive chronic kidney disease with stage 5 chronic kidney disease or end stage renal disease: Secondary | ICD-10-CM | POA: Diagnosis present

## 2015-04-07 DIAGNOSIS — N2581 Secondary hyperparathyroidism of renal origin: Secondary | ICD-10-CM | POA: Diagnosis present

## 2015-04-07 DIAGNOSIS — E039 Hypothyroidism, unspecified: Secondary | ICD-10-CM | POA: Diagnosis present

## 2015-04-07 DIAGNOSIS — Z87891 Personal history of nicotine dependence: Secondary | ICD-10-CM | POA: Diagnosis not present

## 2015-04-07 DIAGNOSIS — K922 Gastrointestinal hemorrhage, unspecified: Principal | ICD-10-CM | POA: Diagnosis present

## 2015-04-07 DIAGNOSIS — Z8546 Personal history of malignant neoplasm of prostate: Secondary | ICD-10-CM | POA: Diagnosis not present

## 2015-04-07 DIAGNOSIS — I2510 Atherosclerotic heart disease of native coronary artery without angina pectoris: Secondary | ICD-10-CM | POA: Diagnosis present

## 2015-04-07 DIAGNOSIS — D62 Acute posthemorrhagic anemia: Secondary | ICD-10-CM | POA: Diagnosis present

## 2015-04-07 DIAGNOSIS — D6832 Hemorrhagic disorder due to extrinsic circulating anticoagulants: Secondary | ICD-10-CM | POA: Diagnosis present

## 2015-04-07 DIAGNOSIS — Z992 Dependence on renal dialysis: Secondary | ICD-10-CM | POA: Diagnosis not present

## 2015-04-07 DIAGNOSIS — Z7982 Long term (current) use of aspirin: Secondary | ICD-10-CM

## 2015-04-07 DIAGNOSIS — I5022 Chronic systolic (congestive) heart failure: Secondary | ICD-10-CM | POA: Diagnosis present

## 2015-04-07 DIAGNOSIS — R791 Abnormal coagulation profile: Secondary | ICD-10-CM

## 2015-04-07 DIAGNOSIS — E785 Hyperlipidemia, unspecified: Secondary | ICD-10-CM | POA: Diagnosis present

## 2015-04-07 DIAGNOSIS — M898X9 Other specified disorders of bone, unspecified site: Secondary | ICD-10-CM | POA: Diagnosis present

## 2015-04-07 DIAGNOSIS — N186 End stage renal disease: Secondary | ICD-10-CM | POA: Diagnosis present

## 2015-04-07 DIAGNOSIS — I252 Old myocardial infarction: Secondary | ICD-10-CM

## 2015-04-07 DIAGNOSIS — Z955 Presence of coronary angioplasty implant and graft: Secondary | ICD-10-CM | POA: Diagnosis not present

## 2015-04-07 DIAGNOSIS — Z95810 Presence of automatic (implantable) cardiac defibrillator: Secondary | ICD-10-CM

## 2015-04-07 DIAGNOSIS — K921 Melena: Secondary | ICD-10-CM | POA: Diagnosis present

## 2015-04-07 DIAGNOSIS — Z7901 Long term (current) use of anticoagulants: Secondary | ICD-10-CM

## 2015-04-07 DIAGNOSIS — Z96659 Presence of unspecified artificial knee joint: Secondary | ICD-10-CM | POA: Diagnosis present

## 2015-04-07 DIAGNOSIS — I480 Paroxysmal atrial fibrillation: Secondary | ICD-10-CM | POA: Diagnosis not present

## 2015-04-07 DIAGNOSIS — D500 Iron deficiency anemia secondary to blood loss (chronic): Secondary | ICD-10-CM

## 2015-04-07 DIAGNOSIS — Z79899 Other long term (current) drug therapy: Secondary | ICD-10-CM | POA: Diagnosis not present

## 2015-04-07 DIAGNOSIS — D696 Thrombocytopenia, unspecified: Secondary | ICD-10-CM | POA: Diagnosis present

## 2015-04-07 DIAGNOSIS — I4891 Unspecified atrial fibrillation: Secondary | ICD-10-CM | POA: Diagnosis present

## 2015-04-07 LAB — PROTIME-INR
INR: 5.77 (ref 0.00–1.49)
Prothrombin Time: 49.7 seconds — ABNORMAL HIGH (ref 11.6–15.2)

## 2015-04-07 LAB — CBC
HCT: 28.2 % — ABNORMAL LOW (ref 39.0–52.0)
Hemoglobin: 9.1 g/dL — ABNORMAL LOW (ref 13.0–17.0)
MCH: 29.6 pg (ref 26.0–34.0)
MCHC: 32.3 g/dL (ref 30.0–36.0)
MCV: 91.9 fL (ref 78.0–100.0)
Platelets: 132 10*3/uL — ABNORMAL LOW (ref 150–400)
RBC: 3.07 MIL/uL — ABNORMAL LOW (ref 4.22–5.81)
RDW: 20.2 % — ABNORMAL HIGH (ref 11.5–15.5)
WBC: 7.2 10*3/uL (ref 4.0–10.5)

## 2015-04-07 LAB — CBC WITH DIFFERENTIAL/PLATELET
Basophils Absolute: 0 10*3/uL (ref 0.0–0.1)
Basophils Relative: 0 % (ref 0–1)
Eosinophils Absolute: 0.1 10*3/uL (ref 0.0–0.7)
Eosinophils Relative: 2 % (ref 0–5)
HEMATOCRIT: 24.8 % — AB (ref 39.0–52.0)
HEMOGLOBIN: 7.9 g/dL — AB (ref 13.0–17.0)
Lymphocytes Relative: 22 % (ref 12–46)
Lymphs Abs: 1.1 10*3/uL (ref 0.7–4.0)
MCH: 29.5 pg (ref 26.0–34.0)
MCHC: 31.9 g/dL (ref 30.0–36.0)
MCV: 92.5 fL (ref 78.0–100.0)
Monocytes Absolute: 0.7 10*3/uL (ref 0.1–1.0)
Monocytes Relative: 13 % — ABNORMAL HIGH (ref 3–12)
NEUTROS ABS: 3.1 10*3/uL (ref 1.7–7.7)
Neutrophils Relative %: 62 % (ref 43–77)
PLATELETS: 106 10*3/uL — AB (ref 150–400)
RBC: 2.68 MIL/uL — ABNORMAL LOW (ref 4.22–5.81)
RDW: 20.7 % — ABNORMAL HIGH (ref 11.5–15.5)
WBC: 5.1 10*3/uL (ref 4.0–10.5)

## 2015-04-07 LAB — RETICULOCYTES
RBC.: 2.68 MIL/uL — ABNORMAL LOW (ref 4.22–5.81)
RETIC COUNT ABSOLUTE: 166.2 10*3/uL (ref 19.0–186.0)
Retic Ct Pct: 6.2 % — ABNORMAL HIGH (ref 0.4–3.1)

## 2015-04-07 LAB — COMPREHENSIVE METABOLIC PANEL
ALT: 34 U/L (ref 17–63)
AST: 34 U/L (ref 15–41)
Albumin: 3.4 g/dL — ABNORMAL LOW (ref 3.5–5.0)
Alkaline Phosphatase: 136 U/L — ABNORMAL HIGH (ref 38–126)
Anion gap: 11 (ref 5–15)
BUN: 18 mg/dL (ref 6–20)
CO2: 32 mmol/L (ref 22–32)
Calcium: 9.1 mg/dL (ref 8.9–10.3)
Chloride: 99 mmol/L — ABNORMAL LOW (ref 101–111)
Creatinine, Ser: 3.26 mg/dL — ABNORMAL HIGH (ref 0.61–1.24)
GFR calc Af Amer: 19 mL/min — ABNORMAL LOW (ref >60)
GFR calc non Af Amer: 16 mL/min — ABNORMAL LOW (ref >60)
Glucose, Bld: 98 mg/dL (ref 65–99)
Potassium: 3.4 mmol/L — ABNORMAL LOW (ref 3.5–5.1)
Sodium: 142 mmol/L (ref 135–145)
Total Bilirubin: 0.7 mg/dL (ref 0.3–1.2)
Total Protein: 6.9 g/dL (ref 6.5–8.1)

## 2015-04-07 LAB — POC OCCULT BLOOD, ED: Fecal Occult Bld: POSITIVE — AB

## 2015-04-07 LAB — MRSA PCR SCREENING: MRSA by PCR: NEGATIVE

## 2015-04-07 LAB — LACTIC ACID, PLASMA: Lactic Acid, Venous: 2.2 mmol/L (ref 0.5–2.0)

## 2015-04-07 MED ORDER — MIDODRINE HCL 5 MG PO TABS
5.0000 mg | ORAL_TABLET | ORAL | Status: DC
Start: 2015-04-09 — End: 2015-04-11
  Administered 2015-04-09 – 2015-04-11 (×2): 5 mg via ORAL
  Filled 2015-04-07 (×2): qty 1

## 2015-04-07 MED ORDER — ONDANSETRON HCL 4 MG/2ML IJ SOLN: 4.0000 mg | Freq: Four times a day (QID) | INTRAMUSCULAR | Status: DC | PRN

## 2015-04-07 MED ORDER — AMIODARONE HCL 200 MG PO TABS
200.0000 mg | ORAL_TABLET | Freq: Every day | ORAL | Status: DC
  Administered 2015-04-08 – 2015-04-11 (×4): 200 mg via ORAL
  Filled 2015-04-07 (×4): qty 1

## 2015-04-07 MED ORDER — PANTOPRAZOLE SODIUM 40 MG IV SOLR
40.0000 mg | Freq: Two times a day (BID) | INTRAVENOUS | Status: DC
  Administered 2015-04-08 – 2015-04-09 (×3): 40 mg via INTRAVENOUS
  Filled 2015-04-07 (×4): qty 40

## 2015-04-07 MED ORDER — ACETAMINOPHEN 325 MG PO TABS: 650.0000 mg | ORAL_TABLET | Freq: Four times a day (QID) | ORAL | Status: DC | PRN

## 2015-04-07 MED ORDER — SODIUM CHLORIDE 0.9 % IV BOLUS (SEPSIS)
500.0000 mL | Freq: Once | INTRAVENOUS | Status: AC
  Administered 2015-04-07: 500 mL via INTRAVENOUS

## 2015-04-07 MED ORDER — VITAMIN K1 10 MG/ML IJ SOLN
10.0000 mg | Freq: Once | INTRAVENOUS | Status: AC
  Administered 2015-04-07: 10 mg via INTRAVENOUS
  Filled 2015-04-07 (×2): qty 1

## 2015-04-07 MED ORDER — ALLOPURINOL 100 MG PO TABS
100.0000 mg | ORAL_TABLET | Freq: Every day | ORAL | Status: DC
  Administered 2015-04-07 – 2015-04-11 (×5): 100 mg via ORAL
  Filled 2015-04-07 (×5): qty 1

## 2015-04-07 MED ORDER — LEVOTHYROXINE SODIUM 25 MCG PO TABS
25.0000 ug | ORAL_TABLET | Freq: Every day | ORAL | Status: DC
  Administered 2015-04-08 – 2015-04-11 (×4): 25 ug via ORAL
  Filled 2015-04-07 (×5): qty 1

## 2015-04-07 MED ORDER — ACETAMINOPHEN 650 MG RE SUPP: 650.0000 mg | Freq: Four times a day (QID) | RECTAL | Status: DC | PRN

## 2015-04-07 MED ORDER — SODIUM CHLORIDE 0.9 % IV SOLN
80.0000 mg | Freq: Once | INTRAVENOUS | Status: AC
  Administered 2015-04-07: 80 mg via INTRAVENOUS
  Filled 2015-04-07: qty 80

## 2015-04-07 MED ORDER — ATORVASTATIN CALCIUM 40 MG PO TABS
40.0000 mg | ORAL_TABLET | Freq: Every day | ORAL | Status: DC
  Administered 2015-04-08 – 2015-04-11 (×4): 40 mg via ORAL
  Filled 2015-04-07 (×4): qty 1

## 2015-04-07 MED ORDER — ONDANSETRON HCL 4 MG PO TABS: 4.0000 mg | ORAL_TABLET | Freq: Four times a day (QID) | ORAL | Status: DC | PRN

## 2015-04-07 NOTE — ED Notes (Signed)
Pt reports dark stools starting last week that has progressively gotten worse. Sent here from dialysis. Pt sts he feels more sleepy.

## 2015-04-07 NOTE — Progress Notes (Addendum)
CRITICAL VALUE ALERT  Critical value received:  Lactic acid 2.2  Date of notification:  04/07/15  Time of notification:  10:21  Critical value read back: Yes  Nurse who received alert:  Eulis Foster  MD notified (1st page):  Raliegh Ip Schorr  Time of first page:  10:40  MD notified (2nd page): Raliegh Ip. Schorr  Time of second page: 11:10  Responding MD: K.Schorr  Time MD responded:  11:18

## 2015-04-07 NOTE — H&P (Signed)
[facility] History and Physical  Patient: [name]  MRN: [id number]  DOB: 01/27/1930  DOS: the patient was seen and examined on 04/07/2015 PCP: [name], MD  Referring: [facility] Chief Complaint: Abnormal lab  HPI: [name] is a 81 y.o. male with Past medical history of ESRD on hemodialysis Monday Wednesday Friday, chronic systolic CHF ejection fraction 35-40%, dyslipidemia, hypothyroidism, coronary artery disease, atrial fibrillation on chronic anticoagulation CHADVASC 5, chronic anemia secondary to renal disease. The patient is presenting with complaints of abnormal lab. Patient went to his hemodialysis session today and was found to have hemoglobin of 7. Reportedly earlier at dialysis Center his hemoglobin was 11. Patient mentions that he has 3 loose black color bowel movement with some brown stool which was Saturday and Sunday. He also had some abdominal pain which was located in the left side which is resolved at present. The pain was not severe and was just discomfort. He also has complains of tenesmus present since last one month. He denies any fever or chills denies any shortness of breath denies any chest pain or chest heaviness or chest tightness, denies any nausea or vomiting, denies similar bleeding in the past. He denies any recent change in his medications or recent antibiotics use. He mentions he has completed his hemodialysis session today. He has on and off swelling in his legs. He denies any focal deficit.  The patient is coming from home. And at his baseline independent for most of his ADL.  Review of Systems: as mentioned in the history of present illness.  A comprehensive review of the other systems is negative.  Past Medical History  Diagnosis Date  . Dyslipidemia   . CHF (congestive heart failure), NYHA class II   . Atrial fibrillation   . Prostate cancer   . Depression   . Shortness of breath   . Gout   . Myocardial  infarction   . Hypothyroidism   . Chronic kidney disease     . Hemodialysis TTS  . Arthritis   . ICD (implantable cardiac defibrillator) in place   . Coronary artery disease     Sees Dr. Lovena Le, [facility] cardiology  . Hypertension     sees Dr. Katherine Roan   Past Surgical History  Procedure Laterality Date  . Medtronic biv icd  10/2006  . Pci/stent  02/2005  . Total knee arthroplasty    . Av fistula placement  10/13/2012    Procedure: ARTERIOVENOUS (AV) FISTULA CREATION;  Surgeon: Mal Misty, MD;  Location: [place];  Service: Vascular;  Laterality: Right;  Creation right arm arteriovenous fistula   . Insertion of dialysis catheter  10/13/2012    Procedure: INSERTION OF DIALYSIS CATHETER;  Surgeon: Mal Misty, MD;  Location: [place];  Service: Vascular;  Laterality: N/A;  right internal jugular vein  . Cardiac catheterization    . Ligation of competing branches of arteriovenous fistula  11/24/2012    Procedure: LIGATION OF COMPETING BRANCHES OF ARTERIOVENOUS FISTULA;  Surgeon: Rosetta Posner, MD;  Location: [facility] OR;  Service: Vascular;  Laterality: Right;  ligation of competing branch right arteriovenous fistula  . Av fistula placement Left 01/08/2013    Procedure: ARTERIOVENOUS (AV) FISTULA CREATION;  Surgeon: Elam Dutch, MD;  Location: [place];  Service: Vascular;  Laterality: Left;  . Cardioversion N/A 07/21/2012    Procedure: CARDIOVERSION;  Surgeon: Evans Lance, MD;  Location: [facility] CATH LAB;  Service: Cardiovascular;  Laterality: N/A;  Social History:  reports that he quit smoking about 46 years ago. His smoking use included Cigarettes and Cigars. He has never used smokeless tobacco. He reports that he does not drink alcohol or use illicit drugs.  No Known Allergies  Family History  Problem Relation Age of Onset  . Diabetes Mother     Prior to Admission medications   Medication Sig Start Date End Date Taking? Authorizing Provider  allopurinol (ZYLOPRIM) 100 MG tablet Take  100 mg by mouth daily.  06/12/12  Yes Historical Provider, MD  amiodarone (PACERONE) 200 MG tablet Take 200 mg by mouth daily.    Yes Historical Provider, MD  atorvastatin (LIPITOR) 40 MG tablet Take 40 mg by mouth daily.     Yes Historical Provider, MD  Calcium Acetate, Phos Binder, (CALCIUM ACETATE PO) Take 2 capsules by mouth daily.   Yes Historical Provider, MD  levothyroxine (SYNTHROID, LEVOTHROID) 25 MCG tablet Take 1 tablet (25 mcg total) by mouth daily before breakfast. 10/18/12  Yes Charolette Forward, MD  midodrine (PROAMATINE) 5 MG tablet Take 1 tablet three days a week on Monday, Wednesday and Friday. 07/20/13  Yes Historical Provider, MD  Multiple Vitamin (MULTIVITAMIN WITH MINERALS) TABS Take 1 tablet by mouth daily.   Yes Historical Provider, MD  warfarin (COUMADIN) 2 MG tablet Take 2-4 mg by mouth See admin instructions. Pt takes 2mg  on Sundays, pt takes 4mg  all other days of week   Yes Historical Provider, MD    Physical Exam: Filed Vitals:   04/07/15 1800 04/07/15 1815 04/07/15 1900 04/07/15 1915  BP: 97/41 101/48 113/47 109/50  Pulse: 59 59 58 59  Temp:      TempSrc:      Resp:   14 15  Height:      Weight:      SpO2: 100% 100% 100% 100%    General: Alert, Awake and Oriented to Time, Place and Person. Appear in mild distress Eyes: PERRL ENT: Oral Mucosa clear moist. Neck: no JVD Cardiovascular: S1 and S2 Present, no Murmur, Peripheral Pulses Present Respiratory: Bilateral Air entry equal and Decreased,  Clear to Auscultation, no Crackles, no wheezes Abdomen: Bowel Sound present, Soft and non tender Skin: no Rash Extremities: Bilateral Pedal edema, no calf tenderness Neurologic: Grossly no focal neuro deficit.  Labs on Admission:  CBC:  Recent Labs Lab 04/07/15 1615  WBC 7.2  HGB 9.1*  HCT 28.2*  MCV 91.9  PLT 132*    CMP     Component Value Date/Time   NA 142 04/07/2015 1615   K 3.4* 04/07/2015 1615   CL 99* 04/07/2015 1615   CO2 32 04/07/2015 1615    GLUCOSE 98 04/07/2015 1615   GLUCOSE 117* 11/04/2006 1154   BUN 18 04/07/2015 1615   CREATININE 3.26* 04/07/2015 1615   CALCIUM 9.1 04/07/2015 1615   PROT 6.9 04/07/2015 1615   ALBUMIN 3.4* 04/07/2015 1615   AST 34 04/07/2015 1615   ALT 34 04/07/2015 1615   ALKPHOS 136* 04/07/2015 1615   BILITOT 0.7 04/07/2015 1615   GFRNONAA 16* 04/07/2015 1615   GFRAA 19* 04/07/2015 1615    Assessment/Plan Principal Problem:   Melena Active Problems:   Atrial fibrillation   Chronic systolic heart failure   Automatic implantable cardioverter-defibrillator in situ   End stage renal disease   GI bleed   Supratherapeutic INR   1. Melena The patient is presenting with complaints of abdominal pain followed by 3 loose watery black color bowel movement. He had  developed anemia reportedly dropping from baseline 11 hemoglobin to 7 today. He had his hemoglobin is 9. No active bleeding reported in the ER but he did have Hemoccult-positive rectal exam. He does not have any abdominal tenderness and appears hemodynamically stable at present. He has received vitamin K in the ER. I'll give him Protonix 80 mg bolus and start him on Protonix every 12 hours 40 mg. He'll be monitored in stepdown unit. Recheck INR at midnight. CBC every 4 hours. ED physician will Discuss with gastroenterology. Patient remains nothing by mouth at present other than nice chips and sips of water.  2. ESRD on hemodialysis Monday Wednesday Friday. Patient has completed his Monday hemodialysis treatment. Nephrology should be considered if should the patient remain in the hospital on Wednesday. Renal dozing off medications.  3. Atrial fibrillation with Chadvasc score of 5 Currently the patient has paced rhythm. Continue amiodarone daily as per patient. Patient does not take beta blockers. Holding warfarin at present.  4. Chronic systolic heart failure. Edema. The patient has chronic systolic heart failure and has completed  his hemodialysis treatment at present. Does not appear volume overloaded. Patient does have some swelling of the legs which is chronic for him. Continue close monitoring.  5. AICD implant. Patient has paced rhythm. No history of ESRD discharge. Continue monitoring.  Advance goals of care discussion: Full code as per my discussion with patient. Patient's wife will be his proxy, although there is no official documentation per patient.  Consults: ED discussed with gastroenterology.  DVT Prophylaxis: mechanical compression device Nutrition: Nothing by mouth except medications and ice chips  Disposition: Admitted as unpatient, step-down unit.  Author: Berle Mull, MD [facility] Pager: [phone] 04/07/2015  If 7PM-7AM, please contact night-coverage [email or web address] Password [email or web address]

## 2015-04-07 NOTE — ED Provider Notes (Signed)
CSN: [id number]     Arrival date & time 04/07/15  1603 History   First MD Initiated Contact with Patient 04/07/15 1657     Chief Complaint  Patient presents with  . Melena     (Consider location/radiation/quality/duration/timing/severity/associated sxs/prior Treatment) HPI   84yM sent from dialysis for evaluation of worsening anemia. Patient's hemoglobin apparently dropped from 11 g/dL to 7 over the course of the past 2 weeks. Patient reports melena over about the same time period. He is on warfarin for history of visual fibrillation.HAs felt fatigued and sleeping much more than he normally does. No presyncopal symptoms. No dyspnea. Denies hx of GI bleed.   Past Medical History  Diagnosis Date  . Dyslipidemia   . CHF (congestive heart failure), NYHA class II   . Atrial fibrillation   . Prostate cancer   . Depression   . Shortness of breath   . Gout   . Myocardial infarction   . Hypothyroidism   . Chronic kidney disease     . Hemodialysis TTS  . Arthritis   . ICD (implantable cardiac defibrillator) in place   . Coronary artery disease     Sees Dr. Lovena Le, [facility] cardiology  . Hypertension     sees Dr. Katherine Roan   Past Surgical History  Procedure Laterality Date  . Medtronic biv icd  10/2006  . Pci/stent  02/2005  . Total knee arthroplasty    . Av fistula placement  10/13/2012    Procedure: ARTERIOVENOUS (AV) FISTULA CREATION;  Surgeon: Mal Misty, MD;  Location: [place];  Service: Vascular;  Laterality: Right;  Creation right arm arteriovenous fistula   . Insertion of dialysis catheter  10/13/2012    Procedure: INSERTION OF DIALYSIS CATHETER;  Surgeon: Mal Misty, MD;  Location: [place];  Service: Vascular;  Laterality: N/A;  right internal jugular vein  . Cardiac catheterization    . Ligation of competing branches of arteriovenous fistula  11/24/2012    Procedure: LIGATION OF COMPETING BRANCHES OF ARTERIOVENOUS FISTULA;  Surgeon: Rosetta Posner, MD;  Location: [facility] OR;   Service: Vascular;  Laterality: Right;  ligation of competing branch right arteriovenous fistula  . Av fistula placement Left 01/08/2013    Procedure: ARTERIOVENOUS (AV) FISTULA CREATION;  Surgeon: Elam Dutch, MD;  Location: [place];  Service: Vascular;  Laterality: Left;  . Cardioversion N/A 07/21/2012    Procedure: CARDIOVERSION;  Surgeon: Evans Lance, MD;  Location: [facility] CATH LAB;  Service: Cardiovascular;  Laterality: N/A;   Family History  Problem Relation Age of Onset  . Diabetes Mother    History  Substance Use Topics  . Smoking status: Former Smoker    Types: Cigarettes, Cigars    Quit date: 10/25/1968  . Smokeless tobacco: Never Used     Comment: stopped smoking in 1970  . Alcohol Use: No    Review of Systems  All systems reviewed and negative, other than as noted in HPI.   Allergies  Review of patient's allergies indicates no known allergies.  Home Medications   Prior to Admission medications   Medication Sig Start Date End Date Taking? Authorizing Provider  allopurinol (ZYLOPRIM) 100 MG tablet Take 100 mg by mouth daily.  06/12/12   Historical Provider, MD  atorvastatin (LIPITOR) 40 MG tablet Take 40 mg by mouth daily.      Historical Provider, MD  Calcium Acetate, Phos Binder, (CALCIUM ACETATE PO) Take 2 capsules by mouth daily.    Historical Provider, MD  carvedilol (COREG) 3.125 MG tablet Take 1 tablet by mouth daily. 06/27/14   Historical Provider, MD  levothyroxine (SYNTHROID, LEVOTHROID) 25 MCG tablet Take 1 tablet (25 mcg total) by mouth daily before breakfast. 10/18/12   Charolette Forward, MD  midodrine (PROAMATINE) 5 MG tablet Take 1 tablet three days a week on Monday, Wednesday and Friday. 07/20/13   Historical Provider, MD  Multiple Vitamin (MULTIVITAMIN WITH MINERALS) TABS Take 1 tablet by mouth daily.    Historical Provider, MD  warfarin (COUMADIN) 3 MG tablet Take 1 tablet daily except take 1 1/2 tablets on Sundays    Historical Provider, MD   BP 100/46  mmHg  Pulse 60  Temp(Src) 97.6 F (36.4 C) (Oral)  Resp 14  Ht 5\' 11"  (1.803 m)  Wt 175 lb 1.6 oz (79.425 kg)  BMI 24.43 kg/m2  SpO2 100% Physical Exam  Constitutional: He appears well-developed and well-nourished. No distress.  HENT:  Head: Normocephalic and atraumatic.  Eyes: Conjunctivae are normal. Right eye exhibits no discharge. Left eye exhibits no discharge.  Neck: Neck supple.  Cardiovascular: Normal rate, regular rhythm and normal heart sounds.  Exam reveals no gallop and no friction rub.   No murmur heard. fistula LUE with palpable thrill  Pulmonary/Chest: Effort normal and breath sounds normal. No respiratory distress.  Abdominal: Soft. He exhibits no distension. There is no tenderness.  Genitourinary:  Melena on DRE  Musculoskeletal: He exhibits no edema or tenderness.  Neurological: He is alert.  Skin: Skin is warm and dry.  Psychiatric: He has a normal mood and affect. His behavior is normal. Thought content normal.  Nursing note and vitals reviewed.   ED Course  Procedures (including critical care time) Labs Review Labs Reviewed  CBC - Abnormal; Notable for the following:    RBC 3.07 (*)    Hemoglobin 9.1 (*)    HCT 28.2 (*)    RDW 20.2 (*)    Platelets 132 (*)    All other components within normal limits  COMPREHENSIVE METABOLIC PANEL - Abnormal; Notable for the following:    Potassium 3.4 (*)    Chloride 99 (*)    Creatinine, Ser 3.26 (*)    Albumin 3.4 (*)    Alkaline Phosphatase 136 (*)    GFR calc non Af Amer 16 (*)    GFR calc Af Amer 19 (*)    All other components within normal limits  POC OCCULT BLOOD, ED  TYPE AND SCREEN    Imaging Review No results found.   EKG Interpretation None      MDM   Final diagnoses:  Melena  Blood loss anemia  Supratherapeutic INR   84yM with melena. On warfarin. INR 5.77. No specific complaints aside from fatigue. Soft BP but, per review of records and pt report, he normally runs close to this  range. He does not appear ill. Vitamin K given, but FFP deferred at this time. Reportedly hemoglobin 7 at dialysis today. No records sent with him though. Most recent labs in our system are from over 2 years ago. Admission for further evaluation.   Vitals - 1 value per visit 04/07/2015 07/16/2014 06/08/2014 2/87/8676  SYSTOLIC 97 [phone]  DIASTOLIC 41 56 52 57   Vitals - 1 value per visit 03/22/2013 02/15/2013 2/83/6629  SYSTOLIC 476 99 546  DIASTOLIC 57 [place]     Virgel Manifold, MD 04/11/15 1127

## 2015-04-07 NOTE — ED Notes (Signed)
Dialysis RN reports patient has dropped his hgb from 11 to 7 since 6/1 to 6/13.

## 2015-04-08 DIAGNOSIS — I5022 Chronic systolic (congestive) heart failure: Secondary | ICD-10-CM

## 2015-04-08 DIAGNOSIS — K921 Melena: Secondary | ICD-10-CM

## 2015-04-08 DIAGNOSIS — N186 End stage renal disease: Secondary | ICD-10-CM

## 2015-04-08 DIAGNOSIS — I4891 Unspecified atrial fibrillation: Secondary | ICD-10-CM

## 2015-04-08 LAB — CBC
HCT: 22.6 % — ABNORMAL LOW (ref 39.0–52.0)
HCT: 28.3 % — ABNORMAL LOW (ref 39.0–52.0)
HEMOGLOBIN: 7.3 g/dL — AB (ref 13.0–17.0)
Hemoglobin: 9.2 g/dL — ABNORMAL LOW (ref 13.0–17.0)
MCH: 29.9 pg (ref 26.0–34.0)
MCH: 29.3 pg (ref 26.0–34.0)
MCHC: 32.3 g/dL (ref 30.0–36.0)
MCHC: 32.5 g/dL (ref 30.0–36.0)
MCV: 92.6 fL (ref 78.0–100.0)
MCV: 90.1 fL (ref 78.0–100.0)
PLATELETS: 107 10*3/uL — AB (ref 150–400)
Platelets: 99 10*3/uL — ABNORMAL LOW (ref 150–400)
RBC: 2.44 MIL/uL — AB (ref 4.22–5.81)
RBC: 3.14 MIL/uL — ABNORMAL LOW (ref 4.22–5.81)
RDW: 20.7 % — ABNORMAL HIGH (ref 11.5–15.5)
RDW: 21.3 % — AB (ref 11.5–15.5)
WBC: 5.1 10*3/uL (ref 4.0–10.5)
WBC: 6.9 10*3/uL (ref 4.0–10.5)

## 2015-04-08 LAB — PROTIME-INR
INR: 3.17 — ABNORMAL HIGH (ref 0.00–1.49)
INR: 1.73 — AB (ref 0.00–1.49)
PROTHROMBIN TIME: 31.9 s — AB (ref 11.6–15.2)
Prothrombin Time: 20.3 seconds — ABNORMAL HIGH (ref 11.6–15.2)

## 2015-04-08 LAB — DIFFERENTIAL
Basophils Absolute: 0 10*3/uL (ref 0.0–0.1)
Basophils Relative: 0 % (ref 0–1)
EOS PCT: 4 % (ref 0–5)
Eosinophils Absolute: 0.2 10*3/uL (ref 0.0–0.7)
LYMPHS ABS: 1.3 10*3/uL (ref 0.7–4.0)
LYMPHS PCT: 25 % (ref 12–46)
MONO ABS: 0.7 10*3/uL (ref 0.1–1.0)
Monocytes Relative: 13 % — ABNORMAL HIGH (ref 3–12)
NEUTROS ABS: 2.9 10*3/uL (ref 1.7–7.7)
Neutrophils Relative %: 58 % (ref 43–77)

## 2015-04-08 LAB — RENAL FUNCTION PANEL
ALBUMIN: 2.7 g/dL — AB (ref 3.5–5.0)
Anion gap: 10 (ref 5–15)
BUN: 26 mg/dL — AB (ref 6–20)
CALCIUM: 8.7 mg/dL — AB (ref 8.9–10.3)
CO2: 31 mmol/L (ref 22–32)
Chloride: 102 mmol/L (ref 101–111)
Creatinine, Ser: 3.96 mg/dL — ABNORMAL HIGH (ref 0.61–1.24)
GFR calc Af Amer: 15 mL/min — ABNORMAL LOW (ref >60)
GFR calc non Af Amer: 13 mL/min — ABNORMAL LOW (ref >60)
GLUCOSE: 112 mg/dL — AB (ref 65–99)
PHOSPHORUS: 2.9 mg/dL (ref 2.5–4.6)
Potassium: 3.7 mmol/L (ref 3.5–5.1)
SODIUM: 143 mmol/L (ref 135–145)

## 2015-04-08 LAB — LACTIC ACID, PLASMA: Lactic Acid, Venous: 1.7 mmol/L (ref 0.5–2.0)

## 2015-04-08 LAB — FIBRINOGEN: Fibrinogen: 292 mg/dL (ref 204–475)

## 2015-04-08 LAB — PREPARE RBC (CROSSMATCH)

## 2015-04-08 MED ORDER — DARBEPOETIN ALFA 60 MCG/0.3ML IJ SOSY
60.0000 ug | PREFILLED_SYRINGE | INTRAMUSCULAR | Status: DC
  Administered 2015-04-09: 60 ug via INTRAVENOUS
  Filled 2015-04-08: qty 0.3

## 2015-04-08 MED ORDER — DOXERCALCIFEROL 4 MCG/2ML IV SOLN
7.0000 ug | INTRAVENOUS | Status: DC
  Administered 2015-04-09 – 2015-04-11 (×2): 7 ug via INTRAVENOUS
  Filled 2015-04-08 (×2): qty 4

## 2015-04-08 MED ORDER — RENA-VITE PO TABS
1.0000 | ORAL_TABLET | Freq: Every day | ORAL | Status: DC
  Administered 2015-04-08 – 2015-04-10 (×3): 1 via ORAL
  Filled 2015-04-08 (×4): qty 1

## 2015-04-08 MED ORDER — SODIUM CHLORIDE 0.9 % IV SOLN
Freq: Once | INTRAVENOUS | Status: AC
  Administered 2015-04-08: 02:00:00 via INTRAVENOUS

## 2015-04-08 MED ORDER — NA FERRIC GLUC CPLX IN SUCROSE 12.5 MG/ML IV SOLN
125.0000 mg | INTRAVENOUS | Status: DC
  Administered 2015-04-09 – 2015-04-11 (×2): 125 mg via INTRAVENOUS
  Filled 2015-04-08 (×4): qty 10

## 2015-04-08 NOTE — Progress Notes (Signed)
Patient's INR has come back down to 1.7. No endo staff available for nonurgent EGD at this time will go ahead and allow the patient to have clear liquids and is scheduled for EGD tomorrow at 11 AM. Will check INR in the morning.

## 2015-04-08 NOTE — Progress Notes (Addendum)
[facility] Progress Note   [name] LDJ:[id number] DOB: 1930/01/02 DOA: 04/07/2015 PCP: [name], MD  Brief narrative: [name] is a 81 y.o. male with past medical history of atrial fibrillation on Coumadin, end-stage renal disease who presents with black stools in setting of an INR of 5.77   Subjective: Last episode of black stool was 8 AM this morning. No complaints of abdominal pain nausea or vomiting.  Assessment/Plan: Principal Problem:   Melena -GI has evaluated the patient and will perform an EGD once INR has corrected -Continue twice a day IV Protonix  Active Problems:   Atrial fibrillation -Coumadin reversed-currently in sinus rhythm-continue amiodarone    Chronic systolic heart failure -Fluid management per nephrology  End-stage renal disease - dialysis Monday Wednesday Friday-nephrology consulted    Automatic implantable cardioverter-defibrillator in situ  Code Status: Full code Disposition Plan: EGD today if INR corrects DVT prophylaxis: SCDs Consultants: GI Procedures:  Antibiotics: Anti-infectives    None      Objective: Filed Weights   04/07/15 1952 04/08/15 0033 04/08/15 0346  Weight: 78.6 kg (173 lb 4.5 oz) 78.9 kg (173 lb 15.1 oz) 78.9 kg (173 lb 15.1 oz)    Intake/Output Summary (Last 24 hours) at 04/08/15 1533 Last data filed at 04/08/15 1144  Gross per 24 hour  Intake   1141 ml  Output      0 ml  Net   1141 ml     Vitals Filed Vitals:   04/08/15 1059 04/08/15 1115 04/08/15 1144 04/08/15 1219  BP: 113/59 110/53 118/54 113/54  Pulse: 59 61 58   Temp: 98.2 F (36.8 C)  98 F (36.7 C) 98.5 F (36.9 C)  TempSrc:    Oral  Resp: 19 22 13 18   Height:      Weight:      SpO2: 100% 98% 98% 99%    Exam:  General:  Pt is alert, not in acute distress  HEENT: No icterus, No thrush, oral mucosa moist  Cardiovascular: regular rate and rhythm, S1/S2 No murmur  Respiratory: clear to auscultation  bilaterally   Abdomen: Soft, +Bowel sounds, non tender, non distended, no guarding  MSK: No LE edema, cyanosis or clubbing  Data Reviewed: Basic Metabolic Panel:  Recent Labs Lab 04/07/15 1615 04/08/15 0105  NA 142 143  K 3.4* 3.7  CL 99* 102  CO2 32 31  GLUCOSE 98 112*  BUN 18 26*  CREATININE 3.26* 3.96*  CALCIUM 9.1 8.7*  PHOS  --  2.9   Liver Function Tests:  Recent Labs Lab 04/07/15 1615 04/08/15 0105  AST 34  --   ALT 34  --   ALKPHOS 136*  --   BILITOT 0.7  --   PROT 6.9  --   ALBUMIN 3.4* 2.7*   No results for input(s): LIPASE, AMYLASE in the last 168 hours. No results for input(s): AMMONIA in the last 168 hours. CBC:  Recent Labs Lab 04/07/15 1615 04/07/15 2135 04/08/15 0105 04/08/15 1407  WBC 7.2 5.1 5.1 6.9  NEUTROABS  --  3.1 2.9  --   HGB 9.1* 7.9* 7.3* 9.2*  HCT 28.2* 24.8* 22.6* 28.3*  MCV 91.9 92.5 92.6 90.1  PLT 132* 106* 99* 107*   Cardiac Enzymes: No results for input(s): CKTOTAL, CKMB, CKMBINDEX, TROPONINI in the last 168 hours. BNP (last 3 results) No results for input(s): BNP in the last 8760 hours.  ProBNP (last 3 results) No results for input(s): PROBNP in the last  8760 hours.  CBG: No results for input(s): GLUCAP in the last 168 hours.  Recent Results (from the past 240 hour(s))  MRSA PCR Screening     Status: None   Collection Time: 04/07/15  7:55 PM  Result Value Ref Range Status   MRSA by PCR NEGATIVE NEGATIVE Final    Comment:        The GeneXpert MRSA Assay (FDA approved for NASAL specimens only), is one component of a comprehensive MRSA colonization surveillance program. It is not intended to diagnose MRSA infection nor to guide or monitor treatment for MRSA infections.      Studies: No results found.  Scheduled Meds:  Scheduled Meds: . allopurinol  100 mg Oral Daily  . amiodarone  200 mg Oral Daily  . atorvastatin  40 mg Oral Daily  . levothyroxine  25 mcg Oral QAC breakfast  . [START ON  04/09/2015] midodrine  5 mg Oral Once per day on Mon Wed Fri  . pantoprazole (PROTONIX) IV  40 mg Intravenous Q12H   Continuous Infusions:   Time spent on care of this patient: 35 minutes    [place], MD 04/08/2015, 3:33 PM  LOS: 1 day   [facility] Office  [phone] Pager - Text Page per [email or web address] If 7PM-7AM, please contact night-coverage [email or web address]

## 2015-04-08 NOTE — Consult Note (Signed)
Indication for Consultation:  Management of ESRD/hemodialysis; anemia, hypertension/volume and secondary hyperparathyroidism  HPI: [name] is a 81 y.o. male who presented to the ED yesterday for complaints of dark stools and increased fatigue for a few weeks. He recieves HD MWF @ [facility], last HD yesterday, history of HTN, CHF and afib on coumadin.  He reports he first noticed dark stools a few weeks ago, he said he told his cardiologist and was told to follow up with his PCP but his apt was not until 6/30. His hemoglobin had dropped from 11.7 on 6/1 to 9.9 on 6/8 to 7.8 on 6/13 so he was told at his HD center to come to the ED for evaluation, his INR in the ED was 5.77 and stool heme +. He was admitted for further evaluation.  Past Medical History  Diagnosis Date  . Dyslipidemia   . CHF (congestive heart failure), NYHA class II   . Atrial fibrillation   . Prostate cancer   . Depression   . Shortness of breath   . Gout   . Myocardial infarction   . Hypothyroidism   . Chronic kidney disease     . Hemodialysis TTS  . Arthritis   . ICD (implantable cardiac defibrillator) in place   . Coronary artery disease     Sees Dr. Lovena Le, [facility] cardiology  . Hypertension     sees Dr. Katherine Roan   Past Surgical History  Procedure Laterality Date  . Medtronic biv icd  10/2006  . Pci/stent  02/2005  . Total knee arthroplasty    . Av fistula placement  10/13/2012    Procedure: ARTERIOVENOUS (AV) FISTULA CREATION;  Surgeon: Mal Misty, MD;  Location: [place];  Service: Vascular;  Laterality: Right;  Creation right arm arteriovenous fistula   . Insertion of dialysis catheter  10/13/2012    Procedure: INSERTION OF DIALYSIS CATHETER;  Surgeon: Mal Misty, MD;  Location: [place];  Service: Vascular;  Laterality: N/A;  right internal jugular vein  . Cardiac catheterization    . Ligation of competing branches of arteriovenous fistula  11/24/2012    Procedure: LIGATION OF COMPETING BRANCHES OF  ARTERIOVENOUS FISTULA;  Surgeon: Rosetta Posner, MD;  Location: [facility] OR;  Service: Vascular;  Laterality: Right;  ligation of competing branch right arteriovenous fistula  . Av fistula placement Left 01/08/2013    Procedure: ARTERIOVENOUS (AV) FISTULA CREATION;  Surgeon: Elam Dutch, MD;  Location: [place];  Service: Vascular;  Laterality: Left;  . Cardioversion N/A 07/21/2012    Procedure: CARDIOVERSION;  Surgeon: Evans Lance, MD;  Location: [facility] CATH LAB;  Service: Cardiovascular;  Laterality: N/A;   Family History  Problem Relation Age of Onset  . Diabetes Mother    Social History:  reports that he quit smoking about 46 years ago. His smoking use included Cigarettes and Cigars. He has never used smokeless tobacco. He reports that he does not drink alcohol or use illicit drugs. No Known Allergies Prior to Admission medications   Medication Sig Start Date End Date Taking? Authorizing Provider  allopurinol (ZYLOPRIM) 100 MG tablet Take 100 mg by mouth daily.  06/12/12  Yes Historical Provider, MD  amiodarone (PACERONE) 200 MG tablet Take 200 mg by mouth daily.    Yes Historical Provider, MD  atorvastatin (LIPITOR) 40 MG tablet Take 40 mg by mouth daily.     Yes Historical Provider, MD  Calcium Acetate, Phos Binder, (CALCIUM ACETATE PO) Take 2 capsules by mouth daily.  Yes Historical Provider, MD  levothyroxine (SYNTHROID, LEVOTHROID) 25 MCG tablet Take 1 tablet (25 mcg total) by mouth daily before breakfast. 10/18/12  Yes Charolette Forward, MD  midodrine (PROAMATINE) 5 MG tablet Take 1 tablet three days a week on Monday, Wednesday and Friday. 07/20/13  Yes Historical Provider, MD  Multiple Vitamin (MULTIVITAMIN WITH MINERALS) TABS Take 1 tablet by mouth daily.   Yes Historical Provider, MD  warfarin (COUMADIN) 2 MG tablet Take 2-4 mg by mouth See admin instructions. Pt takes 2mg  on Sundays, pt takes 4mg  all other days of week   Yes Historical Provider, MD   Current Facility-Administered Medications   Medication Dose Route Frequency Provider Last Rate Last Dose  . acetaminophen (TYLENOL) tablet 650 mg  650 mg Oral Q6H PRN Lavina Hamman, MD       Or  . acetaminophen (TYLENOL) suppository 650 mg  650 mg Rectal Q6H PRN Lavina Hamman, MD      . allopurinol (ZYLOPRIM) tablet 100 mg  100 mg Oral Daily Lavina Hamman, MD   100 mg at 04/08/15 0943  . amiodarone (PACERONE) tablet 200 mg  200 mg Oral Daily Lavina Hamman, MD   200 mg at 04/08/15 0943  . atorvastatin (LIPITOR) tablet 40 mg  40 mg Oral Daily Lavina Hamman, MD   40 mg at 04/08/15 0943  . levothyroxine (SYNTHROID, LEVOTHROID) tablet 25 mcg  25 mcg Oral QAC breakfast Lavina Hamman, MD   25 mcg at 04/08/15 0850  . [START ON 04/09/2015] midodrine (PROAMATINE) tablet 5 mg  5 mg Oral Once per day on Mon Wed Fri Lavina Hamman, MD      . ondansetron [facility]) tablet 4 mg  4 mg Oral Q6H PRN Lavina Hamman, MD       Or  . ondansetron [facility]) injection 4 mg  4 mg Intravenous Q6H PRN Lavina Hamman, MD      . pantoprazole (PROTONIX) injection 40 mg  40 mg Intravenous Q12H Lavina Hamman, MD   40 mg at 04/08/15 1101   Labs: Basic Metabolic Panel:  Recent Labs Lab 04/07/15 1615 04/08/15 0105  NA 142 143  K 3.4* 3.7  CL 99* 102  CO2 32 31  GLUCOSE 98 112*  BUN 18 26*  CREATININE 3.26* 3.96*  CALCIUM 9.1 8.7*  PHOS  --  2.9   Liver Function Tests:  Recent Labs Lab 04/07/15 1615 04/08/15 0105  AST 34  --   ALT 34  --   ALKPHOS 136*  --   BILITOT 0.7  --   PROT 6.9  --   ALBUMIN 3.4* 2.7*   No results for input(s): LIPASE, AMYLASE in the last 168 hours. No results for input(s): AMMONIA in the last 168 hours. CBC:  Recent Labs Lab 04/07/15 1615 04/07/15 2135 04/08/15 0105 04/08/15 1407  WBC 7.2 5.1 5.1 6.9  NEUTROABS  --  3.1 2.9  --   HGB 9.1* 7.9* 7.3* 9.2*  HCT 28.2* 24.8* 22.6* 28.3*  MCV 91.9 92.5 92.6 90.1  PLT 132* 106* 99* 107*   Cardiac Enzymes: No results for input(s): CKTOTAL, CKMB, CKMBINDEX,  TROPONINI in the last 168 hours. CBG: No results for input(s): GLUCAP in the last 168 hours. Iron Studies: No results for input(s): IRON, TIBC, TRANSFERRIN, FERRITIN in the last 72 hours. Studies/Results: No results found.   Review of Systems: Reports dark stools for a few weeks-denies bright red blood. Denies dark stool today Denies n/v/abd  pain. Good appetite Denies chest pain/sob/palpiations/lightheaded/dizzy Anuric Bilat knee pain r/t arthritis   Physical Exam: Filed Vitals:   04/08/15 1059 04/08/15 1115 04/08/15 1144 04/08/15 1219  BP: 113/59 110/53 118/54 113/54  Pulse: 59 61 58   Temp: 98.2 F (36.8 C)  98 F (36.7 C) 98.5 F (36.9 C)  TempSrc:    Oral  Resp: 19 22 13 18   Height:      Weight:      SpO2: 100% 98% 98% 99%     General: Well developed, well nourished, in no acute distress. Head: Normocephalic, atraumatic, sclera non-icteric, mucus membranes are moist Neck: Supple. JVD not elevated. Lungs: Clear bilaterally to auscultation without wheezes, rales, or rhonchi. Breathing is unlabored. Heart: RRR with S1 S2. No murmurs, rubs, or gallops appreciated. Abdomen: Soft, non-tender, non-distended with normoactive bowel sounds. No rebound/guarding. No obvious abdominal masses. M-S:  Strength and tone appear normal for age. Lower extremities:without edema or ischemic changes, no open wounds  Neuro: Alert and oriented X 3. Moves all extremities spontaneously. Psych:  Responds to questions appropriately with a normal affect. Dialysis Access: L AVf +b/t  Dialysis Orders:  MWF [facility] 4 hr    79kgs   2K/2.25Ca   400/800    NO heparin       Profile 4  hectorol 7 venofer 100 x 10 (last dose 6/22) then 50 mg weekly (start 6/29)    Assessment/Plan: 1.  GIB/anemia-  Heme +stools. GI following- for EGD tomorrow. hgb 9/2 S/p FFP and 2 u RBC. Cont protonix. No ESA- hgb had been stable in the 11s, start low dose tomorrow. Cont Fe bolus until 6/22 for tsat 25 2.  ESRD -  MWF [facility]-  HD pending tomorrow, no heparin in HD 3.  Hypertension/volume  - 113/54 on midodrine, at edw, gains controlled. 4.  Metabolic bone disease -  Ca+ 8.7 last phos 3.4 and PTH 278, cont hectorol and phoslo when diet advanced 5.  Nutrition - alb 2.7clear liquid 6. afib-  amiodarone. Coumadin on hold. INR down to 1.73 from 5.77  Shelle Iron, NP [place] [phone] 04/08/2015, 3:46 PM   Pt seen, examined and agree w A/P as above.  Kelly Splinter MD pager [phone]    cell [phone] 04/08/2015, 5:13 PM

## 2015-04-08 NOTE — Consult Note (Signed)
[facility] Gastroenterology Consultation Note  Referring Provider:   Dr. Berle Mull [facility]) Primary Care Physician:  [name], MD Primary Gastroenterologist:  None  Reason for Consultation:  melena  HPI: [name] is a 81 y.o. male admitted with melena and anemia.  For the past couple weeks, patient has had 1-2 black stools per day.  He takes aspirin and warfarin for cardiac disease and atrial fibrillation.  Found to have Hgb ~7.5, compared to baseline ~8.5.  INR ~5.5 on admission, ~3.2 today.  He has no abdominal pain, nausea, vomiting, dysphagia, hematochezia, hematemesis, change in bowel habits, loss-of-appetite, unintentional weight loss.  Denies prior history of GI bleeding.  Endorses prior endoscopy and colonoscopy over 10 years ago, doesn't recall the results.   Past Medical History  Diagnosis Date  . Dyslipidemia   . CHF (congestive heart failure), NYHA class II   . Atrial fibrillation   . Prostate cancer   . Depression   . Shortness of breath   . Gout   . Myocardial infarction   . Hypothyroidism   . Chronic kidney disease     . Hemodialysis TTS  . Arthritis   . ICD (implantable cardiac defibrillator) in place   . Coronary artery disease     Sees Dr. Lovena Le, [facility] cardiology  . Hypertension     sees Dr. Katherine Roan    Past Surgical History  Procedure Laterality Date  . Medtronic biv icd  10/2006  . Pci/stent  02/2005  . Total knee arthroplasty    . Av fistula placement  10/13/2012    Procedure: ARTERIOVENOUS (AV) FISTULA CREATION;  Surgeon: Mal Misty, MD;  Location: [place];  Service: Vascular;  Laterality: Right;  Creation right arm arteriovenous fistula   . Insertion of dialysis catheter  10/13/2012    Procedure: INSERTION OF DIALYSIS CATHETER;  Surgeon: Mal Misty, MD;  Location: [place];  Service: Vascular;  Laterality: N/A;  right internal jugular vein  . Cardiac catheterization    . Ligation of competing branches of arteriovenous fistula   11/24/2012    Procedure: LIGATION OF COMPETING BRANCHES OF ARTERIOVENOUS FISTULA;  Surgeon: Rosetta Posner, MD;  Location: [facility] OR;  Service: Vascular;  Laterality: Right;  ligation of competing branch right arteriovenous fistula  . Av fistula placement Left 01/08/2013    Procedure: ARTERIOVENOUS (AV) FISTULA CREATION;  Surgeon: Elam Dutch, MD;  Location: [place];  Service: Vascular;  Laterality: Left;  . Cardioversion N/A 07/21/2012    Procedure: CARDIOVERSION;  Surgeon: Evans Lance, MD;  Location: [facility] CATH LAB;  Service: Cardiovascular;  Laterality: N/A;    Prior to Admission medications   Medication Sig Start Date End Date Taking? Authorizing Provider  allopurinol (ZYLOPRIM) 100 MG tablet Take 100 mg by mouth daily.  06/12/12  Yes Historical Provider, MD  amiodarone (PACERONE) 200 MG tablet Take 200 mg by mouth daily.    Yes Historical Provider, MD  atorvastatin (LIPITOR) 40 MG tablet Take 40 mg by mouth daily.     Yes Historical Provider, MD  Calcium Acetate, Phos Binder, (CALCIUM ACETATE PO) Take 2 capsules by mouth daily.   Yes Historical Provider, MD  levothyroxine (SYNTHROID, LEVOTHROID) 25 MCG tablet Take 1 tablet (25 mcg total) by mouth daily before breakfast. 10/18/12  Yes Charolette Forward, MD  midodrine (PROAMATINE) 5 MG tablet Take 1 tablet three days a week on Monday, Wednesday and Friday. 07/20/13  Yes Historical Provider, MD  Multiple Vitamin (MULTIVITAMIN WITH MINERALS) TABS Take  1 tablet by mouth daily.   Yes Historical Provider, MD  warfarin (COUMADIN) 2 MG tablet Take 2-4 mg by mouth See admin instructions. Pt takes 2mg  on Sundays, pt takes 4mg  all other days of week   Yes Historical Provider, MD    Current Facility-Administered Medications  Medication Dose Route Frequency Provider Last Rate Last Dose  . acetaminophen (TYLENOL) tablet 650 mg  650 mg Oral Q6H PRN Lavina Hamman, MD       Or  . acetaminophen (TYLENOL) suppository 650 mg  650 mg Rectal Q6H PRN Lavina Hamman, MD       . allopurinol (ZYLOPRIM) tablet 100 mg  100 mg Oral Daily Lavina Hamman, MD   100 mg at 04/07/15 2209  . amiodarone (PACERONE) tablet 200 mg  200 mg Oral Daily Lavina Hamman, MD      . atorvastatin (LIPITOR) tablet 40 mg  40 mg Oral Daily Lavina Hamman, MD      . levothyroxine (SYNTHROID, LEVOTHROID) tablet 25 mcg  25 mcg Oral QAC breakfast Lavina Hamman, MD   25 mcg at 04/08/15 0850  . [START ON 04/09/2015] midodrine (PROAMATINE) tablet 5 mg  5 mg Oral Once per day on Mon Wed Fri Lavina Hamman, MD      . ondansetron [facility]) tablet 4 mg  4 mg Oral Q6H PRN Lavina Hamman, MD       Or  . ondansetron [facility]) injection 4 mg  4 mg Intravenous Q6H PRN Lavina Hamman, MD      . pantoprazole (PROTONIX) injection 40 mg  40 mg Intravenous Q12H Lavina Hamman, MD        Allergies as of 04/07/2015  . (No Known Allergies)    Family History  Problem Relation Age of Onset  . Diabetes Mother     History   Social History  . Marital Status: Married    Spouse Name: N/A  . Number of Children: N/A  . Years of Education: N/A   Occupational History  . retired    Social History Main Topics  . Smoking status: Former Smoker    Types: Cigarettes, Cigars    Quit date: 10/25/1968  . Smokeless tobacco: Never Used     Comment: stopped smoking in 1970  . Alcohol Use: No  . Drug Use: No  . Sexual Activity: Not on file   Other Topics Concern  . Not on file   Social History Narrative    Review of Systems: Positive = bold Gen: Denies any fever, chills, rigors, night sweats, anorexia, fatigue, weakness, malaise, involuntary weight loss, and sleep disorder CV: Denies chest pain, angina, palpitations, syncope, orthopnea, PND, peripheral edema, and claudication. Resp: Denies dyspnea, cough, sputum, wheezing, coughing up blood. GI: Described in detail in HPI.    GU : Denies urinary burning, blood in urine, urinary frequency, urinary hesitancy, nocturnal urination, and urinary incontinence. MS:  Denies joint pain or swelling.  Denies muscle weakness, cramps, atrophy.  Derm: Denies rash, itching, oral ulcerations, hives, unhealing ulcers.  Psych: Denies depression, anxiety, memory loss, suicidal ideation, hallucinations,  and confusion. Heme: Denies bruising, bleeding, and enlarged lymph nodes. Neuro:  Denies any headaches, dizziness, paresthesias. Endo:  Denies any problems with DM, thyroid, adrenal function.  Physical Exam: Vital signs in last 24 hours: Temp:  [97.6 F (36.4 C)-99.1 F (37.3 C)] 98.7 F (37.1 C) (06/14 0918) Pulse Rate:  [58-62] 59 (06/14 0918) Resp:  [12-23] 21 (06/14 0918) BP: (  91-130)/(40-84) 119/61 mmHg (06/14 0918) SpO2:  [90 %-100 %] 98 % (06/14 0918) Weight:  [78.6 kg (173 lb 4.5 oz)-79.425 kg (175 lb 1.6 oz)] 78.9 kg (173 lb 15.1 oz) (06/14 0346)   General:   Alert,  Younger-appearing than stated age, Well-developed, well-nourished, pleasant and cooperative in NAD Head:  Normocephalic and atraumatic. Eyes:  Sclera clear, no icterus.   Conjunctiva slightly pale Ears:  Normal auditory acuity. Nose:  No deformity, discharge,  or lesions. Mouth:  No deformity or lesions.  Oropharynx pale and somewhat dry Neck:  Supple; no masses or thyromegaly. Lungs:  Clear throughout to auscultation.   No wheezes, crackles, or rhonchi. No acute distress. Heart:  AICD; Regular rate and rhythm; IV/VI harsh mid-systolic murmur heard best LUSB; no clicks, rubs,  or gallops. Abdomen:  Soft, nontender and nondistended. No masses, hepatosplenomegaly or hernias noted. Normal bowel sounds, without guarding, and without rebound.     Msk:  Symmetrical without gross deformities. Normal posture. Pulses:  Normal pulses noted. Extremities:  + fistula; Without clubbing or edema. Neurologic:  Alert and  oriented x4;  Diffusely weak, otherwise grossly normal neurologically. Skin:  Intact without significant lesions or rashes. Cervical Nodes:  No significant cervical  adenopathy. Psych:  Alert and cooperative. Normal mood and affect.   Lab Results:  Recent Labs  04/07/15 1615 04/07/15 2135 04/08/15 0105  WBC 7.2 5.1 5.1  HGB 9.1* 7.9* 7.3*  HCT 28.2* 24.8* 22.6*  PLT 132* 106* 99*   BMET  Recent Labs  04/07/15 1615 04/08/15 0105  NA 142 143  K 3.4* 3.7  CL 99* 102  CO2 32 31  GLUCOSE 98 112*  BUN 18 26*  CREATININE 3.26* 3.96*  CALCIUM 9.1 8.7*   LFT  Recent Labs  04/07/15 1615 04/08/15 0105  PROT 6.9  --   ALBUMIN 3.4* 2.7*  AST 34  --   ALT 34  --   ALKPHOS 136*  --   BILITOT 0.7  --    PT/INR  Recent Labs  04/07/15 1705 04/08/15 0105  LABPROT 49.7* 31.9*  INR 5.77* 3.17*    Studies/Results: No results found.  Impression:  1.  Melena. 2.  Acute on chronic anemia; acute blood loss at present. 3.  Coagulopathy, on warfarin. 4.  Multiple medical problems.  Plan:  1.  Keep NPO. 2.  PPI. 3.  FFP being administered at present. 4.  If INR can be < 2 by early afternoon, will do endoscopy today; otherwise will plan to do tomorrow. 5.  Risks (bleeding, infection, bowel perforation that could require surgery, sedation-related changes in cardiopulmonary systems), benefits (identification and possible treatment of source of symptoms, exclusion of certain causes of symptoms), and alternatives (watchful waiting, radiographic imaging studies, empiric medical treatment) of upper endoscopy (EGD) were explained to patient/family in detail and patient wishes to proceed.   LOS: 1 day   Khoi Hamberger M  04/08/2015, 9:29 AM  Pager [phone] If no answer or after 5 PM call [phone]

## 2015-04-09 ENCOUNTER — Inpatient Hospital Stay (HOSPITAL_COMMUNITY): Payer: Medicare Other | Admitting: Certified Registered"

## 2015-04-09 ENCOUNTER — Encounter (HOSPITAL_COMMUNITY): Payer: Self-pay

## 2015-04-09 ENCOUNTER — Encounter (HOSPITAL_COMMUNITY)
Admission: EM | Disposition: A | Discharge status: Home / Self Care | Payer: Self-pay | Source: Home / Self Care | Attending: Internal Medicine

## 2015-04-09 DIAGNOSIS — I480 Paroxysmal atrial fibrillation: Secondary | ICD-10-CM

## 2015-04-09 HISTORY — PX: ESOPHAGOGASTRODUODENOSCOPY: SHX5428

## 2015-04-09 LAB — PROTIME-INR
INR: 1.63 — ABNORMAL HIGH (ref 0.00–1.49)
Prothrombin Time: 19.3 seconds — ABNORMAL HIGH (ref 11.6–15.2)

## 2015-04-09 LAB — CBC
HCT: 27.6 % — ABNORMAL LOW (ref 39.0–52.0)
HEMATOCRIT: 28.3 % — AB (ref 39.0–52.0)
Hemoglobin: 9.3 g/dL — ABNORMAL LOW (ref 13.0–17.0)
Hemoglobin: 9 g/dL — ABNORMAL LOW (ref 13.0–17.0)
MCH: 29.6 pg (ref 26.0–34.0)
MCH: 29.3 pg (ref 26.0–34.0)
MCHC: 32.9 g/dL (ref 30.0–36.0)
MCHC: 32.6 g/dL (ref 30.0–36.0)
MCV: 90.1 fL (ref 78.0–100.0)
MCV: 89.9 fL (ref 78.0–100.0)
PLATELETS: 108 10*3/uL — AB (ref 150–400)
PLATELETS: 94 10*3/uL — AB (ref 150–400)
RBC: 3.14 MIL/uL — ABNORMAL LOW (ref 4.22–5.81)
RBC: 3.07 MIL/uL — AB (ref 4.22–5.81)
RDW: 21.4 % — ABNORMAL HIGH (ref 11.5–15.5)
RDW: 20.8 % — ABNORMAL HIGH (ref 11.5–15.5)
WBC: 7.1 10*3/uL (ref 4.0–10.5)
WBC: 6.1 10*3/uL (ref 4.0–10.5)

## 2015-04-09 LAB — TYPE AND SCREEN
ABO/RH(D): O POS
Antibody Screen: NEGATIVE
Unit division: 0
Unit division: 0

## 2015-04-09 LAB — PREPARE FRESH FROZEN PLASMA
UNIT DIVISION: 0
Unit division: 0

## 2015-04-09 SURGERY — EGD (ESOPHAGOGASTRODUODENOSCOPY)
Anesthesia: Monitor Anesthesia Care | Laterality: Left

## 2015-04-09 SURGERY — EGD (ESOPHAGOGASTRODUODENOSCOPY)
Anesthesia: Moderate Sedation

## 2015-04-09 MED ORDER — DARBEPOETIN ALFA 60 MCG/0.3ML IJ SOSY
PREFILLED_SYRINGE | INTRAMUSCULAR | Status: AC
  Filled 2015-04-09: qty 0.3

## 2015-04-09 MED ORDER — LIDOCAINE HCL (CARDIAC) 20 MG/ML IV SOLN
INTRAVENOUS | Status: DC | PRN
  Administered 2015-04-09: 60 mg via INTRAVENOUS

## 2015-04-09 MED ORDER — PEG 3350-KCL-NA BICARB-NACL 420 G PO SOLR
4000.0000 mL | Freq: Once | ORAL | Status: AC
  Administered 2015-04-10: 4000 mL via ORAL
  Filled 2015-04-09: qty 4000

## 2015-04-09 MED ORDER — SODIUM CHLORIDE 0.9 % IV SOLN
INTRAVENOUS | Status: DC
  Administered 2015-04-09: 500 mL via INTRAVENOUS

## 2015-04-09 MED ORDER — PROPOFOL 10 MG/ML IV BOLUS
INTRAVENOUS | Status: DC | PRN
  Administered 2015-04-09 (×3): 20 mg via INTRAVENOUS
  Administered 2015-04-09: 40 mg via INTRAVENOUS
  Administered 2015-04-09: 20 mg via INTRAVENOUS

## 2015-04-09 MED ORDER — DOXERCALCIFEROL 4 MCG/2ML IV SOLN
INTRAVENOUS | Status: AC
  Administered 2015-04-09: 7 ug via INTRAVENOUS
  Filled 2015-04-09: qty 4

## 2015-04-09 NOTE — Progress Notes (Signed)
[facility] Progress Note   JOSUEL KOEPPEN TDD:[id number] DOB: Apr 29, 1930 DOA: 04/07/2015 PCP: [name], MD  Brief narrative: [name] is a 81 y.o. male with past medical history of atrial fibrillation on Coumadin, end-stage renal disease who presents with black stools in setting of an INR of 5.77   Subjective: He had an episode of black stool this AM and again after the EGD. No abdominal pain, nausea or vomiting.   Assessment/Plan: Principal Problem:   Melena -GI performed an EGD today- no source of bleeding found - Colonoscopy planned for tomorrow -Discontinue twice a day IV Protonix - cont to follow H and H every 12 hrs  Active Problems: Anemia due to acute blood loss -baseline Hb around 12-13- dropped to 9 on admission- given 2 U PRBC for Hb of 7.3 on the night of admission  Acute thrombocytopenia - due to blood loss? follow    Atrial fibrillation -Coumadin reversed-currently in sinus rhythm-continue amiodarone  Coagulopathy - INR reversed with Vit K and FFP    Chronic systolic heart failure -Fluid management per nephrology  End-stage renal disease - dialysis Monday Wednesday Friday-nephrology consulted    Automatic implantable cardioverter-defibrillator in situ  Code Status: Full code Disposition Plan: Colonoscopy tomorrow DVT prophylaxis: SCDs Consultants: GI Procedures: EGD Antibiotics: Anti-infectives    None      Objective: Filed Weights   04/08/15 0346 04/08/15 2130 04/09/15 0341  Weight: 78.9 kg (173 lb 15.1 oz) 82.3 kg (181 lb 7 oz) 82.3 kg (181 lb 7 oz)    Intake/Output Summary (Last 24 hours) at 04/09/15 1527 Last data filed at 04/09/15 1124  Gross per 24 hour  Intake    390 ml  Output      0 ml  Net    390 ml     Vitals Filed Vitals:   04/09/15 1207 04/09/15 1300 04/09/15 1400 04/09/15 1500  BP: 124/59 137/61 150/73 107/65  Pulse: 59 59 119 58  Temp:      TempSrc:      Resp: 18 14 17 18   Height:       Weight:      SpO2: 100% 100% 95% 100%    Exam:  General:  Pt is alert, not in acute distress  HEENT: No icterus, No thrush, oral mucosa moist  Cardiovascular: regular rate and rhythm, S1/S2 No murmur  Respiratory: clear to auscultation bilaterally   Abdomen: Soft, +Bowel sounds, non tender, non distended, no guarding  MSK: No LE edema, cyanosis or clubbing  Data Reviewed: Basic Metabolic Panel:  Recent Labs Lab 04/07/15 1615 04/08/15 0105  NA 142 143  K 3.4* 3.7  CL 99* 102  CO2 32 31  GLUCOSE 98 112*  BUN 18 26*  CREATININE 3.26* 3.96*  CALCIUM 9.1 8.7*  PHOS  --  2.9   Liver Function Tests:  Recent Labs Lab 04/07/15 1615 04/08/15 0105  AST 34  --   ALT 34  --   ALKPHOS 136*  --   BILITOT 0.7  --   PROT 6.9  --   ALBUMIN 3.4* 2.7*   No results for input(s): LIPASE, AMYLASE in the last 168 hours. No results for input(s): AMMONIA in the last 168 hours. CBC:  Recent Labs Lab 04/07/15 1615 04/07/15 2135 04/08/15 0105 04/08/15 1407 04/09/15 0249  WBC 7.2 5.1 5.1 6.9 7.1  NEUTROABS  --  3.1 2.9  --   --   HGB 9.1* 7.9* 7.3* 9.2* 9.3*  HCT 28.2*  24.8* 22.6* 28.3* 28.3*  MCV 91.9 92.5 92.6 90.1 90.1  PLT 132* 106* 99* 107* 108*   Cardiac Enzymes: No results for input(s): CKTOTAL, CKMB, CKMBINDEX, TROPONINI in the last 168 hours. BNP (last 3 results) No results for input(s): BNP in the last 8760 hours.  ProBNP (last 3 results) No results for input(s): PROBNP in the last 8760 hours.  CBG: No results for input(s): GLUCAP in the last 168 hours.  Recent Results (from the past 240 hour(s))  MRSA PCR Screening     Status: None   Collection Time: 04/07/15  7:55 PM  Result Value Ref Range Status   MRSA by PCR NEGATIVE NEGATIVE Final    Comment:        The GeneXpert MRSA Assay (FDA approved for NASAL specimens only), is one component of a comprehensive MRSA colonization surveillance program. It is not intended to diagnose MRSA infection nor to  guide or monitor treatment for MRSA infections.      Studies: No results found.  Scheduled Meds:  Scheduled Meds: . allopurinol  100 mg Oral Daily  . amiodarone  200 mg Oral Daily  . atorvastatin  40 mg Oral Daily  . darbepoetin (ARANESP) injection - DIALYSIS  60 mcg Intravenous Q Wed-HD  . doxercalciferol  7 mcg Intravenous Q M,W,F-HD  . ferric gluconate (FERRLECIT/NULECIT) IV  125 mg Intravenous Q M,W,F-HD  . levothyroxine  25 mcg Oral QAC breakfast  . midodrine  5 mg Oral Once per day on Mon Wed Fri  . multivitamin  1 tablet Oral QHS  . pantoprazole (PROTONIX) IV  40 mg Intravenous Q12H  . [START ON 04/10/2015] polyethylene glycol-electrolytes  4,000 mL Oral Once   Continuous Infusions: . sodium chloride 500 mL (04/09/15 1030)    Time spent on care of this patient: 35 minutes    [place], MD 04/09/2015, 3:27 PM  LOS: 2 days   [facility] Office  [phone] Pager - Text Page per [email or web address] If 7PM-7AM, please contact night-coverage [email or web address]

## 2015-04-09 NOTE — Progress Notes (Signed)
Per MD, patient can receive morning meds prior to EGD today. RN will monitor.

## 2015-04-09 NOTE — H&P (View-Only) (Signed)
[facility] Gastroenterology Consultation Note  Referring Provider:   Dr. Berle Mull [facility]) Primary Care Physician:  [name], MD Primary Gastroenterologist:  None  Reason for Consultation:  melena  HPI: [name] is a 81 y.o. male admitted with melena and anemia.  For the past couple weeks, patient has had 1-2 black stools per day.  He takes aspirin and warfarin for cardiac disease and atrial fibrillation.  Found to have Hgb ~7.5, compared to baseline ~8.5.  INR ~5.5 on admission, ~3.2 today.  He has no abdominal pain, nausea, vomiting, dysphagia, hematochezia, hematemesis, change in bowel habits, loss-of-appetite, unintentional weight loss.  Denies prior history of GI bleeding.  Endorses prior endoscopy and colonoscopy over 10 years ago, doesn't recall the results.   Past Medical History  Diagnosis Date  . Dyslipidemia   . CHF (congestive heart failure), NYHA class II   . Atrial fibrillation   . Prostate cancer   . Depression   . Shortness of breath   . Gout   . Myocardial infarction   . Hypothyroidism   . Chronic kidney disease     . Hemodialysis TTS  . Arthritis   . ICD (implantable cardiac defibrillator) in place   . Coronary artery disease     Sees Dr. Lovena Le, [facility] cardiology  . Hypertension     sees Dr. Katherine Roan    Past Surgical History  Procedure Laterality Date  . Medtronic biv icd  10/2006  . Pci/stent  02/2005  . Total knee arthroplasty    . Av fistula placement  10/13/2012    Procedure: ARTERIOVENOUS (AV) FISTULA CREATION;  Surgeon: Mal Misty, MD;  Location: [place];  Service: Vascular;  Laterality: Right;  Creation right arm arteriovenous fistula   . Insertion of dialysis catheter  10/13/2012    Procedure: INSERTION OF DIALYSIS CATHETER;  Surgeon: Mal Misty, MD;  Location: [place];  Service: Vascular;  Laterality: N/A;  right internal jugular vein  . Cardiac catheterization    . Ligation of competing branches of arteriovenous fistula   11/24/2012    Procedure: LIGATION OF COMPETING BRANCHES OF ARTERIOVENOUS FISTULA;  Surgeon: Rosetta Posner, MD;  Location: [facility] OR;  Service: Vascular;  Laterality: Right;  ligation of competing branch right arteriovenous fistula  . Av fistula placement Left 01/08/2013    Procedure: ARTERIOVENOUS (AV) FISTULA CREATION;  Surgeon: Elam Dutch, MD;  Location: [place];  Service: Vascular;  Laterality: Left;  . Cardioversion N/A 07/21/2012    Procedure: CARDIOVERSION;  Surgeon: Evans Lance, MD;  Location: [facility] CATH LAB;  Service: Cardiovascular;  Laterality: N/A;    Prior to Admission medications   Medication Sig Start Date End Date Taking? Authorizing Provider  allopurinol (ZYLOPRIM) 100 MG tablet Take 100 mg by mouth daily.  06/12/12  Yes Historical Provider, MD  amiodarone (PACERONE) 200 MG tablet Take 200 mg by mouth daily.    Yes Historical Provider, MD  atorvastatin (LIPITOR) 40 MG tablet Take 40 mg by mouth daily.     Yes Historical Provider, MD  Calcium Acetate, Phos Binder, (CALCIUM ACETATE PO) Take 2 capsules by mouth daily.   Yes Historical Provider, MD  levothyroxine (SYNTHROID, LEVOTHROID) 25 MCG tablet Take 1 tablet (25 mcg total) by mouth daily before breakfast. 10/18/12  Yes Charolette Forward, MD  midodrine (PROAMATINE) 5 MG tablet Take 1 tablet three days a week on Monday, Wednesday and Friday. 07/20/13  Yes Historical Provider, MD  Multiple Vitamin (MULTIVITAMIN WITH MINERALS) TABS Take  1 tablet by mouth daily.   Yes Historical Provider, MD  warfarin (COUMADIN) 2 MG tablet Take 2-4 mg by mouth See admin instructions. Pt takes 2mg  on Sundays, pt takes 4mg  all other days of week   Yes Historical Provider, MD    Current Facility-Administered Medications  Medication Dose Route Frequency Provider Last Rate Last Dose  . acetaminophen (TYLENOL) tablet 650 mg  650 mg Oral Q6H PRN Lavina Hamman, MD       Or  . acetaminophen (TYLENOL) suppository 650 mg  650 mg Rectal Q6H PRN Lavina Hamman, MD       . allopurinol (ZYLOPRIM) tablet 100 mg  100 mg Oral Daily Lavina Hamman, MD   100 mg at 04/07/15 2209  . amiodarone (PACERONE) tablet 200 mg  200 mg Oral Daily Lavina Hamman, MD      . atorvastatin (LIPITOR) tablet 40 mg  40 mg Oral Daily Lavina Hamman, MD      . levothyroxine (SYNTHROID, LEVOTHROID) tablet 25 mcg  25 mcg Oral QAC breakfast Lavina Hamman, MD   25 mcg at 04/08/15 0850  . [START ON 04/09/2015] midodrine (PROAMATINE) tablet 5 mg  5 mg Oral Once per day on Mon Wed Fri Lavina Hamman, MD      . ondansetron [facility]) tablet 4 mg  4 mg Oral Q6H PRN Lavina Hamman, MD       Or  . ondansetron [facility]) injection 4 mg  4 mg Intravenous Q6H PRN Lavina Hamman, MD      . pantoprazole (PROTONIX) injection 40 mg  40 mg Intravenous Q12H Lavina Hamman, MD        Allergies as of 04/07/2015  . (No Known Allergies)    Family History  Problem Relation Age of Onset  . Diabetes Mother     History   Social History  . Marital Status: Married    Spouse Name: N/A  . Number of Children: N/A  . Years of Education: N/A   Occupational History  . retired    Social History Main Topics  . Smoking status: Former Smoker    Types: Cigarettes, Cigars    Quit date: 10/25/1968  . Smokeless tobacco: Never Used     Comment: stopped smoking in 1970  . Alcohol Use: No  . Drug Use: No  . Sexual Activity: Not on file   Other Topics Concern  . Not on file   Social History Narrative    Review of Systems: Positive = bold Gen: Denies any fever, chills, rigors, night sweats, anorexia, fatigue, weakness, malaise, involuntary weight loss, and sleep disorder CV: Denies chest pain, angina, palpitations, syncope, orthopnea, PND, peripheral edema, and claudication. Resp: Denies dyspnea, cough, sputum, wheezing, coughing up blood. GI: Described in detail in HPI.    GU : Denies urinary burning, blood in urine, urinary frequency, urinary hesitancy, nocturnal urination, and urinary incontinence. MS:  Denies joint pain or swelling.  Denies muscle weakness, cramps, atrophy.  Derm: Denies rash, itching, oral ulcerations, hives, unhealing ulcers.  Psych: Denies depression, anxiety, memory loss, suicidal ideation, hallucinations,  and confusion. Heme: Denies bruising, bleeding, and enlarged lymph nodes. Neuro:  Denies any headaches, dizziness, paresthesias. Endo:  Denies any problems with DM, thyroid, adrenal function.  Physical Exam: Vital signs in last 24 hours: Temp:  [97.6 F (36.4 C)-99.1 F (37.3 C)] 98.7 F (37.1 C) (06/14 0918) Pulse Rate:  [58-62] 59 (06/14 0918) Resp:  [12-23] 21 (06/14 0918) BP: (  91-130)/(40-84) 119/61 mmHg (06/14 0918) SpO2:  [90 %-100 %] 98 % (06/14 0918) Weight:  [78.6 kg (173 lb 4.5 oz)-79.425 kg (175 lb 1.6 oz)] 78.9 kg (173 lb 15.1 oz) (06/14 0346)   General:   Alert,  Younger-appearing than stated age, Well-developed, well-nourished, pleasant and cooperative in NAD Head:  Normocephalic and atraumatic. Eyes:  Sclera clear, no icterus.   Conjunctiva slightly pale Ears:  Normal auditory acuity. Nose:  No deformity, discharge,  or lesions. Mouth:  No deformity or lesions.  Oropharynx pale and somewhat dry Neck:  Supple; no masses or thyromegaly. Lungs:  Clear throughout to auscultation.   No wheezes, crackles, or rhonchi. No acute distress. Heart:  AICD; Regular rate and rhythm; IV/VI harsh mid-systolic murmur heard best LUSB; no clicks, rubs,  or gallops. Abdomen:  Soft, nontender and nondistended. No masses, hepatosplenomegaly or hernias noted. Normal bowel sounds, without guarding, and without rebound.     Msk:  Symmetrical without gross deformities. Normal posture. Pulses:  Normal pulses noted. Extremities:  + fistula; Without clubbing or edema. Neurologic:  Alert and  oriented x4;  Diffusely weak, otherwise grossly normal neurologically. Skin:  Intact without significant lesions or rashes. Cervical Nodes:  No significant cervical  adenopathy. Psych:  Alert and cooperative. Normal mood and affect.   Lab Results:  Recent Labs  04/07/15 1615 04/07/15 2135 04/08/15 0105  WBC 7.2 5.1 5.1  HGB 9.1* 7.9* 7.3*  HCT 28.2* 24.8* 22.6*  PLT 132* 106* 99*   BMET  Recent Labs  04/07/15 1615 04/08/15 0105  NA 142 143  K 3.4* 3.7  CL 99* 102  CO2 32 31  GLUCOSE 98 112*  BUN 18 26*  CREATININE 3.26* 3.96*  CALCIUM 9.1 8.7*   LFT  Recent Labs  04/07/15 1615 04/08/15 0105  PROT 6.9  --   ALBUMIN 3.4* 2.7*  AST 34  --   ALT 34  --   ALKPHOS 136*  --   BILITOT 0.7  --    PT/INR  Recent Labs  04/07/15 1705 04/08/15 0105  LABPROT 49.7* 31.9*  INR 5.77* 3.17*    Studies/Results: No results found.  Impression:  1.  Melena. 2.  Acute on chronic anemia; acute blood loss at present. 3.  Coagulopathy, on warfarin. 4.  Multiple medical problems.  Plan:  1.  Keep NPO. 2.  PPI. 3.  FFP being administered at present. 4.  If INR can be < 2 by early afternoon, will do endoscopy today; otherwise will plan to do tomorrow. 5.  Risks (bleeding, infection, bowel perforation that could require surgery, sedation-related changes in cardiopulmonary systems), benefits (identification and possible treatment of source of symptoms, exclusion of certain causes of symptoms), and alternatives (watchful waiting, radiographic imaging studies, empiric medical treatment) of upper endoscopy (EGD) were explained to patient/family in detail and patient wishes to proceed.   LOS: 1 day   Renardo Cheatum M  04/08/2015, 9:29 AM  Pager [phone] If no answer or after 5 PM call [phone]

## 2015-04-09 NOTE — Anesthesia Preprocedure Evaluation (Addendum)
Anesthesia Evaluation  Patient identified by MRN, date of birth, ID band Patient awake    Reviewed: Allergy & Precautions, H&P , NPO status , Patient's Chart, lab work & pertinent test results, reviewed documented beta blocker date and time   Airway Mallampati: II  TM Distance: >3 FB Neck ROM: full    Dental  (+) Partial Upper, Dental Advisory Given   Pulmonary shortness of breath and with exertion, former smoker,  breath sounds clear to auscultation        Cardiovascular hypertension, On Medications and On Home Beta Blockers + CAD, + Past MI and +CHF + dysrhythmias Atrial Fibrillation + Cardiac Defibrillator + Valvular Problems/Murmurs MR Rhythm:regular     Neuro/Psych PSYCHIATRIC DISORDERS negative neurological ROS     GI/Hepatic negative GI ROS, Neg liver ROS,   Endo/Other  Hypothyroidism   Renal/GU DialysisRenal disease  negative genitourinary   Musculoskeletal  (+) Arthritis -,   Abdominal   Peds  Hematology negative hematology ROS (+)   Anesthesia Other Findings See surgeon's H&P   Reproductive/Obstetrics negative OB ROS                            Anesthesia Physical  Anesthesia Plan  ASA: III  Anesthesia Plan: MAC   Post-op Pain Management:    Induction: Intravenous  Airway Management Planned: Simple Face Mask  Additional Equipment:   Intra-op Plan:   Post-operative Plan:   Informed Consent: I have reviewed the patients History and Physical, chart, labs and discussed the procedure including the risks, benefits and alternatives for the proposed anesthesia with the patient or authorized representative who has indicated his/her understanding and acceptance.   Dental Advisory Given  Plan Discussed with: CRNA  Anesthesia Plan Comments:         Anesthesia Quick Evaluation

## 2015-04-09 NOTE — Progress Notes (Signed)
Subjective:   Feels well. Reports dark stool this AM  Objective Filed Vitals:   04/09/15 0330 04/09/15 0341 04/09/15 0758 04/09/15 0800  BP: 132/52  129/52 129/52  Pulse: 57  60 59  Temp: 97.7 F (36.5 C)  97.7 F (36.5 C)   TempSrc: Oral  Oral   Resp: 20  20 21   Height:      Weight:  82.3 kg (181 lb 7 oz)    SpO2: 98%  97% 99%   Physical Exam General: alert and oriented. No acute distress. Sitting in chair Heart: RRR Lungs: CTA, unlabored  Abdomen: soft, nontender +BS  Extremities: no edema Dialysis Access:  L AVF +b/t  Dialysis Orders: MWF NW 4 hr 79kgs 2K/2.25Ca 400/800 NO heparin  Profile 4  hectorol 7 venofer 100 x 10 (last dose 6/22) then 50 mg weekly (start 6/29)  Assessment/Plan: 1. GIB/anemia- Heme +stools. GI following- for EGD today. hgb 9.3 S/p FFP and 2 u RBC. Cont protonix. No ESA- hgb had been stable in the 11s, start low dose today. Cont Fe bolus until 6/22 for tsat 25 2. ESRD - MWF [facility]- HD pending today, no heparin in HD 3. Hypertension/volume - 129/52 on midodrine, gains controlled. 4. Metabolic bone disease - Ca+ 8.7 last phos 3.4 and PTH 278, cont hectorol and phoslo when diet advanced 5. Nutrition - alb 2.7 NPO 6. afib- amiodarone. Coumadin on hold. INR down to 1.63 from 5.77  Shelle Iron, NP [place] [phone] 04/09/2015,9:33 AM  LOS: 2 days   Pt seen, examined and agree w A/P as above.  Kelly Splinter MD pager [phone]    cell [phone] 04/09/2015, 4:32 PM    Additional Objective Labs: Basic Metabolic Panel:  Recent Labs Lab 04/07/15 1615 04/08/15 0105  NA 142 143  K 3.4* 3.7  CL 99* 102  CO2 32 31  GLUCOSE 98 112*  BUN 18 26*  CREATININE 3.26* 3.96*  CALCIUM 9.1 8.7*  PHOS  --  2.9   Liver Function Tests:  Recent Labs Lab 04/07/15 1615 04/08/15 0105  AST 34  --   ALT 34  --   ALKPHOS 136*  --   BILITOT 0.7  --   PROT 6.9  --   ALBUMIN 3.4* 2.7*   No results for  input(s): LIPASE, AMYLASE in the last 168 hours. CBC:  Recent Labs Lab 04/07/15 1615 04/07/15 2135 04/08/15 0105 04/08/15 1407 04/09/15 0249  WBC 7.2 5.1 5.1 6.9 7.1  NEUTROABS  --  3.1 2.9  --   --   HGB 9.1* 7.9* 7.3* 9.2* 9.3*  HCT 28.2* 24.8* 22.6* 28.3* 28.3*  MCV 91.9 92.5 92.6 90.1 90.1  PLT 132* 106* 99* 107* 108*   Blood Culture    Component Value Date/Time   SDES WOUND LEG 10/02/2012 1859   SPECREQUEST NONE 10/02/2012 1859   CULT  10/02/2012 1859    FEW STAPHYLOCOCCUS AUREUS Note: RIFAMPIN AND GENTAMICIN SHOULD NOT BE USED AS SINGLE DRUGS FOR TREATMENT OF STAPH INFECTIONS. FEW SERRATIA MARCESCENS   REPTSTATUS 10/05/2012 FINAL 10/02/2012 1859    Cardiac Enzymes: No results for input(s): CKTOTAL, CKMB, CKMBINDEX, TROPONINI in the last 168 hours. CBG: No results for input(s): GLUCAP in the last 168 hours. Iron Studies: No results for input(s): IRON, TIBC, TRANSFERRIN, FERRITIN in the last 72 hours. @lablastinr3 @ Studies/Results: No results found. Medications:   . allopurinol  100 mg Oral Daily  . amiodarone  200 mg Oral Daily  . atorvastatin  40  mg Oral Daily  . darbepoetin (ARANESP) injection - DIALYSIS  60 mcg Intravenous Q Wed-HD  . doxercalciferol  7 mcg Intravenous Q M,W,F-HD  . ferric gluconate (FERRLECIT/NULECIT) IV  125 mg Intravenous Q M,W,F-HD  . levothyroxine  25 mcg Oral QAC breakfast  . midodrine  5 mg Oral Once per day on Mon Wed Fri  . multivitamin  1 tablet Oral QHS  . pantoprazole (PROTONIX) IV  40 mg Intravenous Q12H

## 2015-04-09 NOTE — Op Note (Signed)
[facility] [place], [address]   ENDOSCOPY PROCEDURE REPORT  PATIENT: Jacob, [name]  MR#: [id number] BIRTHDATE: December 23, 1929 , 45  yrs. old GENDER: male ENDOSCOPIST:Tayvin Preslar Oletta Lamas, MD REFERRED BY: Vincente Liberty, M.D.  Roney Jaffe, M.D.  Evans Lance, M.D. PROCEDURE DATE:  04/09/2015 PROCEDURE:   EGD, diagnostic ASA CLASS:    Class III INDICATIONS: G.I.  bleeding in hemodialysis patient that has been on anticoagulation due to cardiac disease with implantable defibrillator history congestive heart failure. MEDICATION: Propofol 120 mg IV TOPICAL ANESTHETIC:   Cetacaine Spray  DESCRIPTION OF PROCEDURE:   After the risks and benefits of the procedure were explained, informed consent was obtained.  The Pentax Gastroscope [id number]  endoscope was introduced through the mouth  and advanced to the second portion of the duodenum .  The instrument was slowly withdrawn as the mucosa was fully examined. Estimated blood loss is zero unless otherwise noted in this procedure report.      ESOPHAGUS: No esophagitis , varices or signs of active bleeding.  STOMACH: The mucosa of the stomach appeared normal.  DUODENUM: The duodenal mucosa showed no abnormalities. Retroflexed views revealed no abnormalities.    The scope was then withdrawn from the patient and the procedure completed.  COMPLICATIONS: There were no immediate complications.  ENDOSCOPIC IMPRESSION: 1.   No esophagitis , varices or signs of active bleeding 2.   The mucosa of the stomach appeared normal 3.   The duodenal mucosa showed no abnormalities 4.   No source of blood loss seen in the upper G.I. tract RECOMMENDATIONS: We will resume liquid diet and proceed with colonoscopy tomorrow.   _______________________________ Lorrin MaisLaurence Spates, MD 04/09/2015 11:55 AM     cc: Vincente Liberty MD Evans Lance, MD  Roney Jaffe MD  CPT CODES: ICD CODES:  The  ICD and CPT codes recommended by this software are interpretations from the data that the clinical staff has captured with the software.  The verification of the translation of this report to the ICD and CPT codes and modifiers is the sole responsibility of the health care institution and practicing physician where this report was generated.  [place]. will not be held responsible for the validity of the ICD and CPT codes included on this report.  AMA assumes no liability for data contained or not contained herein. CPT is a Designer, television/film set of the [employer].  PATIENT NAME:  Jacob, [name] MR#: [id number]

## 2015-04-09 NOTE — Interval H&P Note (Signed)
History and Physical Interval Note:  04/09/2015 10:50 AM  [name]  has presented today for surgery, with the diagnosis of anemia, melena  The various methods of treatment have been discussed with the patient and family. After consideration of risks, benefits and other options for treatment, the patient has consented to  Procedure(s): ESOPHAGOGASTRODUODENOSCOPY (EGD) (Left) as a surgical intervention .  The patient's history has been reviewed, patient examined, no change in status, stable for surgery.  I have reviewed the patient's chart and labs.  Questions were answered to the patient's satisfaction.     Precious Gilchrest JR,Chaise Mahabir L

## 2015-04-09 NOTE — Transfer of Care (Signed)
Immediate Anesthesia Transfer of Care Note  Patient: [name]  Procedure(s) Performed: Procedure(s): ESOPHAGOGASTRODUODENOSCOPY (EGD) (Left)  Patient Location: Endoscopy Unit  Anesthesia Type:MAC  Level of Consciousness: lethargic and responds to stimulation  Airway & Oxygen Therapy: Patient Spontanous Breathing  Post-op Assessment: Report given to RN  Post vital signs: Reviewed and stable  Last Vitals:  Filed Vitals:   04/09/15 1021  BP: 145/66  Pulse: 60  Temp: 36.4 C  Resp: 19    Complications: No apparent anesthesia complications

## 2015-04-10 ENCOUNTER — Inpatient Hospital Stay (HOSPITAL_COMMUNITY): Payer: Medicare Other | Admitting: Anesthesiology

## 2015-04-10 ENCOUNTER — Encounter (HOSPITAL_COMMUNITY)
Admission: EM | Disposition: A | Discharge status: Home / Self Care | Payer: Self-pay | Source: Home / Self Care | Attending: Internal Medicine

## 2015-04-10 ENCOUNTER — Encounter (HOSPITAL_COMMUNITY): Payer: Self-pay | Admitting: Gastroenterology

## 2015-04-10 HISTORY — PX: COLONOSCOPY: SHX5424

## 2015-04-10 LAB — CBC
HCT: 26 % — ABNORMAL LOW (ref 39.0–52.0)
HEMATOCRIT: 27.9 % — AB (ref 39.0–52.0)
HEMOGLOBIN: 9.1 g/dL — AB (ref 13.0–17.0)
HEMOGLOBIN: 8.5 g/dL — AB (ref 13.0–17.0)
MCH: 29.6 pg (ref 26.0–34.0)
MCH: 30.8 pg (ref 26.0–34.0)
MCHC: 32.6 g/dL (ref 30.0–36.0)
MCHC: 32.7 g/dL (ref 30.0–36.0)
MCV: 90.9 fL (ref 78.0–100.0)
MCV: 94.2 fL (ref 78.0–100.0)
PLATELETS: 98 10*3/uL — AB (ref 150–400)
Platelets: 108 10*3/uL — ABNORMAL LOW (ref 150–400)
RBC: 3.07 MIL/uL — ABNORMAL LOW (ref 4.22–5.81)
RBC: 2.76 MIL/uL — ABNORMAL LOW (ref 4.22–5.81)
RDW: 20.7 % — AB (ref 11.5–15.5)
RDW: 21 % — AB (ref 11.5–15.5)
WBC: 7.7 10*3/uL (ref 4.0–10.5)
WBC: 7.2 10*3/uL (ref 4.0–10.5)

## 2015-04-10 LAB — RENAL FUNCTION PANEL
ANION GAP: 15 (ref 5–15)
Albumin: 2.9 g/dL — ABNORMAL LOW (ref 3.5–5.0)
BUN: 18 mg/dL (ref 6–20)
CO2: 24 mmol/L (ref 22–32)
Calcium: 8.5 mg/dL — ABNORMAL LOW (ref 8.9–10.3)
Chloride: 99 mmol/L — ABNORMAL LOW (ref 101–111)
Creatinine, Ser: 3.37 mg/dL — ABNORMAL HIGH (ref 0.61–1.24)
GFR, EST AFRICAN AMERICAN: 18 mL/min — AB (ref >60)
GFR, EST NON AFRICAN AMERICAN: 16 mL/min — AB (ref >60)
GLUCOSE: 72 mg/dL (ref 65–99)
POTASSIUM: 4.4 mmol/L (ref 3.5–5.1)
Phosphorus: 2.3 mg/dL — ABNORMAL LOW (ref 2.5–4.6)
SODIUM: 138 mmol/L (ref 135–145)

## 2015-04-10 SURGERY — COLONOSCOPY
Anesthesia: Monitor Anesthesia Care

## 2015-04-10 SURGERY — COLONOSCOPY
Anesthesia: Moderate Sedation

## 2015-04-10 MED ORDER — LIDOCAINE HCL (PF) 1 % IJ SOLN: 5.0000 mL | INTRAMUSCULAR | Status: DC | PRN

## 2015-04-10 MED ORDER — LIDOCAINE HCL (CARDIAC) 20 MG/ML IV SOLN
INTRAVENOUS | Status: DC | PRN
  Administered 2015-04-10: 50 mg via INTRAVENOUS

## 2015-04-10 MED ORDER — LIDOCAINE-PRILOCAINE 2.5-2.5 % EX CREA: 1.0000 "application " | TOPICAL_CREAM | CUTANEOUS | Status: DC | PRN

## 2015-04-10 MED ORDER — HEPARIN SODIUM (PORCINE) 1000 UNIT/ML DIALYSIS
1000.0000 [IU] | INTRAMUSCULAR | Status: DC | PRN
  Filled 2015-04-10: qty 1

## 2015-04-10 MED ORDER — NEPRO/CARBSTEADY PO LIQD: 237.0000 mL | ORAL | Status: DC | PRN

## 2015-04-10 MED ORDER — SODIUM CHLORIDE 0.9 % IV SOLN: INTRAVENOUS | Status: DC

## 2015-04-10 MED ORDER — LIDOCAINE-PRILOCAINE 2.5-2.5 % EX CREA
1.0000 "application " | TOPICAL_CREAM | CUTANEOUS | Status: DC | PRN
  Filled 2015-04-10: qty 5

## 2015-04-10 MED ORDER — PROPOFOL 10 MG/ML IV BOLUS
INTRAVENOUS | Status: DC | PRN
  Administered 2015-04-10 (×6): 20 mg via INTRAVENOUS
  Administered 2015-04-10: 30 mg via INTRAVENOUS
  Administered 2015-04-10 (×2): 20 mg via INTRAVENOUS

## 2015-04-10 MED ORDER — PENTAFLUOROPROP-TETRAFLUOROETH EX AERO: 1.0000 "application " | INHALATION_SPRAY | CUTANEOUS | Status: DC | PRN

## 2015-04-10 MED ORDER — ALTEPLASE 2 MG IJ SOLR
2.0000 mg | Freq: Once | INTRAMUSCULAR | Status: AC | PRN
  Filled 2015-04-10: qty 2

## 2015-04-10 MED ORDER — SODIUM CHLORIDE 0.9 % IV SOLN: 100.0000 mL | INTRAVENOUS | Status: DC | PRN

## 2015-04-10 MED ORDER — LACTATED RINGERS IV SOLN
INTRAVENOUS | Status: DC | PRN
  Administered 2015-04-10: 14:00:00 via INTRAVENOUS

## 2015-04-10 MED ORDER — CHLORHEXIDINE GLUCONATE 0.12 % MT SOLN
15.0000 mL | Freq: Two times a day (BID) | OROMUCOSAL | Status: DC
  Administered 2015-04-10 – 2015-04-11 (×2): 15 mL via OROMUCOSAL
  Filled 2015-04-10 (×4): qty 15

## 2015-04-10 MED ORDER — CETYLPYRIDINIUM CHLORIDE 0.05 % MT LIQD: 7.0000 mL | Freq: Two times a day (BID) | OROMUCOSAL | Status: DC

## 2015-04-10 MED ORDER — POLYETHYLENE GLYCOL 3350 17 G PO PACK
17.0000 g | PACK | Freq: Two times a day (BID) | ORAL | Status: DC
  Administered 2015-04-10: 17 g via ORAL
  Filled 2015-04-10 (×3): qty 1

## 2015-04-10 MED ORDER — HEPARIN SODIUM (PORCINE) 1000 UNIT/ML DIALYSIS: 1000.0000 [IU] | INTRAMUSCULAR | Status: DC | PRN

## 2015-04-10 MED ORDER — ALTEPLASE 2 MG IJ SOLR: 2.0000 mg | Freq: Once | INTRAMUSCULAR | Status: DC | PRN

## 2015-04-10 NOTE — Anesthesia Preprocedure Evaluation (Signed)
Anesthesia Evaluation  Patient identified by MRN, date of birth, ID band Patient awake    Reviewed: Allergy & Precautions, NPO status , Patient's Chart, lab work & pertinent test results  Airway Mallampati: II   Neck ROM: full    Dental   Pulmonary shortness of breath, former smoker,  breath sounds clear to auscultation        Cardiovascular hypertension, + CAD, + Past MI and +CHF + dysrhythmias Atrial Fibrillation + Cardiac Defibrillator Rhythm:irregular Rate:Normal     Neuro/Psych    GI/Hepatic   Endo/Other  Hypothyroidism   Renal/GU Renal InsufficiencyRenal disease     Musculoskeletal  (+) Arthritis -,   Abdominal   Peds  Hematology  (+) anemia ,   Anesthesia Other Findings   Reproductive/Obstetrics                             Anesthesia Physical Anesthesia Plan  ASA: IV  Anesthesia Plan: MAC   Post-op Pain Management:    Induction: Intravenous  Airway Management Planned: Simple Face Mask  Additional Equipment:   Intra-op Plan:   Post-operative Plan:   Informed Consent: I have reviewed the patients History and Physical, chart, labs and discussed the procedure including the risks, benefits and alternatives for the proposed anesthesia with the patient or authorized representative who has indicated his/her understanding and acceptance.     Plan Discussed with: CRNA, Anesthesiologist and Surgeon  Anesthesia Plan Comments:         Anesthesia Quick Evaluation

## 2015-04-10 NOTE — Progress Notes (Addendum)
[facility] Progress Note   [name] QBV:[id number] DOB: Jun 26, 1930 DOA: 04/07/2015 PCP: [name], MD  Brief narrative: [name] is a 81 y.o. male with past medical history of atrial fibrillation on Coumadin, end-stage renal disease who presents with black stools in setting of an INR of 5.77   Subjective: Noted to have maroon stool today.   Assessment/Plan: Principal Problem:   Melena - GI performed an EGD 6/15- no source of bleeding found- continued to have blood stools with bowel prep - Colonoscopy today reveals coffee grounds (likely old blood) -again no source of bleeding found  - cont to follow H and H every 12 hrs-   Active Problems: Anemia due to acute blood loss -baseline Hb around 12-13- dropped to 9 on admission- given 2 U PRBC for Hb of 7.3 on the night of admission- Hb stable since  Acute thrombocytopenia - due to blood loss? follow    Atrial fibrillation -Coumadin reversed-currently in sinus rhythm-continue amiodarone  Coagulopathy - INR reversed with Vit K and FFP    Chronic systolic heart failure -Fluid management per nephrology  End-stage renal disease - dialysis Monday Wednesday Friday-nephrology consulted    Automatic implantable cardioverter-defibrillator in situ  Code Status: Full code Disposition Plan: Colonoscopy tomorrow DVT prophylaxis: SCDs Consultants: GI Procedures: EGD Antibiotics: Anti-infectives    None      Objective: Filed Weights   04/09/15 0341 04/09/15 1509 04/10/15 0350  Weight: 82.3 kg (181 lb 7 oz) 79.9 kg (176 lb 2.4 oz) 79.8 kg (175 lb 14.8 oz)    Intake/Output Summary (Last 24 hours) at 04/10/15 1651 Last data filed at 04/10/15 1530  Gross per 24 hour  Intake   1155 ml  Output   1000 ml  Net    155 ml     Vitals Filed Vitals:   04/10/15 1535 04/10/15 1540 04/10/15 1545 04/10/15 1550  BP: 108/45 118/43  118/56  Pulse: 57 102 58 60  Temp:      TempSrc:      Resp: 17 22 17  12   Height:      Weight:      SpO2: 100% 97% 92% 91%    Exam:  General:  Pt is alert, not in acute distress  HEENT: No icterus, No thrush, oral mucosa moist  Cardiovascular: regular rate and rhythm, S1/S2 No murmur  Respiratory: clear to auscultation bilaterally   Abdomen: Soft, +Bowel sounds, non tender, non distended, no guarding  MSK: No LE edema, cyanosis or clubbing  Data Reviewed: Basic Metabolic Panel:  Recent Labs Lab 04/07/15 1615 04/08/15 0105 04/10/15 0359  NA 142 143 138  K 3.4* 3.7 4.4  CL 99* 102 99*  CO2 32 31 24  GLUCOSE 98 112* 72  BUN 18 26* 18  CREATININE 3.26* 3.96* 3.37*  CALCIUM 9.1 8.7* 8.5*  PHOS  --  2.9 2.3*   Liver Function Tests:  Recent Labs Lab 04/07/15 1615 04/08/15 0105 04/10/15 0359  AST 34  --   --   ALT 34  --   --   ALKPHOS 136*  --   --   BILITOT 0.7  --   --   PROT 6.9  --   --   ALBUMIN 3.4* 2.7* 2.9*   No results for input(s): LIPASE, AMYLASE in the last 168 hours. No results for input(s): AMMONIA in the last 168 hours. CBC:  Recent Labs Lab 04/07/15 2135 04/08/15 0105 04/08/15 1407 04/09/15 0249 04/09/15 1651 04/10/15  0400  WBC 5.1 5.1 6.9 7.1 6.1 7.7  NEUTROABS 3.1 2.9  --   --   --   --   HGB 7.9* 7.3* 9.2* 9.3* 9.0* 9.1*  HCT 24.8* 22.6* 28.3* 28.3* 27.6* 27.9*  MCV 92.5 92.6 90.1 90.1 89.9 90.9  PLT 106* 99* 107* 108* 94* 108*   Cardiac Enzymes: No results for input(s): CKTOTAL, CKMB, CKMBINDEX, TROPONINI in the last 168 hours. BNP (last 3 results) No results for input(s): BNP in the last 8760 hours.  ProBNP (last 3 results) No results for input(s): PROBNP in the last 8760 hours.  CBG: No results for input(s): GLUCAP in the last 168 hours.  Recent Results (from the past 240 hour(s))  MRSA PCR Screening     Status: None   Collection Time: 04/07/15  7:55 PM  Result Value Ref Range Status   MRSA by PCR NEGATIVE NEGATIVE Final    Comment:        The GeneXpert MRSA Assay (FDA approved for  NASAL specimens only), is one component of a comprehensive MRSA colonization surveillance program. It is not intended to diagnose MRSA infection nor to guide or monitor treatment for MRSA infections.      Studies: No results found.  Scheduled Meds:  Scheduled Meds: . allopurinol  100 mg Oral Daily  . amiodarone  200 mg Oral Daily  . atorvastatin  40 mg Oral Daily  . darbepoetin (ARANESP) injection - DIALYSIS  60 mcg Intravenous Q Wed-HD  . doxercalciferol  7 mcg Intravenous Q M,W,F-HD  . ferric gluconate (FERRLECIT/NULECIT) IV  125 mg Intravenous Q M,W,F-HD  . levothyroxine  25 mcg Oral QAC breakfast  . midodrine  5 mg Oral Once per day on Mon Wed Fri  . multivitamin  1 tablet Oral QHS  . polyethylene glycol  17 g Oral BID   Continuous Infusions:    Time spent on care of this patient: 35 minutes    [place], MD 04/10/2015, 4:51 PM  LOS: 3 days   [facility] Office  [phone] Pager - Text Page per [email or web address] If 7PM-7AM, please contact night-coverage [email or web address]

## 2015-04-10 NOTE — Transfer of Care (Signed)
Immediate Anesthesia Transfer of Care Note  Patient: [name]  Procedure(s) Performed: Procedure(s): COLONOSCOPY (N/A)  Patient Location: PACU and Endoscopy Unit  Anesthesia Type:MAC  Level of Consciousness: awake, alert , oriented and patient cooperative  Airway & Oxygen Therapy: Patient Spontanous Breathing and Patient connected to nasal cannula oxygen  Post-op Assessment: Report given to RN, Post -op Vital signs reviewed and stable and Patient moving all extremities X 4  Post vital signs: Reviewed and stable  Last Vitals:  Filed Vitals:   04/10/15 1535  BP: 108/45  Pulse: 57  Temp:   Resp: 17    Complications: No apparent anesthesia complications

## 2015-04-10 NOTE — Interval H&P Note (Signed)
History and Physical Interval Note:  04/10/2015 2:29 PM  [name]  has presented today for surgery, with the diagnosis of anemia  The various methods of treatment have been discussed with the patient and family. After consideration of risks, benefits and other options for treatment, the patient has consented to  Procedure(s): COLONOSCOPY (N/A) as a surgical intervention .  The patient's history has been reviewed, patient examined, no change in status, stable for surgery.  I have reviewed the patient's chart and labs.  Questions were answered to the patient's satisfaction.     Jahkeem Kurka JR,Calissa Swenor L

## 2015-04-10 NOTE — Progress Notes (Signed)
Subjective:   Feels well. Reports normal brown stool now  Objective Filed Vitals:   04/10/15 0350 04/10/15 0816 04/10/15 1239 04/10/15 1340  BP: 124/54 132/55 140/57 138/49  Pulse: 59 100 60 60  Temp: 98.7 F (37.1 C) 97.6 F (36.4 C) 97.6 F (36.4 C) 97.2 F (36.2 C)  TempSrc: Oral Oral Oral Oral  Resp: 19 34 16 24  Height: 5\' 11"  (1.803 m)     Weight: 79.8 kg (175 lb 14.8 oz)     SpO2: 93%  100% 100%   Physical Exam General: alert and oriented. No acute distress. Sitting in chair Heart: RRR Lungs: CTA, unlabored  Abdomen: soft, nontender +BS  Extremities: no edema Dialysis Access:  L AVF +b/t  Dialysis Orders: MWF NW 4 hr 79kgs 2K/2.25Ca 400/800 NO heparin  Profile 4  hectorol 7 venofer 100 x 10 (last dose 6/22) then 50 mg weekly (start 6/29)  Assessment/Plan: 1. GIB/anemia-EGD negative , for colon today. Started esa low dose here 2. ESRD no hep HD for now 3. Hypertension/volume - 129/52 on midodrine, gains controlled. 4. Metabolic bone disease - Ca+ 8.7 last phos 3.4 and PTH 278, cont hectorol and phoslo when diet advanced 5. Nutrition - alb 2.7 NPO 6. afib- amiodarone. Coumadin on hold. INR down to 1.63 from 5.77   Kelly Splinter MD pager [phone]    cell [phone] 04/10/2015, 1:54 PM    Additional Objective Labs: Basic Metabolic Panel:  Recent Labs Lab 04/07/15 1615 04/08/15 0105 04/10/15 0359  NA 142 143 138  K 3.4* 3.7 4.4  CL 99* 102 99*  CO2 32 31 24  GLUCOSE 98 112* 72  BUN 18 26* 18  CREATININE 3.26* 3.96* 3.37*  CALCIUM 9.1 8.7* 8.5*  PHOS  --  2.9 2.3*   Liver Function Tests:  Recent Labs Lab 04/07/15 1615 04/08/15 0105 04/10/15 0359  AST 34  --   --   ALT 34  --   --   ALKPHOS 136*  --   --   BILITOT 0.7  --   --   PROT 6.9  --   --   ALBUMIN 3.4* 2.7* 2.9*   No results for input(s): LIPASE, AMYLASE in the last 168 hours. CBC:  Recent Labs Lab 04/07/15 2135 04/08/15 0105 04/08/15 1407  04/09/15 0249 04/09/15 1651 04/10/15 0400  WBC 5.1 5.1 6.9 7.1 6.1 7.7  NEUTROABS 3.1 2.9  --   --   --   --   HGB 7.9* 7.3* 9.2* 9.3* 9.0* 9.1*  HCT 24.8* 22.6* 28.3* 28.3* 27.6* 27.9*  MCV 92.5 92.6 90.1 90.1 89.9 90.9  PLT 106* 99* 107* 108* 94* 108*   Blood Culture    Component Value Date/Time   SDES WOUND LEG 10/02/2012 1859   SPECREQUEST NONE 10/02/2012 1859   CULT  10/02/2012 1859    FEW STAPHYLOCOCCUS AUREUS Note: RIFAMPIN AND GENTAMICIN SHOULD NOT BE USED AS SINGLE DRUGS FOR TREATMENT OF STAPH INFECTIONS. FEW SERRATIA MARCESCENS   REPTSTATUS 10/05/2012 FINAL 10/02/2012 1859    Cardiac Enzymes: No results for input(s): CKTOTAL, CKMB, CKMBINDEX, TROPONINI in the last 168 hours. CBG: No results for input(s): GLUCAP in the last 168 hours. Iron Studies: No results for input(s): IRON, TIBC, TRANSFERRIN, FERRITIN in the last 72 hours. @lablastinr3 @ Studies/Results: No results found. Medications: . sodium chloride     . allopurinol  100 mg Oral Daily  . amiodarone  200 mg Oral Daily  . atorvastatin  40 mg Oral Daily  .  darbepoetin (ARANESP) injection - DIALYSIS  60 mcg Intravenous Q Wed-HD  . doxercalciferol  7 mcg Intravenous Q M,W,F-HD  . ferric gluconate (FERRLECIT/NULECIT) IV  125 mg Intravenous Q M,W,F-HD  . levothyroxine  25 mcg Oral QAC breakfast  . midodrine  5 mg Oral Once per day on Mon Wed Fri  . multivitamin  1 tablet Oral QHS

## 2015-04-10 NOTE — Anesthesia Postprocedure Evaluation (Signed)
Anesthesia Post Note  Patient: [name]  Procedure(s) Performed: Procedure(s) (LRB): COLONOSCOPY (N/A)  Anesthesia type: MAC  Patient location: PACU  Post pain: Pain level controlled and Adequate analgesia  Post assessment: Post-op Vital signs reviewed, Patient's Cardiovascular Status Stable and Respiratory Function Stable  Last Vitals:  Filed Vitals:   04/10/15 1550  BP: 118/56  Pulse: 60  Temp:   Resp: 12    Post vital signs: Reviewed and stable  Level of consciousness: awake, alert  and oriented  Complications: No apparent anesthesia complications

## 2015-04-10 NOTE — Progress Notes (Signed)
Assisted patient to bedside commode.  Ambulates with minimal assist.  Maroon colored, foul-smelling stool noted.  Large in amount.  Golytely 4L completed. Pt denies of pain or discomfort.

## 2015-04-10 NOTE — Op Note (Signed)
[facility] [place], [id number]   COLONOSCOPY PROCEDURE REPORT  PATIENT: Jacob, [name]  MR#: [id number] BIRTHDATE: [birth year] , 36  yrs. old GENDER: male ENDOSCOPIST: Laurence Spates, MD REFERRED BY:  Vincente Liberty, M.D.  Roney Jaffe, M.D. PROCEDURE DATE:  04-26-2015 PROCEDURE:   Colonoscopy, diagnostic ASA CLASS:   Class III INDICATIONS:G.I.  bleeding and hemodialysis patient who has atrial fibrillation and was anticoagulated negative upper endoscopy. MEDICATIONS: Propofol 200 mg IV  DESCRIPTION OF PROCEDURE:   After the risks and benefits and of the procedure were explained, informed consent was obtained.  revealed no abnormalities of the rectum.    The Pentax Ped Colon [id number] endoscope was introduced through the anus and advanced to the cecum, which was identified by both the appendix and ileocecal valve .  The quality of the prep was fair. .  The instrument was then slowly withdrawn as the colon was fully examined. Estimated blood loss is zero unless otherwise noted in this procedure report.     COLON FINDINGS: No source of bleeding seen throughout the entire colon.  There was old coffee ground material throughout: with some food material.  This was washed and irrigated vigorously without a clear bleeding source seen.     Retroflexed views revealed no abnormalities.     The scope was then withdrawn from the patient and the procedure completed.  WITHDRAWAL TIME:  COMPLICATIONS: There were no immediate complications. ENDOSCOPIC IMPRESSION: No source of bleeding seen throughout the entire colon.  There was old coffee ground material throughout: with some food material. This was washed and irrigated vigorously without a clear bleeding source seen RECOMMENDATIONS: Will follow the patient clinically and keep on Miralax.  If he continues to bleed he may well need G.I.  bleeding scan.  REPEAT EXAM:  [id number], Iona Beard  MD  Roney Jaffe MD  _______________________________ eSigned:  Laurence Spates, MD 04/26/2015 3:32 PM   CPT CODES: ICD CODES:  The ICD and CPT codes recommended by this software are interpretations from the data that the clinical staff has captured with the software.  The verification of the translation of this report to the ICD and CPT codes and modifiers is the sole responsibility of the health care institution and practicing physician where this report was generated.  [employer] will not be held responsible for the validity of the ICD and CPT codes included on this report.  AMA assumes no liability for data contained or not contained herein. CPT is a Designer, television/film set of the [employer].

## 2015-04-11 ENCOUNTER — Encounter (HOSPITAL_COMMUNITY): Payer: Self-pay | Admitting: Gastroenterology

## 2015-04-11 DIAGNOSIS — R791 Abnormal coagulation profile: Secondary | ICD-10-CM

## 2015-04-11 LAB — CBC
HCT: 28.2 % — ABNORMAL LOW (ref 39.0–52.0)
HCT: 28.9 % — ABNORMAL LOW (ref 39.0–52.0)
Hemoglobin: 9 g/dL — ABNORMAL LOW (ref 13.0–17.0)
Hemoglobin: 9.3 g/dL — ABNORMAL LOW (ref 13.0–17.0)
MCH: 29.9 pg (ref 26.0–34.0)
MCH: 30.2 pg (ref 26.0–34.0)
MCHC: 31.9 g/dL (ref 30.0–36.0)
MCHC: 32.2 g/dL (ref 30.0–36.0)
MCV: 93.7 fL (ref 78.0–100.0)
MCV: 93.8 fL (ref 78.0–100.0)
PLATELETS: 106 10*3/uL — AB (ref 150–400)
Platelets: 114 10*3/uL — ABNORMAL LOW (ref 150–400)
RBC: 3.01 MIL/uL — ABNORMAL LOW (ref 4.22–5.81)
RBC: 3.08 MIL/uL — AB (ref 4.22–5.81)
RDW: 21 % — AB (ref 11.5–15.5)
RDW: 20.9 % — ABNORMAL HIGH (ref 11.5–15.5)
WBC: 7.3 10*3/uL (ref 4.0–10.5)
WBC: 8 10*3/uL (ref 4.0–10.5)

## 2015-04-11 LAB — PROTIME-INR
INR: 1.52 — ABNORMAL HIGH (ref 0.00–1.49)
PROTHROMBIN TIME: 18.4 s — AB (ref 11.6–15.2)

## 2015-04-11 MED ORDER — MIDODRINE HCL 5 MG PO TABS
ORAL_TABLET | ORAL | Status: AC
  Administered 2015-04-11: 5 mg via ORAL
  Filled 2015-04-11: qty 1

## 2015-04-11 MED ORDER — DOXERCALCIFEROL 4 MCG/2ML IV SOLN
INTRAVENOUS | Status: AC
  Administered 2015-04-11: 7 ug via INTRAVENOUS
  Filled 2015-04-11: qty 4

## 2015-04-11 NOTE — Progress Notes (Signed)
[facility] PROGRESS NOTE Subjective patient has not had any further bleeding since his colonoscopy. He is not had a bowel movement. Tolerating diet  Objective: Vital signs in last 24 hours: Temp:  [97.5 F (36.4 C)-98.4 F (36.9 C)] 97.5 F (36.4 C) (06/17 1148) Pulse Rate:  [58-62] 60 (06/17 1148) Resp:  [10-22] 15 (06/17 1148) BP: (110-133)/(53-68) 124/55 mmHg (06/17 1148) SpO2:  [84 %-100 %] 99 % (06/17 1148) Weight:  [78.2 kg (172 lb 6.4 oz)-81 kg (178 lb 9.2 oz)] 78.2 kg (172 lb 6.4 oz) (06/17 0735) Last BM Date: 04/10/15  Intake/Output from previous day: 06/16 0701 - 06/17 0700 In: 435 [P.O.:360; I.V.:75] Out: -  Intake/Output this shift: Total I/O In: -  Out: 1000 [Other:1000]    Lab Results:  Recent Labs  04/09/15 1651 04/10/15 0400 04/10/15 1622 04/11/15 0435 04/11/15 1535  WBC 6.1 7.7 7.2 7.3 8.0  HGB 9.0* 9.1* 8.5* 9.0* 9.3*  HCT 27.6* 27.9* 26.0* 28.2* 28.9*  PLT 94* 108* 98* 114* 106*   BMET  Recent Labs  04/10/15 0359  NA 138  K 4.4  CL 99*  CO2 24  CREATININE 3.37*   LFT No results for input(s): PROT, AST, ALT, ALKPHOS, BILITOT, BILIDIR, IBILI in the last 72 hours. PT/INR  Recent Labs  04/09/15 0249 04/11/15 0435  LABPROT 19.3* 18.4*  INR 1.63* 1.52*   PANCREAS No results for input(s): LIPASE in the last 72 hours.       Studies/Results: No results found.  Medications: I have reviewed the patient's current medications.  Assessment/Plan: 1. G.I. bleeding. Probably due to over anticoagulation. Appears to have resolved no significant findings. Would go ahead and resume Coumadin at the regular dose and have INR followed regularly. We'll see again PRN.   Phillips Goulette JR,Rosanne Wohlfarth L 04/11/2015, 4:37 PM  Pager: [phone] If no answer or after hours call [phone]

## 2015-04-11 NOTE — Progress Notes (Signed)
Ambulated patient in the hallway with a front wheel walker.  No difficulty walking with a front wheel walker.

## 2015-04-11 NOTE — Procedures (Signed)
I was present at this dialysis session, have reviewed the session itself and made  appropriate changes  No new issues overnight , stable on HD. Hb 9.0 last checked.   Kelly Splinter MD (pgr) [phone]    [phone] 04/11/2015, 8:51 AM

## 2015-04-11 NOTE — Progress Notes (Signed)
Spoke with Case Manager, [employer], Therapist, sports.  All she needed is an order and the walker will be delivered.  MD stated that she already placed the order.

## 2015-04-11 NOTE — Discharge Summary (Signed)
Physician Discharge Summary  [name]:[id number] DOB: May 24, 1930 DOA: 04/07/2015  PCP: [name], MD  Admit date: 04/07/2015 Discharge date: 04/11/2015  Time spent: 50 minutes  Recommendations for Outpatient Follow-up:  1. Follow-up CBC in 1 week 2. Check INR in 4 days  Discharge Condition: stable  Diet recommendation: renal diet  Discharge Diagnoses:  Principal Problem:   Melena Active Problems:   Supratherapeutic INR   Atrial fibrillation   Chronic systolic heart failure   Automatic implantable cardioverter-defibrillator in situ   End stage renal disease   History of present illness:  [name] is a 81 y.o. male with past medical history of atrial fibrillation on Coumadin, end-stage renal disease who presents with black stools in setting of an INR of 5.77  Hospital Course:  Principal Problem:  Melena -Evaluated by Dr. Oletta Lamas from [facility] - EGD 6/15- no source of bleeding found-decided to do a colonoscopy- continued to have maroon blood stools with bowel prep - Colonoscopy  revealed coffee grounds (likely old blood) -again no source of bleeding found  - Hemoglobin has been stable since transfusion-no further bleeding in past 24 hours -Per GI okay to discharge home -He is advised to call GI or his PCP or come to the ER if he is to have further bleeding  Active Problems: Anemia due to acute blood loss -baseline Hb around 8-9 range- hemoglobin 7.3 on admission-given 2 U PRBC for Hb on the night of admission- Hb stable since at greater than 9  Coagulopathy - INR 5.77 on admission-reversed with Vit K and FFP -INR 1.5-2 day -Recommended by Dr. Oletta Lamas to resume Coumadin and allow INR to drift up steadily  Acute thrombocytopenia - due to blood loss? Follow-repeat CBC in 1 week   Atrial fibrillation -Coumadin reversed as mentioned above -currently in sinus rhythm-continue amiodarone   Chronic systolic heart failure -Fluid management  per nephrology  End-stage renal disease - dialysis Monday Wednesday Friday-nephrology consulted- received dialysis today   Automatic implantable cardioverter-defibrillator in situ   Procedures:  EGD and Colonoscopy  Consultations:  GI  Discharge Exam: Filed Weights   04/10/15 0350 04/11/15 0232 04/11/15 0735  Weight: 79.8 kg (175 lb 14.8 oz) 81 kg (178 lb 9.2 oz) 78.2 kg (172 lb 6.4 oz)   Filed Vitals:   04/11/15 1148  BP: 124/55  Pulse: 60  Temp: 97.5 F (36.4 C)  Resp: 15    General: AAO x 3, no distress Cardiovascular: RRR, no murmurs  Respiratory: clear to auscultation bilaterally GI: soft, non-tender, non-distended, bowel sound positive  Discharge Instructions You were cared for by a hospitalist during your hospital stay. If you have any questions about your discharge medications or the care you received while you were in the hospital after you are discharged, you can call the unit and asked to speak with the hospitalist on call if the hospitalist that took care of you is not available. Once you are discharged, your primary care physician will handle any further medical issues. Please note that NO REFILLS for any discharge medications will be authorized once you are discharged, as it is imperative that you return to your primary care physician (or establish a relationship with a primary care physician if you do not have one) for your aftercare needs so that they can reassess your need for medications and monitor your lab values.      Discharge Instructions    Call MD for:    Complete by:  As directed  For any blood in stool or if you vomit blood     Discharge instructions    Complete by:  As directed   Renal diet     Increase activity slowly    Complete by:  As directed             Medication List    TAKE these medications        allopurinol 100 MG tablet  Commonly known as:  ZYLOPRIM  Take 100 mg by mouth daily.     amiodarone 200 MG tablet   Commonly known as:  PACERONE  Take 200 mg by mouth daily.     atorvastatin 40 MG tablet  Commonly known as:  LIPITOR  Take 40 mg by mouth daily.     CALCIUM ACETATE PO  Take 2 capsules by mouth daily.     levothyroxine 25 MCG tablet  Commonly known as:  SYNTHROID, LEVOTHROID  Take 1 tablet (25 mcg total) by mouth daily before breakfast.     midodrine 5 MG tablet  Commonly known as:  PROAMATINE  Take 1 tablet three days a week on Monday, Wednesday and Friday.     multivitamin with minerals Tabs tablet  Take 1 tablet by mouth daily.     warfarin 2 MG tablet  Commonly known as:  COUMADIN  Take 2-4 mg by mouth See admin instructions. Pt takes 2mg  on Sundays, pt takes 4mg  all other days of week       No Known Allergies Follow-up Information    Follow up with [facility]AR[facility] Dahlia Client, MD.   Specialty:  Gastroenterology   Why:  As needed for further blood in stool or bloody vomitus   Contact information:   [address] [place] [address] [phone]        The results of significant diagnostics from this hospitalization (including imaging, microbiology, ancillary and laboratory) are listed below for reference.    Significant Diagnostic Studies: No results found.  Microbiology: Recent Results (from the past 240 hour(s))  MRSA PCR Screening     Status: None   Collection Time: 04/07/15  7:55 PM  Result Value Ref Range Status   MRSA by PCR NEGATIVE NEGATIVE Final    Comment:        The GeneXpert MRSA Assay (FDA approved for NASAL specimens only), is one component of a comprehensive MRSA colonization surveillance program. It is not intended to diagnose MRSA infection nor to guide or monitor treatment for MRSA infections.      Labs: Basic Metabolic Panel:  Recent Labs Lab 04/07/15 1615 04/08/15 0105 04/10/15 0359  NA 142 143 138  K 3.4* 3.7 4.4  CL 99* 102 99*  CO2 32 31 24  GLUCOSE 98 112* 72  BUN 18 26* 18  CREATININE 3.26* 3.96*  3.37*  CALCIUM 9.1 8.7* 8.5*  PHOS  --  2.9 2.3*   Liver Function Tests:  Recent Labs Lab 04/07/15 1615 04/08/15 0105 04/10/15 0359  AST 34  --   --   ALT 34  --   --   ALKPHOS 136*  --   --   BILITOT 0.7  --   --   PROT 6.9  --   --   ALBUMIN 3.4* 2.7* 2.9*   No results for input(s): LIPASE, AMYLASE in the last 168 hours. No results for input(s): AMMONIA in the last 168 hours. CBC:  Recent Labs Lab 04/07/15 2135 04/08/15 0105  04/09/15 0249 04/09/15 1651 04/10/15  0400 04/10/15 1622 04/11/15 0435  WBC 5.1 5.1  < > 7.1 6.1 7.7 7.2 7.3  NEUTROABS 3.1 2.9  --   --   --   --   --   --   HGB 7.9* 7.3*  < > 9.3* 9.0* 9.1* 8.5* 9.0*  HCT 24.8* 22.6*  < > 28.3* 27.6* 27.9* 26.0* 28.2*  MCV 92.5 92.6  < > 90.1 89.9 90.9 94.2 93.7  PLT 106* 99*  < > 108* 94* 108* 98* 114*  < > = values in this interval not displayed. Cardiac Enzymes: No results for input(s): CKTOTAL, CKMB, CKMBINDEX, TROPONINI in the last 168 hours. BNP: BNP (last 3 results) No results for input(s): BNP in the last 8760 hours.  ProBNP (last 3 results) No results for input(s): PROBNP in the last 8760 hours.  CBG: No results for input(s): GLUCAP in the last 168 hours.     SignedDebbe Odea, MD [facility] 04/11/2015, 3:22 PM

## 2015-04-11 NOTE — Progress Notes (Signed)
Utilization Review Completed.  

## 2015-04-11 NOTE — Anesthesia Postprocedure Evaluation (Signed)
Anesthesia Post Note  Patient: [name]  Procedure(s) Performed: Procedure(s) (LRB): ESOPHAGOGASTRODUODENOSCOPY (EGD) (Left)  Anesthesia type: MAC  Patient location: PACU  Post pain: Pain level controlled  Post assessment: Post-op Vital signs reviewed  Last Vitals: BP 124/55 mmHg  Pulse 60  Temp(Src) 36.4 C (Oral)  Resp 15  Ht 5\' 11"  (1.803 m)  Wt 172 lb 6.4 oz (78.2 kg)  BMI 24.06 kg/m2  SpO2 99%  Post vital signs: Reviewed  Level of consciousness: awake  Complications: No apparent anesthesia complications

## 2015-07-09 ENCOUNTER — Inpatient Hospital Stay (HOSPITAL_COMMUNITY)
Admission: AD | Admit: 2015-07-09 | Discharge: 2015-07-11 | DRG: 308 | Disposition: A | Discharge status: Home Health | Payer: Medicare Other | Source: Ambulatory Visit | Attending: Cardiology | Admitting: Cardiology

## 2015-07-09 DIAGNOSIS — I132 Hypertensive heart and chronic kidney disease with heart failure and with stage 5 chronic kidney disease, or end stage renal disease: Secondary | ICD-10-CM | POA: Diagnosis present

## 2015-07-09 DIAGNOSIS — I482 Chronic atrial fibrillation: Secondary | ICD-10-CM | POA: Diagnosis not present

## 2015-07-09 DIAGNOSIS — Z955 Presence of coronary angioplasty implant and graft: Secondary | ICD-10-CM

## 2015-07-09 DIAGNOSIS — M109 Gout, unspecified: Secondary | ICD-10-CM | POA: Diagnosis present

## 2015-07-09 DIAGNOSIS — N186 End stage renal disease: Secondary | ICD-10-CM | POA: Diagnosis present

## 2015-07-09 DIAGNOSIS — D696 Thrombocytopenia, unspecified: Secondary | ICD-10-CM | POA: Diagnosis not present

## 2015-07-09 DIAGNOSIS — I471 Supraventricular tachycardia: Principal | ICD-10-CM | POA: Diagnosis present

## 2015-07-09 DIAGNOSIS — E785 Hyperlipidemia, unspecified: Secondary | ICD-10-CM | POA: Diagnosis not present

## 2015-07-09 DIAGNOSIS — R748 Abnormal levels of other serum enzymes: Secondary | ICD-10-CM | POA: Diagnosis present

## 2015-07-09 DIAGNOSIS — Z8546 Personal history of malignant neoplasm of prostate: Secondary | ICD-10-CM | POA: Diagnosis not present

## 2015-07-09 DIAGNOSIS — Z87891 Personal history of nicotine dependence: Secondary | ICD-10-CM

## 2015-07-09 DIAGNOSIS — M1990 Unspecified osteoarthritis, unspecified site: Secondary | ICD-10-CM | POA: Diagnosis not present

## 2015-07-09 DIAGNOSIS — I5022 Chronic systolic (congestive) heart failure: Secondary | ICD-10-CM | POA: Diagnosis present

## 2015-07-09 DIAGNOSIS — Z95810 Presence of automatic (implantable) cardiac defibrillator: Secondary | ICD-10-CM

## 2015-07-09 DIAGNOSIS — R000 Tachycardia, unspecified: Secondary | ICD-10-CM

## 2015-07-09 DIAGNOSIS — I481 Persistent atrial fibrillation: Secondary | ICD-10-CM | POA: Diagnosis present

## 2015-07-09 DIAGNOSIS — Z992 Dependence on renal dialysis: Secondary | ICD-10-CM

## 2015-07-09 DIAGNOSIS — Z79899 Other long term (current) drug therapy: Secondary | ICD-10-CM

## 2015-07-09 DIAGNOSIS — I4719 Other supraventricular tachycardia: Secondary | ICD-10-CM | POA: Insufficient documentation

## 2015-07-09 DIAGNOSIS — Z7901 Long term (current) use of anticoagulants: Secondary | ICD-10-CM

## 2015-07-09 DIAGNOSIS — I255 Ischemic cardiomyopathy: Secondary | ICD-10-CM | POA: Insufficient documentation

## 2015-07-09 DIAGNOSIS — I252 Old myocardial infarction: Secondary | ICD-10-CM

## 2015-07-09 DIAGNOSIS — F329 Major depressive disorder, single episode, unspecified: Secondary | ICD-10-CM | POA: Diagnosis not present

## 2015-07-09 DIAGNOSIS — Z96659 Presence of unspecified artificial knee joint: Secondary | ICD-10-CM | POA: Diagnosis not present

## 2015-07-09 DIAGNOSIS — D649 Anemia, unspecified: Secondary | ICD-10-CM | POA: Diagnosis present

## 2015-07-09 DIAGNOSIS — I248 Other forms of acute ischemic heart disease: Secondary | ICD-10-CM | POA: Diagnosis present

## 2015-07-09 DIAGNOSIS — E039 Hypothyroidism, unspecified: Secondary | ICD-10-CM | POA: Diagnosis not present

## 2015-07-09 DIAGNOSIS — Z23 Encounter for immunization: Secondary | ICD-10-CM

## 2015-07-09 DIAGNOSIS — I272 Other secondary pulmonary hypertension: Secondary | ICD-10-CM | POA: Diagnosis present

## 2015-07-09 DIAGNOSIS — I2510 Atherosclerotic heart disease of native coronary artery without angina pectoris: Secondary | ICD-10-CM | POA: Diagnosis present

## 2015-07-09 DIAGNOSIS — I083 Combined rheumatic disorders of mitral, aortic and tricuspid valves: Secondary | ICD-10-CM | POA: Diagnosis present

## 2015-07-09 DIAGNOSIS — I472 Ventricular tachycardia: Secondary | ICD-10-CM

## 2015-07-09 HISTORY — DX: Ischemic cardiomyopathy: I25.5

## 2015-07-09 HISTORY — DX: Personal history of other diseases of the musculoskeletal system and connective tissue: Z87.39

## 2015-07-09 HISTORY — DX: Dependence on renal dialysis: Z99.2

## 2015-07-09 HISTORY — DX: Personal history of other medical treatment: Z92.89

## 2015-07-09 HISTORY — DX: Presence of automatic (implantable) cardiac defibrillator: Z95.810

## 2015-07-09 HISTORY — DX: End stage renal disease: N18.6

## 2015-07-09 LAB — PROTIME-INR
INR: 3.15 — ABNORMAL HIGH (ref 0.00–1.49)
PROTHROMBIN TIME: 31.8 s — AB (ref 11.6–15.2)

## 2015-07-09 LAB — CBC WITH DIFFERENTIAL/PLATELET
BASOS ABS: 0 10*3/uL (ref 0.0–0.1)
Basophils Relative: 0 %
EOS ABS: 0.1 10*3/uL (ref 0.0–0.7)
Eosinophils Relative: 3 %
HCT: 36.5 % — ABNORMAL LOW (ref 39.0–52.0)
Hemoglobin: 11.9 g/dL — ABNORMAL LOW (ref 13.0–17.0)
Lymphocytes Relative: 18 %
Lymphs Abs: 0.9 10*3/uL (ref 0.7–4.0)
MCH: 28.9 pg (ref 26.0–34.0)
MCHC: 32.6 g/dL (ref 30.0–36.0)
MCV: 88.6 fL (ref 78.0–100.0)
MONO ABS: 0.9 10*3/uL (ref 0.1–1.0)
Monocytes Relative: 20 %
Neutro Abs: 2.7 10*3/uL (ref 1.7–7.7)
Neutrophils Relative %: 58 %
PLATELETS: 120 10*3/uL — AB (ref 150–400)
RBC: 4.12 MIL/uL — AB (ref 4.22–5.81)
RDW: 17.2 % — AB (ref 11.5–15.5)
WBC: 4.7 10*3/uL (ref 4.0–10.5)

## 2015-07-09 LAB — TSH: TSH: 0.053 u[IU]/mL — AB (ref 0.350–4.500)

## 2015-07-09 LAB — TROPONIN I: TROPONIN I: 0.57 ng/mL — AB (ref <0.031)

## 2015-07-09 LAB — COMPREHENSIVE METABOLIC PANEL
ALK PHOS: 200 U/L — AB (ref 38–126)
ALT: 37 U/L (ref 17–63)
AST: 44 U/L — ABNORMAL HIGH (ref 15–41)
Albumin: 3.1 g/dL — ABNORMAL LOW (ref 3.5–5.0)
Anion gap: 10 (ref 5–15)
BILIRUBIN TOTAL: 1 mg/dL (ref 0.3–1.2)
BUN: 20 mg/dL (ref 6–20)
CALCIUM: 7.9 mg/dL — AB (ref 8.9–10.3)
CO2: 35 mmol/L — ABNORMAL HIGH (ref 22–32)
CREATININE: 3.96 mg/dL — AB (ref 0.61–1.24)
Chloride: 95 mmol/L — ABNORMAL LOW (ref 101–111)
GFR, EST AFRICAN AMERICAN: 15 mL/min — AB (ref >60)
GFR, EST NON AFRICAN AMERICAN: 13 mL/min — AB (ref >60)
Glucose, Bld: 137 mg/dL — ABNORMAL HIGH (ref 65–99)
Potassium: 3.4 mmol/L — ABNORMAL LOW (ref 3.5–5.1)
Sodium: 140 mmol/L (ref 135–145)
TOTAL PROTEIN: 6.7 g/dL (ref 6.5–8.1)

## 2015-07-09 LAB — MAGNESIUM: Magnesium: 2.2 mg/dL (ref 1.7–2.4)

## 2015-07-09 MED ORDER — ONDANSETRON HCL 4 MG/2ML IJ SOLN: 4.0000 mg | Freq: Four times a day (QID) | INTRAMUSCULAR | Status: DC | PRN

## 2015-07-09 MED ORDER — CALCIUM ACETATE (PHOS BINDER) 667 MG PO CAPS
667.0000 mg | ORAL_CAPSULE | Freq: Three times a day (TID) | ORAL | Status: DC
  Administered 2015-07-10 – 2015-07-11 (×4): 667 mg via ORAL
  Filled 2015-07-09 (×4): qty 1

## 2015-07-09 MED ORDER — ATORVASTATIN CALCIUM 40 MG PO TABS
40.0000 mg | ORAL_TABLET | Freq: Every day | ORAL | Status: DC
  Administered 2015-07-09 – 2015-07-11 (×3): 40 mg via ORAL
  Filled 2015-07-09 (×3): qty 1

## 2015-07-09 MED ORDER — ADULT MULTIVITAMIN W/MINERALS CH
1.0000 | ORAL_TABLET | Freq: Every day | ORAL | Status: DC
  Administered 2015-07-09 – 2015-07-11 (×3): 1 via ORAL
  Filled 2015-07-09 (×3): qty 1

## 2015-07-09 MED ORDER — ASPIRIN EC 81 MG PO TBEC
81.0000 mg | DELAYED_RELEASE_TABLET | Freq: Every day | ORAL | Status: DC
  Administered 2015-07-10 – 2015-07-11 (×2): 81 mg via ORAL
  Filled 2015-07-09 (×2): qty 1

## 2015-07-09 MED ORDER — ALLOPURINOL 100 MG PO TABS
100.0000 mg | ORAL_TABLET | Freq: Every day | ORAL | Status: DC
  Administered 2015-07-10 – 2015-07-11 (×2): 100 mg via ORAL
  Filled 2015-07-09 (×2): qty 1

## 2015-07-09 MED ORDER — ASPIRIN 325 MG PO TABS
ORAL_TABLET | ORAL | Status: AC
  Filled 2015-07-09: qty 1

## 2015-07-09 MED ORDER — AMIODARONE LOAD VIA INFUSION
150.0000 mg | Freq: Once | INTRAVENOUS | Status: AC
  Administered 2015-07-09: 150 mg via INTRAVENOUS
  Filled 2015-07-09: qty 83.34

## 2015-07-09 MED ORDER — AMIODARONE HCL IN DEXTROSE 360-4.14 MG/200ML-% IV SOLN
30.0000 mg/h | INTRAVENOUS | Status: DC
  Administered 2015-07-10 – 2015-07-11 (×3): 30 mg/h via INTRAVENOUS
  Filled 2015-07-09 (×8): qty 200

## 2015-07-09 MED ORDER — DIGOXIN 0.25 MG/ML IJ SOLN
0.2500 mg | Freq: Once | INTRAMUSCULAR | Status: AC
  Administered 2015-07-09: 0.25 mg via INTRAVENOUS
  Filled 2015-07-09: qty 2

## 2015-07-09 MED ORDER — NITROGLYCERIN 0.4 MG SL SUBL: 0.4000 mg | SUBLINGUAL_TABLET | SUBLINGUAL | Status: DC | PRN

## 2015-07-09 MED ORDER — SODIUM CHLORIDE 0.9 % IV SOLN
INTRAVENOUS | Status: DC
  Administered 2015-07-09: 19:00:00 via INTRAVENOUS

## 2015-07-09 MED ORDER — ASPIRIN 300 MG RE SUPP
300.0000 mg | RECTAL | Status: AC
  Filled 2015-07-09: qty 1

## 2015-07-09 MED ORDER — ASPIRIN 81 MG PO CHEW
324.0000 mg | CHEWABLE_TABLET | ORAL | Status: AC
  Administered 2015-07-09: 324 mg via ORAL
  Filled 2015-07-09: qty 4

## 2015-07-09 MED ORDER — AMIODARONE HCL IN DEXTROSE 360-4.14 MG/200ML-% IV SOLN
60.0000 mg/h | INTRAVENOUS | Status: AC
  Administered 2015-07-09: 60 mg/h via INTRAVENOUS
  Filled 2015-07-09 (×2): qty 200

## 2015-07-09 MED ORDER — ACETAMINOPHEN 325 MG PO TABS: 650.0000 mg | ORAL_TABLET | ORAL | Status: DC | PRN

## 2015-07-09 MED ORDER — ASPIRIN 81 MG PO CHEW
CHEWABLE_TABLET | ORAL | Status: AC
  Administered 2015-07-09: 324 mg
  Filled 2015-07-09: qty 4

## 2015-07-09 NOTE — Progress Notes (Signed)
  Amiodarone Drug - Drug Interaction Consult Note  Recommendations: Monitor INR closely on coumadin as amiodarone can potentiate the effect of coumadin. Also if digoxin to continue after load, check level at steady state as amiodarone can increase risk for digoxin toxcitiy.   Amiodarone is metabolized by the cytochrome P450 system and therefore has the potential to cause many drug interactions. Amiodarone has an average plasma half-life of 50 days (range 20 to 100 days).   There is potential for drug interactions to occur several weeks or months after stopping treatment and the onset of drug interactions may be slow after initiating amiodarone.   []  Statins: Increased risk of myopathy. Simvastatin- restrict dose to 20mg  daily. Other statins: counsel patients to report any muscle pain or weakness immediately.  [x]  Anticoagulants: Amiodarone can increase anticoagulant effect. Consider warfarin dose reduction. Patients should be monitored closely and the dose of anticoagulant altered accordingly, remembering that amiodarone levels take several weeks to stabilize.  []  Antiepileptics: Amiodarone can increase plasma concentration of phenytoin, the dose should be reduced. Note that small changes in phenytoin dose can result in large changes in levels. Monitor patient and counsel on signs of toxicity.  []  Beta blockers: increased risk of bradycardia, AV block and myocardial depression. Sotalol - avoid concomitant use.  []   Calcium channel blockers (diltiazem and verapamil): increased risk of bradycardia, AV block and myocardial depression.  []   Cyclosporine: Amiodarone increases levels of cyclosporine. Reduced dose of cyclosporine is recommended.  [x]  Digoxin dose should be halved when amiodarone is started.  []  Diuretics: increased risk of cardiotoxicity if hypokalemia occurs.  []  Oral hypoglycemic agents (glyburide, glipizide, glimepiride): increased risk of hypoglycemia. Patient's glucose levels  should be monitored closely when initiating amiodarone therapy.   []  Drugs that prolong the QT interval:  Torsades de pointes risk may be increased with concurrent use - avoid if possible.  Monitor QTc, also keep magnesium/potassium WNL if concurrent therapy can't be avoided. Marland Kitchen Antibiotics: e.g. fluoroquinolones, erythromycin. . Antiarrhythmics: e.g. quinidine, procainamide, disopyramide, sotalol. . Antipsychotics: e.g. phenothiazines, haloperidol.  . Lithium, tricyclic antidepressants, and methadone.  Thank You,  Deboraha Sprang  07/09/2015 6:28 PM

## 2015-07-09 NOTE — Progress Notes (Signed)
CRITICAL VALUE ALERT  Critical value received:  Troponin 0.57  Date of notification:  07/09/2015  Time of notification:  1943  Critical value read back:Yes.    Nurse who received alert:  Kendrick Ranch  MD notified (1st page):  Dr. Terrence Dupont  Time of first page:  1943  MD notified (2nd page):  Time of second page:  Responding MD:  Dr. Terrence Dupont  Time MD responded:  [phone]

## 2015-07-09 NOTE — H&P (Signed)
[name] is an 81 y.o. male.   Chief Complaint:Generalized weakness TFT:[name] is a 81 year old male with past medical history significant for multiple medical problems, i.e., coronary artery disease, history of anterolateral wall myocardial infarction in the past, status post PCI to LAD, ischemic dilated cardiomyopathy status post bi-V AICD, chronic atrial fibrillation, history of congestive heart failure secondary to depressed LV systolic function, hypertension, hyperlipidemia, hypothyroidism, end-stage renal disease on hemodialysis, status post probable recent upper GI bleed in the setting of Coumadin toxicity, came to our office from dialysis center.  Asked patient was noted to be hypotensive with blood pressure in 42H and 06C systolic with heart rate in the 120s.  Patient denies any chest pain, nausea, vomiting, diaphoresis.  Denies any palpitation, lightheadedness or syncope.  States overall feels weak.  Denies PND, orthopnea or leg swelling.  EKG done in the office showed wide-complex tachycardia with paced rhythm.  Patient states he has stopped his amiodarone and carvedilol due to hypotension.denies any ICD shocks.  Past Medical History  Diagnosis Date  . Dyslipidemia   . CHF (congestive heart failure), NYHA class II   . Atrial fibrillation   . Prostate cancer   . Depression   . Shortness of breath   . Gout   . Myocardial infarction   . Hypothyroidism   . Chronic kidney disease     . Hemodialysis TTS  . Arthritis   . ICD (implantable cardiac defibrillator) in place   . Coronary artery disease     Sees Dr. Lovena Le, [facility] cardiology  . Hypertension     sees Dr. Katherine Roan    Past Surgical History  Procedure Laterality Date  . Medtronic biv icd  10/2006  . Pci/stent  02/2005  . Total knee arthroplasty    . Av fistula placement  10/13/2012    Procedure: ARTERIOVENOUS (AV) FISTULA CREATION;  Surgeon: Mal Misty, MD;  Location: [place];  Service: Vascular;  Laterality:  Right;  Creation right arm arteriovenous fistula   . Insertion of dialysis catheter  10/13/2012    Procedure: INSERTION OF DIALYSIS CATHETER;  Surgeon: Mal Misty, MD;  Location: [facility];  Service: Vascular;  Laterality: N/A;  right internal jugular vein  . Cardiac catheterization    . Ligation of competing branches of arteriovenous fistula  11/24/2012    Procedure: LIGATION OF COMPETING BRANCHES OF ARTERIOVENOUS FISTULA;  Surgeon: Rosetta Posner, MD;  Location: [facility] OR;  Service: Vascular;  Laterality: Right;  ligation of competing branch right arteriovenous fistula  . Av fistula placement Left 01/08/2013    Procedure: ARTERIOVENOUS (AV) FISTULA CREATION;  Surgeon: Elam Dutch, MD;  Location: [place];  Service: Vascular;  Laterality: Left;  . Cardioversion N/A 07/21/2012    Procedure: CARDIOVERSION;  Surgeon: Evans Lance, MD;  Location: [facility] CATH LAB;  Service: Cardiovascular;  Laterality: N/A;  . Esophagogastroduodenoscopy Left 04/09/2015    Procedure: ESOPHAGOGASTRODUODENOSCOPY (EGD);  Surgeon: Laurence Spates, MD;  Location: [facility] ENDOSCOPY;  Service: Endoscopy;  Laterality: Left;  . Colonoscopy N/A 04/10/2015    Procedure: COLONOSCOPY;  Surgeon: Laurence Spates, MD;  Location: [place];  Service: Endoscopy;  Laterality: N/A;    Family History  Problem Relation Age of Onset  . Diabetes Mother    Social History:  reports that he quit smoking about 46 years ago. His smoking use included Cigarettes and Cigars. He has never used smokeless tobacco. He reports that he does not drink alcohol or use illicit drugs.  Allergies: No Known  Allergies  Medications Prior to Admission  Medication Sig Dispense Refill  . allopurinol (ZYLOPRIM) 100 MG tablet Take 100 mg by mouth daily.     Marland Kitchen amiodarone (PACERONE) 200 MG tablet Take 200 mg by mouth daily.     Marland Kitchen atorvastatin (LIPITOR) 40 MG tablet Take 40 mg by mouth daily.      . Calcium Acetate, Phos Binder, (CALCIUM ACETATE PO) Take 2 capsules by mouth  daily.    Marland Kitchen levothyroxine (SYNTHROID, LEVOTHROID) 25 MCG tablet Take 1 tablet (25 mcg total) by mouth daily before breakfast. 30 tablet 3  . midodrine (PROAMATINE) 5 MG tablet Take 1 tablet three days a week on Monday, Wednesday and Friday.    . Multiple Vitamin (MULTIVITAMIN WITH MINERALS) TABS Take 1 tablet by mouth daily.    Marland Kitchen warfarin (COUMADIN) 2 MG tablet Take 2-4 mg by mouth See admin instructions. Pt takes 2mg  on Sundays, pt takes 4mg  all other days of week      No results found for this or any previous visit (from the past 48 hour(s)). No results found.  Review of Systems  Constitutional: Positive for malaise/fatigue. Negative for fever and chills.  Eyes: Negative for double vision and photophobia.  Respiratory: Negative for cough, hemoptysis and sputum production.   Cardiovascular: Negative for chest pain, palpitations, orthopnea and claudication.  Gastrointestinal: Negative for nausea, vomiting and abdominal pain.  Genitourinary: Negative for dysuria.  Neurological: Positive for dizziness and weakness. Negative for headaches.    There were no vitals taken for this visit. Physical Exam  Constitutional: He is oriented to person, place, and time.  HENT:  Head: Atraumatic.  Eyes: Conjunctivae are normal. Left eye exhibits no discharge. No scleral icterus.  Neck: Normal range of motion. No tracheal deviation present.  Cardiovascular:  Tachycardic, S1, S2, soft  Respiratory: Effort normal and breath sounds normal. No respiratory distress. He has no wheezes.  GI: Soft. Bowel sounds are normal. He exhibits no distension. There is no tenderness.  Musculoskeletal: He exhibits no edema or tenderness.  Neurological: He is alert and oriented to person, place, and time.     Assessment/Plan Wide-complex tachycardia, possibly pacemaker mediated tachycardia. Chronic atrial fibrillation. Coronary artery disease, history of anterolateral wall MI in the past. Ischemic cardiomyopathy,  status post bi-V ICD in the past. Compensated systolic congestive heart failure. Hypertension Hyperlipidemia Hypothyroidism. End-stage renal disease on hemodialysis. History of probable upper GI bleed and recent past secondary to Coumadin toxicity. Plan As per orders  Charolette Forward 07/09/2015, 5:31 PM

## 2015-07-10 ENCOUNTER — Encounter (HOSPITAL_COMMUNITY): Payer: Self-pay | Admitting: Nurse Practitioner

## 2015-07-10 DIAGNOSIS — I471 Supraventricular tachycardia: Principal | ICD-10-CM

## 2015-07-10 DIAGNOSIS — I481 Persistent atrial fibrillation: Secondary | ICD-10-CM

## 2015-07-10 DIAGNOSIS — I255 Ischemic cardiomyopathy: Secondary | ICD-10-CM | POA: Insufficient documentation

## 2015-07-10 DIAGNOSIS — I4719 Other supraventricular tachycardia: Secondary | ICD-10-CM | POA: Insufficient documentation

## 2015-07-10 LAB — CBC
HCT: 39 % (ref 39.0–52.0)
HCT: 43 % (ref 39.0–52.0)
Hemoglobin: 12.6 g/dL — ABNORMAL LOW (ref 13.0–17.0)
Hemoglobin: 13.7 g/dL (ref 13.0–17.0)
MCH: 28.8 pg (ref 26.0–34.0)
MCH: 28.7 pg (ref 26.0–34.0)
MCHC: 32.3 g/dL (ref 30.0–36.0)
MCHC: 31.9 g/dL (ref 30.0–36.0)
MCV: 89 fL (ref 78.0–100.0)
MCV: 90.1 fL (ref 78.0–100.0)
Platelets: 122 10*3/uL — ABNORMAL LOW (ref 150–400)
Platelets: 110 10*3/uL — ABNORMAL LOW (ref 150–400)
RBC: 4.38 MIL/uL (ref 4.22–5.81)
RBC: 4.77 MIL/uL (ref 4.22–5.81)
RDW: 17.3 % — AB (ref 11.5–15.5)
RDW: 17.3 % — ABNORMAL HIGH (ref 11.5–15.5)
WBC: 5.5 10*3/uL (ref 4.0–10.5)
WBC: 5.6 10*3/uL (ref 4.0–10.5)

## 2015-07-10 LAB — BASIC METABOLIC PANEL
Anion gap: 13 (ref 5–15)
BUN: 30 mg/dL — AB (ref 6–20)
CALCIUM: 8.1 mg/dL — AB (ref 8.9–10.3)
CO2: 31 mmol/L (ref 22–32)
CREATININE: 4.77 mg/dL — AB (ref 0.61–1.24)
Chloride: 97 mmol/L — ABNORMAL LOW (ref 101–111)
GFR calc Af Amer: 12 mL/min — ABNORMAL LOW (ref >60)
GFR, EST NON AFRICAN AMERICAN: 10 mL/min — AB (ref >60)
GLUCOSE: 104 mg/dL — AB (ref 65–99)
Potassium: 3.5 mmol/L (ref 3.5–5.1)
Sodium: 141 mmol/L (ref 135–145)

## 2015-07-10 LAB — RENAL FUNCTION PANEL
Albumin: 3.5 g/dL (ref 3.5–5.0)
Anion gap: 16 — ABNORMAL HIGH (ref 5–15)
BUN: 32 mg/dL — ABNORMAL HIGH (ref 6–20)
CO2: 28 mmol/L (ref 22–32)
Calcium: 8.4 mg/dL — ABNORMAL LOW (ref 8.9–10.3)
Chloride: 97 mmol/L — ABNORMAL LOW (ref 101–111)
Creatinine, Ser: 5.18 mg/dL — ABNORMAL HIGH (ref 0.61–1.24)
GFR calc Af Amer: 11 mL/min — ABNORMAL LOW (ref >60)
GFR calc non Af Amer: 9 mL/min — ABNORMAL LOW (ref >60)
Glucose, Bld: 93 mg/dL (ref 65–99)
Phosphorus: 2.9 mg/dL (ref 2.5–4.6)
Potassium: 3.8 mmol/L (ref 3.5–5.1)
Sodium: 141 mmol/L (ref 135–145)

## 2015-07-10 LAB — LIPID PANEL
Cholesterol: 101 mg/dL (ref 0–200)
HDL: 43 mg/dL (ref >40)
LDL CALC: 46 mg/dL (ref 0–99)
Total CHOL/HDL Ratio: 2.3 RATIO
Triglycerides: 58 mg/dL (ref <150)
VLDL: 12 mg/dL (ref 0–40)

## 2015-07-10 LAB — PROTIME-INR
INR: 3.13 — AB (ref 0.00–1.49)
PROTHROMBIN TIME: 31.6 s — AB (ref 11.6–15.2)

## 2015-07-10 LAB — TROPONIN I
TROPONIN I: 0.52 ng/mL — AB (ref <0.031)
TROPONIN I: 0.57 ng/mL — AB (ref <0.031)

## 2015-07-10 MED ORDER — CARVEDILOL 3.125 MG PO TABS
3.1250 mg | ORAL_TABLET | Freq: Two times a day (BID) | ORAL | Status: DC
  Administered 2015-07-11 (×2): 3.125 mg via ORAL
  Filled 2015-07-10 (×2): qty 1

## 2015-07-10 MED ORDER — PENTAFLUOROPROP-TETRAFLUOROETH EX AERO: 1.0000 "application " | INHALATION_SPRAY | CUTANEOUS | Status: DC | PRN

## 2015-07-10 MED ORDER — HEPARIN SODIUM (PORCINE) 1000 UNIT/ML DIALYSIS
1000.0000 [IU] | INTRAMUSCULAR | Status: DC | PRN
  Administered 2015-07-11: 1000 [IU] via INTRAVENOUS_CENTRAL
  Filled 2015-07-10: qty 1

## 2015-07-10 MED ORDER — LIDOCAINE HCL (PF) 1 % IJ SOLN: 5.0000 mL | INTRAMUSCULAR | Status: DC | PRN

## 2015-07-10 MED ORDER — DOXERCALCIFEROL 4 MCG/2ML IV SOLN
5.0000 ug | INTRAVENOUS | Status: DC
  Administered 2015-07-11: 5 ug via INTRAVENOUS
  Filled 2015-07-10: qty 4

## 2015-07-10 MED ORDER — MIDODRINE HCL 5 MG PO TABS
5.0000 mg | ORAL_TABLET | ORAL | Status: DC
  Administered 2015-07-11: 5 mg via ORAL

## 2015-07-10 MED ORDER — SODIUM CHLORIDE 0.9 % IV SOLN: 100.0000 mL | INTRAVENOUS | Status: DC | PRN

## 2015-07-10 MED ORDER — CINACALCET HCL 30 MG PO TABS
60.0000 mg | ORAL_TABLET | ORAL | Status: DC
  Administered 2015-07-11: 60 mg via ORAL
  Filled 2015-07-10: qty 2

## 2015-07-10 MED ORDER — INFLUENZA VAC SPLIT QUAD 0.5 ML IM SUSY
0.5000 mL | PREFILLED_SYRINGE | Freq: Once | INTRAMUSCULAR | Status: AC
  Administered 2015-07-10: 0.5 mL via INTRAMUSCULAR
  Filled 2015-07-10: qty 0.5

## 2015-07-10 MED ORDER — ALTEPLASE 2 MG IJ SOLR
2.0000 mg | Freq: Once | INTRAMUSCULAR | Status: DC | PRN
  Filled 2015-07-10: qty 2

## 2015-07-10 MED ORDER — DIGOXIN 0.25 MG/ML IJ SOLN
0.2500 mg | Freq: Once | INTRAMUSCULAR | Status: AC
  Administered 2015-07-10: 0.25 mg via INTRAVENOUS
  Filled 2015-07-10: qty 2

## 2015-07-10 MED ORDER — LIDOCAINE-PRILOCAINE 2.5-2.5 % EX CREA: 1.0000 "application " | TOPICAL_CREAM | CUTANEOUS | Status: DC | PRN

## 2015-07-10 NOTE — Consult Note (Signed)
[facility] Renal Consultation Note    Indication for Consultation:  Management of ESRD/hemodialysis; anemia, hypertension/volume and secondary hyperparathyroidism  HPI: [name] is a 81 y.o. male with ESRD (MWF [facility]) secondary to nephrosclerosis and cardiorenal syndrome on HD since December 2013 with a significant cardiac history including ICM, CAD s/p PCI,  afib on chronic coumadin, hx ICD/left PM, hx GIB 6/2062m hx prostate cancer, hypothyroidism who was doing well when see on Monday by his primary nephrologist Dr. Lorrene Reid. Dialysis goals are modest (0.6 - 1.5 on most occasions). BP are chronically low in the 90 - 110 range. He attains his EDW of 78 and Hgb is stable +/- 12.   Yesterday he presented to Dr. Zenia Resides office post dialysis with ^ HR in the 120s and BP in the 80-90s.  HR Monday at dialysis were also elevated 119 pre to 114 post up from usual HRs i the 60s. He complained of weakness, but denied N, V, CP, diaphoresis.  EKG showed wide complex tachycardia. K was 3.5.  Pt told Dr. Terrence Dupont that he had stopped his amiodarone and carvedilol due to low BP. He feels much better now.  "That girl did something with the machine over there and my heart is better"  Past Medical History  Diagnosis Date  . Dyslipidemia   . CHF (congestive heart failure), NYHA class II   . Atrial fibrillation   . Prostate cancer   . Depression   . Shortness of breath   . Gout   . Myocardial infarction   . Hypothyroidism   . Chronic kidney disease     . Hemodialysis TTS  . Arthritis   . ICD (implantable cardiac defibrillator) in place   . Coronary artery disease     Sees Dr. Lovena Le, [facility] cardiology  . Hypertension     sees Dr. Katherine Roan   Past Surgical History  Procedure Laterality Date  . Medtronic biv icd  10/2006  . Pci/stent  02/2005  . Total knee arthroplasty    . Av fistula placement  10/13/2012    Procedure: ARTERIOVENOUS (AV) FISTULA CREATION;  Surgeon: Mal Misty,  MD;  Location: [place];  Service: Vascular;  Laterality: Right;  Creation right arm arteriovenous fistula   . Insertion of dialysis catheter  10/13/2012    Procedure: INSERTION OF DIALYSIS CATHETER;  Surgeon: Mal Misty, MD;  Location: [place];  Service: Vascular;  Laterality: N/A;  right internal jugular vein  . Cardiac catheterization    . Ligation of competing branches of arteriovenous fistula  11/24/2012    Procedure: LIGATION OF COMPETING BRANCHES OF ARTERIOVENOUS FISTULA;  Surgeon: Rosetta Posner, MD;  Location: [facility] OR;  Service: Vascular;  Laterality: Right;  ligation of competing branch right arteriovenous fistula  . Av fistula placement Left 01/08/2013    Procedure: ARTERIOVENOUS (AV) FISTULA CREATION;  Surgeon: Elam Dutch, MD;  Location: [place];  Service: Vascular;  Laterality: Left;  . Cardioversion N/A 07/21/2012    Procedure: CARDIOVERSION;  Surgeon: Evans Lance, MD;  Location: [facility] CATH LAB;  Service: Cardiovascular;  Laterality: N/A;  . Esophagogastroduodenoscopy Left 04/09/2015    Procedure: ESOPHAGOGASTRODUODENOSCOPY (EGD);  Surgeon: Laurence Spates, MD;  Location: [facility] ENDOSCOPY;  Service: Endoscopy;  Laterality: Left;  . Colonoscopy N/A 04/10/2015    Procedure: COLONOSCOPY;  Surgeon: Laurence Spates, MD;  Location: [place];  Service: Endoscopy;  Laterality: N/A;   Family History  Problem Relation Age of Onset  . Diabetes Mother  Social History:  reports that he quit smoking about 46 years ago. His smoking use included Cigarettes and Cigars. He has never used smokeless tobacco. He reports that he does not drink alcohol or use illicit drugs. No Known Allergies Prior to Admission medications   Medication Sig Start Date End Date Taking? Authorizing Provider  allopurinol (ZYLOPRIM) 100 MG tablet Take 100 mg by mouth daily.  06/12/12  Yes Historical Provider, MD  amiodarone (PACERONE) 200 MG tablet Take 200 mg by mouth daily.    Yes Historical Provider, MD  atorvastatin (LIPITOR)  40 MG tablet Take 40 mg by mouth daily.     Yes Historical Provider, MD  Calcium Acetate, Phos Binder, (CALCIUM ACETATE PO) Take 2 capsules by mouth daily.   Yes Historical Provider, MD  levothyroxine (SYNTHROID, LEVOTHROID) 25 MCG tablet Take 1 tablet (25 mcg total) by mouth daily before breakfast. 10/18/12  Yes Charolette Forward, MD  Multiple Vitamin (MULTIVITAMIN WITH MINERALS) TABS Take 1 tablet by mouth daily.   Yes Historical Provider, MD  SENSIPAR 30 MG tablet Take 30 mg by mouth daily. 06/17/15  Yes Historical Provider, MD  warfarin (COUMADIN) 2 MG tablet Take 2-4 mg by mouth See admin instructions. Pt takes 2mg  on Sundays, pt takes 4mg  all other days of week   Yes Historical Provider, MD   Current Facility-Administered Medications  Medication Dose Route Frequency Provider Last Rate Last Dose  . 0.9 %  sodium chloride infusion   Intravenous Continuous Charolette Forward, MD 10 mL/hr at 07/09/15 1850    . acetaminophen (TYLENOL) tablet 650 mg  650 mg Oral Q4H PRN Charolette Forward, MD      . allopurinol (ZYLOPRIM) tablet 100 mg  100 mg Oral Daily Charolette Forward, MD   100 mg at 07/10/15 1031  . amiodarone (NEXTERONE PREMIX) 360 MG/200ML (1.8 mg/mL) IV infusion  30 mg/hr Intravenous Continuous Charolette Forward, MD 16.7 mL/hr at 07/10/15 0835 30 mg/hr at 07/10/15 0835  . aspirin EC tablet 81 mg  81 mg Oral Daily Charolette Forward, MD   81 mg at 07/10/15 1031  . atorvastatin (LIPITOR) tablet 40 mg  40 mg Oral Daily Charolette Forward, MD   40 mg at 07/10/15 1031  . calcium acetate (PHOSLO) capsule 667 mg  667 mg Oral TID WC Charolette Forward, MD   667 mg at 07/10/15 1032  . multivitamin with minerals tablet 1 tablet  1 tablet Oral Daily Charolette Forward, MD   1 tablet at 07/10/15 1031  . nitroGLYCERIN (NITROSTAT) SL tablet 0.4 mg  0.4 mg Sublingual Q5 Min x 3 PRN Charolette Forward, MD      . ondansetron (ZOFRAN) injection 4 mg  4 mg Intravenous Q6H PRN Charolette Forward, MD       Labs: Basic Metabolic Panel:  Recent Labs Lab  07/09/15 1834 07/10/15 0618  NA 140 141  K 3.4* 3.5  CL 95* 97*  CO2 35* 31  GLUCOSE 137* 104*  BUN 20 30*  CREATININE 3.96* 4.77*  CALCIUM 7.9* 8.1*   Liver Function Tests:  Recent Labs Lab 07/09/15 1834  AST 44*  ALT 37  ALKPHOS 200*  BILITOT 1.0  PROT 6.7  ALBUMIN 3.1*   CBC:  Recent Labs Lab 07/09/15 1834 07/10/15 0618  WBC 4.7 5.5  NEUTROABS 2.7  --   HGB 11.9* 12.6*  HCT 36.5* 39.0  MCV 88.6 89.0  PLT 120* 122*   Cardiac Enzymes:  Recent Labs Lab 07/09/15 1834 07/09/15 2354 07/10/15 0618  TROPONINI 0.57*  0.52* 0.57*   Lab Results  Component Value Date   INR 3.13* 07/10/2015   INR 3.15* 07/09/2015   INR 1.52* 04/11/2015    ROS: As per HPI otherwise negative.  Physical Exam: Filed Vitals:   07/10/15 0018 07/10/15 0044 07/10/15 0430 07/10/15 1000  BP: 95/58 95/65 110/69 100/67  Pulse: 65 113 117 130  Temp: 98.6 F (37 C)  98.7 F (37.1 C)   TempSrc: Oral  Oral   Resp: 18  18   Height:      Weight:   78.064 kg (172 lb 1.6 oz)   SpO2: 100%  99%      General: Well developed, well nourished, in no acute distress. Head: Normocephalic, atraumatic, sclera non-icteric, mucus membranes are moist Neck: Supple. JVD not elevated. Lungs: Clear bilaterally to auscultation without wheezes, rales, or rhonchi. Poor expansion Breathing is unlabored. Heart: RRR with S1 S2. Rate 80s  Abdomen: Soft, non-tender, non-distended with normoactive bowel sounds.M-S:  Strength and tone appear normal for age. Lower extremities:without edema  no open wounds  Neuro: Alert and oriented X 3. Moves all extremities spontaneously. Psych:  Responds to questions appropriately with a normal affect. Dialysis Access: left upper AVF + bruit  Dialysis Orders: Center: [facility] 4 hr 180 2K 2 Ca 400/800 EDW 78 profile 4 L AVF no heparin Hectorol 5 venofer 100 x 10  - last mircera 50 on 9/7 Recent labs:  Hgb 12.1 9/7 INR 3.46 22%sat ferritin 724 July iPTH  163  Assessment/Plan: 1. Wide complex tachycardia -serial troponins stable in 0.5 range x3  HR not responsive to IV amiodarone. PM interrogated by EP and reprogrammed.  2. ESRD -  MWF keep K > 4; may need 3 K bath at discharge K 3.5 - HD tomorrow-  3. chronic low BP/volume  - volume ok midodrine pre HD 4. Anemia  - no indication for ESA - continue Fe replettion after d/c Hgb 12.6 5. Metabolic bone disease -  Continue Hectorol 5, no binders on his med list from [place] and he doesn't think he takes any 6. Nutrition - allowing heart diet higher in K -  7. Thrombocytopenia - platelets 120s* 8. Afib/Anticoagnulation - INR low 3s, back on amio, no coreg yet  Myriam Jacobson, PA-C [place] 07/10/2015, 11:20 AM

## 2015-07-10 NOTE — Consult Note (Signed)
ELECTROPHYSIOLOGY CONSULT NOTE    Patient ID: [name] MRN: [id number], DOB/AGE: 25-Apr-1930 81 y.o.  Admit date: 07/09/2015 Date of Consult: 07/10/2015  Primary Physician: [name], MD Primary Cardiologist: Tamala Julian Electrophysiologist: Lovena Le  Reason for Consultation: tachycardia  HPI:  [name] is a 81 y.o. male with a past medical history significant for hyperlipidemia, chronic systolic heart failure, prostate cancer, ESRD (on HD), hypertension, and persistent atrial fibrillation.  He was at HD on Monday and Wednesday of this week where they noted that his HR was increased and he was referred to Dr Terrence Dupont for further evaluation.  He was admitted and placed on IV amiodarone without significant change in HR and EP has been asked to evaluate for treatment options.  Device interrogation today demonstrates atrial tachycardia at a cycle length of 432msec that was below mode switch rate and so the device was tracking at 130bpm.  Device reprogrammed today to [place] with a base rate of 70 to promote CRT pacing.  He had been in persistent atrial fibrillation for several months, but appeared to have been maintaining SR by device interrogation since mid-April. There is an abrupt increase in ventricular rates on cardiac compass that occurred recently and likely correlates with onset of atrial tachycardia. The patient is asymptomatic with his tachycardia. He denies chest pain and states that his shortness of breath has been stable. He has not had recent fevers, chills, dizziness or syncope.   Past Medical History  Diagnosis Date  . Dyslipidemia   . CHF (congestive heart failure), NYHA class II   . Atrial fibrillation   . Prostate cancer   . Depression   . Gout   . Hypothyroidism   . ESRD (end stage renal disease) on dialysis   . Arthritis   . Coronary artery disease     a. s/p PTCA/DES to LAD 2006  . Hypertension   . Ischemic cardiomyopathy     a. s/p MDT CRTD      Surgical History:  Past Surgical History  Procedure Laterality Date  . Medtronic biv icd  10/2006    Dr Lovena Le  . Total knee arthroplasty    . Av fistula placement  10/13/2012    Procedure: ARTERIOVENOUS (AV) FISTULA CREATION;  Surgeon: Mal Misty, MD;  Location: [place];  Service: Vascular;  Laterality: Right;  Creation right arm arteriovenous fistula   . Insertion of dialysis catheter  10/13/2012    Procedure: INSERTION OF DIALYSIS CATHETER;  Surgeon: Mal Misty, MD;  Location: [place];  Service: Vascular;  Laterality: N/A;  right internal jugular vein  . Cardiac catheterization  02/2005    PTCA and DES to LAD Dr Terrence Dupont  . Ligation of competing branches of arteriovenous fistula  11/24/2012    Procedure: LIGATION OF COMPETING BRANCHES OF ARTERIOVENOUS FISTULA;  Surgeon: Rosetta Posner, MD;  Location: [facility] OR;  Service: Vascular;  Laterality: Right;  ligation of competing branch right arteriovenous fistula  . Av fistula placement Left 01/08/2013    Procedure: ARTERIOVENOUS (AV) FISTULA CREATION;  Surgeon: Elam Dutch, MD;  Location: [place];  Service: Vascular;  Laterality: Left;  . Cardioversion N/A 07/21/2012    Procedure: CARDIOVERSION;  Surgeon: Evans Lance, MD;  Location: [facility] CATH LAB;  Service: Cardiovascular;  Laterality: N/A;  . Esophagogastroduodenoscopy Left 04/09/2015    Procedure: ESOPHAGOGASTRODUODENOSCOPY (EGD);  Surgeon: Laurence Spates, MD;  Location: [facility] ENDOSCOPY;  Service: Endoscopy;  Laterality: Left;  . Colonoscopy N/A 04/10/2015  Procedure: COLONOSCOPY;  Surgeon: Laurence Spates, MD;  Location: [facility] ENDOSCOPY;  Service: Endoscopy;  Laterality: N/A;     Prescriptions prior to admission  Medication Sig Dispense Refill Last Dose  . allopurinol (ZYLOPRIM) 100 MG tablet Take 100 mg by mouth daily.    07/09/2015 at Unknown time  . amiodarone (PACERONE) 200 MG tablet Take 200 mg by mouth daily.    07/09/2015 at Unknown time  . atorvastatin (LIPITOR) 40 MG tablet Take 40 mg by  mouth daily.     07/09/2015 at Unknown time  . Calcium Acetate, Phos Binder, (CALCIUM ACETATE PO) Take 2 capsules by mouth daily.   07/09/2015 at Unknown time  . levothyroxine (SYNTHROID, LEVOTHROID) 25 MCG tablet Take 1 tablet (25 mcg total) by mouth daily before breakfast. 30 tablet 3 07/09/2015 at Unknown time  . Multiple Vitamin (MULTIVITAMIN WITH MINERALS) TABS Take 1 tablet by mouth daily.   07/09/2015 at Unknown time  . SENSIPAR 30 MG tablet Take 30 mg by mouth daily.   07/09/2015 at Unknown time  . warfarin (COUMADIN) 2 MG tablet Take 2-4 mg by mouth See admin instructions. Pt takes 2mg  on Sundays, pt takes 4mg  all other days of week   07/09/2015 at Unknown time    Inpatient Medications:  . allopurinol  100 mg Oral Daily  . aspirin EC  81 mg Oral Daily  . atorvastatin  40 mg Oral Daily  . calcium acetate  667 mg Oral TID WC  . [START ON 07/11/2015] cinacalcet  60 mg Oral Q M,W,F-1800  . [START ON 07/11/2015] doxercalciferol  5 mcg Intravenous Q M,W,F-HD  . [START ON 07/11/2015] midodrine  5 mg Oral Q M,W,F-HD  . multivitamin with minerals  1 tablet Oral Daily    Allergies: No Known Allergies  Social History   Social History  . Marital Status: Married    Spouse Name: N/A  . Number of Children: N/A  . Years of Education: N/A   Occupational History  . retired    Social History Main Topics  . Smoking status: Former Smoker    Types: Cigarettes, Cigars    Quit date: 10/25/1968  . Smokeless tobacco: Never Used     Comment: stopped smoking in 1970  . Alcohol Use: No  . Drug Use: No  . Sexual Activity: Not on file   Other Topics Concern  . Not on file   Social History Narrative     Family History  Problem Relation Age of Onset  . Diabetes Mother      Review of Systems: General: No chills, fever, night sweats or weight changes  Cardiovascular:  No chest pain, dyspnea on exertion, edema, orthopnea, palpitations, paroxysmal nocturnal dyspnea Dermatological: No rash, lesions  or masses Respiratory: No cough, dyspnea Urologic: No hematuria, dysuria Abdominal: No nausea, vomiting, diarrhea, bright red blood per rectum, melena, or hematemesis Neurologic: No visual changes, weakness, changes in mental status All other systems reviewed and are otherwise negative except as noted above.  Physical Exam: Filed Vitals:   07/10/15 0044 07/10/15 0430 07/10/15 1000 07/10/15 1139  BP: 95/65 110/69 100/67 105/56  Pulse: 113 117 130 72  Temp:  98.7 F (37.1 C)  97.9 F (36.6 C)  TempSrc:  Oral  Oral  Resp:  18    Height:      Weight:  172 lb 1.6 oz (78.064 kg)    SpO2:  99%  100%    GEN- The patient is well appearing, alert and oriented x 3  today.   HEENT: normocephalic, atraumatic; sclera clear, conjunctiva pink; hearing intact; oropharynx clear; neck supple  Lungs- Clear to ausculation bilaterally, normal work of breathing.  No wheezes, rales, rhonchi Heart- Regular rate and rhythm (paced) GI- soft, non-tender, non-distended, bowel sounds present Extremities- no clubbing, cyanosis, or edema; DP/PT/radial pulses 2+ bilaterally MS- no significant deformity or atrophy Skin- warm and dry, no rash or lesion Psych- euthymic mood, full affect Neuro- strength and sensation are intact  Labs:   Lab Results  Component Value Date   WBC 5.5 07/10/2015   HGB 12.6* 07/10/2015   HCT 39.0 07/10/2015   MCV 89.0 07/10/2015   PLT 122* 07/10/2015     Recent Labs Lab 07/09/15 1834 07/10/15 0618  NA 140 141  K 3.4* 3.5  CL 95* 97*  CO2 35* 31  BUN 20 30*  CREATININE 3.96* 4.77*  CALCIUM 7.9* 8.1*  PROT 6.7  --   BILITOT 1.0  --   ALKPHOS 200*  --   ALT 37  --   AST 44*  --   GLUCOSE 137* 104*      Radiology/Studies: No results found.  OQH:UTMLYY tachycardia with V pacing at 130  TELEMETRY: atrial tachycardia with V pacing at 130  DEVICE HISTORY: MDT Consulta CRTD implanted 2012 Dr Lovena Le  Assessment/Plan: 1.  Atrial tachycardia The patient has been  found to have atrial tachycardia that was below his mode switch detection and he was tracking at his upper tracking limit. Device reprogrammed today to DDIR to prevent inappropriate tracking Will discuss with Dr Eliyas Suddreth attempting to pace terminate atrial tachycardia - cycle length 47msec  2.  Persistent atrial fibrillation He has been maintained on amiodarone and has had SR since mid April of this year His atrial fibrillation is asymptomatic, but he clearly has greater benefit from CRT pacing while in SR Continue Warfarin for CHADS2VASC of at least 5  3.  ICM/Chronic systolic heart failure Euvolemic on exam Continue current therapy ICD interrogated today and functioning normally (see paper chart) - his device is quickly approaching ERI. He has never had appropriate therapy for ventricular arrhythmias. Would consider downgrade to CRTP at gen change - he has an appt with Dr Lovena Le coming up to discuss  4. ESRD On HD - management per nephrology  Ok from an EP standpoint to discharge home today on home dose of amiodarone. No further inpatient EP work up planned. Please call with questions.    Signed, Chanetta Marshall, NP 07/10/2015 12:03 PM   I have seen, examined the patient, and reviewed the above assessment and plan. On exam RRR (paced). Changes to above are made where necessary.   I was [name] to successfully pace terminate atrial tachycardia.  OK to convert back to PO amiodarone and discharge  Electrophysiology team to see as needed while here. Please call with questions.   Thompson Grayer MD, [facility] 07/10/2015 5:47 PM

## 2015-07-10 NOTE — Progress Notes (Signed)
Notified Dr. Terrence Dupont regarding the result of troponin this am, HR still in 112-118 bpm, on continuous Amiodarone drip,asymptomatic. Will continue to monitor pt.   07/10/15 0430  Vitals  Temp 98.7 F (37.1 C)  Temp Source Oral  BP 110/69 mmHg  BP Location Right Arm  BP Method Automatic  Patient Position (if appropriate) Lying  Pulse Rate (!) 117  Pulse Rate Source Dinamap  Resp 18  Oxygen Therapy  SpO2 99 %  O2 Device Room Air  Height and Weight  Weight 78.064 kg (172 lb 1.6 oz)  Type of Scale Used Standing (Scale B)  Type of Weight Actual

## 2015-07-10 NOTE — Progress Notes (Signed)
Subjective:  Patient denies any chest pain or shortness of breath. Troponin minimally elevated due to demand ischemia and doubt significant MI. Appreciate EP consult and help ICD reprogrammed  Objective:  Vital Signs in the last 24 hours: Temp:  [97.9 F (36.6 C)-98.7 F (37.1 C)] 97.9 F (36.6 C) (09/15 1139) Pulse Rate:  [65-130] 72 (09/15 1139) Resp:  [18] 18 (09/15 0430) BP: (91-110)/(56-71) 105/56 mmHg (09/15 1139) SpO2:  [99 %-100 %] 100 % (09/15 1139) Weight:  [77.384 kg (170 lb 9.6 oz)-78.064 kg (172 lb 1.6 oz)] 78.064 kg (172 lb 1.6 oz) (09/15 0430)  Intake/Output from previous day: 09/14 0701 - 09/15 0700 In: 583.8 [P.O.:120; I.V.:463.8] Out: -  Intake/Output from this shift: Total I/O In: 240 [P.O.:240] Out: -   Physical Exam: Neck: no adenopathy, no carotid bruit, no JVD and supple, symmetrical, trachea midline Lungs: clear to auscultation bilaterally Heart: regular rate and rhythm, S1, S2 normal and Soft systolic murmur noted no S3 gallop no pericardial rub Abdomen: soft, non-tender; bowel sounds normal; no masses,  no organomegaly Extremities: extremities normal, atraumatic, no cyanosis or edema  Lab Results:  Recent Labs  07/09/15 1834 07/10/15 0618  WBC 4.7 5.5  HGB 11.9* 12.6*  PLT 120* 122*    Recent Labs  07/09/15 1834 07/10/15 0618  NA 140 141  K 3.4* 3.5  CL 95* 97*  CO2 35* 31  GLUCOSE 137* 104*  BUN 20 30*  CREATININE 3.96* 4.77*    Recent Labs  07/09/15 2354 07/10/15 0618  TROPONINI 0.52* 0.57*   Hepatic Function Panel  Recent Labs  07/09/15 1834  PROT 6.7  ALBUMIN 3.1*  AST 44*  ALT 37  ALKPHOS 200*  BILITOT 1.0    Recent Labs  07/10/15 0618  CHOL 101   No results for input(s): PROTIME in the last 72 hours.  Imaging: Imaging results have been reviewed and No results found.  Cardiac Studies:  Assessment/Plan:  Status post probable atrial tachycardia Minimally elevated troponin I second-degree to demand  ischemia and doubt significant MI Chronic atrial fibrillation. Coronary artery disease, history of anterolateral wall MI in the past. Ischemic cardiomyopathy, status post bi-V ICD in the past. Compensated systolic congestive heart failure. Hypertension Hyperlipidemia Hypothyroidism. End-stage renal disease on hemodialysis. History of probable upper GI bleed and recent past secondary to Coumadin toxicity. Plan Continue present management for now Will DC home once okay with EP  Dr. Doylene Canard on-call for me.  LOS: 1 day    [name] 07/10/2015, 12:03 PM

## 2015-07-10 NOTE — Progress Notes (Signed)
Utilization review completed. Derrill Bagnell, RN, BSN. 

## 2015-07-10 NOTE — Care Management Note (Signed)
Case Management Note  Patient Details  Name: [name] MRN: [id number] Date of Birth: 1929/12/05  Subjective/Objective:    Admitted with Wide complex Tachycardia              Action/Plan: Patient lives at home with spouse, use a walker and a cane at home. Has private insurance with Medicare and prescription coverage with BCBS. [facility] of choice is [facility] and states that he does not have any problem getting his medication. Patient has scales at home but does not weigh himself daily. Has HD 3 days a week M/W/F; Patient could benefit from HHPT, [place] choice offered, patient chose [facility]. Mary with [facility] called for arrangements. Attending MD please enter the face to face document for [facility] services at discharge.  Expected Discharge Date:    possibly 07/10/2015              Expected Discharge Plan:  [name]  Discharge planning Services  CM Consult Choice offered to:  Patient   HH Arranged:  PT [facility] Agency:  [facility]  Status of Service:  In process, will continue to follow  Sherrilyn Rist [phone] 07/10/2015, 3:27 PM

## 2015-07-11 ENCOUNTER — Ambulatory Visit (HOSPITAL_COMMUNITY): Payer: Medicare Other

## 2015-07-11 LAB — RENAL FUNCTION PANEL
Albumin: 3 g/dL — ABNORMAL LOW (ref 3.5–5.0)
Anion gap: 14 (ref 5–15)
BUN: 43 mg/dL — ABNORMAL HIGH (ref 6–20)
CO2: 28 mmol/L (ref 22–32)
Calcium: 8.2 mg/dL — ABNORMAL LOW (ref 8.9–10.3)
Chloride: 96 mmol/L — ABNORMAL LOW (ref 101–111)
Creatinine, Ser: 6.24 mg/dL — ABNORMAL HIGH (ref 0.61–1.24)
GFR calc Af Amer: 9 mL/min — ABNORMAL LOW (ref >60)
GFR calc non Af Amer: 7 mL/min — ABNORMAL LOW (ref >60)
Glucose, Bld: 87 mg/dL (ref 65–99)
Phosphorus: 3.3 mg/dL (ref 2.5–4.6)
Potassium: 3.7 mmol/L (ref 3.5–5.1)
Sodium: 138 mmol/L (ref 135–145)

## 2015-07-11 LAB — CBC
HCT: 37.2 % — ABNORMAL LOW (ref 39.0–52.0)
Hemoglobin: 12.2 g/dL — ABNORMAL LOW (ref 13.0–17.0)
MCH: 28.7 pg (ref 26.0–34.0)
MCHC: 32.8 g/dL (ref 30.0–36.0)
MCV: 87.5 fL (ref 78.0–100.0)
Platelets: 98 10*3/uL — ABNORMAL LOW (ref 150–400)
RBC: 4.25 MIL/uL (ref 4.22–5.81)
RDW: 16.9 % — ABNORMAL HIGH (ref 11.5–15.5)
WBC: 5.2 10*3/uL (ref 4.0–10.5)

## 2015-07-11 MED ORDER — ASPIRIN 81 MG PO TBEC
81.0000 mg | DELAYED_RELEASE_TABLET | Freq: Every day | ORAL | Status: DC
Start: 2015-07-11 — End: 2015-12-19

## 2015-07-11 MED ORDER — DIGOXIN 125 MCG PO TABS
0.0625 mg | ORAL_TABLET | Freq: Every day | ORAL | Status: DC
  Administered 2015-07-11: 0.0625 mg via ORAL
  Filled 2015-07-11: qty 1

## 2015-07-11 MED ORDER — DOXERCALCIFEROL 4 MCG/2ML IV SOLN
INTRAVENOUS | Status: AC
  Filled 2015-07-11: qty 4

## 2015-07-11 MED ORDER — MIDODRINE HCL 5 MG PO TABS
ORAL_TABLET | ORAL | Status: AC
  Filled 2015-07-11: qty 1

## 2015-07-11 MED ORDER — CARVEDILOL 3.125 MG PO TABS: 3.1250 mg | ORAL_TABLET | Freq: Two times a day (BID) | ORAL | Status: AC

## 2015-07-11 MED ORDER — DIGOXIN 62.5 MCG PO TABS: 0.0625 mg | ORAL_TABLET | Freq: Every day | ORAL | Status: DC

## 2015-07-11 NOTE — Progress Notes (Signed)
  Echocardiogram 2D Echocardiogram has been performed.  Diamond Nickel 07/11/2015, 2:46 PM

## 2015-07-11 NOTE — Discharge Summary (Signed)
Physician Discharge Summary  Patient ID: TREK KIMBALL MRN: [id number] DOB/AGE: Mar 15, 1930 81 y.o.  Admit date: 07/09/2015 Discharge date: 07/11/2015  Admission Diagnoses: Status post probable atrial tachycardia Minimally elevated troponin I second-degree to demand ischemia and doubt significant MI Chronic atrial fibrillation. Coronary artery disease, history of anterolateral wall MI in the past. Ischemic cardiomyopathy, status post bi-V ICD in the past. Compensated systolic congestive heart failure. Hypertension Hyperlipidemia Hypothyroidism. End-stage renal disease on hemodialysis. History of probable upper GI bleed and recent past secondary to Coumadin toxicity.  Discharge Diagnoses:  Principle Problem: * Atrial tachycardia with Bi-ventricular pacemaker tracking * Dilated and ischemic Cardiomyopathy Minimally elevated troponin I second-degree to demand ischemia Coronary artery disease History of anterolateral wall MI in the past. Status post bi-V ICD Compensated left heart systolic congestive heart failure. Hypertension Hyperlipidemia Iatrogenic hyperthyroidism End-stage renal disease on hemodialysis Chronic coumadin use History of upper GI bleed secondary to coumadin toxicity Moderate pulmonary hypertension Mild aortic regurgitation Mild to Moderate MR and TR  Discharged Condition: fair  Hospital Course: patient is a 81 year old male with past medical history significant for multiple medical problems, i.e., coronary artery disease, history of anterolateral wall myocardial infarction in the past, status post PCI to LAD, ischemic dilated cardiomyopathy status post bi-V AICD, chronic atrial fibrillation, history of congestive heart failure secondary to depressed LV systolic function, hypertension, hyperlipidemia, hypothyroidism, end-stage renal disease on hemodialysis, status post probable recent upper GI bleed in the setting of Coumadin toxicity, was admitted for atrial  tachycardia and hypotension. EP consult of Dr. Rayann Heman was obtained. He successfully pace terminated his atrial tachycardia and reprogrammed Bi-ventricular pacemaker to avoid atrial tracking. His heart rate was improved with IV amiodarone use. Small dose digoxin and carvedilol was also given for his systolic heart failure and ischemic and dilated cardiomyopathy with EF 15-20 % and levo-thyroxin was discontinued. He will follow with Dr. Robie Ridge, Dr. Lovena Le and Dr. Terrence Dupont as arranged.  Consults: cardiology  Significant Diagnostic Studies: labs: Near normal CBC except low platelets count. Renal function panel with Creatinine of 5.18 and calcium of 8.4. TSH of 0.053. Normal lipid panel  EKG- Atrial tachycardia with biventricular pacing and tracking.  Echocardiogram: - Left ventricle: The cavity size was mildly dilated. Systolic function was severely reduced. The estimated ejection fraction was in the range of 20% to 25%. There is akinesis of the entire anteroseptal myocardium. There is moderate hypokinesis of the entire inferolateral myocardium. Doppler parameters are consistent with abnormal left ventricular relaxation (grade 1 diastolic dysfunction). - Ventricular septum: Septal motion showed paradox. - Aortic valve: Transvalvular velocity was minimally increased, due to stenosis. There was very mild stenosis. There was mild regurgitation. - Mitral valve: There was mild regurgitation. - Left atrium: The atrium was moderately to severely dilated. - Right ventricle: Systolic function was mildly to moderately reduced. - Tricuspid valve: There was moderate regurgitation. - Pulmonary arteries: Systolic pressure was moderately increased. PA peak pressure: 50 mm Hg (S).  Treatments: cardiac meds: carvedilol, digoxin atorvastatin, warfarin and amiodarone. Pace termination of slow atrial tachycardia  Discharge Exam: Blood pressure 122/56, pulse 70, temperature 98.3 F (36.8 C), temperature source  Oral, resp. rate 16, height 5\' 11"  (1.803 m), weight 77 kg (169 lb 12.1 oz), SpO2 99 %. HEENT: [place]/AT, wears glasses, Conj-pink, Sclera-non-icteric. Neck: no adenopathy, no carotid bruit, no JVD and supple, symmetrical, trachea midline Lungs: clear to auscultation bilaterally. Heart: regular rate and rhythm, S1, S2 normal and Soft systolic murmur noted no S3 gallop no pericardial rub  Abdomen: soft, non-tender; bowel sounds normal; no masses, no organomegaly Extremities: extremities normal, atraumatic, no cyanosis or edema. CNS: Cranial nerves grossly intact. Moves all 4 extremities. Slow and steady gait.  Disposition: 01-Home or Self Care     Medication List    TAKE these medications        allopurinol 100 MG tablet  Commonly known as:  ZYLOPRIM  Take 100 mg by mouth daily.     amiodarone 200 MG tablet  Commonly known as:  PACERONE  Take 200 mg by mouth daily.     aspirin 81 MG EC tablet  Take 1 tablet (81 mg total) by mouth daily.     atorvastatin 40 MG tablet  Commonly known as:  LIPITOR  Take 40 mg by mouth daily.     CALCIUM ACETATE PO  Take 2 capsules by mouth daily.     carvedilol 3.125 MG tablet  Commonly known as:  COREG  Take 1 tablet (3.125 mg total) by mouth 2 (two) times daily with a meal.     Digoxin 62.5 MCG Tabs  Take 0.0625 mg by mouth daily.     levothyroxine 25 MCG tablet  Commonly known as:  SYNTHROID, LEVOTHROID  Take 1 tablet (25 mcg total) by mouth daily before breakfast.     multivitamin with minerals Tabs tablet  Take 1 tablet by mouth daily.     SENSIPAR 30 MG tablet  Generic drug:  cinacalcet  Take 30 mg by mouth daily.     warfarin 2 MG tablet  Commonly known as:  COUMADIN  Take 2-4 mg by mouth See admin instructions. Pt takes 2mg  on Sundays, pt takes 4mg  all other days of week           Follow-up Information    Follow up with [facility] JR,GEORGE R, MD. Schedule an appointment as soon as possible for a visit in 2 weeks.    Specialty:  Pulmonary Disease   Contact information:   [place] [address] [phone]       Follow up with Charolette Forward, MD. Schedule an appointment as soon as possible for a visit in 1 month.   Specialty:  Cardiology   Contact information:   [place] [address] [phone]       Follow up with Birdie Riddle, MD. Schedule an appointment as soon as possible for a visit in 1 week.   Specialty:  Cardiology   Contact information:   [place] [address] [phone]       Signed: Birdie Riddle 07/11/2015, 5:04 PM

## 2015-07-11 NOTE — Progress Notes (Signed)
Orders received for pt discharge.  Discharge summary printed and reviewed with pt.  Explained medication regimen, and pt had no further questions at this time.  IV removed and site remains clean, dry, intact.  Telemetry removed.  Pt in stable condition and awaiting transport. 

## 2015-07-11 NOTE — Procedures (Signed)
Tolerating hemodialysis.  Rhythm paced. Stable hemodynamics.  No access issues. [name]

## 2015-07-22 ENCOUNTER — Other Ambulatory Visit: Payer: Self-pay

## 2015-07-22 ENCOUNTER — Ambulatory Visit (INDEPENDENT_AMBULATORY_CARE_PROVIDER_SITE_OTHER): Payer: Medicare Other | Admitting: Internal Medicine

## 2015-07-22 ENCOUNTER — Telehealth: Payer: Self-pay | Admitting: *Deleted

## 2015-07-22 ENCOUNTER — Encounter: Payer: Self-pay | Admitting: *Deleted

## 2015-07-22 ENCOUNTER — Encounter: Payer: Self-pay | Admitting: Internal Medicine

## 2015-07-22 VITALS — BP 110/64 | HR 69 | Ht 71.0 in | Wt 172.2 lb

## 2015-07-22 DIAGNOSIS — I472 Ventricular tachycardia: Secondary | ICD-10-CM | POA: Diagnosis not present

## 2015-07-22 DIAGNOSIS — R000 Tachycardia, unspecified: Secondary | ICD-10-CM

## 2015-07-22 DIAGNOSIS — I255 Ischemic cardiomyopathy: Secondary | ICD-10-CM

## 2015-07-22 DIAGNOSIS — I5022 Chronic systolic (congestive) heart failure: Secondary | ICD-10-CM | POA: Diagnosis not present

## 2015-07-22 DIAGNOSIS — Z95810 Presence of automatic (implantable) cardiac defibrillator: Secondary | ICD-10-CM | POA: Diagnosis not present

## 2015-07-22 DIAGNOSIS — I480 Paroxysmal atrial fibrillation: Secondary | ICD-10-CM

## 2015-07-22 LAB — CBC WITH DIFFERENTIAL/PLATELET
Basophils Absolute: 0 10*3/uL (ref 0.0–0.1)
Basophils Relative: 0.5 % (ref 0.0–3.0)
EOS ABS: 0.2 10*3/uL (ref 0.0–0.7)
Eosinophils Relative: 2.9 % (ref 0.0–5.0)
HCT: 39 % (ref 39.0–52.0)
Hemoglobin: 12.6 g/dL — ABNORMAL LOW (ref 13.0–17.0)
LYMPHS ABS: 1 10*3/uL (ref 0.7–4.0)
Lymphocytes Relative: 19.8 % (ref 12.0–46.0)
MCHC: 32.4 g/dL (ref 30.0–36.0)
MCV: 88.2 fl (ref 78.0–100.0)
MONO ABS: 0.9 10*3/uL (ref 0.1–1.0)
Monocytes Relative: 16.3 % — ABNORMAL HIGH (ref 3.0–12.0)
NEUTROS PCT: 60.5 % (ref 43.0–77.0)
Neutro Abs: 3.2 10*3/uL (ref 1.4–7.7)
Platelets: 139 10*3/uL — ABNORMAL LOW (ref 150.0–400.0)
RBC: 4.42 Mil/uL (ref 4.22–5.81)
RDW: 17.2 % — ABNORMAL HIGH (ref 11.5–15.5)
WBC: 5.2 10*3/uL (ref 4.0–10.5)

## 2015-07-22 LAB — CUP PACEART INCLINIC DEVICE CHECK
Brady Statistic AP VP Percent: 99.97 %
Brady Statistic AS VP Percent: 0.02 %
Brady Statistic AS VS Percent: 0 %
Brady Statistic RV Percent Paced: 99.99 %
HighPow Impedance: 32 Ohm
HighPow Impedance: 41 Ohm
Lead Channel Impedance Value: 418 Ohm
Lead Channel Impedance Value: 437 Ohm
Lead Channel Impedance Value: 475 Ohm
Lead Channel Impedance Value: 532 Ohm
Lead Channel Pacing Threshold Amplitude: 0.875 V
Lead Channel Pacing Threshold Amplitude: 1.125 V
Lead Channel Pacing Threshold Pulse Width: 0.4 ms
Lead Channel Sensing Intrinsic Amplitude: 3 mV
Lead Channel Sensing Intrinsic Amplitude: 3 mV
Lead Channel Setting Pacing Amplitude: 2 V
Lead Channel Setting Pacing Pulse Width: 0.4 ms
Lead Channel Setting Sensing Sensitivity: 0.3 mV
MDC IDC MSMT BATTERY VOLTAGE: 2.62 V
MDC IDC MSMT LEADCHNL LV IMPEDANCE VALUE: 228 Ohm
MDC IDC MSMT LEADCHNL LV PACING THRESHOLD PULSEWIDTH: 0.4 ms
MDC IDC MSMT LEADCHNL RV PACING THRESHOLD AMPLITUDE: 1 V
MDC IDC MSMT LEADCHNL RV PACING THRESHOLD PULSEWIDTH: 0.4 ms
MDC IDC MSMT LEADCHNL RV SENSING INTR AMPL: 14.375 mV
MDC IDC MSMT LEADCHNL RV SENSING INTR AMPL: 7.875 mV
MDC IDC SESS DTM: 20160927141038
MDC IDC SET LEADCHNL LV PACING AMPLITUDE: 2.25 V
MDC IDC SET LEADCHNL RV PACING AMPLITUDE: 2.5 V
MDC IDC SET LEADCHNL RV PACING PULSEWIDTH: 0.4 ms
MDC IDC SET ZONE DETECTION INTERVAL: 400 ms
MDC IDC SET ZONE DETECTION INTERVAL: 450 ms
MDC IDC STAT BRADY AP VS PERCENT: 0.01 %
MDC IDC STAT BRADY RA PERCENT PACED: 99.98 %
Zone Setting Detection Interval: 300 ms
Zone Setting Detection Interval: 350 ms

## 2015-07-22 LAB — PROTIME-INR
INR: 2.4 ratio — AB (ref 0.8–1.0)
PROTHROMBIN TIME: 26.4 s — AB (ref 9.6–13.1)

## 2015-07-22 LAB — BASIC METABOLIC PANEL
BUN: 46 mg/dL — AB (ref 6–23)
CALCIUM: 8.4 mg/dL (ref 8.4–10.5)
CO2: 31 mEq/L (ref 19–32)
CREATININE: 5.62 mg/dL — AB (ref 0.40–1.50)
Chloride: 99 mEq/L (ref 96–112)
GFR: 12.51 mL/min — AB (ref >60.00)
GLUCOSE: 88 mg/dL (ref 70–99)
POTASSIUM: 4.4 meq/L (ref 3.5–5.1)
Sodium: 141 mEq/L (ref 135–145)

## 2015-07-22 NOTE — Assessment & Plan Note (Signed)
He is free from arrhythmias. He will continue his current meds including coumadin.

## 2015-07-22 NOTE — Telephone Encounter (Signed)
Lab called with critical lab values: Creatinine = 5.62 and GFR = 12.51. Reviewed by Dr. Lovena Le. No orders obtained. Patient is ESRD dialysis pt.

## 2015-07-22 NOTE — Progress Notes (Signed)
HPI [name] returns today for followup. He is a very pleasant, 81 year old man with atrial fibrillation, chronic systolic heart failure, status post biventricular ICD implantation. In the interim, he has developed end-stage renal disease and is now on hemodialysis. Previously, he had atrial fibrillation which has been well-controlled on amiodarone therapy. He denies sodium indiscretion. He has had no ICD shock. His heart failure symptoms are class II.Marland Kitchen He denies bleeding. He denies chest pain. No syncope. He does not feel palpitations. He has reached ERI. No Known Allergies   Current Outpatient Prescriptions  Medication Sig Dispense Refill  . allopurinol (ZYLOPRIM) 100 MG tablet Take 100 mg by mouth daily.     Marland Kitchen amiodarone (PACERONE) 200 MG tablet Take 200 mg by mouth daily.     Marland Kitchen aspirin EC 81 MG EC tablet Take 1 tablet (81 mg total) by mouth daily.    Marland Kitchen atorvastatin (LIPITOR) 40 MG tablet Take 40 mg by mouth daily.      . Calcium Acetate, Phos Binder, (CALCIUM ACETATE PO) Take 2 capsules by mouth daily.    . carvedilol (COREG) 3.125 MG tablet Take 1 tablet (3.125 mg total) by mouth 2 (two) times daily with a meal. 60 tablet 1  . digoxin 62.5 MCG TABS Take 0.0625 mg by mouth daily. 30 tablet 3  . Multiple Vitamin (MULTIVITAMIN WITH MINERALS) TABS Take 1 tablet by mouth daily.    . SENSIPAR 30 MG tablet Take 30 mg by mouth daily.    Marland Kitchen warfarin (COUMADIN) 2 MG tablet Take 2-4 mg by mouth See admin instructions. Pt takes 2mg  on Sundays, pt takes 4mg  all other days of week     No current facility-administered medications for this visit.     Past Medical History  Diagnosis Date  . Dyslipidemia   . CHF (congestive heart failure), NYHA class II   . Atrial fibrillation   . Hypothyroidism   . Coronary artery disease     a. s/p PTCA/DES to LAD 2006  . Hypertension   . Ischemic cardiomyopathy     a. s/p MDT CRTD  . AICD (automatic cardioverter/defibrillator) present   . Myocardial  infarction   . Depression     hx  . History of blood transfusion 03/2015    LGIB  . Lower GI bleeding   . Arthritis     "everywhere"  . History of gout   . ESRD (end stage renal disease) on dialysis     "[place]; MWF" (07/10/2015)  . Prostate cancer     S/P seed implants    ROS:   All systems reviewed and negative except as noted in the HPI.   Past Surgical History  Procedure Laterality Date  . Medtronic biv icd  10/2006    Dr Lovena Le  . Total knee arthroplasty Right   . Av fistula placement  10/13/2012    Procedure: ARTERIOVENOUS (AV) FISTULA CREATION;  Surgeon: Mal Misty, MD;  Location: [place];  Service: Vascular;  Laterality: Right;  Creation right arm arteriovenous fistula   . Insertion of dialysis catheter  10/13/2012    Procedure: INSERTION OF DIALYSIS CATHETER;  Surgeon: Mal Misty, MD;  Location: [place];  Service: Vascular;  Laterality: N/A;  right internal jugular vein  . Ligation of competing branches of arteriovenous fistula  11/24/2012    Procedure: LIGATION OF COMPETING BRANCHES OF ARTERIOVENOUS FISTULA;  Surgeon: Rosetta Posner, MD;  Location: [place];  Service: Vascular;  Laterality: Right;  ligation of competing  branch right arteriovenous fistula  . Av fistula placement Left 01/08/2013    Procedure: ARTERIOVENOUS (AV) FISTULA CREATION;  Surgeon: Elam Dutch, MD;  Location: [place];  Service: Vascular;  Laterality: Left;  . Cardioversion N/A 07/21/2012    Procedure: CARDIOVERSION;  Surgeon: Evans Lance, MD;  Location: [facility] CATH LAB;  Service: Cardiovascular;  Laterality: N/A;  . Esophagogastroduodenoscopy Left 04/09/2015    Procedure: ESOPHAGOGASTRODUODENOSCOPY (EGD);  Surgeon: Laurence Spates, MD;  Location: [facility] ENDOSCOPY;  Service: Endoscopy;  Laterality: Left;  . Colonoscopy N/A 04/10/2015    Procedure: COLONOSCOPY;  Surgeon: Laurence Spates, MD;  Location: [place];  Service: Endoscopy;  Laterality: N/A;  . Joint replacement    . Biv icd genertaor  change out  X 1    "replaced the battery"  . Coronary angioplasty with stent placement  02/2005    PTCA and DES to LAD Dr Terrence Dupont  . Insertion prostate radiation seed       Family History  Problem Relation Age of Onset  . Diabetes Mother      Social History   Social History  . Marital Status: Married    Spouse Name: N/A  . Number of Children: N/A  . Years of Education: N/A   Occupational History  . retired    Social History Main Topics  . Smoking status: Former Smoker -- 10 years    Types: Cigarettes, Cigars  . Smokeless tobacco: Never Used     Comment: stopped smoking in 1970  . Alcohol Use: Yes     Comment: "stopped drinking alcohol in the 1980's"  . Drug Use: No  . Sexual Activity: Not on file   Other Topics Concern  . Not on file   Social History Narrative     BP 110/64 mmHg  Pulse 69  Ht 5\' 11"  (1.803 m)  Wt 172 lb 3.2 oz (78.109 kg)  BMI 24.03 kg/m2  Physical Exam:  well appearing 81 year old man, NAD HEENT: Unremarkable Neck:  6 cm JVD, no thyromegally Lungs:  Clear with no wheezes, rales, or rhonchi. HEART:  Regular rate rhythm, no murmurs, no rubs, no clicks Abd:  soft, positive bowel sounds, no organomegally, no rebound, no guarding Ext:  2 plus pulses, no edema, no cyanosis, no clubbing, left arm with a thrill in upper arm. Skin:  No rashes no nodules Neuro:  CN II through XII intact, motor grossly intact   DEVICE  Normal device function.  See PaceArt for details. Underlying rhythm is nsr  Assess/Plan:

## 2015-07-22 NOTE — Assessment & Plan Note (Signed)
He has had no recent episodes of sustained VT. Will follow.

## 2015-07-22 NOTE — Patient Instructions (Signed)
Medication Instructions:  Your physician recommends that you continue on your current medications as directed. Please refer to the Current Medication list given to you today.   Labwork: Your physician recommends that you return for lab work today: BMP/CBC/INR   Testing/Procedures: Battery change out on device on 08/07/15  Follow-Up:  Your physician recommends that you schedule a follow-up appointment in: 7-10 days from 08/07/15 in device clinic for wound check and 3 months with Dr Lovena Le from 08/07/15   Any Other Special Instructions Will Be Listed Below (If Applicable).

## 2015-07-22 NOTE — Assessment & Plan Note (Signed)
He denies anginal symptoms. No medication changes.

## 2015-07-22 NOTE — Assessment & Plan Note (Signed)
His device has reached ERI. He will continue his current meds.

## 2015-08-04 ENCOUNTER — Encounter: Payer: Self-pay | Admitting: Internal Medicine

## 2015-08-05 ENCOUNTER — Telehealth: Payer: Self-pay | Admitting: Internal Medicine

## 2015-08-05 NOTE — Telephone Encounter (Signed)
New message     Patient has an upcoming procedure on  10.13.2016   C/O  deep cough - nothing comes up . Please advise on if appt should be kept or not.

## 2015-08-07 ENCOUNTER — Ambulatory Visit (HOSPITAL_COMMUNITY)
Admission: RE | Admit: 2015-08-07 | Discharge: 2015-08-07 | Disposition: A | Discharge status: Home / Self Care | Payer: Medicare Other | Source: Ambulatory Visit | Attending: Internal Medicine | Admitting: Internal Medicine

## 2015-08-07 ENCOUNTER — Encounter (HOSPITAL_COMMUNITY): Payer: Self-pay | Admitting: Internal Medicine

## 2015-08-07 ENCOUNTER — Encounter (HOSPITAL_COMMUNITY)
Admission: RE | Disposition: A | Discharge status: Home / Self Care | Payer: Self-pay | Source: Ambulatory Visit | Attending: Internal Medicine

## 2015-08-07 DIAGNOSIS — F329 Major depressive disorder, single episode, unspecified: Secondary | ICD-10-CM | POA: Insufficient documentation

## 2015-08-07 DIAGNOSIS — E039 Hypothyroidism, unspecified: Secondary | ICD-10-CM | POA: Insufficient documentation

## 2015-08-07 DIAGNOSIS — Z7982 Long term (current) use of aspirin: Secondary | ICD-10-CM | POA: Diagnosis not present

## 2015-08-07 DIAGNOSIS — Z7901 Long term (current) use of anticoagulants: Secondary | ICD-10-CM | POA: Diagnosis not present

## 2015-08-07 DIAGNOSIS — Z4502 Encounter for adjustment and management of automatic implantable cardiac defibrillator: Secondary | ICD-10-CM | POA: Insufficient documentation

## 2015-08-07 DIAGNOSIS — Z955 Presence of coronary angioplasty implant and graft: Secondary | ICD-10-CM | POA: Diagnosis not present

## 2015-08-07 DIAGNOSIS — M1990 Unspecified osteoarthritis, unspecified site: Secondary | ICD-10-CM | POA: Diagnosis not present

## 2015-08-07 DIAGNOSIS — Z8546 Personal history of malignant neoplasm of prostate: Secondary | ICD-10-CM | POA: Diagnosis not present

## 2015-08-07 DIAGNOSIS — I5022 Chronic systolic (congestive) heart failure: Secondary | ICD-10-CM | POA: Diagnosis not present

## 2015-08-07 DIAGNOSIS — Z87891 Personal history of nicotine dependence: Secondary | ICD-10-CM | POA: Insufficient documentation

## 2015-08-07 DIAGNOSIS — Z992 Dependence on renal dialysis: Secondary | ICD-10-CM | POA: Insufficient documentation

## 2015-08-07 DIAGNOSIS — E785 Hyperlipidemia, unspecified: Secondary | ICD-10-CM | POA: Insufficient documentation

## 2015-08-07 DIAGNOSIS — I2510 Atherosclerotic heart disease of native coronary artery without angina pectoris: Secondary | ICD-10-CM | POA: Diagnosis not present

## 2015-08-07 DIAGNOSIS — I480 Paroxysmal atrial fibrillation: Secondary | ICD-10-CM

## 2015-08-07 DIAGNOSIS — I252 Old myocardial infarction: Secondary | ICD-10-CM | POA: Diagnosis not present

## 2015-08-07 DIAGNOSIS — Z95810 Presence of automatic (implantable) cardiac defibrillator: Secondary | ICD-10-CM

## 2015-08-07 DIAGNOSIS — M109 Gout, unspecified: Secondary | ICD-10-CM | POA: Insufficient documentation

## 2015-08-07 DIAGNOSIS — I4891 Unspecified atrial fibrillation: Secondary | ICD-10-CM | POA: Insufficient documentation

## 2015-08-07 DIAGNOSIS — R000 Tachycardia, unspecified: Secondary | ICD-10-CM

## 2015-08-07 DIAGNOSIS — I447 Left bundle-branch block, unspecified: Secondary | ICD-10-CM | POA: Diagnosis not present

## 2015-08-07 DIAGNOSIS — I472 Ventricular tachycardia: Secondary | ICD-10-CM

## 2015-08-07 DIAGNOSIS — I132 Hypertensive heart and chronic kidney disease with heart failure and with stage 5 chronic kidney disease, or end stage renal disease: Secondary | ICD-10-CM | POA: Insufficient documentation

## 2015-08-07 DIAGNOSIS — N186 End stage renal disease: Secondary | ICD-10-CM | POA: Diagnosis not present

## 2015-08-07 DIAGNOSIS — I255 Ischemic cardiomyopathy: Secondary | ICD-10-CM | POA: Diagnosis not present

## 2015-08-07 HISTORY — PX: EP IMPLANTABLE DEVICE: SHX172B

## 2015-08-07 LAB — SURGICAL PCR SCREEN
MRSA, PCR: NEGATIVE
Staphylococcus aureus: NEGATIVE

## 2015-08-07 LAB — PROTIME-INR
INR: 1.94 — ABNORMAL HIGH (ref 0.00–1.49)
Prothrombin Time: 22.1 seconds — ABNORMAL HIGH (ref 11.6–15.2)

## 2015-08-07 LAB — CBC
HEMATOCRIT: 35.2 % — AB (ref 39.0–52.0)
Hemoglobin: 11.6 g/dL — ABNORMAL LOW (ref 13.0–17.0)
MCH: 28.7 pg (ref 26.0–34.0)
MCHC: 33 g/dL (ref 30.0–36.0)
MCV: 87.1 fL (ref 78.0–100.0)
PLATELETS: 95 10*3/uL — AB (ref 150–400)
RBC: 4.04 MIL/uL — ABNORMAL LOW (ref 4.22–5.81)
RDW: 18 % — AB (ref 11.5–15.5)
WBC: 6.8 10*3/uL (ref 4.0–10.5)

## 2015-08-07 LAB — BASIC METABOLIC PANEL
ANION GAP: 18 — AB (ref 5–15)
BUN: 37 mg/dL — AB (ref 6–20)
CO2: 29 mmol/L (ref 22–32)
Calcium: 8.5 mg/dL — ABNORMAL LOW (ref 8.9–10.3)
Chloride: 95 mmol/L — ABNORMAL LOW (ref 101–111)
Creatinine, Ser: 5.4 mg/dL — ABNORMAL HIGH (ref 0.61–1.24)
GFR calc Af Amer: 10 mL/min — ABNORMAL LOW (ref >60)
GFR, EST NON AFRICAN AMERICAN: 9 mL/min — AB (ref >60)
GLUCOSE: 92 mg/dL (ref 65–99)
POTASSIUM: 4.4 mmol/L (ref 3.5–5.1)
Sodium: 142 mmol/L (ref 135–145)

## 2015-08-07 SURGERY — ICD/BIV ICD GENERATOR CHANGEOUT

## 2015-08-07 MED ORDER — MUPIROCIN 2 % EX OINT
TOPICAL_OINTMENT | CUTANEOUS | Status: DC
Start: 2015-08-07 — End: 2015-08-07
  Filled 2015-08-07: qty 22

## 2015-08-07 MED ORDER — ONDANSETRON HCL 4 MG/2ML IJ SOLN: 4.0000 mg | Freq: Four times a day (QID) | INTRAMUSCULAR | Status: DC | PRN

## 2015-08-07 MED ORDER — CEFAZOLIN SODIUM-DEXTROSE 2-3 GM-% IV SOLR
INTRAVENOUS | Status: AC
  Filled 2015-08-07: qty 50

## 2015-08-07 MED ORDER — FENTANYL CITRATE (PF) 100 MCG/2ML IJ SOLN
INTRAMUSCULAR | Status: AC
  Filled 2015-08-07: qty 4

## 2015-08-07 MED ORDER — LIDOCAINE HCL (PF) 1 % IJ SOLN
INTRAMUSCULAR | Status: AC
  Filled 2015-08-07: qty 30

## 2015-08-07 MED ORDER — MIDAZOLAM HCL 5 MG/5ML IJ SOLN
INTRAMUSCULAR | Status: DC | PRN
  Administered 2015-08-07: 1 mg via INTRAVENOUS

## 2015-08-07 MED ORDER — SODIUM CHLORIDE 0.9 % IR SOLN
80.0000 mg | Status: AC
  Administered 2015-08-07: 80 mg

## 2015-08-07 MED ORDER — FENTANYL CITRATE (PF) 100 MCG/2ML IJ SOLN
INTRAMUSCULAR | Status: DC | PRN
  Administered 2015-08-07: 12.5 ug via INTRAVENOUS

## 2015-08-07 MED ORDER — ACETAMINOPHEN 325 MG PO TABS
325.0000 mg | ORAL_TABLET | ORAL | Status: DC | PRN
  Filled 2015-08-07: qty 2

## 2015-08-07 MED ORDER — MIDAZOLAM HCL 5 MG/5ML IJ SOLN
INTRAMUSCULAR | Status: AC
  Filled 2015-08-07: qty 25

## 2015-08-07 MED ORDER — SODIUM CHLORIDE 0.9 % IV SOLN: INTRAVENOUS | Status: DC

## 2015-08-07 MED ORDER — CEFAZOLIN SODIUM-DEXTROSE 2-3 GM-% IV SOLR
2.0000 g | INTRAVENOUS | Status: AC
  Administered 2015-08-07: 2 g via INTRAVENOUS

## 2015-08-07 MED ORDER — LIDOCAINE HCL (PF) 1 % IJ SOLN
INTRAMUSCULAR | Status: DC | PRN
  Administered 2015-08-07: 35 mL via INTRADERMAL

## 2015-08-07 MED ORDER — SODIUM CHLORIDE 0.9 % IR SOLN
Status: AC
  Filled 2015-08-07: qty 2

## 2015-08-07 MED ORDER — SODIUM CHLORIDE 0.9 % IV SOLN
INTRAVENOUS | Status: DC | PRN
  Administered 2015-08-07: 50 mL/h via INTRAVENOUS

## 2015-08-07 SURGICAL SUPPLY — 4 items
CABLE SURGICAL S-101-97-12 (CABLE) ×2 IMPLANT
ICD VIVA XT CRT-D DTBA1D1 (ICD Generator) ×2 IMPLANT
PAD DEFIB LIFELINK (PAD) ×2 IMPLANT
TRAY PACEMAKER INSERTION (CUSTOM PROCEDURE TRAY) ×2 IMPLANT

## 2015-08-07 NOTE — Discharge Instructions (Signed)

## 2015-08-07 NOTE — Interval H&P Note (Signed)
History and Physical Interval Note:  08/07/2015 8:00 AM  [name]  has presented today for surgery, with the diagnosis of eri/chf/afib  The various methods of treatment have been discussed with the patient and family. After consideration of risks, benefits and other options for treatment, the patient has consented to  Procedure(s): ICD/BIV ICD Generator Changeout (N/A) as a surgical intervention .  The patient's history has been reviewed, patient examined, no change in status, stable for surgery.  I have reviewed the patient's chart and labs.  Questions were answered to the patient's satisfaction.     Mikle Bosworth.D.

## 2015-08-07 NOTE — Telephone Encounter (Signed)
Spoke with wife and let her know as long as he has not have a fever okay to proceed

## 2015-08-07 NOTE — H&P (Signed)
  ICD Criteria  Current LVEF:20%. Within 12 months prior to implant: Yes   Heart failure history: Yes, Class II  Cardiomyopathy history: Yes, Mixed Ischemic and Non-Ischemic Cardiomyopathies.  Atrial Fibrillation/Atrial Flutter: yes  Ventricular tachycardia history: No.  Cardiac arrest history: No.  History of syndromes with risk of sudden death: No.  Previous ICD: Yes, Reason for ICD:  Primary prevention.  Current ICD indication: Primary  PPM indication: Yes. Pacing type: Both. Greater than 40% RV pacing requirement anticipated. Indication: Sick Sinus Syndrome   Class I or II Bradycardia indication present: Yes  Beta Blocker therapy for 3 or more months: Yes, prescribed.   Ace Inhibitor/ARB therapy for 3 or more months: No, ESRD

## 2015-08-07 NOTE — H&P (View-Only) (Signed)
HPI [name] for followup. He is a very pleasant, 81 year old man with atrial fibrillation, chronic systolic heart failure, status post biventricular ICD implantation. In the interim, he has developed end-stage renal disease and is now on hemodialysis. Previously, he had atrial fibrillation which has been well-controlled on amiodarone therapy. He denies sodium indiscretion. He has had no ICD shock. His heart failure symptoms are class II.Marland Kitchen He denies bleeding. He denies chest pain. No syncope. He does not feel palpitations. He has reached ERI. No Known Allergies   Current Outpatient Prescriptions  Medication Sig Dispense Refill  . allopurinol (ZYLOPRIM) 100 MG tablet Take 100 mg by mouth daily.     Marland Kitchen amiodarone (PACERONE) 200 MG tablet Take 200 mg by mouth daily.     Marland Kitchen aspirin EC 81 MG EC tablet Take 1 tablet (81 mg total) by mouth daily.    Marland Kitchen atorvastatin (LIPITOR) 40 MG tablet Take 40 mg by mouth daily.      . Calcium Acetate, Phos Binder, (CALCIUM ACETATE PO) Take 2 capsules by mouth daily.    . carvedilol (COREG) 3.125 MG tablet Take 1 tablet (3.125 mg total) by mouth 2 (two) times daily with a meal. 60 tablet 1  . digoxin 62.5 MCG TABS Take 0.0625 mg by mouth daily. 30 tablet 3  . Multiple Vitamin (MULTIVITAMIN WITH MINERALS) TABS Take 1 tablet by mouth daily.    . SENSIPAR 30 MG tablet Take 30 mg by mouth daily.    Marland Kitchen warfarin (COUMADIN) 2 MG tablet Take 2-4 mg by mouth See admin instructions. Pt takes 2mg  on Sundays, pt takes 4mg  all other days of week     No current facility-administered medications for this visit.     Past Medical History  Diagnosis Date  . Dyslipidemia   . CHF (congestive heart failure), NYHA class II   . Atrial fibrillation   . Hypothyroidism   . Coronary artery disease     a. s/p PTCA/DES to LAD 2006  . Hypertension   . Ischemic cardiomyopathy     a. s/p MDT CRTD  . AICD (automatic cardioverter/defibrillator) present   . Myocardial  infarction   . Depression     hx  . History of blood transfusion 03/2015    LGIB  . Lower GI bleeding   . Arthritis     "everywhere"  . History of gout   . ESRD (end stage renal disease) on dialysis     "[place]; MWF" (07/10/2015)  . Prostate cancer     S/P seed implants    ROS:   All systems reviewed and negative except as noted in the HPI.   Past Surgical History  Procedure Laterality Date  . Medtronic biv icd  10/2006    Dr Lovena Le  . Total knee arthroplasty Right   . Av fistula placement  10/13/2012    Procedure: ARTERIOVENOUS (AV) FISTULA CREATION;  Surgeon: Mal Misty, MD;  Location: [place];  Service: Vascular;  Laterality: Right;  Creation right arm arteriovenous fistula   . Insertion of dialysis catheter  10/13/2012    Procedure: INSERTION OF DIALYSIS CATHETER;  Surgeon: Mal Misty, MD;  Location: [place];  Service: Vascular;  Laterality: N/A;  right internal jugular vein  . Ligation of competing branches of arteriovenous fistula  11/24/2012    Procedure: LIGATION OF COMPETING BRANCHES OF ARTERIOVENOUS FISTULA;  Surgeon: Rosetta Posner, MD;  Location: [place];  Service: Vascular;  Laterality: Right;  ligation of competing  branch right arteriovenous fistula  . Av fistula placement Left 01/08/2013    Procedure: ARTERIOVENOUS (AV) FISTULA CREATION;  Surgeon: Elam Dutch, MD;  Location: [place];  Service: Vascular;  Laterality: Left;  . Cardioversion N/A 07/21/2012    Procedure: CARDIOVERSION;  Surgeon: Evans Lance, MD;  Location: [facility] CATH LAB;  Service: Cardiovascular;  Laterality: N/A;  . Esophagogastroduodenoscopy Left 04/09/2015    Procedure: ESOPHAGOGASTRODUODENOSCOPY (EGD);  Surgeon: Laurence Spates, MD;  Location: [facility] ENDOSCOPY;  Service: Endoscopy;  Laterality: Left;  . Colonoscopy N/A 04/10/2015    Procedure: COLONOSCOPY;  Surgeon: Laurence Spates, MD;  Location: [place];  Service: Endoscopy;  Laterality: N/A;  . Joint replacement    . Biv icd genertaor  change out  X 1    "replaced the battery"  . Coronary angioplasty with stent placement  02/2005    PTCA and DES to LAD Dr Terrence Dupont  . Insertion prostate radiation seed       Family History  Problem Relation Age of Onset  . Diabetes Mother      Social History   Social History  . Marital Status: Married    Spouse Name: N/A  . Number of Children: N/A  . Years of Education: N/A   Occupational History  . retired    Social History Main Topics  . Smoking status: Former Smoker -- 10 years    Types: Cigarettes, Cigars  . Smokeless tobacco: Never Used     Comment: stopped smoking in 1970  . Alcohol Use: Yes     Comment: "stopped drinking alcohol in the 1980's"  . Drug Use: No  . Sexual Activity: Not on file   Other Topics Concern  . Not on file   Social History Narrative     BP 110/64 mmHg  Pulse 69  Ht 5\' 11"  (1.803 m)  Wt 172 lb 3.2 oz (78.109 kg)  BMI 24.03 kg/m2  Physical Exam:  well appearing 81 year old man, NAD HEENT: Unremarkable Neck:  6 cm JVD, no thyromegally Lungs:  Clear with no wheezes, rales, or rhonchi. HEART:  Regular rate rhythm, no murmurs, no rubs, no clicks Abd:  soft, positive bowel sounds, no organomegally, no rebound, no guarding Ext:  2 plus pulses, no edema, no cyanosis, no clubbing, left arm with a thrill in upper arm. Skin:  No rashes no nodules Neuro:  CN II through XII intact, motor grossly intact   DEVICE  Normal device function.  See PaceArt for details. Underlying rhythm is nsr  Assess/Plan:

## 2015-08-12 ENCOUNTER — Other Ambulatory Visit: Payer: Self-pay | Admitting: Nephrology

## 2015-08-12 ENCOUNTER — Ambulatory Visit
Admission: RE | Admit: 2015-08-12 | Discharge: 2015-08-12 | Disposition: A | Discharge status: Home / Self Care | Payer: Medicare Other | Source: Ambulatory Visit | Attending: Nephrology | Admitting: Nephrology

## 2015-08-12 DIAGNOSIS — R0602 Shortness of breath: Secondary | ICD-10-CM

## 2015-08-14 ENCOUNTER — Ambulatory Visit (INDEPENDENT_AMBULATORY_CARE_PROVIDER_SITE_OTHER): Payer: Medicare Other | Admitting: *Deleted

## 2015-08-14 ENCOUNTER — Encounter: Payer: Self-pay | Admitting: Internal Medicine

## 2015-08-14 DIAGNOSIS — I255 Ischemic cardiomyopathy: Secondary | ICD-10-CM

## 2015-08-14 LAB — CUP PACEART INCLINIC DEVICE CHECK
Brady Statistic AP VS Percent: 0.01 %
Brady Statistic AS VP Percent: 0.01 %
Brady Statistic AS VS Percent: 0 %
Brady Statistic RV Percent Paced: 99.99 %
HIGH POWER IMPEDANCE MEASURED VALUE: 30 Ohm
HIGH POWER IMPEDANCE MEASURED VALUE: 40 Ohm
Implantable Lead Implant Date: 20080114
Implantable Lead Implant Date: 20080114
Implantable Lead Location: 753858
Implantable Lead Location: 753860
Implantable Lead Model: 5076
Lead Channel Impedance Value: 190 Ohm
Lead Channel Impedance Value: 285 Ohm
Lead Channel Impedance Value: 342 Ohm
Lead Channel Pacing Threshold Amplitude: 1 V
Lead Channel Pacing Threshold Pulse Width: 0.4 ms
Lead Channel Pacing Threshold Pulse Width: 0.4 ms
Lead Channel Sensing Intrinsic Amplitude: 6.625 mV
Lead Channel Setting Pacing Amplitude: 2 V
Lead Channel Setting Pacing Pulse Width: 0.4 ms
Lead Channel Setting Sensing Sensitivity: 0.3 mV
MDC IDC LEAD IMPLANT DT: 20080114
MDC IDC LEAD LOCATION: 753859
MDC IDC LEAD MODEL: 4194
MDC IDC MSMT BATTERY REMAINING LONGEVITY: 83 mo
MDC IDC MSMT BATTERY VOLTAGE: 3.12 V
MDC IDC MSMT LEADCHNL LV IMPEDANCE VALUE: 475 Ohm
MDC IDC MSMT LEADCHNL LV PACING THRESHOLD AMPLITUDE: 0.75 V
MDC IDC MSMT LEADCHNL RA IMPEDANCE VALUE: 418 Ohm
MDC IDC MSMT LEADCHNL RA SENSING INTR AMPL: 1.125 mV
MDC IDC MSMT LEADCHNL RV IMPEDANCE VALUE: 361 Ohm
MDC IDC MSMT LEADCHNL RV PACING THRESHOLD AMPLITUDE: 0.75 V
MDC IDC MSMT LEADCHNL RV PACING THRESHOLD PULSEWIDTH: 0.4 ms
MDC IDC SESS DTM: 20161020121948
MDC IDC SET LEADCHNL LV PACING AMPLITUDE: 2.25 V
MDC IDC SET LEADCHNL LV PACING PULSEWIDTH: 0.4 ms
MDC IDC SET LEADCHNL RV PACING AMPLITUDE: 2.5 V
MDC IDC STAT BRADY AP VP PERCENT: 99.98 %
MDC IDC STAT BRADY RA PERCENT PACED: 99.99 %

## 2015-08-14 NOTE — Progress Notes (Signed)
Wound check appointment. Steri-strips removed. Wound without redness or edema. Incision edges approximated, wound well healed. Normal device function. Thresholds, sensing, and impedances consistent with implant measurements. Device programmed at appropriate safety margins. Histogram distribution appropriate for patient and level of activity. No mode switches or ventricular arrhythmias noted. Patient educated about wound care, arm mobility, lifting restrictions, shock plan. ROV in 3 months with GT.

## 2015-08-28 ENCOUNTER — Other Ambulatory Visit: Payer: Self-pay | Admitting: Cardiology

## 2015-08-28 DIAGNOSIS — R0789 Other chest pain: Secondary | ICD-10-CM

## 2015-08-28 DIAGNOSIS — R071 Chest pain on breathing: Secondary | ICD-10-CM

## 2015-08-29 ENCOUNTER — Encounter (HOSPITAL_COMMUNITY): Payer: Self-pay | Admitting: Emergency Medicine

## 2015-08-29 ENCOUNTER — Emergency Department (HOSPITAL_COMMUNITY): Payer: Medicare Other

## 2015-08-29 ENCOUNTER — Other Ambulatory Visit: Payer: Self-pay

## 2015-08-29 ENCOUNTER — Emergency Department (HOSPITAL_COMMUNITY)
Admission: EM | Admit: 2015-08-29 | Discharge: 2015-08-29 | Disposition: A | Discharge status: Home / Self Care | Payer: Medicare Other | Attending: Emergency Medicine | Admitting: Emergency Medicine

## 2015-08-29 DIAGNOSIS — E785 Hyperlipidemia, unspecified: Secondary | ICD-10-CM | POA: Diagnosis not present

## 2015-08-29 DIAGNOSIS — Z8546 Personal history of malignant neoplasm of prostate: Secondary | ICD-10-CM | POA: Diagnosis not present

## 2015-08-29 DIAGNOSIS — E039 Hypothyroidism, unspecified: Secondary | ICD-10-CM | POA: Diagnosis not present

## 2015-08-29 DIAGNOSIS — Z992 Dependence on renal dialysis: Secondary | ICD-10-CM | POA: Diagnosis not present

## 2015-08-29 DIAGNOSIS — Z79899 Other long term (current) drug therapy: Secondary | ICD-10-CM | POA: Diagnosis not present

## 2015-08-29 DIAGNOSIS — R042 Hemoptysis: Secondary | ICD-10-CM

## 2015-08-29 DIAGNOSIS — Z7982 Long term (current) use of aspirin: Secondary | ICD-10-CM | POA: Diagnosis not present

## 2015-08-29 DIAGNOSIS — I252 Old myocardial infarction: Secondary | ICD-10-CM | POA: Insufficient documentation

## 2015-08-29 DIAGNOSIS — I120 Hypertensive chronic kidney disease with stage 5 chronic kidney disease or end stage renal disease: Secondary | ICD-10-CM | POA: Diagnosis not present

## 2015-08-29 DIAGNOSIS — I2510 Atherosclerotic heart disease of native coronary artery without angina pectoris: Secondary | ICD-10-CM | POA: Insufficient documentation

## 2015-08-29 DIAGNOSIS — I509 Heart failure, unspecified: Secondary | ICD-10-CM | POA: Insufficient documentation

## 2015-08-29 DIAGNOSIS — N186 End stage renal disease: Secondary | ICD-10-CM | POA: Diagnosis not present

## 2015-08-29 DIAGNOSIS — Z87891 Personal history of nicotine dependence: Secondary | ICD-10-CM | POA: Diagnosis not present

## 2015-08-29 LAB — CBC WITH DIFFERENTIAL/PLATELET
Basophils Absolute: 0.1 10*3/uL (ref 0.0–0.1)
Basophils Relative: 1 %
Eosinophils Absolute: 0.4 10*3/uL (ref 0.0–0.7)
Eosinophils Relative: 8 %
HCT: 31.7 % — ABNORMAL LOW (ref 39.0–52.0)
Hemoglobin: 10.5 g/dL — ABNORMAL LOW (ref 13.0–17.0)
Lymphocytes Relative: 17 %
Lymphs Abs: 0.9 10*3/uL (ref 0.7–4.0)
MCH: 30.2 pg (ref 26.0–34.0)
MCHC: 33.1 g/dL (ref 30.0–36.0)
MCV: 91.1 fL (ref 78.0–100.0)
Monocytes Absolute: 1 10*3/uL (ref 0.1–1.0)
Monocytes Relative: 19 %
Neutro Abs: 2.7 10*3/uL (ref 1.7–7.7)
Neutrophils Relative %: 55 %
Platelets: 102 10*3/uL — ABNORMAL LOW (ref 150–400)
RBC: 3.48 MIL/uL — ABNORMAL LOW (ref 4.22–5.81)
RDW: 21.1 % — ABNORMAL HIGH (ref 11.5–15.5)
WBC: 5.1 10*3/uL (ref 4.0–10.5)

## 2015-08-29 LAB — I-STAT CHEM 8, ED
BUN: 34 mg/dL — ABNORMAL HIGH (ref 6–20)
CREATININE: 6.6 mg/dL — AB (ref 0.61–1.24)
Calcium, Ion: 1.01 mmol/L — ABNORMAL LOW (ref 1.13–1.30)
Chloride: 100 mmol/L — ABNORMAL LOW (ref 101–111)
GLUCOSE: 85 mg/dL (ref 65–99)
HCT: 37 % — ABNORMAL LOW (ref 39.0–52.0)
HEMOGLOBIN: 12.6 g/dL — AB (ref 13.0–17.0)
POTASSIUM: 4 mmol/L (ref 3.5–5.1)
Sodium: 141 mmol/L (ref 135–145)
TCO2: 26 mmol/L (ref 0–100)

## 2015-08-29 LAB — BASIC METABOLIC PANEL
Anion gap: 17 — ABNORMAL HIGH (ref 5–15)
BUN: 35 mg/dL — ABNORMAL HIGH (ref 6–20)
CALCIUM: 9 mg/dL (ref 8.9–10.3)
CO2: 27 mmol/L (ref 22–32)
CREATININE: 6.82 mg/dL — AB (ref 0.61–1.24)
Chloride: 97 mmol/L — ABNORMAL LOW (ref 101–111)
GFR, EST AFRICAN AMERICAN: 8 mL/min — AB (ref >60)
GFR, EST NON AFRICAN AMERICAN: 7 mL/min — AB (ref >60)
Glucose, Bld: 84 mg/dL (ref 65–99)
Potassium: 4 mmol/L (ref 3.5–5.1)
SODIUM: 141 mmol/L (ref 135–145)

## 2015-08-29 LAB — I-STAT TROPONIN, ED: TROPONIN I, POC: 0.15 ng/mL — AB (ref 0.00–0.08)

## 2015-08-29 LAB — PROTIME-INR
INR: 3.33 — AB (ref 0.00–1.49)
PROTHROMBIN TIME: 33.1 s — AB (ref 11.6–15.2)

## 2015-08-29 LAB — TROPONIN I: Troponin I: 0.24 ng/mL — ABNORMAL HIGH (ref <0.031)

## 2015-08-29 MED ORDER — SODIUM CHLORIDE 0.9 % IV BOLUS (SEPSIS)
1000.0000 mL | Freq: Once | INTRAVENOUS | Status: AC
  Administered 2015-08-29: 1000 mL via INTRAVENOUS

## 2015-08-29 NOTE — ED Notes (Signed)
Able to feel the thrill and hear the bruit of his fistula in the left arm.

## 2015-08-29 NOTE — ED Provider Notes (Signed)
CSN: [id number]     Arrival date & time 08/29/15  7989 History   First MD Initiated Contact with Patient 08/29/15 0630     Chief complaint: Hemoptysis  HPI   DAWAUN BRANCATO is an 81 yo M PMH a-fib, ESRD (HD MWF), AICD, prostate CA pw hemoptysis. He describes the hemoptysis as BRB, streaks, and he has had three episodes. He endorses possible medication error, left arm swelling, diaphoresis, cough since this morning. No fevers, CP, SOB, palpitations, extremity pain, abdominal pain, N/V/D.   He is concerned regarding medication error- he thinks he took two pills of a medication he normally takes (unsure of what medication this is).  He states he has had this left arm swelling before, but he is unsure of what he was diagnosed with or how they treated it.   Past Medical History  Diagnosis Date  . Dyslipidemia   . CHF (congestive heart failure), NYHA class II ([place])   . Atrial fibrillation ([place])   . Hypothyroidism   . Coronary artery disease     a. s/p PTCA/DES to LAD 2006  . Hypertension   . Ischemic cardiomyopathy     a. s/p MDT CRTD  . AICD (automatic cardioverter/defibrillator) present   . Myocardial infarction ([place])   . Depression     hx  . History of blood transfusion 03/2015    LGIB  . Lower GI bleeding   . Arthritis     "everywhere"  . History of gout   . ESRD (end stage renal disease) on dialysis [facility])     "[place]; MWF" (07/10/2015)  . Prostate cancer ([place])     S/P seed implants   Past Surgical History  Procedure Laterality Date  . Medtronic biv icd  10/2006    Dr Lovena Le  . Total knee arthroplasty Right   . Av fistula placement  10/13/2012    Procedure: ARTERIOVENOUS (AV) FISTULA CREATION;  Surgeon: Mal Misty, MD;  Location: [place];  Service: Vascular;  Laterality: Right;  Creation right arm arteriovenous fistula   . Insertion of dialysis catheter  10/13/2012    Procedure: INSERTION OF DIALYSIS CATHETER;  Surgeon: Mal Misty, MD;  Location: [place];   Service: Vascular;  Laterality: N/A;  right internal jugular vein  . Ligation of competing branches of arteriovenous fistula  11/24/2012    Procedure: LIGATION OF COMPETING BRANCHES OF ARTERIOVENOUS FISTULA;  Surgeon: Rosetta Posner, MD;  [facility];  Service: Vascular;  Laterality: Right;  ligation of competing branch right arteriovenous fistula  . Av fistula placement Left 01/08/2013    Procedure: ARTERIOVENOUS (AV) FISTULA CREATION;  Surgeon: Elam Dutch, MD;  Location: [place];  Service: Vascular;  Laterality: Left;  . Cardioversion N/A 07/21/2012    Procedure: CARDIOVERSION;  Surgeon: Evans Lance, MD;  Location: [facility] CATH LAB;  Service: Cardiovascular;  Laterality: N/A;  . Esophagogastroduodenoscopy Left 04/09/2015    Procedure: ESOPHAGOGASTRODUODENOSCOPY (EGD);  Surgeon: Laurence Spates, MD;  Location: [facility] ENDOSCOPY;  Service: Endoscopy;  Laterality: Left;  . Colonoscopy N/A 04/10/2015    Procedure: COLONOSCOPY;  Surgeon: Laurence Spates, MD;  Location: [place];  Service: Endoscopy;  Laterality: N/A;  . Joint replacement    . Biv icd genertaor change out  X 1    "replaced the battery"  . Coronary angioplasty with stent placement  02/2005    PTCA and DES to LAD Dr Terrence Dupont  . Insertion prostate radiation seed    . Ep implantable device  N/A 08/07/2015    Procedure: ICD/BIV ICD Generator Changeout;  Surgeon: Evans Lance, MD;  Location: [place] CV LAB;  Service: Cardiovascular;  Laterality: N/A;   Family History  Problem Relation Age of Onset  . Diabetes Mother    Social History  Substance Use Topics  . Smoking status: Former Smoker -- 10 years    Types: Cigarettes, Cigars  . Smokeless tobacco: Never Used     Comment: stopped smoking in 1970  . Alcohol Use: Yes     Comment: "stopped drinking alcohol in the 1980's"    Review of Systems  Ten systems are reviewed and are negative for acute change except as noted in the HPI    Allergies  Review of patient's allergies  indicates no known allergies.  Home Medications   Prior to Admission medications   Medication Sig Start Date End Date Taking? Authorizing Provider  allopurinol (ZYLOPRIM) 100 MG tablet Take 100 mg by mouth daily.  06/12/12  Yes Historical Provider, MD  amiodarone (PACERONE) 200 MG tablet Take 200 mg by mouth daily.    Yes Historical Provider, MD  aspirin EC 81 MG EC tablet Take 1 tablet (81 mg total) by mouth daily. 07/11/15  Yes Dixie Dials, MD  atorvastatin (LIPITOR) 40 MG tablet Take 40 mg by mouth daily.     Yes Historical Provider, MD  carvedilol (COREG) 3.125 MG tablet Take 1 tablet (3.125 mg total) by mouth 2 (two) times daily with a meal. 07/11/15  Yes Dixie Dials, MD  digoxin 62.5 MCG TABS Take 0.0625 mg by mouth daily. 07/11/15  Yes Dixie Dials, MD  mirtazapine (REMERON) 15 MG tablet Take 15 mg by mouth at bedtime.   Yes Historical Provider, MD  Multiple Vitamin (MULTIVITAMIN WITH MINERALS) TABS Take 1 tablet by mouth daily. 63+   Yes Historical Provider, MD  warfarin (COUMADIN) 2 MG tablet Take 2-4 mg by mouth See admin instructions. Pt takes 2mg  on Sundays, pt takes 4mg  all other days of week   Yes Historical Provider, MD   BP 139/69 mmHg  Pulse 70  Temp(Src) 98.5 F (36.9 C)  Resp 19  Ht 5\' 11"  (1.803 m)  Wt 170 lb (77.111 kg)  BMI 23.72 kg/m2  SpO2 100% Physical Exam  Constitutional: He appears well-developed and well-nourished. No distress.  HENT:  Head: Normocephalic and atraumatic.  Mouth/Throat: Oropharynx is clear and moist. No oropharyngeal exudate.  Eyes: Conjunctivae are normal. Right eye exhibits no discharge. Left eye exhibits no discharge. No scleral icterus.  Neck: No tracheal deviation present.  Cardiovascular: Normal rate, regular rhythm, normal heart sounds and intact distal pulses.  Exam reveals no gallop and no friction rub.   No murmur heard. Pulmonary/Chest: Effort normal. No respiratory distress. He has no wheezes. He has rales. He exhibits no  tenderness.  Rales lower lobes bilaterally  Abdominal: Soft. Bowel sounds are normal. He exhibits no distension and no mass. There is no tenderness. There is no rebound and no guarding.  Musculoskeletal: Normal range of motion. He exhibits edema. He exhibits no tenderness.  LUE edema. Full ROM, neurovascularly intact bilaterally. AV fistula LUE (palpable thrill) without erythema, tenderness, purulent drainage.   Lymphadenopathy:    He has no cervical adenopathy.  Neurological: He is alert. Coordination normal.  Skin: Skin is warm and dry. No rash noted. He is not diaphoretic. No erythema.  Psychiatric: He has a normal mood and affect. His behavior is normal.  Nursing note and vitals reviewed.   ED  Course  Procedures  Labs Review Labs Reviewed  CBC WITH DIFFERENTIAL/PLATELET - Abnormal; Notable for the following:    RBC 3.48 (*)    Hemoglobin 10.5 (*)    HCT 31.7 (*)    RDW 21.1 (*)    Platelets 102 (*)    All other components within normal limits  BASIC METABOLIC PANEL - Abnormal; Notable for the following:    Chloride 97 (*)    BUN 35 (*)    Creatinine, Ser 6.82 (*)    GFR calc non Af Amer 7 (*)    GFR calc Af Amer 8 (*)    Anion gap 17 (*)    All other components within normal limits  PROTIME-INR - Abnormal; Notable for the following:    Prothrombin Time 33.1 (*)    INR 3.33 (*)    All other components within normal limits  TROPONIN I - Abnormal; Notable for the following:    Troponin I 0.24 (*)    All other components within normal limits  I-STAT CHEM 8, ED - Abnormal; Notable for the following:    Chloride 100 (*)    BUN 34 (*)    Creatinine, Ser 6.60 (*)    Calcium, Ion 1.01 (*)    Hemoglobin 12.6 (*)    HCT 37.0 (*)    All other components within normal limits  I-STAT TROPOININ, ED - Abnormal; Notable for the following:    Troponin i, poc 0.15 (*)    All other components within normal limits    Imaging Review Dg Chest 2 View  08/29/2015  CLINICAL DATA:   Coarse lung sounds, rhonchi by exam EXAM: CHEST  2 VIEW COMPARISON:  08/12/2015 FINDINGS: Marked cardiomegaly as before with an AICD/pacer evident. Slight increased central vascularity and diffuse interstitial prominence compared to 08/12/2015. Early edema suspected. Chronic elevation of the left hemidiaphragm. No significant effusion or pneumothorax. Trachea midline. Atherosclerosis of the aorta. IMPRESSION: Cardiomegaly with slight increased interstitial pattern suspicious for early developing edema. Electronically Signed   By: Jerilynn Mages.  Shick M.D.   On: 08/29/2015 08:53   I have personally reviewed and evaluated these images and lab results as part of my medical decision-making.   EKG Interpretation None      MDM   Final diagnoses:  Cough with hemoptysis   Patient with diaphoresis, left arm edema and hemoptysis. No pain. History is limited. According to wife, patient has been confused for "a while." Afebrile, normal rate, non-toxic appearing. Rales on exam. AV fistula without obvious signs of infection. Patient does not make urine.  Will check INR to see if patient is therapeutic on Coumadin, since he is unsure if he took his Coumadin last night. If therapeutic, DVT/PE less likely. If not, will CTA chest.   iStat troponin 0.15. Lab error vs. MI vs. PE vs. nonspecific elevation.  INR supratherapeutic at 3.3. DVT/PE less likely.   Dr. Venora Maples saw patient. Hemoptysis likely due to supratherapeutic coumadin levels. Advised hold coumadin today. Resume tomorrow. Follow-up with PCP. Patient can be safely discharged home.     [place] Lions, PA-C 08/29/15 [place], MD 08/29/15 [phone]

## 2015-08-29 NOTE — ED Notes (Signed)
Patient transported to X-ray 

## 2015-08-29 NOTE — Discharge Instructions (Signed)
[name], [name] meeting you! Please do not take your coumadin tonight. You may resume taking your coumadin tomorrow. Please follow-up with your primary care provider within one week. Please seek medical attention if you develop fever, chills, chest pain, shortness of breath. I hope you feel better soon!  S. Wendie Simmer, PA-C  Hemoptysis Hemoptysis, which means coughing up blood, can be a sign of a minor problem or a serious medical condition. The blood that is coughed up may come from the lungs and airways. Coughed-up blood can also come from bleeding that occurs outside the lungs and airways. Blood can drain into the windpipe during a severe nosebleed or when blood is vomited from the stomach. Because hemoptysis can be a sign of something serious, a medical evaluation is required. For some people with hemoptysis, no definite cause is ever identified. CAUSES  The most common cause of hemoptysis is bronchitis. Some other common causes include:   A ruptured blood vessel caused by coughing or an infection.   A medical condition that causes damage to the large air passageways (bronchiectasis).   A blood clot in the lungs (pulmonary embolism).   Pneumonia.   Tuberculosis.   Breathing in a small foreign object.   Cancer. For some people with hemoptysis, no definite cause is ever identified.  HOME CARE INSTRUCTIONS  Only take over-the-counter or prescription medicines as directed by your caregiver. Do not use cough suppressants unless your caregiver approves.  If your caregiver prescribes antibiotic medicines, take them as directed. Finish them even if you start to feel better.  Do not smoke. Also avoid secondhand smoke.  Follow up with your caregiver as directed. SEEK IMMEDIATE MEDICAL CARE IF:   You cough up bloody mucus for longer than a week.  You have a blood-producing cough that is severe or getting worse.  You have a blood-producing cough thatcomes and goes over  time.  You develop problems with your breathing.   You vomit blood.  You develop bloody or black-colored stools.  You have chest pain.   You develop night sweats.  You feel faint or pass out.   You have a fever or persistent symptoms for more than 2-3 days.  You have a fever and your symptoms suddenly get worse. MAKE SURE YOU:  Understand these instructions.  Will watch your condition.  Will get help right away if you are not doing well or get worse.   This information is not intended to replace advice given to you by your health care provider. Make sure you discuss any questions you have with your health care provider.   Document Released: 12/20/2001 Document Revised: 09/27/2012 Document Reviewed: 07/28/2012 Elsevier Interactive Patient Education [employer].

## 2015-08-29 NOTE — ED Notes (Signed)
Pt reports waking up this am and he was sweating. When pt coughed this morning he noticed some red streaks in his mucus. Pt states this happened approximately 4 times. Pt also noticed significant swelling to his left hand.  Pt still able to use his hand. Pt a febrile.   Pt has a defibrillator, believes that it is a Medtronic. Fistula in his left arm.   Alert and oriented.

## 2015-09-04 ENCOUNTER — Encounter (HOSPITAL_COMMUNITY)
Admission: RE | Admit: 2015-09-04 | Discharge: 2015-09-04 | Disposition: A | Discharge status: Home / Self Care | Payer: Medicare Other | Source: Ambulatory Visit | Attending: Cardiology | Admitting: Cardiology

## 2015-09-04 DIAGNOSIS — R0789 Other chest pain: Secondary | ICD-10-CM | POA: Insufficient documentation

## 2015-09-04 MED ORDER — REGADENOSON 0.4 MG/5ML IV SOLN
0.4000 mg | Freq: Once | INTRAVENOUS | Status: AC
  Administered 2015-09-04: 0.4 mg via INTRAVENOUS

## 2015-09-04 MED ORDER — TECHNETIUM TC 99M SESTAMIBI GENERIC - CARDIOLITE
30.0000 | Freq: Once | INTRAVENOUS | Status: AC | PRN
  Administered 2015-09-04: 30 via INTRAVENOUS

## 2015-09-04 MED ORDER — TECHNETIUM TC 99M SESTAMIBI GENERIC - CARDIOLITE
10.0000 | Freq: Once | INTRAVENOUS | Status: AC | PRN
  Administered 2015-09-04: 10 via INTRAVENOUS

## 2015-11-11 ENCOUNTER — Encounter: Payer: Self-pay | Admitting: Internal Medicine

## 2015-11-11 ENCOUNTER — Ambulatory Visit (INDEPENDENT_AMBULATORY_CARE_PROVIDER_SITE_OTHER): Payer: Medicare Other | Admitting: Internal Medicine

## 2015-11-11 VITALS — BP 120/60 | HR 87 | Ht 71.0 in | Wt 171.6 lb

## 2015-11-11 DIAGNOSIS — I480 Paroxysmal atrial fibrillation: Secondary | ICD-10-CM | POA: Diagnosis not present

## 2015-11-11 DIAGNOSIS — I255 Ischemic cardiomyopathy: Secondary | ICD-10-CM

## 2015-11-11 DIAGNOSIS — Z95810 Presence of automatic (implantable) cardiac defibrillator: Secondary | ICD-10-CM

## 2015-11-11 DIAGNOSIS — I5022 Chronic systolic (congestive) heart failure: Secondary | ICD-10-CM | POA: Diagnosis not present

## 2015-11-11 DIAGNOSIS — R000 Tachycardia, unspecified: Secondary | ICD-10-CM

## 2015-11-11 DIAGNOSIS — I472 Ventricular tachycardia: Secondary | ICD-10-CM

## 2015-11-11 NOTE — Patient Instructions (Signed)
Medication Instructions:  Your physician recommends that you continue on your current medications as directed. Please refer to the Current Medication list given to you today.   Labwork: None ordered   Testing/Procedures: None ordered   Follow-Up: Your physician wants you to follow-up in: 9 months with Dr Knox Saliva will receive a reminder letter in the mail two months in advance. If you don't receive a letter, please call our office to schedule the follow-up appointment.   Remote monitoring is used to monitor your ICD from home. This monitoring reduces the number of office visits required to check your device to one time per year. It allows [place] to keep an eye on the functioning of your device to ensure it is working properly. You are scheduled for a device check from home on 02/10/16. You may send your transmission at any time that day. If you have a wireless device, the transmission will be sent automatically. After your physician reviews your transmission, you will receive a postcard with your next transmission date.    Any Other Special Instructions Will Be Listed Below (If Applicable).     If you need a refill on your cardiac medications before your next appointment, please call your pharmacy.

## 2015-11-11 NOTE — Assessment & Plan Note (Signed)
His medtronic BiV ICD is working normally. He has underlying heart block today.

## 2015-11-11 NOTE — Assessment & Plan Note (Signed)
He is maintaining NSR very nicely. Will follow.  

## 2015-11-11 NOTE — Progress Notes (Signed)
HPI [name] for followup. He is a very pleasant, 82 year old man with atrial fibrillation, chronic systolic heart failure, status post biventricular ICD implantation. In the interim, he has developed end-stage renal disease and is now on hemodialysis. Previously, he had atrial fibrillation which has been well-controlled on amiodarone therapy. He denies sodium indiscretion. He has had no ICD shock. His heart failure symptoms are class II.Marland Kitchen He denies bleeding. He denies chest pain. No syncope. He does not feel palpitations. He has undergone upgrade to a BiV ICD after reaching ERI several months ago. He has had mild peripheral edema.  No Known Allergies   Current Outpatient Prescriptions  Medication Sig Dispense Refill  . allopurinol (ZYLOPRIM) 100 MG tablet Take 100 mg by mouth daily.     Marland Kitchen aspirin EC 81 MG EC tablet Take 1 tablet (81 mg total) by mouth daily.    Marland Kitchen atorvastatin (LIPITOR) 40 MG tablet Take 40 mg by mouth daily.      . midodrine (PROAMATINE) 10 MG tablet TAKE 1 TABLET 3 TIMES A WEEK PRIOR TO DIALYSIS, AND 1 TABLET AS NEEDED  5  . mirtazapine (REMERON) 15 MG tablet Take 15 mg by mouth at bedtime.    . Multiple Vitamin (MULTIVITAMIN WITH MINERALS) TABS Take 1 tablet by mouth daily. 50+    . warfarin (COUMADIN) 3 MG tablet Take as directed by the Coumadin clinic  3  . amiodarone (PACERONE) 200 MG tablet Take 200 mg by mouth daily.     . carvedilol (COREG) 3.125 MG tablet Take 1 tablet (3.125 mg total) by mouth 2 (two) times daily with a meal. (Patient not taking: Reported on 11/11/2015) 60 tablet 1   No current facility-administered medications for this visit.     Past Medical History  Diagnosis Date  . Dyslipidemia   . CHF (congestive heart failure), NYHA class II ([place])   . Atrial fibrillation ([place])   . Hypothyroidism   . Coronary artery disease     a. s/p PTCA/DES to LAD 2006  . Hypertension   . Ischemic cardiomyopathy     a. s/p MDT CRTD  . AICD (automatic  cardioverter/defibrillator) present   . Myocardial infarction ([place])   . Depression     hx  . History of blood transfusion 03/2015    LGIB  . Lower GI bleeding   . Arthritis     "everywhere"  . History of gout   . ESRD (end stage renal disease) on dialysis [facility])     "[place]; MWF" (07/10/2015)  . Prostate cancer ([place])     S/P seed implants    ROS:   All systems reviewed and negative except as noted in the HPI.   Past Surgical History  Procedure Laterality Date  . Medtronic biv icd  10/2006    Dr Lovena Le  . Total knee arthroplasty Right   . Av fistula placement  10/13/2012    Procedure: ARTERIOVENOUS (AV) FISTULA CREATION;  Surgeon: Mal Misty, MD;  Location: [place];  Service: Vascular;  Laterality: Right;  Creation right arm arteriovenous fistula   . Insertion of dialysis catheter  10/13/2012    Procedure: INSERTION OF DIALYSIS CATHETER;  Surgeon: Mal Misty, MD;  Location: [place];  Service: Vascular;  Laterality: N/A;  right internal jugular vein  . Ligation of competing branches of arteriovenous fistula  11/24/2012    Procedure: LIGATION OF COMPETING BRANCHES OF ARTERIOVENOUS FISTULA;  Surgeon: Rosetta Posner, MD;  Location: [place];  Service: Vascular;  Laterality: Right;  ligation of competing branch right arteriovenous fistula  . Av fistula placement Left 01/08/2013    Procedure: ARTERIOVENOUS (AV) FISTULA CREATION;  Surgeon: Elam Dutch, MD;  Location: [place];  Service: Vascular;  Laterality: Left;  . Cardioversion N/A 07/21/2012    Procedure: CARDIOVERSION;  Surgeon: Evans Lance, MD;  Location: [place] CATH LAB;  Service: Cardiovascular;  Laterality: N/A;  . Esophagogastroduodenoscopy Left 04/09/2015    Procedure: ESOPHAGOGASTRODUODENOSCOPY (EGD);  Surgeon: Laurence Spates, MD;  Location: [facility] ENDOSCOPY;  Service: Endoscopy;  Laterality: Left;  . Colonoscopy N/A 04/10/2015    Procedure: COLONOSCOPY;  Surgeon: Laurence Spates, MD;  Location: [place];  Service: Endoscopy;   Laterality: N/A;  . Joint replacement    . Biv icd genertaor change out  X 1    "replaced the battery"  . Coronary angioplasty with stent placement  02/2005    PTCA and DES to LAD Dr Terrence Dupont  . Insertion prostate radiation seed    . Ep implantable device N/A 08/07/2015    Procedure: ICD/BIV ICD Generator Changeout;  Surgeon: Evans Lance, MD;  Location: [place] CV LAB;  Service: Cardiovascular;  Laterality: N/A;     Family History  Problem Relation Age of Onset  . Diabetes Mother      Social History   Social History  . Marital Status: Married    Spouse Name: N/A  . Number of Children: N/A  . Years of Education: N/A   Occupational History  . retired    Social History Main Topics  . Smoking status: Former Smoker -- 10 years    Types: Cigarettes, Cigars  . Smokeless tobacco: Never Used     Comment: stopped smoking in 1970  . Alcohol Use: Yes     Comment: "stopped drinking alcohol in the 1980's"  . Drug Use: No  . Sexual Activity: Not on file   Other Topics Concern  . Not on file   Social History Narrative     BP 120/60 mmHg  Pulse 87  Ht 5\' 11"  (1.803 m)  Wt 171 lb 9.6 oz (77.837 kg)  BMI 23.94 kg/m2  Physical Exam:  well appearing 82 year old man, NAD HEENT: Unremarkable Neck:  6 cm JVD, no thyromegally Lungs:  Clear with no wheezes, rales, or rhonchi. HEART:  Regular rate rhythm, no murmurs, no rubs, no clicks Abd:  soft, positive bowel sounds, no organomegally, no rebound, no guarding Ext:  2 plus pulses, 2+ peripheral edema, no cyanosis, no clubbing, left arm with a thrill in upper arm. Skin:  No rashes no nodules Neuro:  CN II through XII intact, motor grossly intact   DEVICE  Normal device function.  See PaceArt for details. Underlying rhythm is nsr  Assess/Plan:

## 2015-11-11 NOTE — Assessment & Plan Note (Signed)
He will remain on amiodarone.

## 2015-11-11 NOTE — Assessment & Plan Note (Signed)
He has been encouraged to reduce his sodium intake. He will continue his current meds.

## 2015-11-12 ENCOUNTER — Telehealth: Payer: Self-pay | Admitting: Internal Medicine

## 2015-11-12 NOTE — Telephone Encounter (Signed)
New problem   These are the medication that the pt forgot to give you that he takes: Duloxetine 30mg  takes 2x daily Digoxin 125mg    Takes 1/2 pill a day   Pt also want to speak to nurse concerning the medication the doc was suppose to have given him for the swelling of his legs. Please call pt.

## 2015-11-12 NOTE — Telephone Encounter (Signed)
Spoke with patient and let him know we updated his medication list.  I also encouraged him to watch the salt in his diet as Dr Lovena Le instructed.  If he does not notice a change in his swelling after to call and let me know.  I will  discuss with Dr Lovena Le.  He verbalized understanding.

## 2015-11-14 LAB — CUP PACEART INCLINIC DEVICE CHECK
Battery Remaining Longevity: 80 mo
Brady Statistic RA Percent Paced: 99.99 %
Brady Statistic RV Percent Paced: 99.91 %
HighPow Impedance: 33 Ohm
HighPow Impedance: 42 Ohm
Implantable Lead Implant Date: 20080114
Implantable Lead Location: 753859
Implantable Lead Location: 753860
Implantable Lead Model: 6947
Lead Channel Impedance Value: 228 Ohm
Lead Channel Impedance Value: 418 Ohm
Lead Channel Pacing Threshold Amplitude: 0.5 V
Lead Channel Pacing Threshold Amplitude: 0.875 V
Lead Channel Sensing Intrinsic Amplitude: 6.625 mV
Lead Channel Setting Pacing Amplitude: 2.25 V
Lead Channel Setting Pacing Amplitude: 2.5 V
Lead Channel Setting Pacing Pulse Width: 0.4 ms
MDC IDC LEAD IMPLANT DT: 20080114
MDC IDC LEAD IMPLANT DT: 20080114
MDC IDC LEAD LOCATION: 753858
MDC IDC LEAD MODEL: 4194
MDC IDC MSMT BATTERY VOLTAGE: 3.05 V
MDC IDC MSMT LEADCHNL LV IMPEDANCE VALUE: 399 Ohm
MDC IDC MSMT LEADCHNL LV IMPEDANCE VALUE: 513 Ohm
MDC IDC MSMT LEADCHNL LV PACING THRESHOLD AMPLITUDE: 1 V
MDC IDC MSMT LEADCHNL LV PACING THRESHOLD PULSEWIDTH: 0.4 ms
MDC IDC MSMT LEADCHNL RA PACING THRESHOLD PULSEWIDTH: 0.4 ms
MDC IDC MSMT LEADCHNL RA SENSING INTR AMPL: 1.75 mV
MDC IDC MSMT LEADCHNL RA SENSING INTR AMPL: 1.75 mV
MDC IDC MSMT LEADCHNL RV IMPEDANCE VALUE: 285 Ohm
MDC IDC MSMT LEADCHNL RV IMPEDANCE VALUE: 361 Ohm
MDC IDC MSMT LEADCHNL RV PACING THRESHOLD PULSEWIDTH: 0.4 ms
MDC IDC SESS DTM: 20170117201450
MDC IDC SET LEADCHNL LV PACING PULSEWIDTH: 0.4 ms
MDC IDC SET LEADCHNL RA PACING AMPLITUDE: 2 V
MDC IDC SET LEADCHNL RV SENSING SENSITIVITY: 0.3 mV
MDC IDC STAT BRADY AP VP PERCENT: 99.92 %
MDC IDC STAT BRADY AP VS PERCENT: 0.07 %
MDC IDC STAT BRADY AS VP PERCENT: 0.01 %
MDC IDC STAT BRADY AS VS PERCENT: 0 %

## 2015-11-18 ENCOUNTER — Ambulatory Visit (INDEPENDENT_AMBULATORY_CARE_PROVIDER_SITE_OTHER): Payer: Medicare Other

## 2015-11-18 ENCOUNTER — Telehealth: Payer: Self-pay | Admitting: *Deleted

## 2015-11-18 ENCOUNTER — Encounter: Payer: Self-pay | Admitting: Podiatry

## 2015-11-18 ENCOUNTER — Ambulatory Visit (INDEPENDENT_AMBULATORY_CARE_PROVIDER_SITE_OTHER): Payer: Medicare Other | Admitting: Podiatry

## 2015-11-18 VITALS — BP 132/78 | HR 94 | Resp 18

## 2015-11-18 DIAGNOSIS — S91302A Unspecified open wound, left foot, initial encounter: Secondary | ICD-10-CM

## 2015-11-18 DIAGNOSIS — I255 Ischemic cardiomyopathy: Secondary | ICD-10-CM | POA: Diagnosis not present

## 2015-11-18 DIAGNOSIS — M79672 Pain in left foot: Secondary | ICD-10-CM

## 2015-11-18 MED ORDER — CLINDAMYCIN HCL 300 MG PO CAPS: 300.0000 mg | ORAL_CAPSULE | Freq: Three times a day (TID) | ORAL | Status: DC

## 2015-11-18 MED ORDER — COLLAGENASE 250 UNIT/GM EX OINT: 1.0000 "application " | TOPICAL_OINTMENT | Freq: Every day | CUTANEOUS | Status: AC

## 2015-11-18 NOTE — Progress Notes (Signed)
   Subjective:    Patient ID: [name], male    DOB: Feb 22, 1930, 82 y.o.   MRN: [id number]  HPI   82 year old male presents the office today with concerns about wound on the back of his left heel which is been ongoing for several weeks. He is referred by the kidney center. He is unsure of how the area started. He denies any drainage or pus or any malodor. Has had no previous treatment.  The wound appears to be worsening.    Review of Systems  All other systems reviewed and are negative.      Objective:   Physical Exam General: AAO x3, NAD  Dermatological: on the posterior aspect of the left heel is a fibrotic wound with a sharp in the middle measuring 4 x 3 cm. There is no probing, undermining or tunneling. There is no swelling erythema, ascending cellulitis, fluctuance, crepitus, malodor, drainage or purulence. No other open lesions or pre-ulcer lesions identified at this time.  Vascular: Dorsalis Pedis artery and Posterior Tibial artery pedal pulses are 1/4 bilateral with immedate capillary fill time. Pedal hair growth present. No varicosities and no lower extremity edema present bilateral. There is no pain with calf compression, swelling, warmth, erythema.   Neruologic:  Sensation decreased with Derrel Nip monofilament,  Decreased vibratory sensation.  Musculoskeletal: No gross boney pedal deformities bilateral. No pain, crepitus, or limitation noted with foot and ankle range of motion bilateral. Muscular strength 5/5 in all groups tested bilateral.  Gait: Unassisted, Nonantalgic.       Assessment & Plan:  82 year old male left posterior heel wound -Treatment options discussed including all alternatives, risks, and complications -Etiology of symptoms were discussed -X-rays were obtained and reviewed with the patient.  There is no definitive evidence of cortical destruction to suggest osteomyelitis at this time. No soft tissue emphysema. -Light debridement was  performed today.  Ordered Santyl. -Arterial studies. -Surgical shoe dispensed today to help take pressure off the heel. -Offloading  Foam heel boot dispensed to help take pressure off the heel. Discussed strict offloading. -Discussed wound care center evaluation. -Discussed this is a limb threatening wound -Follow-up as scheduled. Call any questions or concerns or any changes symptoms.  Celesta Gentile, DPM

## 2015-11-18 NOTE — Telephone Encounter (Signed)
Arterial doppler orders faxed to [facility].

## 2015-11-18 NOTE — Telephone Encounter (Signed)
Pt states the cream Dr. Jacqualyn Posey ordered is too expensive, please change.  Dr. Jacqualyn Posey ordered Silvadene cream.  Left message informing pt.

## 2015-11-19 ENCOUNTER — Telehealth: Payer: Self-pay | Admitting: *Deleted

## 2015-11-19 DIAGNOSIS — S91302D Unspecified open wound, left foot, subsequent encounter: Secondary | ICD-10-CM

## 2015-11-19 MED ORDER — SILVER SULFADIAZINE 1 % EX CREA: 1.0000 "application " | TOPICAL_CREAM | Freq: Every day | CUTANEOUS | Status: AC

## 2015-11-19 NOTE — Telephone Encounter (Signed)
Pt's caregiver called states the less expensive cream for pt's foot has not been called in.  I reviewed mediation list and Dr. Jacqualyn Posey stated he had ordered Silvadene, but did not show as being ordered.  Dr. Jacqualyn Posey states order Silvadene cream 50grams apply daily to affected area +3 refills.  Orders to [facility] on [address].

## 2015-11-21 ENCOUNTER — Other Ambulatory Visit: Payer: Self-pay | Admitting: Podiatry

## 2015-11-21 DIAGNOSIS — R0989 Other specified symptoms and signs involving the circulatory and respiratory systems: Secondary | ICD-10-CM

## 2015-11-21 DIAGNOSIS — S91302A Unspecified open wound, left foot, initial encounter: Secondary | ICD-10-CM

## 2015-11-25 ENCOUNTER — Emergency Department (HOSPITAL_COMMUNITY): Payer: Medicare Other

## 2015-11-25 ENCOUNTER — Emergency Department (HOSPITAL_COMMUNITY)
Admission: EM | Admit: 2015-11-25 | Discharge: 2015-11-25 | Disposition: A | Discharge status: Home / Self Care | Payer: Medicare Other | Attending: Emergency Medicine | Admitting: Emergency Medicine

## 2015-11-25 ENCOUNTER — Encounter (HOSPITAL_COMMUNITY): Payer: Self-pay | Admitting: Cardiology

## 2015-11-25 DIAGNOSIS — Z7901 Long term (current) use of anticoagulants: Secondary | ICD-10-CM | POA: Diagnosis not present

## 2015-11-25 DIAGNOSIS — N186 End stage renal disease: Secondary | ICD-10-CM | POA: Insufficient documentation

## 2015-11-25 DIAGNOSIS — Z79899 Other long term (current) drug therapy: Secondary | ICD-10-CM | POA: Insufficient documentation

## 2015-11-25 DIAGNOSIS — Z87891 Personal history of nicotine dependence: Secondary | ICD-10-CM | POA: Diagnosis not present

## 2015-11-25 DIAGNOSIS — E785 Hyperlipidemia, unspecified: Secondary | ICD-10-CM | POA: Diagnosis not present

## 2015-11-25 DIAGNOSIS — R197 Diarrhea, unspecified: Secondary | ICD-10-CM | POA: Diagnosis not present

## 2015-11-25 DIAGNOSIS — Z95810 Presence of automatic (implantable) cardiac defibrillator: Secondary | ICD-10-CM | POA: Insufficient documentation

## 2015-11-25 DIAGNOSIS — R600 Localized edema: Secondary | ICD-10-CM | POA: Insufficient documentation

## 2015-11-25 DIAGNOSIS — Z7982 Long term (current) use of aspirin: Secondary | ICD-10-CM | POA: Insufficient documentation

## 2015-11-25 DIAGNOSIS — I2510 Atherosclerotic heart disease of native coronary artery without angina pectoris: Secondary | ICD-10-CM | POA: Insufficient documentation

## 2015-11-25 DIAGNOSIS — R609 Edema, unspecified: Secondary | ICD-10-CM

## 2015-11-25 DIAGNOSIS — M109 Gout, unspecified: Secondary | ICD-10-CM | POA: Insufficient documentation

## 2015-11-25 DIAGNOSIS — R110 Nausea: Secondary | ICD-10-CM | POA: Insufficient documentation

## 2015-11-25 DIAGNOSIS — I120 Hypertensive chronic kidney disease with stage 5 chronic kidney disease or end stage renal disease: Secondary | ICD-10-CM | POA: Diagnosis not present

## 2015-11-25 DIAGNOSIS — I252 Old myocardial infarction: Secondary | ICD-10-CM | POA: Diagnosis not present

## 2015-11-25 DIAGNOSIS — Z9861 Coronary angioplasty status: Secondary | ICD-10-CM | POA: Insufficient documentation

## 2015-11-25 DIAGNOSIS — F329 Major depressive disorder, single episode, unspecified: Secondary | ICD-10-CM | POA: Insufficient documentation

## 2015-11-25 DIAGNOSIS — Z792 Long term (current) use of antibiotics: Secondary | ICD-10-CM | POA: Diagnosis not present

## 2015-11-25 DIAGNOSIS — Z8546 Personal history of malignant neoplasm of prostate: Secondary | ICD-10-CM | POA: Diagnosis not present

## 2015-11-25 DIAGNOSIS — Z992 Dependence on renal dialysis: Secondary | ICD-10-CM | POA: Insufficient documentation

## 2015-11-25 DIAGNOSIS — I509 Heart failure, unspecified: Secondary | ICD-10-CM | POA: Diagnosis not present

## 2015-11-25 DIAGNOSIS — R531 Weakness: Secondary | ICD-10-CM | POA: Insufficient documentation

## 2015-11-25 DIAGNOSIS — R630 Anorexia: Secondary | ICD-10-CM | POA: Insufficient documentation

## 2015-11-25 DIAGNOSIS — I4891 Unspecified atrial fibrillation: Secondary | ICD-10-CM | POA: Insufficient documentation

## 2015-11-25 LAB — BASIC METABOLIC PANEL
Anion gap: 14 (ref 5–15)
BUN: 23 mg/dL — AB (ref 6–20)
CHLORIDE: 100 mmol/L — AB (ref 101–111)
CO2: 29 mmol/L (ref 22–32)
CREATININE: 4.39 mg/dL — AB (ref 0.61–1.24)
Calcium: 8.6 mg/dL — ABNORMAL LOW (ref 8.9–10.3)
GFR calc Af Amer: 13 mL/min — ABNORMAL LOW (ref >60)
GFR calc non Af Amer: 11 mL/min — ABNORMAL LOW (ref >60)
GLUCOSE: 86 mg/dL (ref 65–99)
POTASSIUM: 3.6 mmol/L (ref 3.5–5.1)
Sodium: 143 mmol/L (ref 135–145)

## 2015-11-25 LAB — CBC
HEMATOCRIT: 36.9 % — AB (ref 39.0–52.0)
Hemoglobin: 11.5 g/dL — ABNORMAL LOW (ref 13.0–17.0)
MCH: 27 pg (ref 26.0–34.0)
MCHC: 31.2 g/dL (ref 30.0–36.0)
MCV: 86.6 fL (ref 78.0–100.0)
PLATELETS: 221 10*3/uL (ref 150–400)
RBC: 4.26 MIL/uL (ref 4.22–5.81)
RDW: 17.2 % — AB (ref 11.5–15.5)
WBC: 7.4 10*3/uL (ref 4.0–10.5)

## 2015-11-25 MED ORDER — DIGOXIN 62.5 MCG PO TABS: 0.0625 mg | ORAL_TABLET | Freq: Every day | ORAL | Status: DC

## 2015-11-25 MED ORDER — DIPHENOXYLATE-ATROPINE 2.5-0.025 MG PO TABS: ORAL_TABLET | ORAL | Status: DC

## 2015-11-25 MED ORDER — ONDANSETRON 4 MG PO TBDP: 4.0000 mg | ORAL_TABLET | Freq: Once | ORAL | Status: DC

## 2015-11-25 MED ORDER — DIPHENOXYLATE-ATROPINE 2.5-0.025 MG PO TABS
1.0000 | ORAL_TABLET | Freq: Once | ORAL | Status: AC
  Administered 2015-11-25: 1 via ORAL
  Filled 2015-11-25: qty 1

## 2015-11-25 NOTE — ED Provider Notes (Signed)
CSN: [id number]     Arrival date & time 11/25/15  25 History   First MD Initiated Contact with Patient 11/25/15 1557     Chief Complaint  Patient presents with  . Weakness      HPI  Pleasant 82 year old male presents for evaluation of a poor appetite and weakness and several falls over the last 7-10 days.  7 days ago was seen by podiatry and had debridement of the left foot ulceration. Started on Silvadene, prickly, nothing by mouth clindamycin. Was on the clindamycin 3 times per day. Cut it down because he felt like it was upsetting his stomach. Started having diarrhea 3-4 days ago 2 or 3 episodes per day. Not profuse but loose. No blood. No abdominal pain. No vomiting or poor appetite. States that food upsets his stomach and makes her feel nausea.   Patient has a history of congestive heart failure. Atrial fibrillation, end-stage renal disease on 3 times weekly maintenance hemodialysis. Dialyzed yesterday. No change in his routine.  Past Medical History  Diagnosis Date  . Dyslipidemia   . CHF (congestive heart failure), NYHA class II ([place])   . Atrial fibrillation ([place])   . Hypothyroidism   . Coronary artery disease     a. s/p PTCA/DES to LAD 2006  . Hypertension   . Ischemic cardiomyopathy     a. s/p MDT CRTD  . AICD (automatic cardioverter/defibrillator) present   . Myocardial infarction ([place])   . Depression     hx  . History of blood transfusion 03/2015    LGIB  . Lower GI bleeding   . Arthritis     "everywhere"  . History of gout   . ESRD (end stage renal disease) on dialysis [facility])     "[place]; MWF" (07/10/2015)  . Prostate cancer ([place])     S/P seed implants   Past Surgical History  Procedure Laterality Date  . [employer] biv icd  10/2006    Dr Lovena Le  . Total knee arthroplasty Right   . Av fistula placement  10/13/2012    Procedure: ARTERIOVENOUS (AV) FISTULA CREATION;  Surgeon: Mal Misty, MD;  Location: [place];  Service: Vascular;  Laterality: Right;   Creation right arm arteriovenous fistula   . Insertion of dialysis catheter  10/13/2012    Procedure: INSERTION OF DIALYSIS CATHETER;  Surgeon: Mal Misty, MD;  Location: [place];  Service: Vascular;  Laterality: N/A;  right internal jugular vein  . Ligation of competing branches of arteriovenous fistula  11/24/2012    Procedure: LIGATION OF COMPETING BRANCHES OF ARTERIOVENOUS FISTULA;  Surgeon: Rosetta Posner, MD;  Location: [facility] OR;  Service: Vascular;  Laterality: Right;  ligation of competing branch right arteriovenous fistula  . Av fistula placement Left 01/08/2013    Procedure: ARTERIOVENOUS (AV) FISTULA CREATION;  Surgeon: Elam Dutch, MD;  Location: [place];  Service: Vascular;  Laterality: Left;  . Cardioversion N/A 07/21/2012    Procedure: CARDIOVERSION;  Surgeon: Evans Lance, MD;  Location: [facility] CATH LAB;  Service: Cardiovascular;  Laterality: N/A;  . Esophagogastroduodenoscopy Left 04/09/2015    Procedure: ESOPHAGOGASTRODUODENOSCOPY (EGD);  Surgeon: Laurence Spates, MD;  Location: [facility] ENDOSCOPY;  Service: Endoscopy;  Laterality: Left;  . Colonoscopy N/A 04/10/2015    Procedure: COLONOSCOPY;  Surgeon: Laurence Spates, MD;  Location: [place];  Service: Endoscopy;  Laterality: N/A;  . Joint replacement    . Biv icd genertaor change out  X 1    "replaced the battery"  .  Coronary angioplasty with stent placement  02/2005    PTCA and DES to LAD Dr Terrence Dupont  . Insertion prostate radiation seed    . Ep implantable device N/A 08/07/2015    Procedure: ICD/BIV ICD Generator Changeout;  Surgeon: Evans Lance, MD;  Location: [place] CV LAB;  Service: Cardiovascular;  Laterality: N/A;   Family History  Problem Relation Age of Onset  . Diabetes Mother    Social History  Substance Use Topics  . Smoking status: Former Smoker -- 10 years    Types: Cigarettes, Cigars  . Smokeless tobacco: Never Used     Comment: stopped smoking in 1970  . Alcohol Use: Yes     Comment: "stopped drinking  alcohol in the 1980's"    Review of Systems  Constitutional: Positive for activity change and appetite change. Negative for fever, chills, diaphoresis, fatigue and unexpected weight change.  HENT: Negative for mouth sores, sore throat and trouble swallowing.   Eyes: Negative for visual disturbance.  Respiratory: Negative for cough, chest tightness, shortness of breath and wheezing.   Cardiovascular: Negative for chest pain.  Gastrointestinal: Positive for nausea and diarrhea. Negative for vomiting, abdominal pain and abdominal distention.  Endocrine: Negative for polydipsia, polyphagia and polyuria.  Genitourinary: Negative for dysuria, frequency and hematuria.  Musculoskeletal: Negative for gait problem.  Skin: Negative for color change, pallor and rash.  Neurological: Positive for weakness. Negative for dizziness, syncope, light-headedness and headaches.  Hematological: Does not bruise/bleed easily.  Psychiatric/Behavioral: Negative for behavioral problems and confusion.      Allergies  Review of patient's allergies indicates no known allergies.  Home Medications   Prior to Admission medications   Medication Sig Start Date End Date Taking? Authorizing Provider  allopurinol (ZYLOPRIM) 100 MG tablet Take 100 mg by mouth daily.  06/12/12  Yes Historical Provider, MD  amiodarone (PACERONE) 200 MG tablet Take 200 mg by mouth daily.    Yes Historical Provider, MD  aspirin EC 81 MG EC tablet Take 1 tablet (81 mg total) by mouth daily. 07/11/15  Yes Dixie Dials, MD  atorvastatin (LIPITOR) 40 MG tablet Take 40 mg by mouth daily.     Yes Historical Provider, MD  carvedilol (COREG) 3.125 MG tablet Take 1 tablet (3.125 mg total) by mouth 2 (two) times daily with a meal. 07/11/15  Yes Dixie Dials, MD  clindamycin (CLEOCIN) 300 MG capsule Take 1 capsule (300 mg total) by mouth 3 (three) times daily. 11/18/15  Yes Trula Slade, DPM  collagenase (SANTYL) ointment Apply 1 application topically  daily. 11/18/15  Yes Trula Slade, DPM  digoxin (LANOXIN) 0.125 MG tablet Take 0.0625 mg by mouth daily.   Yes Historical Provider, MD  DULoxetine (CYMBALTA) 30 MG capsule Take 30 mg by mouth 2 (two) times daily.   Yes Historical Provider, MD  midodrine (PROAMATINE) 10 MG tablet TAKE 1 TABLET 3 TIMES A WEEK PRIOR TO DIALYSIS, AND 1 TABLET AS NEEDED 10/11/15  Yes Historical Provider, MD  Multiple Vitamin (MULTIVITAMIN WITH MINERALS) TABS Take 1 tablet by mouth daily. 50+   Yes Historical Provider, MD  silver sulfADIAZINE (SILVADENE) 1 % cream Apply 1 application topically daily. 11/19/15  Yes Trula Slade, DPM  warfarin (COUMADIN) 3 MG tablet Take as directed by the Coumadin clinic 10/21/15  Yes Historical Provider, MD  diphenoxylate-atropine (LOMOTIL) 2.5-0.025 MG tablet 1 tablet once per day if greater than one episode of diarrhea. 11/25/15   Tanna Furry, MD  mirtazapine (REMERON) 15 MG tablet  Take 15 mg by mouth at bedtime.    Historical Provider, MD   BP 109/56 mmHg  Pulse 71  Temp(Src) 97.6 F (36.4 C) (Oral)  Resp 18  Wt 171 lb (77.565 kg)  SpO2 100% Physical Exam  Constitutional: He is oriented to person, place, and time. He appears well-developed and well-nourished. No distress.  HENT:  Head: Normocephalic.  Eyes: Conjunctivae are normal. Pupils are equal, round, and reactive to light. No scleral icterus.  Neck: Normal range of motion. Neck supple. No thyromegaly present.  Cardiovascular: Normal rate and regular rhythm.  Exam reveals no gallop and no friction rub.   No murmur heard. AV fistula left upper arm. Normal thrill and bruit. Covered with a dressing.  Pulmonary/Chest: Effort normal and breath sounds normal. No respiratory distress. He has no wheezes. He has no rales.  Abdominal: Soft. Bowel sounds are normal. He exhibits no distension. There is no tenderness. There is no rebound.  Musculoskeletal: Normal range of motion.       Feet:  Neurological: He is alert and  oriented to person, place, and time.  Skin: Skin is warm and dry. No rash noted.  Psychiatric: He has a normal mood and affect. His behavior is normal.    ED Course  Procedures (including critical care time) Labs Review Labs Reviewed  BASIC METABOLIC PANEL - Abnormal; Notable for the following:    Chloride 100 (*)    BUN 23 (*)    Creatinine, Ser 4.39 (*)    Calcium 8.6 (*)    GFR calc non Af Amer 11 (*)    GFR calc Af Amer 13 (*)    All other components within normal limits  CBC - Abnormal; Notable for the following:    Hemoglobin 11.5 (*)    HCT 36.9 (*)    RDW 17.2 (*)    All other components within normal limits  C DIFFICILE QUICK SCREEN W PCR REFLEX    Imaging Review Ct Head Wo Contrast  11/25/2015  CLINICAL DATA:  Increased fall over several days. Bilateral leg weakness. EXAM: CT HEAD WITHOUT CONTRAST TECHNIQUE: Contiguous axial images were obtained from the base of the skull through the vertex without intravenous contrast. COMPARISON:  None. FINDINGS: No acute intracranial hemorrhage. No focal mass lesion. No CT evidence of acute infarction. No midline shift or mass effect. No hydrocephalus. Basilar cisterns are patent. Paranasal sinuses and  mastoid air cells are clear. Atherosclerotic calcification of the vertebral arteries IMPRESSION: *No acute intracranial findings. *Chronic atrophy and white matter microvascular disease. Electronically Signed   By: Suzy Bouchard M.D.   On: 11/25/2015 15:21   Dg Chest Port 1 View  11/25/2015  CLINICAL DATA:  Multiple falls in past 2 weeks. Weakness. Congestive heart failure. Atrial fibrillation. EXAM: PORTABLE CHEST 1 VIEW COMPARISON:  08/29/2015 FINDINGS: Cardiomegaly is stable in AICD remains in place. Chronic elevation of left hemidiaphragm is again demonstrated with mild left basilar atelectasis. No evidence of pulmonary consolidation. No evidence of pneumothorax or pleural effusion. IMPRESSION: Stable cardiomegaly, elevated left  hemidiaphragm, and mild left basilar atelectasis. No acute findings. Electronically Signed   By: Earle Gell M.D.   On: 11/25/2015 17:01   I have personally reviewed and evaluated these images and lab results as part of my medical decision-making.   EKG Interpretation   Date/Time:  Tuesday November 25 2015 16:28:56 EST Ventricular Rate:  70 PR Interval:    QRS Duration: 161 QT Interval:  470 QTC Calculation: 507 R Axis:   -  87 Text Interpretation:  Accelerated junctional rhythm Nonspecific IVCD with  LAD Left ventricular hypertrophy Borderline T abnormalities, lateral leads  Confirmed by Jeneen Rinks  MD, [place] ([id number]) on 11/25/2015 6:01:47 PM      MDM   Final diagnoses:  Diarrhea, unspecified type  Weakness  Dependent edema    Dialysis patient undergoing treatment with clindamycin now developing diarrhea and poor appetite. Does not appear overtly dehydrated clinically. However describes generalized weakness. This may be due to some third spacing with his edema, dialysis loss, poor intake, and loss with diarrhea. Potassium appears stable. Is not tachycardic. No signs of infection. EKG and chest x-ray pending. He will try to obtain stool for C. difficile. It sounds like more than and hematocrit associated diarrhea that rather than C. difficile with his description. If unable to provide a stool sample remained simply hold his clindamycin temporarily and encourage by mouth intake.  18:10:  Patient without any additional stool output. We'll simply have him hold his clindamycin. He'll wear TED hose to hopefully decrease his leg edema. This may help his balance and coordination. Lomotil for diarrhea    Tanna Furry, MD 11/25/15 1806

## 2015-11-25 NOTE — ED Notes (Signed)
Pt reports he has been having increased falls at home and decreased appetite over the past couple of weeks. States his legs have been weak. Sent here for further work up.

## 2015-11-25 NOTE — Discharge Instructions (Signed)
Stop taking clindamycin. Buy TED hose at any pharmacy and where to decrease your leg swelling. Lomotil as needed if greater than one episode of diarrhea per day. Recheck with primary care if your diarrhea persists or worsens. Your follow-up appointment with your Podiatrist next week    Diarrhea Diarrhea is watery poop (stool). It can make you feel weak, tired, thirsty, or give you a dry mouth (signs of dehydration). Watery poop is a sign of another problem, most often an infection. It often lasts 2-3 days. It can last longer if it is a sign of something serious. Take care of yourself as told by your doctor. HOME CARE   Drink 1 cup (8 ounces) of fluid each time you have watery poop.  Do not drink the following fluids:  Those that contain simple sugars (fructose, glucose, galactose, lactose, sucrose, maltose).  Sports drinks.  Fruit juices.  Whole milk products.  Sodas.  Drinks with caffeine (coffee, tea, soda) or alcohol.  Oral rehydration solution may be used if the doctor says it is okay. You may make your own solution. Follow this recipe:   - teaspoon table salt.   teaspoon baking soda.   teaspoon salt substitute containing potassium chloride.  1 tablespoons sugar.  1 liter (34 ounces) of water.  Avoid the following foods:  High fiber foods, such as raw fruits and vegetables.  Nuts, seeds, and whole grain breads and cereals.   Those that are sweetened with sugar alcohols (xylitol, sorbitol, mannitol).  Try eating the following foods:  Starchy foods, such as rice, toast, pasta, low-sugar cereal, oatmeal, baked potatoes, crackers, and bagels.  Bananas.  Applesauce.  Eat probiotic-rich foods, such as yogurt and milk products that are fermented.  Wash your hands well after each time you have watery poop.  Only take medicine as told by your doctor.  Take a warm bath to help lessen burning or pain from having watery poop. GET HELP RIGHT AWAY IF:   You  cannot drink fluids without throwing up (vomiting).  You keep throwing up.  You have blood in your poop, or your poop looks black and tarry.  You do not pee (urinate) in 6-8 hours, or there is only a small amount of very dark pee.  You have belly (abdominal) pain that gets worse or stays in the same spot (localizes).  You are weak, dizzy, confused, or light-headed.  You have a very bad headache.  Your watery poop gets worse or does not get better.  You have a fever or lasting symptoms for more than 2-3 days.  You have a fever and your symptoms suddenly get worse. MAKE SURE YOU:   Understand these instructions.  Will watch your condition.  Will get help right away if you are not doing well or get worse.   This information is not intended to replace advice given to you by your health care provider. Make sure you discuss any questions you have with your health care provider.   Document Released: 03/29/2008 Document Revised: 11/01/2014 Document Reviewed: 06/18/2012 Elsevier Interactive Patient Education 2016 Else[employer] Inc.  Edema Edema is an abnormal buildup of fluids in your bodytissues. Edema is somewhatdependent on gravity to pull the fluid to the lowest place in your body. That makes the condition more common in the legs and thighs (lower extremities). Painless swelling of the feet and ankles is common and becomes more likely as you get older. It is also common in looser tissues, like around your eyes.  When  the affected area is squeezed, the fluid may move out of that spot and leave a dent for a few moments. This dent is called pitting.  CAUSES  There are many possible causes of edema. Eating too much salt and being on your feet or sitting for a long time can cause edema in your legs and ankles. Hot weather may make edema worse. Common medical causes of edema include:  Heart failure.  Liver disease.  Kidney disease.  Weak blood vessels in your legs.  Cancer.  An  injury.  Pregnancy.  Some medications.  Obesity. SYMPTOMS  Edema is usually painless.Your skin may look swollen or shiny.  DIAGNOSIS  Your health care provider may be able to diagnose edema by asking about your medical history and doing a physical exam. You may need to have tests such as X-rays, an electrocardiogram, or blood tests to check for medical conditions that may cause edema.  TREATMENT  Edema treatment depends on the cause. If you have heart, liver, or kidney disease, you need the treatment appropriate for these conditions. General treatment may include:  Elevation of the affected body part above the level of your heart.  Compression of the affected body part. Pressure from elastic bandages or support stockings squeezes the tissues and forces fluid back into the blood vessels. This keeps fluid from entering the tissues.  Restriction of fluid and salt intake.  Use of a water pill (diuretic). These medications are appropriate only for some types of edema. They pull fluid out of your body and make you urinate more often. This gets rid of fluid and reduces swelling, but diuretics can have side effects. Only use diuretics as directed by your health care provider. HOME CARE INSTRUCTIONS   Keep the affected body part above the level of your heart when you are lying down.   Do not sit still or stand for prolonged periods.   Do not put anything directly under your knees when lying down.  Do not wear constricting clothing or garters on your upper legs.   Exercise your legs to work the fluid back into your blood vessels. This may help the swelling go down.   Wear elastic bandages or support stockings to reduce ankle swelling as directed by your health care provider.   Eat a low-salt diet to reduce fluid if your health care provider recommends it.   Only take medicines as directed by your health care provider. SEEK MEDICAL CARE IF:   Your edema is not responding to  treatment.  You have heart, liver, or kidney disease and notice symptoms of edema.  You have edema in your legs that does not improve after elevating them.   You have sudden and unexplained weight gain. SEEK IMMEDIATE MEDICAL CARE IF:   You develop shortness of breath or chest pain.   You cannot breathe when you lie down.  You develop pain, redness, or warmth in the swollen areas.   You have heart, liver, or kidney disease and suddenly get edema.  You have a fever and your symptoms suddenly get worse. MAKE SURE YOU:   Understand these instructions.  Will watch your condition.  Will get help right away if you are not doing well or get worse.   This information is not intended to replace advice given to you by your health care provider. Make sure you discuss any questions you have with your health care provider.   Document Released: 10/11/2005 Document Revised: 11/01/2014 Document Reviewed: 08/03/2013 Elsevier Interactive  Patient Education 2016 [employer].

## 2015-11-25 NOTE — ED Notes (Signed)
Pt ambulates independently and with steady gait at time of discharge. Discharge instructions and follow up information reviewed with patient. No other questions or concerns voiced at this time.  

## 2015-11-26 ENCOUNTER — Telehealth: Payer: Self-pay | Admitting: *Deleted

## 2015-11-26 NOTE — Telephone Encounter (Signed)
Pharmacy called related to Rx: Digoxin 62.5 MCG TABS not being covered by insurance...EDCM clarified with PharmD to change Rx to: Digoxin 125MCG Tabs, take half tab daily.

## 2015-12-02 ENCOUNTER — Ambulatory Visit (INDEPENDENT_AMBULATORY_CARE_PROVIDER_SITE_OTHER): Payer: Medicare Other | Admitting: Podiatry

## 2015-12-02 ENCOUNTER — Encounter: Payer: Self-pay | Admitting: Podiatry

## 2015-12-02 VITALS — BP 128/51 | HR 76 | Resp 14

## 2015-12-02 DIAGNOSIS — S91302D Unspecified open wound, left foot, subsequent encounter: Secondary | ICD-10-CM

## 2015-12-02 DIAGNOSIS — I255 Ischemic cardiomyopathy: Secondary | ICD-10-CM | POA: Diagnosis not present

## 2015-12-04 ENCOUNTER — Ambulatory Visit (HOSPITAL_COMMUNITY)
Admission: RE | Admit: 2015-12-04 | Discharge: 2015-12-04 | Disposition: A | Discharge status: Home / Self Care | Payer: Medicare Other | Source: Ambulatory Visit | Attending: Podiatry | Admitting: Podiatry

## 2015-12-04 ENCOUNTER — Encounter: Payer: Self-pay | Admitting: Podiatry

## 2015-12-04 DIAGNOSIS — I739 Peripheral vascular disease, unspecified: Secondary | ICD-10-CM

## 2015-12-04 DIAGNOSIS — X58XXXA Exposure to other specified factors, initial encounter: Secondary | ICD-10-CM | POA: Diagnosis not present

## 2015-12-04 DIAGNOSIS — M79672 Pain in left foot: Secondary | ICD-10-CM | POA: Diagnosis not present

## 2015-12-04 DIAGNOSIS — I70203 Unspecified atherosclerosis of native arteries of extremities, bilateral legs: Secondary | ICD-10-CM | POA: Insufficient documentation

## 2015-12-04 DIAGNOSIS — S91302A Unspecified open wound, left foot, initial encounter: Secondary | ICD-10-CM

## 2015-12-04 DIAGNOSIS — I10 Essential (primary) hypertension: Secondary | ICD-10-CM | POA: Diagnosis not present

## 2015-12-04 DIAGNOSIS — E785 Hyperlipidemia, unspecified: Secondary | ICD-10-CM | POA: Diagnosis not present

## 2015-12-04 DIAGNOSIS — Z87891 Personal history of nicotine dependence: Secondary | ICD-10-CM | POA: Diagnosis not present

## 2015-12-04 DIAGNOSIS — I2510 Atherosclerotic heart disease of native coronary artery without angina pectoris: Secondary | ICD-10-CM | POA: Diagnosis not present

## 2015-12-04 DIAGNOSIS — R0989 Other specified symptoms and signs involving the circulatory and respiratory systems: Secondary | ICD-10-CM | POA: Diagnosis present

## 2015-12-04 NOTE — Progress Notes (Signed)
Patient ID: [name], male   DOB: 1930/09/29, 82 y.o.   MRN: [id number]  Subjective: 82 year old male presents the office today for follow-up of wound on the back of his left heel which is been ongoing for several weeks. He feels it is drying up. He has been applying silvadene. He is awaitng vascular studies.He denies any drainage or pus or any malodor. Has had no previous treatment.  The wound appears to be worsening.  Objective: General: AAO x3, NAD  Dermatological: on the posterior aspect of the left heel is a fibrotic wound with a sharp in the middle measuring 3 x 2.5 x 0.3 cm.  The wound does not probe to bone although it is close. There is no surrounding erythema, ascending cellulitis, fluctuance, crepitus, malodor, drainage or pus. There are no other open lesions or pre-ulcerative lesions.  Vascular: Dorsalis Pedis artery and Posterior Tibial artery pedal pulses are 1/4 bilateral with immedate capillary fill time. Pedal hair growth present. No varicosities and no lower extremity edema present bilateral. There is no pain with calf compression, swelling, warmth, erythema.   Neruologic:  Sensation decreased with Derrel Nip monofilament,  Decreased vibratory sensation.  Musculoskeletal: No gross boney pedal deformities bilateral. No pain, crepitus, or limitation noted with foot and ankle range of motion bilateral. Muscular strength 5/5 in all groups tested bilateral.  Gait: Unassisted, Nonantalgic.   Assessment: 82 year old male left posterior heel wound; improved in size  Plan: -Treatment options discussed including all alternatives, risks, and complications - Continue today dressing changes. Continue with Silvadene. Continue strict offloading. Continue a surgical shoe. Awaiting DOS her studies. Discussed with him possible grafting pending vascular evaluation. -Discussed this is a limb threatening wound -Monitor for any clinical signs or symptoms of infection and directed to  call the office immediately should any occur or go to the ER. -Follow-up as scheduled. Call any questions or concerns or any changes symptoms.  Celesta Gentile, DPM

## 2015-12-08 NOTE — Telephone Encounter (Signed)
-----   Message from Trula Slade, DPM sent at 12/08/2015  7:56 AM EST ----- He needs to get in to see Dr. Gwenlyn Found ASAP. He has a heel wound that has been worsening and his vascular studies showed quite a bit of pathology. Thanks.

## 2015-12-09 ENCOUNTER — Encounter: Payer: Self-pay | Admitting: Cardiovascular Disease

## 2015-12-09 ENCOUNTER — Encounter: Payer: Self-pay | Admitting: Podiatry

## 2015-12-09 ENCOUNTER — Ambulatory Visit (INDEPENDENT_AMBULATORY_CARE_PROVIDER_SITE_OTHER): Payer: Medicare Other | Admitting: Podiatry

## 2015-12-09 ENCOUNTER — Ambulatory Visit (INDEPENDENT_AMBULATORY_CARE_PROVIDER_SITE_OTHER): Payer: Medicare Other | Admitting: Cardiovascular Disease

## 2015-12-09 VITALS — BP 90/44 | HR 71 | Ht 71.0 in | Wt 162.2 lb

## 2015-12-09 DIAGNOSIS — S91302D Unspecified open wound, left foot, subsequent encounter: Secondary | ICD-10-CM

## 2015-12-09 DIAGNOSIS — I255 Ischemic cardiomyopathy: Secondary | ICD-10-CM | POA: Diagnosis not present

## 2015-12-09 DIAGNOSIS — I998 Other disorder of circulatory system: Secondary | ICD-10-CM | POA: Diagnosis not present

## 2015-12-09 DIAGNOSIS — I70229 Atherosclerosis of native arteries of extremities with rest pain, unspecified extremity: Secondary | ICD-10-CM | POA: Insufficient documentation

## 2015-12-09 LAB — CBC WITH DIFFERENTIAL/PLATELET
Basophils Absolute: 0 10*3/uL (ref 0.0–0.1)
Basophils Relative: 0 % (ref 0–1)
EOS ABS: 0.1 10*3/uL (ref 0.0–0.7)
Eosinophils Relative: 1 % (ref 0–5)
HCT: 37.3 % — ABNORMAL LOW (ref 39.0–52.0)
HEMOGLOBIN: 11.6 g/dL — AB (ref 13.0–17.0)
LYMPHS ABS: 1.1 10*3/uL (ref 0.7–4.0)
Lymphocytes Relative: 12 % (ref 12–46)
MCH: 26.5 pg (ref 26.0–34.0)
MCHC: 31.1 g/dL (ref 30.0–36.0)
MCV: 85.4 fL (ref 78.0–100.0)
MONO ABS: 1.7 10*3/uL — AB (ref 0.1–1.0)
MONOS PCT: 18 % — AB (ref 3–12)
MPV: 10.5 fL (ref 8.6–12.4)
NEUTROS ABS: 6.5 10*3/uL (ref 1.7–7.7)
NEUTROS PCT: 69 % (ref 43–77)
Platelets: 286 10*3/uL (ref 150–400)
RBC: 4.37 MIL/uL (ref 4.22–5.81)
RDW: 17.4 % — ABNORMAL HIGH (ref 11.5–15.5)
WBC: 9.4 10*3/uL (ref 4.0–10.5)

## 2015-12-09 LAB — BASIC METABOLIC PANEL
BUN: 25 mg/dL (ref 7–25)
CHLORIDE: 97 mmol/L — AB (ref 98–110)
CO2: 31 mmol/L (ref 20–31)
CREATININE: 3.96 mg/dL — AB (ref 0.70–1.11)
Calcium: 8.5 mg/dL — ABNORMAL LOW (ref 8.6–10.3)
GLUCOSE: 100 mg/dL — AB (ref 65–99)
Potassium: 4 mmol/L (ref 3.5–5.3)
Sodium: 139 mmol/L (ref 135–146)

## 2015-12-09 NOTE — Progress Notes (Signed)
12/09/2015 Bretta Bang   1930/05/23  [id number]  Primary Physician [name], MD Primary Cardiologist: Lorretta Harp MD Renae Gloss   HPI:  [name] is an 82 year old thin-appearing married African-American male father of one child referred by Dr. Earleen Newport, his podiatrist for peripheral vascular evaluation because of critical limb ischemia. He sees Dr. Lovena Le because he has a private AICD secondary to ischemic cardiomyopathy. He also has chronic A. Fib on Coumadin anticoagulation. His other problems include history of hypertension and hyperlipidemia. He does have chronic renal insufficiency on hemodialysis as well. He has a left heel ulcer which has been present the last month. Recent Dopplers performed on 12/05/15 revealed occluded tibials bilaterally.   Current Outpatient Prescriptions  Medication Sig Dispense Refill  . allopurinol (ZYLOPRIM) 100 MG tablet Take 100 mg by mouth daily.     Marland Kitchen amiodarone (PACERONE) 200 MG tablet Take 200 mg by mouth daily.     Marland Kitchen aspirin EC 81 MG EC tablet Take 1 tablet (81 mg total) by mouth daily.    Marland Kitchen atorvastatin (LIPITOR) 40 MG tablet Take 40 mg by mouth daily.      . carvedilol (COREG) 3.125 MG tablet Take 1 tablet (3.125 mg total) by mouth 2 (two) times daily with a meal. 60 tablet 1  . collagenase (SANTYL) ointment Apply 1 application topically daily. 15 g 0  . Digoxin 62.5 MCG TABS Take 0.0625 mg by mouth daily. 30 tablet 0  . diphenoxylate-atropine (LOMOTIL) 2.5-0.025 MG tablet 1 tablet once per day if greater than one episode of diarrhea. 10 tablet 0  . DULoxetine (CYMBALTA) 30 MG capsule Take 30 mg by mouth daily.    Marland Kitchen ethyl chloride spray Apply 1 application topically as needed. (prior to dialysis)  12  . midodrine (PROAMATINE) 10 MG tablet TAKE 1 TABLET 3 TIMES A WEEK PRIOR TO DIALYSIS, AND 1 TABLET AS NEEDED  5  . Multiple Vitamin (MULTIVITAMIN WITH MINERALS) TABS Take 1 tablet by mouth daily. 50+    . silver  sulfADIAZINE (SILVADENE) 1 % cream Apply 1 application topically daily. 50 g 3  . warfarin (COUMADIN) 3 MG tablet Take as directed by the Coumadin clinic  3   No current facility-administered medications for this visit.    No Known Allergies  Social History   Social History  . Marital Status: Married    Spouse Name: N/A  . Number of Children: N/A  . Years of Education: N/A   Occupational History  . retired    Social History Main Topics  . Smoking status: Former Smoker -- 10 years    Types: Cigarettes, Cigars  . Smokeless tobacco: Never Used     Comment: stopped smoking in 1970  . Alcohol Use: Yes     Comment: "stopped drinking alcohol in the 1980's"  . Drug Use: No  . Sexual Activity: Not on file   Other Topics Concern  . Not on file   Social History Narrative     Review of Systems: General: negative for chills, fever, night sweats or weight changes.  Cardiovascular: negative for chest pain, dyspnea on exertion, edema, orthopnea, palpitations, paroxysmal nocturnal dyspnea or shortness of breath Dermatological: negative for rash Respiratory: negative for cough or wheezing Urologic: negative for hematuria Abdominal: negative for nausea, vomiting, diarrhea, bright red blood per rectum, melena, or hematemesis Neurologic: negative for visual changes, syncope, or dizziness All other systems reviewed and are otherwise negative except as noted above.  Blood pressure 90/44, pulse 71, height 5\' 11"  (1.803 m), weight 162 lb 3.2 oz (73.573 kg).  General appearance: alert and no distress Neck: no adenopathy, no carotid bruit, no JVD, supple, symmetrical, trachea midline and thyroid not enlarged, symmetric, no tenderness/mass/nodules Lungs: clear to auscultation bilaterally Heart: irregularly irregular rhythm Extremities: extremities normal, atraumatic, no cyanosis or edema  EKG not performed today  ASSESSMENT AND PLAN:   Critical lower limb ischemia Mr. Mirchandani was  referred to me by Dr. Earleen Newport, his podiatrist, for critical limb ischemia. He has an ulcer on his left heel which has been present for the last month. There is no history of claudication. He had Dopplers in our office 12/05/15 revealing occluded tibials bilaterally. I suspect he also has a Baker's cyst on the left. I do not think he will heal his ulcer without antegrade flow and suspect that if this does not occur he will end up having a BKA. He is a hemodialysis patient. He is also on Coumadin and coagulation for chronic A. Fib and has ischemic cardiomyopathy. We talked about options and are planning to proceed with angiography and potential intervention next Monday. We'll ask him to hold his warfarin for 4 days prior to that.      Lorretta Harp MD FACP,FACC,FAHA, [facility] 12/09/2015 11:55 AM

## 2015-12-09 NOTE — Patient Instructions (Addendum)
Medication Instructions:  1) HOLD Coumadin for 4 days before procedure - Last dose on Wednesday, 12/10/2015  Labwork: Your physician recommends that you return for lab work in: TODAY The lab can be found on the FIRST FLOOR of out building in [address]   Testing/Procedures: Dr. Gwenlyn Found has ordered a peripheral angiogram to be done at [facility].  This procedure is going to look at the bloodflow in your lower extremities.  If Dr. Gwenlyn Found is able to open up the arteries, you will have to spend one night in the hospital.  If he is not able to open the arteries, you will be able to go home that same day.  SCHEDULE FOR Monday 12/15/2015  After the procedure, you will not be allowed to drive for 3 days or push, pull, or lift anything greater than 10 lbs for one week.    You will be required to have the following tests prior to the procedure:  1. Blood work-the blood work can be done no more than 7 days prior to the procedure.  It can be done at any [facility] lab.  There is one downstairs on the first floor of this building and one in the [facility] building [id number])  *REPS: Scott  Puncture site: Right Groin     Any Other Special Instructions Will Be Listed Below (If Applicable).     If you need a refill on your cardiac medications before your next appointment, please call your pharmacy.

## 2015-12-09 NOTE — Assessment & Plan Note (Signed)
[name] was referred to me by Dr. Earleen Newport, his podiatrist, for critical limb ischemia. He has an ulcer on his left heel which has been present for the last month. There is no history of claudication. He had Dopplers in our office 12/05/15 revealing occluded tibials bilaterally. I suspect he also has a Baker's cyst on the left. I do not think he will heal his ulcer without antegrade flow and suspect that if this does not occur he will end up having a BKA. He is a hemodialysis patient. He is also on Coumadin and coagulation for chronic A. Fib and has ischemic cardiomyopathy. We talked about options and are planning to proceed with angiography and potential intervention next Monday. We'll ask him to hold his warfarin for 4 days prior to that.

## 2015-12-10 ENCOUNTER — Other Ambulatory Visit: Payer: Self-pay

## 2015-12-10 LAB — APTT: aPTT: 98 seconds — ABNORMAL HIGH (ref 24–37)

## 2015-12-10 LAB — PROTIME-INR
INR: 3.6 — ABNORMAL HIGH (ref <1.50)
Prothrombin Time: 36.5 seconds — ABNORMAL HIGH (ref 11.6–15.2)

## 2015-12-11 DIAGNOSIS — S91302A Unspecified open wound, left foot, initial encounter: Secondary | ICD-10-CM | POA: Insufficient documentation

## 2015-12-11 NOTE — Progress Notes (Signed)
Patient ID: [name], male   DOB: Nov 18, 1929, 82 y.o.   MRN: [id number]  Subjective: 82 year old male presents the office today for follow-up of wound on the back of his left heel which is been ongoing for several weeks. He feels it is about the same as before. He has been applying silvadene. He saw Dr. Gwenlyn Found today any scheduled for an angiogram on Monday. He denies any drainage or pus or any malodor. Has had no previous treatment.  The wound appears to be worsening.  Objective: General: AAO x3, NAD  Dermatological: on the posterior aspect of the left heel is a fibrotic wound with a sharp in the middle measuring 3 x 2.5 x 0.3 cm.  The wound does not probe to bone although it is close. There is no surrounding erythema, ascending cellulitis, fluctuance, crepitus, malodor, drainage or pus. There are no other open lesions or pre-ulcerative lesions. Exam remains grossly unchanged.  Vascular: Dorsalis Pedis artery and Posterior Tibial artery pedal pulses are 1/4 bilateral with immedate capillary fill time. Pedal hair growth present. No varicosities and no lower extremity edema present bilateral. There is no pain with calf compression, swelling, warmth, erythema.   Neruologic:  Sensation decreased with Derrel Nip monofilament,  Decreased vibratory sensation.  Musculoskeletal: No gross boney pedal deformities bilateral. No pain, crepitus, or limitation noted with foot and ankle range of motion bilateral. Muscular strength 5/5 in all groups tested bilateral.  Gait: Unassisted, Nonantalgic.   Assessment: 82 year old male left posterior heel wound; improved in size  Plan: -Treatment options discussed including all alternatives, risks, and complications - Continue today dressing changes. Continue with Silvadene. Continue strict offloading. Continue a surgical shoe. -Angio Monday.  -Discussed this is a limb threatening wound -Monitor for any clinical signs or symptoms of infection and directed  to call the office immediately should any occur or go to the ER. -Follow-up as scheduled. Call any questions or concerns or any changes symptoms.  Celesta Gentile, DPM

## 2015-12-15 ENCOUNTER — Encounter (HOSPITAL_COMMUNITY): Payer: Self-pay | Admitting: Physician Assistant

## 2015-12-15 ENCOUNTER — Inpatient Hospital Stay (HOSPITAL_COMMUNITY)
Admission: RE | Admit: 2015-12-15 | Discharge: 2015-12-19 | DRG: 270 | Disposition: A | Discharge status: Home / Self Care | Payer: Medicare Other | Source: Ambulatory Visit | Attending: Cardiovascular Disease | Admitting: Cardiovascular Disease

## 2015-12-15 DIAGNOSIS — E785 Hyperlipidemia, unspecified: Secondary | ICD-10-CM | POA: Diagnosis present

## 2015-12-15 DIAGNOSIS — F329 Major depressive disorder, single episode, unspecified: Secondary | ICD-10-CM | POA: Diagnosis present

## 2015-12-15 DIAGNOSIS — I447 Left bundle-branch block, unspecified: Secondary | ICD-10-CM | POA: Diagnosis present

## 2015-12-15 DIAGNOSIS — I2510 Atherosclerotic heart disease of native coronary artery without angina pectoris: Secondary | ICD-10-CM | POA: Diagnosis present

## 2015-12-15 DIAGNOSIS — N2581 Secondary hyperparathyroidism of renal origin: Secondary | ICD-10-CM | POA: Diagnosis present

## 2015-12-15 DIAGNOSIS — I998 Other disorder of circulatory system: Secondary | ICD-10-CM | POA: Diagnosis not present

## 2015-12-15 DIAGNOSIS — Z7901 Long term (current) use of anticoagulants: Secondary | ICD-10-CM

## 2015-12-15 DIAGNOSIS — I480 Paroxysmal atrial fibrillation: Secondary | ICD-10-CM | POA: Diagnosis present

## 2015-12-15 DIAGNOSIS — L89529 Pressure ulcer of left ankle, unspecified stage: Secondary | ICD-10-CM | POA: Diagnosis present

## 2015-12-15 DIAGNOSIS — L8990 Pressure ulcer of unspecified site, unspecified stage: Secondary | ICD-10-CM | POA: Insufficient documentation

## 2015-12-15 DIAGNOSIS — L89210 Pressure ulcer of right hip, unstageable: Secondary | ICD-10-CM | POA: Diagnosis present

## 2015-12-15 DIAGNOSIS — Z7982 Long term (current) use of aspirin: Secondary | ICD-10-CM

## 2015-12-15 DIAGNOSIS — Z95810 Presence of automatic (implantable) cardiac defibrillator: Secondary | ICD-10-CM

## 2015-12-15 DIAGNOSIS — I10 Essential (primary) hypertension: Secondary | ICD-10-CM

## 2015-12-15 DIAGNOSIS — Z833 Family history of diabetes mellitus: Secondary | ICD-10-CM

## 2015-12-15 DIAGNOSIS — F99 Mental disorder, not otherwise specified: Secondary | ICD-10-CM | POA: Insufficient documentation

## 2015-12-15 DIAGNOSIS — I70234 Atherosclerosis of native arteries of right leg with ulceration of heel and midfoot: Principal | ICD-10-CM | POA: Diagnosis present

## 2015-12-15 DIAGNOSIS — I951 Orthostatic hypotension: Secondary | ICD-10-CM | POA: Diagnosis present

## 2015-12-15 DIAGNOSIS — L89520 Pressure ulcer of left ankle, unstageable: Secondary | ICD-10-CM | POA: Diagnosis present

## 2015-12-15 DIAGNOSIS — L89610 Pressure ulcer of right heel, unstageable: Secondary | ICD-10-CM | POA: Diagnosis present

## 2015-12-15 DIAGNOSIS — I252 Old myocardial infarction: Secondary | ICD-10-CM

## 2015-12-15 DIAGNOSIS — Z992 Dependence on renal dialysis: Secondary | ICD-10-CM

## 2015-12-15 DIAGNOSIS — I132 Hypertensive heart and chronic kidney disease with heart failure and with stage 5 chronic kidney disease, or end stage renal disease: Secondary | ICD-10-CM | POA: Diagnosis present

## 2015-12-15 DIAGNOSIS — Z8546 Personal history of malignant neoplasm of prostate: Secondary | ICD-10-CM

## 2015-12-15 DIAGNOSIS — Z955 Presence of coronary angioplasty implant and graft: Secondary | ICD-10-CM

## 2015-12-15 DIAGNOSIS — Z8719 Personal history of other diseases of the digestive system: Secondary | ICD-10-CM

## 2015-12-15 DIAGNOSIS — Z87891 Personal history of nicotine dependence: Secondary | ICD-10-CM

## 2015-12-15 DIAGNOSIS — L97429 Non-pressure chronic ulcer of left heel and midfoot with unspecified severity: Secondary | ICD-10-CM | POA: Diagnosis present

## 2015-12-15 DIAGNOSIS — N186 End stage renal disease: Secondary | ICD-10-CM | POA: Diagnosis present

## 2015-12-15 DIAGNOSIS — D638 Anemia in other chronic diseases classified elsewhere: Secondary | ICD-10-CM | POA: Diagnosis present

## 2015-12-15 DIAGNOSIS — I70229 Atherosclerosis of native arteries of extremities with rest pain, unspecified extremity: Secondary | ICD-10-CM

## 2015-12-15 DIAGNOSIS — Z923 Personal history of irradiation: Secondary | ICD-10-CM

## 2015-12-15 DIAGNOSIS — I5022 Chronic systolic (congestive) heart failure: Secondary | ICD-10-CM | POA: Diagnosis present

## 2015-12-15 DIAGNOSIS — R791 Abnormal coagulation profile: Secondary | ICD-10-CM | POA: Diagnosis not present

## 2015-12-15 DIAGNOSIS — I255 Ischemic cardiomyopathy: Secondary | ICD-10-CM | POA: Diagnosis present

## 2015-12-15 DIAGNOSIS — R2681 Unsteadiness on feet: Secondary | ICD-10-CM | POA: Diagnosis present

## 2015-12-15 DIAGNOSIS — Z96651 Presence of right artificial knee joint: Secondary | ICD-10-CM | POA: Diagnosis present

## 2015-12-15 DIAGNOSIS — R296 Repeated falls: Secondary | ICD-10-CM

## 2015-12-15 DIAGNOSIS — E039 Hypothyroidism, unspecified: Secondary | ICD-10-CM | POA: Diagnosis present

## 2015-12-15 DIAGNOSIS — I739 Peripheral vascular disease, unspecified: Secondary | ICD-10-CM

## 2015-12-15 HISTORY — DX: Anemia in other chronic diseases classified elsewhere: D63.8

## 2015-12-15 HISTORY — DX: Gastrointestinal hemorrhage, unspecified: K92.2

## 2015-12-15 HISTORY — DX: Supraventricular tachycardia: I47.1

## 2015-12-15 HISTORY — DX: Left bundle-branch block, unspecified: I44.7

## 2015-12-15 HISTORY — DX: Paroxysmal atrial fibrillation: I48.0

## 2015-12-15 HISTORY — DX: Other supraventricular tachycardia: I47.19

## 2015-12-15 HISTORY — DX: Chronic systolic (congestive) heart failure: I50.22

## 2015-12-15 HISTORY — DX: Peripheral vascular disease, unspecified: I73.9

## 2015-12-15 LAB — PROTIME-INR
INR: 4.42 — ABNORMAL HIGH (ref 0.00–1.49)
INR: 4.88 — ABNORMAL HIGH (ref 0.00–1.49)
Prothrombin Time: 41 seconds — ABNORMAL HIGH (ref 11.6–15.2)
Prothrombin Time: 44.2 seconds — ABNORMAL HIGH (ref 11.6–15.2)

## 2015-12-15 LAB — COMPREHENSIVE METABOLIC PANEL
ALBUMIN: 2.3 g/dL — AB (ref 3.5–5.0)
ALK PHOS: 186 U/L — AB (ref 38–126)
ALT: 38 U/L (ref 17–63)
ANION GAP: 11 (ref 5–15)
AST: 54 U/L — ABNORMAL HIGH (ref 15–41)
BUN: 38 mg/dL — ABNORMAL HIGH (ref 6–20)
CALCIUM: 8.9 mg/dL (ref 8.9–10.3)
CHLORIDE: 100 mmol/L — AB (ref 101–111)
CO2: 29 mmol/L (ref 22–32)
Creatinine, Ser: 6.18 mg/dL — ABNORMAL HIGH (ref 0.61–1.24)
GFR calc Af Amer: 9 mL/min — ABNORMAL LOW (ref >60)
GFR calc non Af Amer: 7 mL/min — ABNORMAL LOW (ref >60)
GLUCOSE: 98 mg/dL (ref 65–99)
Potassium: 4.5 mmol/L (ref 3.5–5.1)
SODIUM: 140 mmol/L (ref 135–145)
Total Bilirubin: 1.4 mg/dL — ABNORMAL HIGH (ref 0.3–1.2)
Total Protein: 6.4 g/dL — ABNORMAL LOW (ref 6.5–8.1)

## 2015-12-15 LAB — CBC WITH DIFFERENTIAL/PLATELET
BASOS PCT: 0 %
Basophils Absolute: 0 10*3/uL (ref 0.0–0.1)
Eosinophils Absolute: 0.2 10*3/uL (ref 0.0–0.7)
Eosinophils Relative: 2 %
HEMATOCRIT: 34.2 % — AB (ref 39.0–52.0)
HEMOGLOBIN: 10.7 g/dL — AB (ref 13.0–17.0)
LYMPHS ABS: 1.4 10*3/uL (ref 0.7–4.0)
LYMPHS PCT: 15 %
MCH: 26.3 pg (ref 26.0–34.0)
MCHC: 31.3 g/dL (ref 30.0–36.0)
MCV: 84 fL (ref 78.0–100.0)
MONO ABS: 1.6 10*3/uL — AB (ref 0.1–1.0)
MONOS PCT: 17 %
NEUTROS ABS: 6.1 10*3/uL (ref 1.7–7.7)
Neutrophils Relative %: 66 %
Platelets: 212 10*3/uL (ref 150–400)
RBC: 4.07 MIL/uL — ABNORMAL LOW (ref 4.22–5.81)
RDW: 19.2 % — AB (ref 11.5–15.5)
WBC: 9.2 10*3/uL (ref 4.0–10.5)

## 2015-12-15 LAB — TSH: TSH: 1.257 u[IU]/mL (ref 0.350–4.500)

## 2015-12-15 MED ORDER — SODIUM CHLORIDE 0.9 % WEIGHT BASED INFUSION: 1.0000 mL/kg/h | INTRAVENOUS | Status: DC

## 2015-12-15 MED ORDER — ATORVASTATIN CALCIUM 40 MG PO TABS
40.0000 mg | ORAL_TABLET | Freq: Every day | ORAL | Status: DC
  Administered 2015-12-16 – 2015-12-18 (×3): 40 mg via ORAL
  Filled 2015-12-15 (×4): qty 1

## 2015-12-15 MED ORDER — MIDODRINE HCL 5 MG PO TABS
10.0000 mg | ORAL_TABLET | ORAL | Status: DC
  Administered 2015-12-17: 10 mg via ORAL
  Filled 2015-12-15: qty 2

## 2015-12-15 MED ORDER — SODIUM CHLORIDE 0.9 % IV SOLN
125.0000 mg | INTRAVENOUS | Status: AC
  Administered 2015-12-16: 125 mg via INTRAVENOUS
  Filled 2015-12-15 (×2): qty 10

## 2015-12-15 MED ORDER — AMIODARONE HCL 200 MG PO TABS
200.0000 mg | ORAL_TABLET | Freq: Every day | ORAL | Status: DC
  Administered 2015-12-16 – 2015-12-18 (×3): 200 mg via ORAL
  Filled 2015-12-15 (×4): qty 1

## 2015-12-15 MED ORDER — DULOXETINE HCL 30 MG PO CPEP
30.0000 mg | ORAL_CAPSULE | Freq: Every day | ORAL | Status: DC
  Administered 2015-12-15 – 2015-12-18 (×4): 30 mg via ORAL
  Filled 2015-12-15 (×5): qty 1

## 2015-12-15 MED ORDER — ADULT MULTIVITAMIN W/MINERALS CH
1.0000 | ORAL_TABLET | Freq: Every day | ORAL | Status: DC
  Administered 2015-12-16 – 2015-12-19 (×4): 1 via ORAL
  Filled 2015-12-15 (×4): qty 1

## 2015-12-15 MED ORDER — SODIUM CHLORIDE 0.9% FLUSH: 3.0000 mL | INTRAVENOUS | Status: DC | PRN

## 2015-12-15 MED ORDER — ALLOPURINOL 100 MG PO TABS
100.0000 mg | ORAL_TABLET | Freq: Every day | ORAL | Status: DC
  Administered 2015-12-16 – 2015-12-18 (×3): 100 mg via ORAL
  Filled 2015-12-15 (×4): qty 1

## 2015-12-15 MED ORDER — SODIUM CHLORIDE 0.9 % IV SOLN: INTRAVENOUS | Status: DC

## 2015-12-15 MED ORDER — ASPIRIN EC 81 MG PO TBEC
81.0000 mg | DELAYED_RELEASE_TABLET | Freq: Every day | ORAL | Status: DC
Start: 2015-12-16 — End: 2015-12-17
  Administered 2015-12-16 – 2015-12-17 (×2): 81 mg via ORAL
  Filled 2015-12-15 (×2): qty 1

## 2015-12-15 MED ORDER — DARBEPOETIN ALFA 200 MCG/0.4ML IJ SOSY: 200.0000 ug | PREFILLED_SYRINGE | INTRAMUSCULAR | Status: DC

## 2015-12-15 MED ORDER — COLLAGENASE 250 UNIT/GM EX OINT
1.0000 "application " | TOPICAL_OINTMENT | Freq: Every day | CUTANEOUS | Status: DC
  Administered 2015-12-16: 1 via TOPICAL
  Filled 2015-12-15: qty 30

## 2015-12-15 MED ORDER — SILVER SULFADIAZINE 1 % EX CREA
1.0000 "application " | TOPICAL_CREAM | Freq: Every day | CUTANEOUS | Status: DC
  Administered 2015-12-16: 1 via TOPICAL
  Filled 2015-12-15: qty 85

## 2015-12-15 MED ORDER — SODIUM CHLORIDE 0.9 % WEIGHT BASED INFUSION: 3.0000 mL/kg/h | INTRAVENOUS | Status: DC

## 2015-12-15 MED ORDER — PHYTONADIONE 5 MG PO TABS
5.0000 mg | ORAL_TABLET | Freq: Once | ORAL | Status: AC
  Administered 2015-12-15: 5 mg via ORAL
  Filled 2015-12-15: qty 1

## 2015-12-15 MED ORDER — CARVEDILOL 3.125 MG PO TABS
3.1250 mg | ORAL_TABLET | Freq: Two times a day (BID) | ORAL | Status: DC
  Administered 2015-12-15 – 2015-12-18 (×4): 3.125 mg via ORAL
  Filled 2015-12-15 (×6): qty 1

## 2015-12-15 MED ORDER — ACETAMINOPHEN 325 MG PO TABS: 650.0000 mg | ORAL_TABLET | ORAL | Status: DC | PRN

## 2015-12-15 NOTE — H&P (Signed)
Cardiology H&P    Patient ID: [name] MRN: [id number], DOB: Oct 25, 1930 Date of Encounter: 12/15/2015, 1:10 PM Primary Physician: [name], MD Primary Cardiologist: Dr. Terrence Dupont Primary PV: Dr. Quay Burow Primary EP: Dr. Cristopher Peru  Chief Complaint: here for PV angiogram Reason for Admission: elevated INR  HPI: [name] is an 82 y/o M with history of CAD (ant-lat MI s/p PTCA/DES to LAD 2006), HTN, HLD, ESRD on HD, paroxysmal atrial fib on amiodarone/Coumadin (NSR by interrogation 10/2015), ICM/chronic systolic CHF (s/p ICD with upgrade to Medtronic BiV-ICD 07/2015 when it had reached ERI), GIB 03/2015 in setting of elevated INR, WCT felt to be atrial tachycardia 06/2015, anemia of chronic disease, hypothyroidism who presented to [facility] today initially for planned PV angio. He recently saw podiatry for a left heel ulcer which has been present for the last month. Recent Dopplers performed on 12/05/15 revealed occluded tibials bilaterally. He was instructed to hold his Coumadin after last dose 12/10/15 which he affirms that he did. He does admit he hasn't been eating and drinking that well - just doesn't have a robust appetite. He is unsteady on his feet at times - has fallen several times (mechanical) fortunately without significant injury. Denies any recent CP, SOB, syncope, orthopnea, BRBPR, melena, or hematemesis. Labs on 12/09/15 showed INR 3.60, Hgb 11.6 (stable), Cr 3.96. INR today was 4.42, repeated and was 4.88. No recent med changes or additions. No recent abx administered. 5mg  oral vitamin K has been ordered along with PT consult per Dr. Gwenlyn Found. He is being admitted for observation and monitoring of his supratherapeutic INR given his fragile medical history. He reports he already took home meds for the day (except coumadin as above).   Past Medical History  Diagnosis Date  . Dyslipidemia   . Chronic systolic CHF (congestive heart failure) ([place])     a. s/p ICD with  upgrade to Medtronic BiV-ICD 07/2015 when it had reached ERI.  . Paroxysmal atrial fibrillation ([place])   . Hypothyroidism   . Coronary artery disease     a. ant-lat MI s/p PTCA/DES to LAD 2006.  Marland Kitchen Hypertension   . Ischemic cardiomyopathy     a. s/p MDT CRTD  . AICD (automatic cardioverter/defibrillator) present   . Myocardial infarction ([place])   . Depression     hx  . History of blood transfusion 03/2015    LGIB  . GI bleed     a. 03/2015 in setting of elevated INR.  Marland Kitchen Arthritis     "everywhere"  . History of gout   . ESRD (end stage renal disease) on dialysis [facility])     "[place]; MWF" (07/10/2015)  . Prostate cancer ([place])     S/P seed implants  . Critical lower limb ischemia   . LBBB (left bundle branch block)   . Atrial tachycardia ([place])     a. WCT 06/2015 felt to be atrial tach.  . Anemia of chronic disease      Surgical History:  Past Surgical History  Procedure Laterality Date  . Medtronic biv icd  10/2006    Dr Lovena Le  . Total knee arthroplasty Right   . Av fistula placement  10/13/2012    Procedure: ARTERIOVENOUS (AV) FISTULA CREATION;  Surgeon: Mal Misty, MD;  Location: [place];  Service: Vascular;  Laterality: Right;  Creation right arm arteriovenous fistula   . Insertion of dialysis catheter  10/13/2012    Procedure: INSERTION OF DIALYSIS CATHETER;  Surgeon: Jeneen Rinks  Rockwell Alexandria, MD;  Location: [place];  Service: Vascular;  Laterality: N/A;  right internal jugular vein  . Ligation of competing branches of arteriovenous fistula  11/24/2012    Procedure: LIGATION OF COMPETING BRANCHES OF ARTERIOVENOUS FISTULA;  Surgeon: Rosetta Posner, MD;  Location: [facility] OR;  Service: Vascular;  Laterality: Right;  ligation of competing branch right arteriovenous fistula  . Av fistula placement Left 01/08/2013    Procedure: ARTERIOVENOUS (AV) FISTULA CREATION;  Surgeon: Elam Dutch, MD;  Location: [place];  Service: Vascular;  Laterality: Left;  . Cardioversion N/A 07/21/2012     Procedure: CARDIOVERSION;  Surgeon: Evans Lance, MD;  Location: [facility] CATH LAB;  Service: Cardiovascular;  Laterality: N/A;  . Esophagogastroduodenoscopy Left 04/09/2015    Procedure: ESOPHAGOGASTRODUODENOSCOPY (EGD);  Surgeon: Laurence Spates, MD;  Location: [facility] ENDOSCOPY;  Service: Endoscopy;  Laterality: Left;  . Colonoscopy N/A 04/10/2015    Procedure: COLONOSCOPY;  Surgeon: Laurence Spates, MD;  Location: [place];  Service: Endoscopy;  Laterality: N/A;  . Joint replacement    . Biv icd genertaor change out  X 1    "replaced the battery"  . Coronary angioplasty with stent placement  02/2005    PTCA and DES to LAD Dr Terrence Dupont  . Insertion prostate radiation seed    . Ep implantable device N/A 08/07/2015    Procedure: ICD/BIV ICD Generator Changeout;  Surgeon: Evans Lance, MD;  Location: [place] CV LAB;  Service: Cardiovascular;  Laterality: N/A;     Home Meds: Prior to Admission medications   Medication Sig Start Date End Date Taking? Authorizing Provider  allopurinol (ZYLOPRIM) 100 MG tablet Take 100 mg by mouth daily.  06/12/12  Yes Historical Provider, MD  amiodarone (PACERONE) 200 MG tablet Take 200 mg by mouth daily.    Yes Historical Provider, MD  aspirin EC 81 MG EC tablet Take 1 tablet (81 mg total) by mouth daily. 07/11/15  Yes Dixie Dials, MD  atorvastatin (LIPITOR) 40 MG tablet Take 40 mg by mouth daily.     Yes Historical Provider, MD  carvedilol (COREG) 3.125 MG tablet Take 1 tablet (3.125 mg total) by mouth 2 (two) times daily with a meal. 07/11/15  Yes Dixie Dials, MD  collagenase (SANTYL) ointment Apply 1 application topically daily. Patient taking differently: Apply 1 application topically daily as needed (for wound healing).  11/18/15  Yes Trula Slade, DPM  Digoxin 62.5 MCG TABS Take 0.0625 mg by mouth daily. 11/25/15  Yes Tanna Furry, MD  diphenoxylate-atropine (LOMOTIL) 2.5-0.025 MG tablet 1 tablet once per day if greater than one episode of diarrhea. Patient  taking differently: Take 1 tablet by mouth daily as needed for diarrhea or loose stools. 1 tablet once per day if greater than one episode of diarrhea. 11/25/15  Yes Tanna Furry, MD  DULoxetine (CYMBALTA) 30 MG capsule Take 30 mg by mouth daily. 12/01/15  Yes Historical Provider, MD  ethyl chloride spray Apply 1 application topically as needed. (prior to dialysis) 10/08/15  Yes Historical Provider, MD  midodrine (PROAMATINE) 10 MG tablet Take 10 mg by mouth every Monday, Wednesday, and Friday with hemodialysis. TAKE 1 TABLET 3 TIMES A WEEK PRIOR TO DIALYSIS, AND 1 TABLET AS NEEDED 10/11/15  Yes Historical Provider, MD  Multiple Vitamin (MULTIVITAMIN WITH MINERALS) TABS Take 1 tablet by mouth daily. 50+   Yes Historical Provider, MD  silver sulfADIAZINE (SILVADENE) 1 % cream Apply 1 application topically daily. 11/19/15  Yes Trula Slade, DPM  warfarin (COUMADIN) 3 MG tablet Take 3 mg by mouth daily at 6 PM. Take as directed by the Coumadin clinic  1.5 mg on Sunday and 3 mg all other days 10/21/15  Yes Historical Provider, MD    Allergies: No Known Allergies  Social History   Social History  . Marital Status: Married    Spouse Name: N/A  . Number of Children: N/A  . Years of Education: N/A   Occupational History  . retired    Social History Main Topics  . Smoking status: Former Smoker -- 10 years    Types: Cigarettes, Cigars  . Smokeless tobacco: Never Used     Comment: stopped smoking in 1970  . Alcohol Use: Yes     Comment: "stopped drinking alcohol in the 1980's"  . Drug Use: No  . Sexual Activity: Not on file   Other Topics Concern  . Not on file   Social History Narrative     Family History  Problem Relation Age of Onset  . Diabetes Mother     Review of Systems: No fevers, chills. All other systems reviewed and are otherwise negative except as noted above.  Labs:   Lab Results  Component Value Date   WBC 9.4 12/09/2015   HGB 11.6* 12/09/2015   HCT 37.3*  12/09/2015   MCV 85.4 12/09/2015   PLT 286 12/09/2015    Recent Labs Lab 12/09/15 1217  NA 139  K 4.0  CL 97*  CO2 31  BUN 25  CREATININE 3.96*  CALCIUM 8.5*  GLUCOSE 100*   Lab Results  Component Value Date   CHOL 101 07/10/2015   HDL 43 07/10/2015   LDLCALC 46 07/10/2015   TRIG 58 07/10/2015   Radiology/Studies:  Ct Head Wo Contrast  11/25/2015  CLINICAL DATA:  Increased fall over several days. Bilateral leg weakness. EXAM: CT HEAD WITHOUT CONTRAST TECHNIQUE: Contiguous axial images were obtained from the base of the skull through the vertex without intravenous contrast. COMPARISON:  None. FINDINGS: No acute intracranial hemorrhage. No focal mass lesion. No CT evidence of acute infarction. No midline shift or mass effect. No hydrocephalus. Basilar cisterns are patent. Paranasal sinuses and  mastoid air cells are clear. Atherosclerotic calcification of the vertebral arteries IMPRESSION: *No acute intracranial findings. *Chronic atrophy and white matter microvascular disease. Electronically Signed   By: Suzy Bouchard M.D.   On: 11/25/2015 15:21   Dg Chest Port 1 View  11/25/2015  CLINICAL DATA:  Multiple falls in past 2 weeks. Weakness. Congestive heart failure. Atrial fibrillation. EXAM: PORTABLE CHEST 1 VIEW COMPARISON:  08/29/2015 FINDINGS: Cardiomegaly is stable in AICD remains in place. Chronic elevation of left hemidiaphragm is again demonstrated with mild left basilar atelectasis. No evidence of pulmonary consolidation. No evidence of pneumothorax or pleural effusion. IMPRESSION: Stable cardiomegaly, elevated left hemidiaphragm, and mild left basilar atelectasis. No acute findings. Electronically Signed   By: Earle Gell M.D.   On: 11/25/2015 17:01   Dg Foot 2 Views Left  12/07/2015  2 views of a skeletally mature individual were obtained of the left foot. Study includes lateral and calcaneal axial. Calcaneal spurring is present. Vessel calcifications also present. There is  no definitive evidence of acute fracture or stress fracture. No cortical destruction suggest osteomyelitis at this time.  Wt Readings from Last 3 Encounters:  12/15/15 172 lb (78.019 kg)  12/09/15 162 lb 3.2 oz (73.573 kg)  11/25/15 171 lb (77.565 kg)   EKG:  EKG reviewed from 11/25/15: BiV  paced 70bpm  Physical Exam: Blood pressure 110/50, pulse 71, temperature 98.5 F (36.9 C), temperature source Oral, resp. rate 20, height 5\' 11"  (1.803 m), weight 172 lb (78.019 kg), SpO2 97 %. Body mass index is 24 kg/(m^2). General: Well developed elderly AAM, in no acute distress. Laying flat in bed. Head: Normocephalic, atraumatic, sclera non-icteric, no xanthomas, nares are without discharge.  Neck:  JVD not elevated. Lungs: Clear bilaterally to auscultation without wheezes, rales, or rhonchi. Breathing is unlabored. Heart: RRR with S1 S2. No murmurs, rubs, or gallops appreciated. Abdomen: Soft, non-tender, non-distended with normoactive bowel sounds. No hepatomegaly. No rebound/guarding. No obvious abdominal masses. Msk:  Strength and tone appear normal for age. Extremities: No clubbing or cyanosis. No edema.  Distal pedal pulses are 2+ and equal bilaterally. LUE AV fistula in place. LLE ulcer is wrapped. Neuro: Alert and oriented X 3. No focal deficit. No facial asymmetry. Moves all extremities spontaneously. Psych:  Responds to questions appropriately with a normal affect.    Assessment and Plan   1. Suspected PAD with ischemia of limb, awaiting PV angiogram - The patient will be admitted for further monitoring of his INR given his complex PMH. 5mg  oral vitamin K has already been ordered. He will also get PT evaluation given recent frequent falls. He denies any significant injuries or evidence of bleeding. Will obtain labs and f/u INR in AM. Will also get lipids in AM. He is tentatively on the schedule for Wednesday. Orders will need to be written tomorrow if he is stable.  2. Paroxysmal atrial  fibrillation on chronic Coumadin - as above, Coumadin placed on hold for PV angio. CHADSVASC 5. Per discussion with Dr. Gwenlyn Found, stop digoxin given his ESRD.   3. ESRD on HD - Will consult renal for management of HD MWF.   4. Chronic systolic CHF/ICM s/p BiV-ICD - euvolemic. Volume management per HD.  5. Essential HTN - controlled.  6. H/o GIB 03/2015 - no evidence of recurrence. F/u Hgb.  7. Recent frequent falls - PT eval as above.   Signed, Charlie Pitter PA-C 12/15/2015, 1:10 PM Pager: [phone]

## 2015-12-15 NOTE — Progress Notes (Signed)
Pt resting comfortably. Dressing applied to left leg and hip

## 2015-12-15 NOTE — Progress Notes (Signed)
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  Patient in stable condition and awaiting procedure after labs stabilize.

## 2015-12-15 NOTE — Progress Notes (Signed)
[facility] Renal Consultation Note  Indication for Consultation:  Management of ESRD/hemodialysis; anemia, hypertension/volume and secondary hyperparathyroidism  HPI: JONATH DERRINGTON is a 82 y.o. male admitted b y Dr. Adora Fridge for eval L Heel ulcer with  Ho frequent falls at home reported /pain with pressure on Heel per pt. H ehas an elavated INR (on coumadin for afib) and plans for  Angiogram when INR controlled by admit team. We are consulted for Hemodialysis needs, Normally MWF  Op HD ([facility])/ compliant with HD and HD time using LU AAVF with no ho of hyperkalemia or  Recent vol problems. Denies sob, Cp, Cough, N/ V/ D/ fevers/ LOC with falls . Does report some decrease appetite since L Heel ulcer increasing in Size.Painful to walk on heel .      Past Medical History  Diagnosis Date  . Dyslipidemia   . Chronic systolic CHF (congestive heart failure) ([place])     a. s/p ICD with upgrade to Medtronic BiV-ICD 07/2015 when it had reached ERI.  . Paroxysmal atrial fibrillation ([place])   . Hypothyroidism   . Coronary artery disease     a. ant-lat MI s/p PTCA/DES to LAD 2006.  Marland Kitchen Hypertension   . Ischemic cardiomyopathy     a. s/p MDT CRTD  . AICD (automatic cardioverter/defibrillator) present   . Myocardial infarction ([place])   . Depression     hx  . History of blood transfusion 03/2015    LGIB  . GI bleed     a. 03/2015 in setting of elevated INR.  Marland Kitchen Arthritis     "everywhere"  . History of gout   . ESRD (end stage renal disease) on dialysis [facility])     "[place]; MWF" (07/10/2015)  . Prostate cancer ([place])     S/P seed implants  . Critical lower limb ischemia   . LBBB (left bundle branch block)   . Atrial tachycardia ([place])     a. WCT 06/2015 felt to be atrial tach.  . Anemia of chronic disease     Past Surgical History  Procedure Laterality Date  . Medtronic biv icd  10/2006    Dr Lovena Le  . Total knee arthroplasty Right   . Av fistula placement  10/13/2012     Procedure: ARTERIOVENOUS (AV) FISTULA CREATION;  Surgeon: Mal Misty, MD;  Location: [place];  Service: Vascular;  Laterality: Right;  Creation right arm arteriovenous fistula   . Insertion of dialysis catheter  10/13/2012    Procedure: INSERTION OF DIALYSIS CATHETER;  Surgeon: Mal Misty, MD;  Location: [place];  Service: Vascular;  Laterality: N/A;  right internal jugular vein  . Ligation of competing branches of arteriovenous fistula  11/24/2012    Procedure: LIGATION OF COMPETING BRANCHES OF ARTERIOVENOUS FISTULA;  Surgeon: Rosetta Posner, MD;  Location: [facility] OR;  Service: Vascular;  Laterality: Right;  ligation of competing branch right arteriovenous fistula  . Av fistula placement Left 01/08/2013    Procedure: ARTERIOVENOUS (AV) FISTULA CREATION;  Surgeon: Elam Dutch, MD;  Location: [place];  Service: Vascular;  Laterality: Left;  . Cardioversion N/A 07/21/2012    Procedure: CARDIOVERSION;  Surgeon: Evans Lance, MD;  Location: [facility] CATH LAB;  Service: Cardiovascular;  Laterality: N/A;  . Esophagogastroduodenoscopy Left 04/09/2015    Procedure: ESOPHAGOGASTRODUODENOSCOPY (EGD);  Surgeon: Laurence Spates, MD;  Location: [facility] ENDOSCOPY;  Service: Endoscopy;  Laterality: Left;  . Colonoscopy N/A 04/10/2015    Procedure: COLONOSCOPY;  Surgeon:  Laurence Spates, MD;  Location: [facility] ENDOSCOPY;  Service: Endoscopy;  Laterality: N/A;  . Joint replacement    . Biv icd genertaor change out  X 1    "replaced the battery"  . Coronary angioplasty with stent placement  02/2005    PTCA and DES to LAD Dr Terrence Dupont  . Insertion prostate radiation seed    . Ep implantable device N/A 08/07/2015    Procedure: ICD/BIV ICD Generator Changeout;  Surgeon: Evans Lance, MD;  Location: [place] [facility] LAB;  Service: Cardiovascular;  Laterality: N/A;      Family History  Problem Relation Age of Onset  . Diabetes Mother       reports that he has quit smoking. His smoking use included Cigarettes and Cigars. He quit  after 10 years of use. He has never used smokeless tobacco. He reports that he drinks alcohol. He reports that he does not use illicit drugs.  No Known Allergies  Prior to Admission medications   Medication Sig Start Date End Date Taking? Authorizing Provider  allopurinol (ZYLOPRIM) 100 MG tablet Take 100 mg by mouth daily.  06/12/12  Yes Historical Provider, MD  amiodarone (PACERONE) 200 MG tablet Take 200 mg by mouth daily.    Yes Historical Provider, MD  aspirin EC 81 MG EC tablet Take 1 tablet (81 mg total) by mouth daily. 07/11/15  Yes Dixie Dials, MD  atorvastatin (LIPITOR) 40 MG tablet Take 40 mg by mouth daily.     Yes Historical Provider, MD  carvedilol (COREG) 3.125 MG tablet Take 1 tablet (3.125 mg total) by mouth 2 (two) times daily with a meal. 07/11/15  Yes Dixie Dials, MD  collagenase (SANTYL) ointment Apply 1 application topically daily. Patient taking differently: Apply 1 application topically daily as needed (for wound healing).  11/18/15  Yes Trula Slade, DPM  Digoxin 62.5 MCG TABS Take 0.0625 mg by mouth daily. 11/25/15  Yes Tanna Furry, MD  diphenoxylate-atropine (LOMOTIL) 2.5-0.025 MG tablet 1 tablet once per day if greater than one episode of diarrhea. Patient taking differently: Take 1 tablet by mouth daily as needed for diarrhea or loose stools. 1 tablet once per day if greater than one episode of diarrhea. 11/25/15  Yes Tanna Furry, MD  DULoxetine (CYMBALTA) 30 MG capsule Take 30 mg by mouth daily. 12/01/15  Yes Historical Provider, MD  ethyl chloride spray Apply 1 application topically as needed. (prior to dialysis) 10/08/15  Yes Historical Provider, MD  midodrine (PROAMATINE) 10 MG tablet Take 10 mg by mouth every Monday, Wednesday, and Friday with hemodialysis. TAKE 1 TABLET 3 TIMES A WEEK PRIOR TO DIALYSIS, AND 1 TABLET AS NEEDED 10/11/15  Yes Historical Provider, MD  Multiple Vitamin (MULTIVITAMIN WITH MINERALS) TABS Take 1 tablet by mouth daily. 50+   Yes Historical  Provider, MD  silver sulfADIAZINE (SILVADENE) 1 % cream Apply 1 application topically daily. 11/19/15  Yes Trula Slade, DPM  warfarin (COUMADIN) 3 MG tablet Take 3 mg by mouth daily at 6 PM. Take as directed by the Coumadin clinic  1.5 mg on Sunday and 3 mg all other days 10/21/15  Yes Historical Provider, MD     Continuous:  Anti-infectives    None      Results for orders placed or performed during the hospital encounter of 12/15/15 (from the past 48 hour(s))  Protime-INR     Status: Abnormal   Collection Time: 12/15/15  7:38 AM  Result Value Ref Range   Prothrombin Time 41.0 (  H) 11.6 - 15.2 seconds   INR 4.42 (H) 0.00 - 1.49  Protime-INR     Status: Abnormal   Collection Time: 12/15/15  9:19 AM  Result Value Ref Range   Prothrombin Time 44.2 (H) 11.6 - 15.2 seconds   INR 4.88 (H) 0.00 - 1.49     ROS: as above HPI    Physical Exam: Filed Vitals:   12/15/15 0719  BP: 110/50  Pulse: 71  Temp: 98.5 F (36.9 C)  Resp: 20     General: alert AA M alert /OX3 / appropriate / NAD  HEENT: [place]  EOMI, MMM Neck: supple no jvd Heart: RRR no mur , rub , gallop Lungs: CTA bilat /nonlabored breathing  Abdomen: Soft , NT, ND Extremities: Pedal edema L 1+ / trace Right / L heel Ulcer Dry  With hard skin surrounding dry ulcer  Base/ L toe tip smaller  dry ischemic ulcer   Skin: not overt rash /warm  Neuro: alert O3 moves all etrem Dialysis Access: lua avf pos bruit   Dialysis Orders: [facility]   on MWF . EDW 71.5 kg ( recently lowered 2/17 from 73kg )HD Bath 2k, 2.25ca  Time 4hr Heparin none  Access L UA AVF     Hec. 5 mcg IV/HD / Mircera 200 q 2weeks Last given 12/07/15   Units IV/HD  Venofer  50mg   q hd   Other op labs hgb 10.0 / ca 8.8 phos 2.9  pth 177   Assessment/Plan 1. L  Heel Ulcer (Suspected PAD  Per admit ) Awaiting PV  Angiogram  woth Supra therapeutic INR- Rx per admit team  2. ESRD -  HD MWF/ plan for hd in am as emergent cases in HD now / vol stable  K prior 4.0   Check labs pre hd/ 3. HO Afib with ^ INR - RX per admit  4. Hypertension/volume  - Uses Midodrine  For Bp control on HD / bp 110/50 /  Bed wt today  wt is 6.5  kg over edw  Not accurate  / ck standing wt / wife and pt report less po intake with heel ulcer  5. Anemia  -  OF ESRD - aranesp q wed hd  Fu HGB pre hd / Fe on wed hd  6. Metabolic bone disease -  Hec Iv on HD/ no binder as op  7. Recent falls per Ho - lives with Wife / rx per admit 8. HO GI Bld- no hep HD /    Ernest Haber, PA-C [place] [phone] 12/15/2015, 2:48 PM   Pt seen, examined and agree w A/P as above.  Kelly Splinter MD [employer] pager [phone]    cell [phone] 12/15/2015, 4:52 PM

## 2015-12-15 NOTE — Consult Note (Addendum)
WOC wound consult note Reason for Consult: Consult requested for left heel.  Pt has been followed by Dr Jacqualyn Posey of the podiatry service prior to admission for an unstageable pressure injury to left heel and has been using Silvadene for chemical debridement. He was last seen in the office on 2/16 and has developed a new wound since that time, he states. He also fell last week and has new pressure injuries to his right hip. Wound type: Right hip with unstageable pressure injury; .2X.2cm, 100% dry eschar tightly adhered.  No odor or drainage.  There is also a deep tissue injury next to this location; .3X.3cm, dark reddish purple. Left outer ankle with full thickness wound; 1.2X.1.2cm, 80% dark red wound bed; 20% slough; small amt yellow drainage, pt states it was previously a blister which evolved into an open wound this week. Right heel with unstageaqble pressure injury; 3X2.5X.3cm, 80% red, 20% slough/eschar, small amt tan drainage, no odor Pressure Ulcer POA: Yes Dressing procedure/placement/frequency: Continue present plan of care with Silvadene.  Float heel to reduce pressure.  Begin Silvadene to right hip to chemically debride nonviable tissue.  Pt can resume follow-up with podiatrist after discharge.  Discussed plan of care with patient and family and they deny further questions. Please re-consult if further assistance is needed.  Thank-you,  Julien Girt MSN, [place], [place], [place], [place]

## 2015-12-16 DIAGNOSIS — Z95810 Presence of automatic (implantable) cardiac defibrillator: Secondary | ICD-10-CM

## 2015-12-16 DIAGNOSIS — I10 Essential (primary) hypertension: Secondary | ICD-10-CM

## 2015-12-16 DIAGNOSIS — I5022 Chronic systolic (congestive) heart failure: Secondary | ICD-10-CM

## 2015-12-16 DIAGNOSIS — I998 Other disorder of circulatory system: Secondary | ICD-10-CM | POA: Diagnosis not present

## 2015-12-16 DIAGNOSIS — I255 Ischemic cardiomyopathy: Secondary | ICD-10-CM | POA: Diagnosis not present

## 2015-12-16 DIAGNOSIS — L8990 Pressure ulcer of unspecified site, unspecified stage: Secondary | ICD-10-CM | POA: Insufficient documentation

## 2015-12-16 DIAGNOSIS — N186 End stage renal disease: Secondary | ICD-10-CM

## 2015-12-16 LAB — RENAL FUNCTION PANEL
ALBUMIN: 2.1 g/dL — AB (ref 3.5–5.0)
ANION GAP: 11 (ref 5–15)
BUN: 43 mg/dL — AB (ref 6–20)
CO2: 28 mmol/L (ref 22–32)
Calcium: 8.7 mg/dL — ABNORMAL LOW (ref 8.9–10.3)
Chloride: 103 mmol/L (ref 101–111)
Creatinine, Ser: 6.86 mg/dL — ABNORMAL HIGH (ref 0.61–1.24)
GFR, EST AFRICAN AMERICAN: 8 mL/min — AB (ref >60)
GFR, EST NON AFRICAN AMERICAN: 7 mL/min — AB (ref >60)
Glucose, Bld: 79 mg/dL (ref 65–99)
PHOSPHORUS: 3.2 mg/dL (ref 2.5–4.6)
POTASSIUM: 4.2 mmol/L (ref 3.5–5.1)
Sodium: 142 mmol/L (ref 135–145)

## 2015-12-16 LAB — BASIC METABOLIC PANEL
ANION GAP: 13 (ref 5–15)
BUN: 42 mg/dL — AB (ref 6–20)
CALCIUM: 9 mg/dL (ref 8.9–10.3)
CO2: 29 mmol/L (ref 22–32)
Chloride: 101 mmol/L (ref 101–111)
Creatinine, Ser: 6.74 mg/dL — ABNORMAL HIGH (ref 0.61–1.24)
GFR calc Af Amer: 8 mL/min — ABNORMAL LOW (ref >60)
GFR calc non Af Amer: 7 mL/min — ABNORMAL LOW (ref >60)
GLUCOSE: 78 mg/dL (ref 65–99)
POTASSIUM: 4.1 mmol/L (ref 3.5–5.1)
Sodium: 143 mmol/L (ref 135–145)

## 2015-12-16 LAB — CBC
HEMATOCRIT: 31.9 % — AB (ref 39.0–52.0)
Hemoglobin: 10.2 g/dL — ABNORMAL LOW (ref 13.0–17.0)
MCH: 27.6 pg (ref 26.0–34.0)
MCHC: 32 g/dL (ref 30.0–36.0)
MCV: 86.4 fL (ref 78.0–100.0)
PLATELETS: 225 10*3/uL (ref 150–400)
RBC: 3.69 MIL/uL — ABNORMAL LOW (ref 4.22–5.81)
RDW: 19.4 % — AB (ref 11.5–15.5)
WBC: 7.6 10*3/uL (ref 4.0–10.5)

## 2015-12-16 LAB — LIPID PANEL
CHOL/HDL RATIO: 3.1 ratio
Cholesterol: 75 mg/dL (ref 0–200)
HDL: 24 mg/dL — AB (ref >40)
LDL CALC: 41 mg/dL (ref 0–99)
Triglycerides: 50 mg/dL (ref <150)
VLDL: 10 mg/dL (ref 0–40)

## 2015-12-16 LAB — PROTIME-INR
INR: 3.6 — ABNORMAL HIGH (ref 0.00–1.49)
Prothrombin Time: 35.1 seconds — ABNORMAL HIGH (ref 11.6–15.2)

## 2015-12-16 MED ORDER — DARBEPOETIN ALFA 200 MCG/0.4ML IJ SOSY: 200.0000 ug | PREFILLED_SYRINGE | INTRAMUSCULAR | Status: DC

## 2015-12-16 MED ORDER — PHYTONADIONE 5 MG PO TABS
5.0000 mg | ORAL_TABLET | Freq: Once | ORAL | Status: AC
  Administered 2015-12-16: 5 mg via ORAL
  Filled 2015-12-16 (×2): qty 1

## 2015-12-16 MED ORDER — WARFARIN - PHARMACIST DOSING INPATIENT
Freq: Every day | Status: DC
  Administered 2015-12-17: 18:00:00

## 2015-12-16 NOTE — Progress Notes (Signed)
ANTICOAGULATION CONSULT NOTE  Pharmacy Consult for warfarin Indication: atrial fibrillation  No Known Allergies  Patient Measurements: Height: 5\' 11"  (180.3 cm) Weight: 156 lb 8.4 oz (71 kg) IBW/kg (Calculated) : 75.3  Vital Signs: Temp: 97.8 F (36.6 C) (02/21 0750) Temp Source: Oral (02/21 0750) BP: 128/57 mmHg (02/21 1030) Pulse Rate: 70 (02/21 1030)  Labs:  Recent Labs  12/15/15 0738 12/15/15 0919 12/15/15 1634 12/16/15 0533 12/16/15 0822  HGB  --   --  10.7* 10.2*  --   HCT  --   --  34.2* 31.9*  --   PLT  --   --  212 225  --   LABPROT 41.0* 44.2*  --  35.1*  --   INR 4.42* 4.88*  --  3.60*  --   CREATININE  --   --  6.18* 6.74* 6.86*    Estimated Creatinine Clearance: 8.2 mL/min (by C-G formula based on Cr of 6.86).  Assessment: 25 YOM with AFib here for PV angiogram- hx of PAD with ischemia of limb. INR elevated on admission, received vitamin K 5mg  po x1 yesterday, another dose of 5mg  PO ordered for today. INR still high at 3.6. Hgb 10.2, plts 225- no bleeding noted (note: on Mircera PTA, Aranesp here for anemia of CKD).  Goal of Therapy:  INR 2-3 Monitor platelets by anticoagulation protocol: Yes   Plan:  -hold warfarin tonight with elevated INR and planned procedure tomorrow -follow INR and for s/s bleed  Oron Westrup D. Darcy Cordner, PharmD, BCPS Clinical Pharmacist Pager: [phone] 12/16/2015 11:02 AM

## 2015-12-16 NOTE — Progress Notes (Deleted)
[facility] Renal Consultation Note  Indication for Consultation:  Management of ESRD/hemodialysis; anemia, hypertension/volume and secondary hyperparathyroidism  HPI: [name] is a 82 y.o. male admitted b y Dr. Adora Fridge for eval L Heel ulcer with  Ho frequent falls at home reported /pain with pressure on Heel per pt. H ehas an elavated INR (on coumadin for afib) and plans for  Angiogram when INR controlled by admit team. We are consulted for Hemodialysis needs, Normally MWF  Op HD ([facility])/ compliant with HD and HD time using LU AAVF with no ho of hyperkalemia or  Recent vol problems. Denies sob, Cp, Cough, N/ V/ D/ fevers/ LOC with falls . Does report some decrease appetite since L Heel ulcer increasing in Size.Painful to walk on heel .      Past Medical History  Diagnosis Date  . Dyslipidemia   . Chronic systolic CHF (congestive heart failure) ([place])     a. s/p ICD with upgrade to Medtronic BiV-ICD 07/2015 when it had reached ERI.  . Paroxysmal atrial fibrillation ([name])   . Hypothyroidism   . Coronary artery disease     a. ant-lat MI s/p PTCA/DES to LAD 2006.  Marland Kitchen Hypertension   . Ischemic cardiomyopathy     a. s/p MDT CRTD  . AICD (automatic cardioverter/defibrillator) present   . Myocardial infarction ([name])   . Depression     hx  . History of blood transfusion 03/2015    LGIB  . GI bleed     a. 03/2015 in setting of elevated INR.  Marland Kitchen Arthritis     "everywhere"  . History of gout   . ESRD (end stage renal disease) on dialysis [facility])     "[place]; MWF" (07/10/2015)  . Prostate cancer ([place])     S/P seed implants  . Critical lower limb ischemia   . LBBB (left bundle branch block)   . Atrial tachycardia ([name])     a. WCT 06/2015 felt to be atrial tach.  . Anemia of chronic disease   . PAD (peripheral artery disease) [name])     Past Surgical History  Procedure Laterality Date  . Medtronic biv icd  10/2006    Dr Lovena Le  . Total knee arthroplasty  Right   . Av fistula placement  10/13/2012    Procedure: ARTERIOVENOUS (AV) FISTULA CREATION;  Surgeon: Mal Misty, MD;  Location: [place];  Service: Vascular;  Laterality: Right;  Creation right arm arteriovenous fistula   . Insertion of dialysis catheter  10/13/2012    Procedure: INSERTION OF DIALYSIS CATHETER;  Surgeon: Mal Misty, MD;  Location: [place];  Service: Vascular;  Laterality: N/A;  right internal jugular vein  . Ligation of competing branches of arteriovenous fistula  11/24/2012    Procedure: LIGATION OF COMPETING BRANCHES OF ARTERIOVENOUS FISTULA;  Surgeon: Rosetta Posner, MD;  Location[name];  Service: Vascular;  Laterality: Right;  ligation of competing branch right arteriovenous fistula  . Av fistula placement Left 01/08/2013    Procedure: ARTERIOVENOUS (AV) FISTULA CREATION;  Surgeon: Elam Dutch, MD;  Location: [place];  Service: Vascular;  Laterality: Left;  . Cardioversion N/A 07/21/2012    Procedure: CARDIOVERSION;  Surgeon: Evans Lance, MD;  [name];  Service: Cardiovascular;  Laterality: N/A;  . Esophagogastroduodenoscopy Left 04/09/2015    Procedure: ESOPHAGOGASTRODUODENOSCOPY (EGD);  Surgeon: Laurence Spates, MD;  Location: [facility] ENDOSCOPY;  Service: Endoscopy;  Laterality: Left;  . Colonoscopy N/A  04/10/2015    Procedure: COLONOSCOPY;  Surgeon: Laurence Spates, MD;  Location: [facility] ENDOSCOPY;  Service: Endoscopy;  Laterality: N/A;  . Joint replacement    . Biv icd genertaor change out  X 1    "replaced the battery"  . Coronary angioplasty with stent placement  02/2005    PTCA and DES to LAD Dr Terrence Dupont  . Insertion prostate radiation seed    . Ep implantable device N/A 08/07/2015    Procedure: ICD/BIV ICD Generator Changeout;  Surgeon: Evans Lance, MD;  Location: [name];  Service: Cardiovascular;  Laterality: N/A;      Family History  Problem Relation Age of Onset  . Diabetes Mother       reports that he has quit smoking. His smoking  use included Cigarettes and Cigars. He quit after 10 years of use. He has never used smokeless tobacco. He reports that he drinks alcohol. He reports that he does not use illicit drugs.  No Known Allergies  Prior to Admission medications   Medication Sig Start Date End Date Taking? Authorizing Provider  allopurinol (ZYLOPRIM) 100 MG tablet Take 100 mg by mouth daily.  06/12/12  Yes Historical Provider, MD  amiodarone (PACERONE) 200 MG tablet Take 200 mg by mouth daily.    Yes Historical Provider, MD  aspirin EC 81 MG EC tablet Take 1 tablet (81 mg total) by mouth daily. 07/11/15  Yes Dixie Dials, MD  atorvastatin (LIPITOR) 40 MG tablet Take 40 mg by mouth daily.     Yes Historical Provider, MD  carvedilol (COREG) 3.125 MG tablet Take 1 tablet (3.125 mg total) by mouth 2 (two) times daily with a meal. 07/11/15  Yes Dixie Dials, MD  collagenase (SANTYL) ointment Apply 1 application topically daily. Patient taking differently: Apply 1 application topically daily as needed (for wound healing).  11/18/15  Yes Trula Slade, DPM  Digoxin 62.5 MCG TABS Take 0.0625 mg by mouth daily. 11/25/15  Yes Tanna Furry, MD  diphenoxylate-atropine (LOMOTIL) 2.5-0.025 MG tablet 1 tablet once per day if greater than one episode of diarrhea. Patient taking differently: Take 1 tablet by mouth daily as needed for diarrhea or loose stools. 1 tablet once per day if greater than one episode of diarrhea. 11/25/15  Yes Tanna Furry, MD  DULoxetine (CYMBALTA) 30 MG capsule Take 30 mg by mouth daily. 12/01/15  Yes Historical Provider, MD  ethyl chloride spray Apply 1 application topically as needed. (prior to dialysis) 10/08/15  Yes Historical Provider, MD  midodrine (PROAMATINE) 10 MG tablet Take 10 mg by mouth every Monday, Wednesday, and Friday with hemodialysis. TAKE 1 TABLET 3 TIMES A WEEK PRIOR TO DIALYSIS, AND 1 TABLET AS NEEDED 10/11/15  Yes Historical Provider, MD  Multiple Vitamin (MULTIVITAMIN WITH MINERALS) TABS Take 1  tablet by mouth daily. 50+   Yes Historical Provider, MD  silver sulfADIAZINE (SILVADENE) 1 % cream Apply 1 application topically daily. 11/19/15  Yes Trula Slade, DPM  warfarin (COUMADIN) 3 MG tablet Take 3 mg by mouth daily at 6 PM. Take as directed by the Coumadin clinic  1.5 mg on Sunday and 3 mg all other days 10/21/15  Yes Historical Provider, MD     Continuous:  Anti-infectives    None      Results for orders placed or performed during the [name] encounter of 12/15/15 (from the past 48 hour(s))  Protime-INR     Status: Abnormal   Collection Time: 12/15/15  7:38 AM  Result  Value Ref Range   Prothrombin Time 41.0 (H) 11.6 - 15.2 seconds   INR 4.42 (H) 0.00 - 1.49  Protime-INR     Status: Abnormal   Collection Time: 12/15/15  9:19 AM  Result Value Ref Range   Prothrombin Time 44.2 (H) 11.6 - 15.2 seconds   INR 4.88 (H) 0.00 - 1.49  Comprehensive metabolic panel     Status: Abnormal   Collection Time: 12/15/15  4:34 PM  Result Value Ref Range   Sodium 140 135 - 145 mmol/L   Potassium 4.5 3.5 - 5.1 mmol/L   Chloride 100 (L) 101 - 111 mmol/L   CO2 29 22 - 32 mmol/L   Glucose, Bld 98 65 - 99 mg/dL   BUN 38 (H) 6 - 20 mg/dL   Creatinine, Ser 6.18 (H) 0.61 - 1.24 mg/dL   Calcium 8.9 8.9 - 10.3 mg/dL   Total Protein 6.4 (L) 6.5 - 8.1 g/dL   Albumin 2.3 (L) 3.5 - 5.0 g/dL   AST 54 (H) 15 - 41 U/L   ALT 38 17 - 63 U/L   Alkaline Phosphatase 186 (H) 38 - 126 U/L   Total Bilirubin 1.4 (H) 0.3 - 1.2 mg/dL   GFR calc non Af Amer 7 (L) >60 mL/min   GFR calc Af Amer 9 (L) >60 mL/min    Comment: (NOTE) The eGFR has been calculated using the CKD EPI equation. This calculation has not been validated in all clinical situations. eGFR's persistently <60 mL/min signify possible Chronic Kidney Disease.    Anion gap 11 5 - 15  CBC WITH DIFFERENTIAL     Status: Abnormal   Collection Time: 12/15/15  4:34 PM  Result Value Ref Range   WBC 9.2 4.0 - 10.5 K/uL   RBC 4.07 (L) 4.22 -  5.81 MIL/uL   Hemoglobin 10.7 (L) 13.0 - 17.0 g/dL   HCT 34.2 (L) 39.0 - 52.0 %   MCV 84.0 78.0 - 100.0 fL   MCH 26.3 26.0 - 34.0 pg   MCHC 31.3 30.0 - 36.0 g/dL   RDW 19.2 (H) 11.5 - 15.5 %   Platelets 212 150 - 400 K/uL   Neutrophils Relative % 66 %   Neutro Abs 6.1 1.7 - 7.7 K/uL   Lymphocytes Relative 15 %   Lymphs Abs 1.4 0.7 - 4.0 K/uL   Monocytes Relative 17 %   Monocytes Absolute 1.6 (H) 0.1 - 1.0 K/uL   Eosinophils Relative 2 %   Eosinophils Absolute 0.2 0.0 - 0.7 K/uL   Basophils Relative 0 %   Basophils Absolute 0.0 0.0 - 0.1 K/uL  TSH     Status: None   Collection Time: 12/15/15  4:34 PM  Result Value Ref Range   TSH 1.257 0.350 - 4.500 uIU/mL  Protime-INR     Status: Abnormal   Collection Time: 12/16/15  5:33 AM  Result Value Ref Range   Prothrombin Time 35.1 (H) 11.6 - 15.2 seconds   INR 3.60 (H) 0.00 - 1.49  CBC     Status: Abnormal   Collection Time: 12/16/15  5:33 AM  Result Value Ref Range   WBC 7.6 4.0 - 10.5 K/uL   RBC 3.69 (L) 4.22 - 5.81 MIL/uL   Hemoglobin 10.2 (L) 13.0 - 17.0 g/dL   HCT 31.9 (L) 39.0 - 52.0 %   MCV 86.4 78.0 - 100.0 fL   MCH 27.6 26.0 - 34.0 pg   MCHC 32.0 30.0 - 36.0 g/dL   RDW  19.4 (H) 11.5 - 15.5 %   Platelets 225 150 - 400 K/uL  Basic metabolic panel     Status: Abnormal   Collection Time: 12/16/15  5:33 AM  Result Value Ref Range   Sodium 143 135 - 145 mmol/L   Potassium 4.1 3.5 - 5.1 mmol/L   Chloride 101 101 - 111 mmol/L   CO2 29 22 - 32 mmol/L   Glucose, Bld 78 65 - 99 mg/dL   BUN 42 (H) 6 - 20 mg/dL   Creatinine, Ser 6.74 (H) 0.61 - 1.24 mg/dL   Calcium 9.0 8.9 - 10.3 mg/dL   GFR calc non Af Amer 7 (L) >60 mL/min   GFR calc Af Amer 8 (L) >60 mL/min    Comment: (NOTE) The eGFR has been calculated using the CKD EPI equation. This calculation has not been validated in all clinical situations. eGFR's persistently <60 mL/min signify possible Chronic Kidney Disease.    Anion gap 13 5 - 15  Lipid panel     Status:  Abnormal   Collection Time: 12/16/15  5:33 AM  Result Value Ref Range   Cholesterol 75 0 - 200 mg/dL   Triglycerides 50 <150 mg/dL   HDL 24 (L) >40 mg/dL   Total CHOL/HDL Ratio 3.1 RATIO   VLDL 10 0 - 40 mg/dL   LDL Cholesterol 41 0 - 99 mg/dL    Comment:        Total Cholesterol/HDL:CHD Risk Coronary Heart Disease Risk Table                     Men   Women  1/2 Average Risk   3.4   3.3  Average Risk       5.0   4.4  2 X Average Risk   9.6   7.1  3 X Average Risk  23.4   11.0        Use the calculated Patient Ratio above and the CHD Risk Table to determine the patient's CHD Risk.        ATP III CLASSIFICATION (LDL):  <100     mg/dL   Optimal  100-129  mg/dL   Near or Above                    Optimal  130-159  mg/dL   Borderline  160-189  mg/dL   High  >190     mg/dL   Very High   Renal function panel     Status: Abnormal   Collection Time: 12/16/15  8:22 AM  Result Value Ref Range   Sodium 142 135 - 145 mmol/L   Potassium 4.2 3.5 - 5.1 mmol/L   Chloride 103 101 - 111 mmol/L   CO2 28 22 - 32 mmol/L   Glucose, Bld 79 65 - 99 mg/dL   BUN 43 (H) 6 - 20 mg/dL   Creatinine, Ser 6.86 (H) 0.61 - 1.24 mg/dL   Calcium 8.7 (L) 8.9 - 10.3 mg/dL   Phosphorus 3.2 2.5 - 4.6 mg/dL   Albumin 2.1 (L) 3.5 - 5.0 g/dL   GFR calc non Af Amer 7 (L) >60 mL/min   GFR calc Af Amer 8 (L) >60 mL/min    Comment: (NOTE) The eGFR has been calculated using the CKD EPI equation. This calculation has not been validated in all clinical situations. eGFR's persistently <60 mL/min signify possible Chronic Kidney Disease.    Anion gap 11 5 - 15  ROS: as above HPI    Physical Exam: Filed Vitals:   12/16/15 1000 12/16/15 1030  BP: 130/62 128/57  Pulse: 69 70  Temp:    Resp:       General: alert AA M alert /OX3 / appropriate / NAD  HEENT:   EOMI, MMM Neck: supple no jvd Heart: RRR no mur , rub , gallop Lungs: CTA bilat /nonlabored breathing  Abdomen: Soft , NT, ND Extremities:  Pedal edema L 1+ / trace Right / L heel Ulcer Dry  With hard skin surrounding dry ulcer  Base/ L toe tip smaller  dry ischemic ulcer   Skin: not overt rash /warm  Neuro: alert O3 moves all etrem Dialysis Access: lua avf pos bruit   Dialysis Orders: [facility]   on MWF . EDW 71.5 kg ( recently lowered 2/17 from 73kg )HD Bath 2k, 2.25ca  Time 4hr Heparin none  Access L UA AVF     Hec. 5 mcg IV/HD / Mircera 200 q 2weeks Last given 12/07/15   Units IV/HD  Venofer  41m  q hd   Other op labs hgb 10.0 / ca 8.8 phos 2.9  pth 177   Assessment/Plan 1. L  Heel Ulcer (Suspected PAD  Per admit ) Awaiting PV  Angiogram  woth Supra therapeutic INR- Rx per admit team  2. ESRD -  HD MWF/ plan for hd in am as emergent cases in HD now / vol stable  K prior 4.0  Check labs pre hd/ 3. HO Afib with ^ INR - RX per admit  4. Hypertension/volume  - Uses Midodrine  For Bp control on HD / bp 110/50 /  Bed wt today  wt is 6.5  kg over edw  Not accurate  / ck standing wt / wife and pt report less po intake with heel ulcer  5. Anemia  -  OF ESRD - aranesp q wed hd  Fu HGB pre hd / Fe on wed hd  6. Metabolic bone disease -  Hec Iv on HD/ no binder as op  7. Recent falls per Ho - lives with Wife / rx per admit 8. HO GI Bld- no hep HD /    DErnest Haber PA-C [phone]/20/2017, 2:48 PM   Pt seen, examined and agree w A/P as above.  RKelly SplinterMD CNewell Rubbermaidpager [phone]   cell [phone]/20/2017, 3:45 PM

## 2015-12-16 NOTE — Progress Notes (Signed)
  [facility] Progress Note   Subjective: no compliants  Filed Vitals:   12/16/15 0900 12/16/15 0930 12/16/15 1000 12/16/15 1030  BP: 130/57 126/52 130/62 128/57  Pulse: 70 72 69 70  Temp:      TempSrc:      Resp:      Height:      Weight:      SpO2:        Inpatient medications: . allopurinol  100 mg Oral Daily  . amiodarone  200 mg Oral Daily  . aspirin EC  81 mg Oral Daily  . atorvastatin  40 mg Oral Daily  . carvedilol  3.125 mg Oral BID WC  . collagenase  1 application Topical Daily  . [START ON 12/24/2015] darbepoetin (ARANESP) injection - DIALYSIS  200 mcg Intravenous Q Wed-HD  . DULoxetine  30 mg Oral Daily  . ferric gluconate (FERRLECIT/NULECIT) IV  125 mg Intravenous Q Tue-HD  . midodrine  10 mg Oral Q M,W,F-HD  . multivitamin with minerals  1 tablet Oral Daily  . phytonadione  5 mg Oral Once  . silver sulfADIAZINE  1 application Topical Daily     acetaminophen, sodium chloride flush  Exam: Alert no distress on HD No jvd Chest clear bilat RRR no MRG Abd soft ntnd no mass or ascites Ext L heel ulcer dry, no drainage No LE edema Neuro alert nf ox 3 LUA AVF +bruit  Dialysis: [facility] MWF  4h  71.5kg  2/2.25 bath  Hep none  LUA AVF Hect 5 ug tiw Mircera 200 ug last 2/12 Venofer 50/ hd Hb 10/ Ca 8.8  / P 2.9 / pth 177      Assessment: 1 L foot/ heel ulcer - awaiting angiogram LLE, INR needs correcting first 2 ESRD HD today , then resume MWF 3 Vol at dry wt 4 Afib on MTP/ coumadin 5 ICM / ICD/ CAD hx stent 6 Chron hypotension on midodrine 7 MBD of CKD - vit D, no binder as op 8 Recent falls - lives w wife 9 Hx GIB - no hep HD  Plan - HD today , min UF, no hep   Kelly Splinter MD [facility] pager [phone]    cell [phone] 12/16/2015, 11:02 AM    Recent Labs Lab 12/15/15 1634 12/16/15 0533 12/16/15 0822  NA 140 143 142  K 4.5 4.1 4.2  CL 100* 101 103  CO2 29 29 28   GLUCOSE 98 78 79  BUN 38* 42* 43*   CREATININE 6.18* 6.74* 6.86*  CALCIUM 8.9 9.0 8.7*  PHOS  --   --  3.2    Recent Labs Lab 12/15/15 1634 12/16/15 0822  AST 54*  --   ALT 38  --   ALKPHOS 186*  --   BILITOT 1.4*  --   PROT 6.4*  --   ALBUMIN 2.3* 2.1*    Recent Labs Lab 12/09/15 1217 12/15/15 1634 12/16/15 0533  WBC 9.4 9.2 7.6  NEUTROABS 6.5 6.1  --   HGB 11.6* 10.7* 10.2*  HCT 37.3* 34.2* 31.9*  MCV 85.4 84.0 86.4  PLT 286 212 225

## 2015-12-16 NOTE — Progress Notes (Signed)
Patient Name: [name] Date of Encounter: 12/16/2015  Principal Problem:   Ischemia of extremity Active Problems:   Chronic systolic heart failure (HCC)   Automatic implantable cardioverter-defibrillator in situ   End stage renal disease (HCC)   Supratherapeutic INR   Cardiomyopathy, ischemic   PAD (peripheral artery disease) (HCC)   PAF (paroxysmal atrial fibrillation) ([place])   Essential hypertension   History of GI bleed   Frequent falls   Lower limb ischemia   Pressure ulcer   Length of Stay: 1  SUBJECTIVE  The patient denies limb pain or chest pain. He is underqoing hemodialysis today.  CURRENT MEDS . allopurinol  100 mg Oral Daily  . amiodarone  200 mg Oral Daily  . aspirin EC  81 mg Oral Daily  . atorvastatin  40 mg Oral Daily  . carvedilol  3.125 mg Oral BID WC  . collagenase  1 application Topical Daily  . [START ON 12/17/2015] darbepoetin (ARANESP) injection - DIALYSIS  200 mcg Intravenous Q Wed-HD  . DULoxetine  30 mg Oral Daily  . ferric gluconate (FERRLECIT/NULECIT) IV  125 mg Intravenous Q Tue-HD  . midodrine  10 mg Oral Q M,W,F-HD  . multivitamin with minerals  1 tablet Oral Daily  . silver sulfADIAZINE  1 application Topical Daily    OBJECTIVE  Filed Vitals:   12/16/15 0755 12/16/15 0827 12/16/15 0900 12/16/15 0930  BP: 137/58 126/53 130/57 126/52  Pulse: 69 65 70 72  Temp:      TempSrc:      Resp:      Height:      Weight:      SpO2:       No intake or output data in the 24 hours ending 12/16/15 1001 Filed Weights   12/15/15 0719 12/16/15 0549 12/16/15 0750  Weight: 172 lb (78.019 kg) 157 lb 4.8 oz (71.351 kg) 156 lb 8.4 oz (71 kg)    PHYSICAL EXAM  General: Pleasant, NAD. Neuro: Alert and oriented X 3. Moves all extremities spontaneously. Psych: Normal affect. HEENT:  Normal  Neck: Supple without bruits or JVD. Lungs:  Resp regular and unlabored, CTA. Heart: RRR no s3, s4, or murmurs. Abdomen: Soft, non-tender,  non-distended, BS + x 4.  Extremities: No clubbing, cyanosis or edema. DP/PT/Radials 2+ and equal bilaterally.  Accessory Clinical Findings  CBC  Recent Labs  12/15/15 1634 12/16/15 0533  WBC 9.2 7.6  NEUTROABS 6.1  --   HGB 10.7* 10.2*  HCT 34.2* 31.9*  MCV 84.0 86.4  PLT 212 [id number]   Basic Metabolic Panel  Recent Labs  12/16/15 0533 12/16/15 0822  NA 143 142  K 4.1 4.2  CL 101 103  CO2 29 28  GLUCOSE 78 79  BUN 42* 43*  CREATININE 6.74* 6.86*  CALCIUM 9.0 8.7*  PHOS  --  3.2   Liver Function Tests  Recent Labs  12/15/15 1634 12/16/15 0822  AST 54*  --   ALT 38  --   ALKPHOS 186*  --   BILITOT 1.4*  --   PROT 6.4*  --   ALBUMIN 2.3* 2.1*    Recent Labs  12/16/15 0533  CHOL 75  HDL 24*  LDLCALC 41  TRIG 50  CHOLHDL 3.1   Thyroid Function Tests  Recent Labs  12/15/15 1634  TSH 1.257   Radiology/Studies  Dg Foot 2 Views Left  12/07/2015  2 views of a skeletally mature individual were obtained of the left foot. Study  includes lateral and calcaneal axial. Calcaneal spurring is present. Vessel calcifications also present. There is no definitive evidence of acute fracture or stress fracture. No cortical destruction suggest osteomyelitis at this time.   ASSESSMENT AND PLAN  1. PAD with ischemia of limb, awaiting PV angiogram - The patient will be admitted for further monitoring of his INR given his complex PMH. 5mg  oral vitamin K has already been ordered. INR today 3.6, we will give another 5 mg of vitamin K and recheck in the am, he is scheduled for an angiography with Dr Gwenlyn Found tomorrow. Will also get lipids in AM.   2. Paroxysmal atrial fibrillation on chronic Coumadin - as above, Coumadin placed on hold for PV angio. CHADSVASC 5. Per discussion with Dr. Gwenlyn Found, stop digoxin given his ESRD.   3. ESRD on HD - in HD today, MWF.   4. Chronic systolic CHF/ICM s/p BiV-ICD - euvolemic. Volume management per HD.  5. Essential HTN - controlled.  6. H/o  GIB 03/2015 - no evidence of recurrence. F/u Hgb.  7. Recent frequent falls - PT eval as above.   Signed, Dorothy Spark MD, [facility] 12/16/2015

## 2015-12-16 NOTE — Progress Notes (Signed)
Rounded on patient to see if his dinner tray had come, and patient was confused and wanting to call 911. Pt states he ,"sees two trucks outside stealing stuff and putting into the truck that is parked right next to the yellow truck." Paged PA, PA to bedside. Will continue to monitor.

## 2015-12-17 ENCOUNTER — Encounter (HOSPITAL_COMMUNITY)
Admission: RE | Disposition: A | Discharge status: Home / Self Care | Payer: Self-pay | Source: Ambulatory Visit | Attending: Cardiovascular Disease

## 2015-12-17 DIAGNOSIS — N2581 Secondary hyperparathyroidism of renal origin: Secondary | ICD-10-CM | POA: Diagnosis present

## 2015-12-17 DIAGNOSIS — Z7982 Long term (current) use of aspirin: Secondary | ICD-10-CM | POA: Diagnosis not present

## 2015-12-17 DIAGNOSIS — I132 Hypertensive heart and chronic kidney disease with heart failure and with stage 5 chronic kidney disease, or end stage renal disease: Secondary | ICD-10-CM | POA: Diagnosis present

## 2015-12-17 DIAGNOSIS — L89610 Pressure ulcer of right heel, unstageable: Secondary | ICD-10-CM | POA: Diagnosis present

## 2015-12-17 DIAGNOSIS — R2681 Unsteadiness on feet: Secondary | ICD-10-CM | POA: Diagnosis present

## 2015-12-17 DIAGNOSIS — Z923 Personal history of irradiation: Secondary | ICD-10-CM | POA: Diagnosis not present

## 2015-12-17 DIAGNOSIS — I70234 Atherosclerosis of native arteries of right leg with ulceration of heel and midfoot: Secondary | ICD-10-CM | POA: Diagnosis present

## 2015-12-17 DIAGNOSIS — I447 Left bundle-branch block, unspecified: Secondary | ICD-10-CM | POA: Diagnosis present

## 2015-12-17 DIAGNOSIS — L97429 Non-pressure chronic ulcer of left heel and midfoot with unspecified severity: Secondary | ICD-10-CM | POA: Diagnosis present

## 2015-12-17 DIAGNOSIS — Z8546 Personal history of malignant neoplasm of prostate: Secondary | ICD-10-CM | POA: Diagnosis not present

## 2015-12-17 DIAGNOSIS — N186 End stage renal disease: Secondary | ICD-10-CM | POA: Diagnosis not present

## 2015-12-17 DIAGNOSIS — I70212 Atherosclerosis of native arteries of extremities with intermittent claudication, left leg: Secondary | ICD-10-CM

## 2015-12-17 DIAGNOSIS — I480 Paroxysmal atrial fibrillation: Secondary | ICD-10-CM | POA: Diagnosis not present

## 2015-12-17 DIAGNOSIS — I252 Old myocardial infarction: Secondary | ICD-10-CM | POA: Diagnosis not present

## 2015-12-17 DIAGNOSIS — Z833 Family history of diabetes mellitus: Secondary | ICD-10-CM | POA: Diagnosis not present

## 2015-12-17 DIAGNOSIS — R791 Abnormal coagulation profile: Secondary | ICD-10-CM | POA: Diagnosis present

## 2015-12-17 DIAGNOSIS — I5022 Chronic systolic (congestive) heart failure: Secondary | ICD-10-CM | POA: Diagnosis not present

## 2015-12-17 DIAGNOSIS — Z87891 Personal history of nicotine dependence: Secondary | ICD-10-CM | POA: Diagnosis not present

## 2015-12-17 DIAGNOSIS — R4182 Altered mental status, unspecified: Secondary | ICD-10-CM | POA: Diagnosis not present

## 2015-12-17 DIAGNOSIS — Z955 Presence of coronary angioplasty implant and graft: Secondary | ICD-10-CM | POA: Diagnosis not present

## 2015-12-17 DIAGNOSIS — L89520 Pressure ulcer of left ankle, unstageable: Secondary | ICD-10-CM | POA: Diagnosis present

## 2015-12-17 DIAGNOSIS — I998 Other disorder of circulatory system: Secondary | ICD-10-CM | POA: Diagnosis not present

## 2015-12-17 DIAGNOSIS — L89210 Pressure ulcer of right hip, unstageable: Secondary | ICD-10-CM | POA: Diagnosis present

## 2015-12-17 DIAGNOSIS — R296 Repeated falls: Secondary | ICD-10-CM | POA: Diagnosis present

## 2015-12-17 DIAGNOSIS — Z95810 Presence of automatic (implantable) cardiac defibrillator: Secondary | ICD-10-CM | POA: Diagnosis not present

## 2015-12-17 DIAGNOSIS — Z96651 Presence of right artificial knee joint: Secondary | ICD-10-CM | POA: Diagnosis present

## 2015-12-17 DIAGNOSIS — L89529 Pressure ulcer of left ankle, unspecified stage: Secondary | ICD-10-CM | POA: Diagnosis present

## 2015-12-17 DIAGNOSIS — F329 Major depressive disorder, single episode, unspecified: Secondary | ICD-10-CM | POA: Diagnosis present

## 2015-12-17 DIAGNOSIS — Z7901 Long term (current) use of anticoagulants: Secondary | ICD-10-CM | POA: Diagnosis not present

## 2015-12-17 DIAGNOSIS — E785 Hyperlipidemia, unspecified: Secondary | ICD-10-CM | POA: Diagnosis present

## 2015-12-17 DIAGNOSIS — E039 Hypothyroidism, unspecified: Secondary | ICD-10-CM | POA: Diagnosis present

## 2015-12-17 DIAGNOSIS — D638 Anemia in other chronic diseases classified elsewhere: Secondary | ICD-10-CM | POA: Diagnosis present

## 2015-12-17 DIAGNOSIS — I951 Orthostatic hypotension: Secondary | ICD-10-CM | POA: Diagnosis present

## 2015-12-17 DIAGNOSIS — I2510 Atherosclerotic heart disease of native coronary artery without angina pectoris: Secondary | ICD-10-CM | POA: Diagnosis present

## 2015-12-17 DIAGNOSIS — Z992 Dependence on renal dialysis: Secondary | ICD-10-CM | POA: Diagnosis not present

## 2015-12-17 DIAGNOSIS — I255 Ischemic cardiomyopathy: Secondary | ICD-10-CM | POA: Diagnosis not present

## 2015-12-17 HISTORY — PX: PERIPHERAL VASCULAR CATHETERIZATION: SHX172C

## 2015-12-17 LAB — RENAL FUNCTION PANEL
Albumin: 2 g/dL — ABNORMAL LOW (ref 3.5–5.0)
Anion gap: 14 (ref 5–15)
BUN: 19 mg/dL (ref 6–20)
CHLORIDE: 97 mmol/L — AB (ref 101–111)
CO2: 28 mmol/L (ref 22–32)
CREATININE: 4.36 mg/dL — AB (ref 0.61–1.24)
Calcium: 8.2 mg/dL — ABNORMAL LOW (ref 8.9–10.3)
GFR calc Af Amer: 13 mL/min — ABNORMAL LOW (ref >60)
GFR calc non Af Amer: 11 mL/min — ABNORMAL LOW (ref >60)
Glucose, Bld: 78 mg/dL (ref 65–99)
Phosphorus: 2.8 mg/dL (ref 2.5–4.6)
Potassium: 3.7 mmol/L (ref 3.5–5.1)
Sodium: 139 mmol/L (ref 135–145)

## 2015-12-17 LAB — CBC
HEMATOCRIT: 36.3 % — AB (ref 39.0–52.0)
Hemoglobin: 11.2 g/dL — ABNORMAL LOW (ref 13.0–17.0)
MCH: 26.4 pg (ref 26.0–34.0)
MCHC: 30.9 g/dL (ref 30.0–36.0)
MCV: 85.6 fL (ref 78.0–100.0)
PLATELETS: 218 10*3/uL (ref 150–400)
RBC: 4.24 MIL/uL (ref 4.22–5.81)
RDW: 20 % — AB (ref 11.5–15.5)
WBC: 8.3 10*3/uL (ref 4.0–10.5)

## 2015-12-17 LAB — PROTIME-INR
INR: 2.29 — AB (ref 0.00–1.49)
INR: 1.88 — AB (ref 0.00–1.49)
PROTHROMBIN TIME: 25 s — AB (ref 11.6–15.2)
PROTHROMBIN TIME: 21.5 s — AB (ref 11.6–15.2)

## 2015-12-17 LAB — POCT ACTIVATED CLOTTING TIME: Activated Clotting Time: 301 seconds

## 2015-12-17 SURGERY — LOWER EXTREMITY INTERVENTION
Anesthesia: LOCAL

## 2015-12-17 SURGERY — LOWER EXTREMITY ANGIOGRAPHY
Anesthesia: LOCAL

## 2015-12-17 MED ORDER — HEPARIN SODIUM (PORCINE) 1000 UNIT/ML DIALYSIS: 1000.0000 [IU] | INTRAMUSCULAR | Status: DC | PRN

## 2015-12-17 MED ORDER — HEPARIN (PORCINE) IN NACL 2-0.9 UNIT/ML-% IJ SOLN
INTRAMUSCULAR | Status: DC | PRN
  Administered 2015-12-17: 1000 mL

## 2015-12-17 MED ORDER — NITROGLYCERIN 1 MG/10 ML FOR IR/CATH LAB
INTRA_ARTERIAL | Status: DC | PRN
  Administered 2015-12-17: 300 ug via INTRA_ARTERIAL

## 2015-12-17 MED ORDER — CLOPIDOGREL BISULFATE 75 MG PO TABS
75.0000 mg | ORAL_TABLET | Freq: Every day | ORAL | Status: DC
  Administered 2015-12-18: 75 mg via ORAL
  Filled 2015-12-17 (×2): qty 1

## 2015-12-17 MED ORDER — CLOPIDOGREL BISULFATE 300 MG PO TABS
ORAL_TABLET | ORAL | Status: AC
  Filled 2015-12-17: qty 2

## 2015-12-17 MED ORDER — ALTEPLASE 2 MG IJ SOLR: 2.0000 mg | Freq: Once | INTRAMUSCULAR | Status: DC | PRN

## 2015-12-17 MED ORDER — IODIXANOL 320 MG/ML IV SOLN
INTRAVENOUS | Status: DC | PRN
  Administered 2015-12-17: 240 mL via INTRA_ARTERIAL

## 2015-12-17 MED ORDER — SODIUM CHLORIDE 0.9 % IV SOLN: 100.0000 mL | INTRAVENOUS | Status: DC | PRN

## 2015-12-17 MED ORDER — CLOPIDOGREL BISULFATE 300 MG PO TABS
ORAL_TABLET | ORAL | Status: DC | PRN
  Administered 2015-12-17: 600 mg via ORAL

## 2015-12-17 MED ORDER — LIDOCAINE-PRILOCAINE 2.5-2.5 % EX CREA: 1.0000 "application " | TOPICAL_CREAM | CUTANEOUS | Status: DC | PRN

## 2015-12-17 MED ORDER — SODIUM CHLORIDE 0.9% FLUSH: 3.0000 mL | INTRAVENOUS | Status: DC | PRN

## 2015-12-17 MED ORDER — NITROGLYCERIN 1 MG/10 ML FOR IR/CATH LAB
INTRA_ARTERIAL | Status: AC
  Filled 2015-12-17: qty 10

## 2015-12-17 MED ORDER — HEPARIN (PORCINE) IN NACL 2-0.9 UNIT/ML-% IJ SOLN
INTRAMUSCULAR | Status: AC
  Filled 2015-12-17: qty 1000

## 2015-12-17 MED ORDER — FENTANYL CITRATE (PF) 100 MCG/2ML IJ SOLN
INTRAMUSCULAR | Status: AC
  Filled 2015-12-17: qty 2

## 2015-12-17 MED ORDER — PENTAFLUOROPROP-TETRAFLUOROETH EX AERO: 1.0000 "application " | INHALATION_SPRAY | CUTANEOUS | Status: DC | PRN

## 2015-12-17 MED ORDER — LIDOCAINE HCL (PF) 1 % IJ SOLN
INTRAMUSCULAR | Status: AC
  Filled 2015-12-17: qty 30

## 2015-12-17 MED ORDER — HEPARIN SODIUM (PORCINE) 1000 UNIT/ML IJ SOLN
INTRAMUSCULAR | Status: DC | PRN
  Administered 2015-12-17: 5000 [IU] via INTRAVENOUS

## 2015-12-17 MED ORDER — SODIUM CHLORIDE 0.9 % IV SOLN: 250.0000 mL | INTRAVENOUS | Status: DC | PRN

## 2015-12-17 MED ORDER — SODIUM CHLORIDE 0.9% FLUSH: 3.0000 mL | Freq: Two times a day (BID) | INTRAVENOUS | Status: DC

## 2015-12-17 MED ORDER — FENTANYL CITRATE (PF) 100 MCG/2ML IJ SOLN
INTRAMUSCULAR | Status: DC | PRN
  Administered 2015-12-17: 50 ug via INTRAVENOUS

## 2015-12-17 MED ORDER — LIDOCAINE HCL (PF) 1 % IJ SOLN: 5.0000 mL | INTRAMUSCULAR | Status: DC | PRN

## 2015-12-17 MED ORDER — LIDOCAINE HCL (PF) 1 % IJ SOLN
INTRAMUSCULAR | Status: DC | PRN
  Administered 2015-12-17: 20 mL via SUBCUTANEOUS

## 2015-12-17 SURGICAL SUPPLY — 24 items
BALLOON ARMADA 2.0X20X150 (BALLOONS) ×1 IMPLANT
CATH ANGIO 5F PIGTAIL 65CM (CATHETERS) ×1 IMPLANT
CATH CROSS OVER TEMPO 5F (CATHETERS) ×1 IMPLANT
CATH CXI SUPP ANG 2.6FR 150CM (MICROCATHETER) ×1 IMPLANT
CATH STRAIGHT 5FR 65CM (CATHETERS) ×1 IMPLANT
CATH TEMPO AQUA 5F 100CM (CATHETERS) ×1 IMPLANT
DEVICE CLOSURE MYNXGRIP 6/7F (Vascular Products) ×1 IMPLANT
KIT ENCORE 26 ADVANTAGE (KITS) ×1 IMPLANT
KIT MICROINTRODUCER STIFF 5F (SHEATH) ×1 IMPLANT
KIT PV (KITS) ×3 IMPLANT
SHEATH PINNACLE 5F 10CM (SHEATH) ×1 IMPLANT
SHEATH PINNACLE 6F 10CM (SHEATH) ×1 IMPLANT
SHEATH PINNACLE ST 6F 65CM (SHEATH) ×1 IMPLANT
STENT XIENCE ALPINE RX 2.5X28 (Permanent Stent) ×1 IMPLANT
STOPCOCK MORSE 400PSI 3WAY (MISCELLANEOUS) ×1 IMPLANT
SYRINGE MEDRAD AVANTA MACH 7 (SYRINGE) ×1 IMPLANT
TAPE RADIOPAQUE TURBO (MISCELLANEOUS) ×1 IMPLANT
TRANSDUCER W/STOPCOCK (MISCELLANEOUS) ×3 IMPLANT
TRAY PV CATH (CUSTOM PROCEDURE TRAY) ×3 IMPLANT
TUBING CIL FLEX 10 FLL-RA (TUBING) ×1 IMPLANT
WIRE HITORQ VERSACORE ST 145CM (WIRE) ×1 IMPLANT
WIRE RUNTHROUGH .014X300CM (WIRE) ×1 IMPLANT
WIRE TORQFLEX AUST .018X40CM (WIRE) ×1 IMPLANT
WIRE VERSACORE LOC 115CM (WIRE) ×1 IMPLANT

## 2015-12-17 NOTE — Progress Notes (Addendum)
  [facility] Progress Note   Subjective: no compliants  Filed Vitals:   12/17/15 0834 12/17/15 0846 12/17/15 0900 12/17/15 0927  BP: 118/56 116/57 119/56 117/58  Pulse: 71 70 70 70  Temp: 98.2 F (36.8 C)     TempSrc: Oral     Resp: 18 22 22 18   Height:      Weight: 72.1 kg (158 lb 15.2 oz)     SpO2: 98%       Inpatient medications: . allopurinol  100 mg Oral Daily  . amiodarone  200 mg Oral Daily  . aspirin EC  81 mg Oral Daily  . atorvastatin  40 mg Oral Daily  . carvedilol  3.125 mg Oral BID WC  . collagenase  1 application Topical Daily  . [START ON 12/24/2015] darbepoetin (ARANESP) injection - DIALYSIS  200 mcg Intravenous Q Wed-HD  . DULoxetine  30 mg Oral Daily  . midodrine  10 mg Oral Q M,W,F-HD  . multivitamin with minerals  1 tablet Oral Daily  . silver sulfADIAZINE  1 application Topical Daily  . Warfarin - Pharmacist Dosing Inpatient   Does not apply q1800     sodium chloride, sodium chloride, acetaminophen, alteplase, heparin, lidocaine (PF), lidocaine-prilocaine, pentafluoroprop-tetrafluoroeth, sodium chloride flush  Exam: Alert no distress on HD No jvd Chest clear bilat RRR no MRG Abd soft ntnd no mass or ascites Ext L post lat heel ulcer slightly wet 4 x 4 cm No LE edema Neuro alert nf ox 3 LUA AVF +bruit  [facility]ialysis: [facility] MWF  4h  71.5kg  2/2.25 bath  Hep none  LUA AVF Hect 5 ug tiw Mircera 200 ug last 2/12 Venofer 50/ hd Hb 10/ Ca 8.8  / P 2.9 / pth 177      Assessment: 1 L foot/ heel ulcer - awaiting angiogram LLE per card service 2 ESRD HD MWF 3 Vol at dry wt 4 Afib on amio/ coreg / coumadin 5 ICM / ICD/ CAD hx stent 6 Chron hypotension on midodrine 7 MBD of CKD - vit D, no binder as op 8 Hx GIB - no hep HD  Plan - HD today, min UF   Kelly Splinter MD [facility] pager [phone]    cell [phone] 12/17/2015, 10:07 AM    Recent Labs Lab 12/16/15 0533 12/16/15 0822 12/17/15 0856  NA 143 142 139  K  4.1 4.2 3.7  CL 101 103 97*  CO2 29 28 28   GLUCOSE 78 79 78  BUN 42* 43* 19  CREATININE 6.74* 6.86* 4.36*  CALCIUM 9.0 8.7* 8.2*  PHOS  --  3.2 2.8    Recent Labs Lab 12/15/15 1634 12/16/15 0822 12/17/15 0856  AST 54*  --   --   ALT 38  --   --   ALKPHOS 186*  --   --   BILITOT 1.4*  --   --   PROT 6.4*  --   --   ALBUMIN 2.3* 2.1* 2.0*    Recent Labs Lab 12/15/15 1634 12/16/15 0533  WBC 9.2 7.6  NEUTROABS 6.1  --   HGB 10.7* 10.2*  HCT 34.2* 31.9*  MCV 84.0 86.4  PLT 212 225

## 2015-12-17 NOTE — Progress Notes (Signed)
PT Cancellation Note  Patient Details Name: [name] MRN: [id number] DOB: November 20, 1929   Cancelled Treatment:    Reason Eval/Treat Not Completed: Patient at procedure or test/unavailable (Pt off floor at HD.)  PT will continue to follow acutely and will attempt to see pt again this afternoon, schedule allowing.  Joslyn Hy PT, DPT [phone] Pager: [phone] 12/17/2015, 8:59 AM

## 2015-12-17 NOTE — Progress Notes (Signed)
Patient Name: [name] Date of Encounter: 12/17/2015  Principal Problem:   Ischemia of extremity Active Problems:   Chronic systolic heart failure (HCC)   Automatic implantable cardioverter-defibrillator in situ   End stage renal disease (HCC)   Supratherapeutic INR   Cardiomyopathy, ischemic   PAD (peripheral artery disease) (HCC)   PAF (paroxysmal atrial fibrillation) ([place])   Essential hypertension   History of GI bleed   Frequent falls   Lower limb ischemia   Pressure ulcer   Length of Stay: 2  SUBJECTIVE  Seen in HD. No complaints. Hoping he can get the procedure today.   CURRENT MEDS . allopurinol  100 mg Oral Daily  . amiodarone  200 mg Oral Daily  . aspirin EC  81 mg Oral Daily  . atorvastatin  40 mg Oral Daily  . carvedilol  3.125 mg Oral BID WC  . collagenase  1 application Topical Daily  . [START ON 12/24/2015] darbepoetin (ARANESP) injection - DIALYSIS  200 mcg Intravenous Q Wed-HD  . DULoxetine  30 mg Oral Daily  . midodrine  10 mg Oral Q M,W,F-HD  . multivitamin with minerals  1 tablet Oral Daily  . silver sulfADIAZINE  1 application Topical Daily  . Warfarin - Pharmacist Dosing Inpatient   Does not apply q1800    OBJECTIVE  Filed Vitals:   12/16/15 1150 12/16/15 1243 12/16/15 2009 12/17/15 0429  BP: 130/53 132/49 129/43   Pulse: 70 69 70   Temp: 98.1 F (36.7 C) 98 F (36.7 C) 98.3 F (36.8 C) 98.6 F (37 C)  TempSrc: Oral Oral Oral Oral  Resp: 18 15 14    Height:      Weight: 156 lb 15.5 oz (71.2 kg)   158 lb 1.1 oz (71.7 kg)  SpO2: 98% 100% 99%     Intake/Output Summary (Last 24 hours) at 12/17/15 0842 Last data filed at 12/16/15 1530  Gross per 24 hour  Intake    390 ml  Output      0 ml  Net    390 ml   Filed Weights   12/16/15 0750 12/16/15 1150 12/17/15 0429  Weight: 156 lb 8.4 oz (71 kg) 156 lb 15.5 oz (71.2 kg) 158 lb 1.1 oz (71.7 kg)    PHYSICAL EXAM  General: Pleasant, NAD. Neuro: Alert and oriented X 3. Moves  all extremities spontaneously. Psych: Normal affect. HEENT:  Normal  Neck: Supple without bruits or JVD. Lungs:  Resp regular and unlabored, CTA. Heart: RRR no s3, s4, or murmurs. Abdomen: Soft, non-tender, non-distended, BS + x 4.  Extremities: No clubbing, cyanosis or edema. DP/PT/Radials 2+ and equal bilaterally.  Accessory Clinical Findings  CBC  Recent Labs  12/15/15 1634 12/16/15 0533  WBC 9.2 7.6  NEUTROABS 6.1  --   HGB 10.7* 10.2*  HCT 34.2* 31.9*  MCV 84.0 86.4  PLT 212 123456   Basic Metabolic Panel  Recent Labs  12/16/15 0533 12/16/15 0822  NA 143 142  K 4.1 4.2  CL 101 103  CO2 29 28  GLUCOSE 78 79  BUN 42* 43*  CREATININE 6.74* 6.86*  CALCIUM 9.0 8.7*  PHOS  --  3.2   Liver Function Tests  Recent Labs  12/15/15 1634 12/16/15 0822  AST 54*  --   ALT 38  --   ALKPHOS 186*  --   BILITOT 1.4*  --   PROT 6.4*  --   ALBUMIN 2.3* 2.1*    Recent Labs  12/16/15 0533  CHOL 75  HDL 24*  LDLCALC 41  TRIG 50  CHOLHDL 3.1   Thyroid Function Tests  Recent Labs  12/15/15 1634  TSH 1.257   Radiology/Studies  Dg Foot 2 Views Left  12/07/2015  2 views of a skeletally mature individual were obtained of the left foot. Study includes lateral and calcaneal axial. Calcaneal spurring is present. Vessel calcifications also present. There is no definitive evidence of acute fracture or stress fracture. No cortical destruction suggest osteomyelitis at this time.   ASSESSMENT AND PLAN  1. PAD with ischemia of limb, awaiting PV angiogram - The patient will be admitted for further monitoring of his INR given his complex PMH. 5mg  oral vitamin K was given x2. INR today 2.29. Plan is for him to go to hemodialysis today with a repeat INR this afternoon. If his INR is under 2 he will proceed with PV angiography with Dr. Fletcher Anon.  2. Paroxysmal atrial fibrillation on chronic Coumadin - as above, Coumadin placed on hold for PV angio. CHADSVASC 5. Per discussion with  Dr. Gwenlyn Found, stop digoxin given his ESRD.   3. ESRD on HD - in HD today, MWF. He will get dialyzed today.  4. Chronic systolic CHF/ICM s/p BiV-ICD - euvolemic. Volume management per HD.  5. Essential HTN - controlled.  6. H/o GIB 03/2015 - no evidence of recurrence. H/H 10.2/31.9.  7. Recent frequent falls - PT eval as above.   Signed, Eileen Stanford MD, [facility] 12/17/2015  The patient was seen, examined and discussed with Nell Range, PA-C and I agree with the above.   INR today 2.29, he is undergoing HD now, INR will be rechecked after the HD< he has received total of 10 mg of PO vitamin K.  Dr Fletcher Anon to decide about angiography based on INR.   Dorothy Spark, MD 12/17/2015

## 2015-12-17 NOTE — Interval H&P Note (Signed)
History and Physical Interval Note:  12/17/2015 3:05 PM  [name]  has presented today for surgery, with the diagnosis of critical ischemia  The various methods of treatment have been discussed with the patient and family. After consideration of risks, benefits and other options for treatment, the patient has consented to  Procedure(s): Lower Extremity Intervention (N/A) as a surgical intervention .  The patient's history has been reviewed, patient examined, no change in status, stable for surgery.  I have reviewed the patient's chart and labs.  Questions were answered to the patient's satisfaction.     Kathlyn [place]

## 2015-12-17 NOTE — Progress Notes (Signed)
PT Cancellation Note  Patient Details Name: DECORIUS OUTWATER MRN: [id number] DOB: 23-Feb-1930   Cancelled Treatment:    Reason Eval/Treat Not Completed: Patient at procedure or test/unavailable (pt off floor in cath lab). PT will continue to follow acutely.  Joslyn Hy PT, DPT [phone] Pager: [phone] 12/17/2015, 2:52 PM

## 2015-12-17 NOTE — Progress Notes (Signed)
ANTICOAGULATION CONSULT NOTE  Pharmacy Consult for warfarin Indication: atrial fibrillation  No Known Allergies  Patient Measurements: Height: 5\' 11"  (180.3 cm) Weight: 158 lb 1.1 oz (71.7 kg) IBW/kg (Calculated) : 75.3  Vital Signs: Temp: 98.6 F (37 C) (02/22 0429) Temp Source: Oral (02/22 0429)  Labs:  Recent Labs  12/15/15 0919 12/15/15 1634 12/16/15 0533 12/16/15 0822 12/17/15 0541  HGB  --  10.7* 10.2*  --   --   HCT  --  34.2* 31.9*  --   --   PLT  --  212 225  --   --   LABPROT 44.2*  --  35.1*  --  25.0*  INR 4.88*  --  3.60*  --  2.29*  CREATININE  --  6.18* 6.74* 6.86*  --     Estimated Creatinine Clearance: 8.3 mL/min (by C-G formula based on Cr of 6.86).  Assessment: 25 YOM with AFib here for PV angiogram- hx of PAD with ischemia of limb. INR elevated on admission, received vitamin K 5mg  po x1 2/10, another dose of 5mg  PO ordered 2/21. INR today= 2.29   Goal of Therapy:  INR 2-3 Monitor platelets by anticoagulation protocol: Yes   Plan:  -holding coumadin for PV angiogram -Daily PT/INR  Hildred Laser, Pharm D 12/17/2015 8:29 AM

## 2015-12-17 NOTE — Progress Notes (Signed)
Manual pressure held for 20 mins after Mink Closure device was not successful.  Right groin level 0  Pt denis any discomfort at this time.  VS remain stable.  Bed rest started at  1730p

## 2015-12-17 NOTE — H&P (View-Only) (Signed)
Patient Name: [name] Date of Encounter: 12/17/2015  Principal Problem:   Ischemia of extremity Active Problems:   Chronic systolic heart failure (HCC)   Automatic implantable cardioverter-defibrillator in situ   End stage renal disease (HCC)   Supratherapeutic INR   Cardiomyopathy, ischemic   PAD (peripheral artery disease) (HCC)   PAF (paroxysmal atrial fibrillation) ([place])   Essential hypertension   History of GI bleed   Frequent falls   Lower limb ischemia   Pressure ulcer   Length of Stay: 2  SUBJECTIVE  Seen in HD. No complaints. Hoping he can get the procedure today.   CURRENT MEDS . allopurinol  100 mg Oral Daily  . amiodarone  200 mg Oral Daily  . aspirin EC  81 mg Oral Daily  . atorvastatin  40 mg Oral Daily  . carvedilol  3.125 mg Oral BID WC  . collagenase  1 application Topical Daily  . [START ON 12/24/2015] darbepoetin (ARANESP) injection - DIALYSIS  200 mcg Intravenous Q Wed-HD  . DULoxetine  30 mg Oral Daily  . midodrine  10 mg Oral Q M,W,F-HD  . multivitamin with minerals  1 tablet Oral Daily  . silver sulfADIAZINE  1 application Topical Daily  . Warfarin - Pharmacist Dosing Inpatient   Does not apply q1800    OBJECTIVE  Filed Vitals:   12/16/15 1150 12/16/15 1243 12/16/15 2009 12/17/15 0429  BP: 130/53 132/49 129/43   Pulse: 70 69 70   Temp: 98.1 F (36.7 C) 98 F (36.7 C) 98.3 F (36.8 C) 98.6 F (37 C)  TempSrc: Oral Oral Oral Oral  Resp: 18 15 14    Height:      Weight: 156 lb 15.5 oz (71.2 kg)   158 lb 1.1 oz (71.7 kg)  SpO2: 98% 100% 99%     Intake/Output Summary (Last 24 hours) at 12/17/15 0842 Last data filed at 12/16/15 1530  Gross per 24 hour  Intake    390 ml  Output      0 ml  Net    390 ml   Filed Weights   12/16/15 0750 12/16/15 1150 12/17/15 0429  Weight: 156 lb 8.4 oz (71 kg) 156 lb 15.5 oz (71.2 kg) 158 lb 1.1 oz (71.7 kg)    PHYSICAL EXAM  General: Pleasant, NAD. Neuro: Alert and oriented X 3. Moves  all extremities spontaneously. Psych: Normal affect. HEENT:  Normal  Neck: Supple without bruits or JVD. Lungs:  Resp regular and unlabored, CTA. Heart: RRR no s3, s4, or murmurs. Abdomen: Soft, non-tender, non-distended, BS + x 4.  Extremities: No clubbing, cyanosis or edema. DP/PT/Radials 2+ and equal bilaterally.  Accessory Clinical Findings  CBC  Recent Labs  12/15/15 1634 12/16/15 0533  WBC 9.2 7.6  NEUTROABS 6.1  --   HGB 10.7* 10.2*  HCT 34.2* 31.9*  MCV 84.0 86.4  PLT 212 123456   Basic Metabolic Panel  Recent Labs  12/16/15 0533 12/16/15 0822  NA 143 142  K 4.1 4.2  CL 101 103  CO2 29 28  GLUCOSE 78 79  BUN 42* 43*  CREATININE 6.74* 6.86*  CALCIUM 9.0 8.7*  PHOS  --  3.2   Liver Function Tests  Recent Labs  12/15/15 1634 12/16/15 0822  AST 54*  --   ALT 38  --   ALKPHOS 186*  --   BILITOT 1.4*  --   PROT 6.4*  --   ALBUMIN 2.3* 2.1*    Recent Labs  12/16/15 0533  CHOL 75  HDL 24*  LDLCALC 41  TRIG 50  CHOLHDL 3.1   Thyroid Function Tests  Recent Labs  12/15/15 1634  TSH 1.257   Radiology/Studies  Dg Foot 2 Views Left  12/07/2015  2 views of a skeletally mature individual were obtained of the left foot. Study includes lateral and calcaneal axial. Calcaneal spurring is present. Vessel calcifications also present. There is no definitive evidence of acute fracture or stress fracture. No cortical destruction suggest osteomyelitis at this time.   ASSESSMENT AND PLAN  1. PAD with ischemia of limb, awaiting PV angiogram - The patient will be admitted for further monitoring of his INR given his complex PMH. 5mg  oral vitamin K was given x2. INR today 2.29. Plan is for him to go to hemodialysis today with a repeat INR this afternoon. If his INR is under 2 he will proceed with PV angiography with Dr. Fletcher Anon.  2. Paroxysmal atrial fibrillation on chronic Coumadin - as above, Coumadin placed on hold for PV angio. CHADSVASC 5. Per discussion with  Dr. Gwenlyn Found, stop digoxin given his ESRD.   3. ESRD on HD - in HD today, MWF. He will get dialyzed today.  4. Chronic systolic CHF/ICM s/p BiV-ICD - euvolemic. Volume management per HD.  5. Essential HTN - controlled.  6. H/o GIB 03/2015 - no evidence of recurrence. H/H 10.2/31.9.  7. Recent frequent falls - PT eval as above.   Signed, Eileen Stanford MD, [facility] 12/17/2015  The patient was seen, examined and discussed with Nell Range, PA-C and I agree with the above.   INR today 2.29, he is undergoing HD now, INR will be rechecked after the HD< he has received total of 10 mg of PO vitamin K.  Dr Fletcher Anon to decide about angiography based on INR.   Dorothy Spark, MD 12/17/2015

## 2015-12-17 NOTE — Progress Notes (Signed)
Pt returned from procedure. Disoriented to place and situation. He is refusing to allow nursing staff to connect him to the bedside monitor to monitor heart rate and vital signs. He expresses a desire to go home immediately. Wife was notified and spoke with patient, patient remains uncooperative. PA, [employer] notified.

## 2015-12-17 NOTE — Progress Notes (Signed)
Pt disoriented to place and situation, politely refusing to allow staff to connect him to bedside monitor or check vital signs. Will continue to assess.

## 2015-12-18 ENCOUNTER — Encounter (HOSPITAL_COMMUNITY): Payer: Self-pay | Admitting: Cardiovascular Disease

## 2015-12-18 ENCOUNTER — Inpatient Hospital Stay (HOSPITAL_COMMUNITY): Payer: Medicare Other

## 2015-12-18 DIAGNOSIS — R4182 Altered mental status, unspecified: Secondary | ICD-10-CM

## 2015-12-18 DIAGNOSIS — F99 Mental disorder, not otherwise specified: Secondary | ICD-10-CM | POA: Insufficient documentation

## 2015-12-18 LAB — BASIC METABOLIC PANEL
Anion gap: 11 (ref 5–15)
BUN: 22 mg/dL — AB (ref 6–20)
CHLORIDE: 102 mmol/L (ref 101–111)
CO2: 28 mmol/L (ref 22–32)
CREATININE: 3.63 mg/dL — AB (ref 0.61–1.24)
Calcium: 8.4 mg/dL — ABNORMAL LOW (ref 8.9–10.3)
GFR calc Af Amer: 16 mL/min — ABNORMAL LOW (ref >60)
GFR calc non Af Amer: 14 mL/min — ABNORMAL LOW (ref >60)
GLUCOSE: 82 mg/dL (ref 65–99)
Potassium: 4.5 mmol/L (ref 3.5–5.1)
Sodium: 141 mmol/L (ref 135–145)

## 2015-12-18 LAB — PROTIME-INR
INR: 1.82 — AB (ref 0.00–1.49)
PROTHROMBIN TIME: 21 s — AB (ref 11.6–15.2)

## 2015-12-18 MED ORDER — WARFARIN SODIUM 2 MG PO TABS
3.0000 mg | ORAL_TABLET | Freq: Once | ORAL | Status: DC
  Filled 2015-12-18: qty 1

## 2015-12-18 NOTE — Care Management Important Message (Signed)
Important Message  Patient Details  Name: [name] MRN: [id number] Date of Birth: January 29, 1930   Medicare Important Message Given:  Yes    [name] May 12/18/2015, 1:56 PM

## 2015-12-18 NOTE — Progress Notes (Signed)
EEG Completed; Results Pending  

## 2015-12-18 NOTE — Procedures (Signed)
ELECTROENCEPHALOGRAM REPORT  Date of Study: 12/18/2015  Patient's Name: [name] MRN: [id number] Date of Birth: [birth year]  Referring Provider: Dr. Angelena Form  Clinical History: This is an 82 year old man with altered mental status.  Medications: acetaminophen (TYLENOL) tablet 650 mg allopurinol (ZYLOPRIM) tablet 100 mg amiodarone (PACERONE) tablet 200 mg atorvastatin (LIPITOR) tablet 40 mg carvedilol (COREG) tablet 3.125 mg clopidogrel (PLAVIX) tablet 75 mg collagenase (SANTYL) ointment 1 application Darbepoetin Alfa (ARANESP) injection 200 mcg DULoxetine (CYMBALTA) DR capsule 30 mg midodrine (PROAMATINE) tablet 10 mg multivitamin with minerals tablet 1 tablet warfarin (COUMADIN) tablet 3 mg  Technical Summary: A multichannel digital EEG recording measured by the international 10-20 system with electrodes applied with paste and impedances below 5000 ohms performed as portable with EKG monitoring in an awake and asleep patient.  Hyperventilation and photic stimulation were not performed.  The digital EEG was referentially recorded, reformatted, and digitally filtered in a variety of bipolar and referential montages for optimal display.   Description: The patient is awake and asleep during the recording.  During maximal wakefulness, there is a symmetric, medium voltage 8 Hz posterior dominant rhythm that poorly attenuates with eye opening and eye closure This is admixed with a small amount of diffuse 4-5 Hz theta and 2-3 Hz delta slowing of the waking background.  During drowsiness and stage I sleep, there is an increase in theta and delta slowing of the background, with poorly formed vertex waves seen.  Hyperventilation and photic stimulation were not performed.  There were no epileptiform discharges or electrographic seizures seen.   EKG lead was unremarkable.  Impression: This awake and asleep EEG is abnormal due to mild diffuse slowing of the waking  background.  Clinical Correlation of the above findings indicates diffuse cerebral dysfunction that is non-specific in etiology and can be seen with hypoxic/ischemic injury, toxic/metabolic encephalopathies, neurodegenerative disorders, or medication effect.  The absence of epileptiform discharges does not rule out a clinical diagnosis of epilepsy.  Clinical correlation is advised.   Ellouise Newer, M.D.

## 2015-12-18 NOTE — Care Management Note (Addendum)
Case Management Note  Patient Details  Name: [name] MRN: [id number] Date of Birth: Oct 13, 1930  Subjective/Objective:   Pt admitted for Ischemia of Extremity. Pt is from home with wife. CM had a chance to speak with wife in between test for pt. Per wife pt had recent falls in the home. Pt has a RW and is unsteady on his feet. PT to work with pt.                  Action/Plan:Pt may benefit from SNF at d/c. CM did really information to CSW. CM will continue to monitor for additional needs.   Expected Discharge Date:                  Expected Discharge Plan:  Skilled Nursing Facility  In-House Referral:  Clinical Social Work  Discharge planning Services  CM Consult  Post Acute Care Choice:    Choice offered to:     DME Arranged:    DME Agency:     HH Arranged:    [place] Agency:     Status of Service: Completed.  Medicare Important Message Given:  Yes Date Medicare IM Given:    Medicare IM give by:    Date Additional Medicare IM Given:    Additional Medicare Important Message give by:     If discussed at [place] of Stay Meetings, dates discussed:    Additional Comments: Plan for d/c to [facility] for SNF. CSW assisting with disposition needs. No further needs from CM at this time.   Bethena Roys, RN 12/18/2015, 3:42 PM

## 2015-12-18 NOTE — Care Management Obs Status (Signed)
[place] NOTIFICATION   Patient Details  Name: CARRY MCREA MRN: [id number] Date of Birth: Apr 20, 1930   Medicare Observation Status Notification Given:  Yes (Pt admiited as observaiton- in test unable to get to pt due to HD session- no family available. Wife available today to sign obs form. )    Graves-Bigelow, Ocie Cornfield, RN 12/18/2015, 1:19 PM

## 2015-12-18 NOTE — Evaluation (Signed)
Physical Therapy Evaluation Patient Details Name: [name] MRN: [id number] DOB: April 30, 1930 Today's Date: 12/18/2015   History of Present Illness  [name] is an 82 y/o M with history of CAD, HTN, HLD, ESRD on HD, paroxysmal atrial fib on amiodarone/Coumadin (NSR by interrogation 10/2015), ICM/chronic systolic CHF (s/p ICD with upgrade to [employer] BiV-ICD 07/2015 when it had reached ERI), GIB 03/2015 in setting of elevated INR, WCT felt to be atrial tachycardia 06/2015, anemia of chronic disease, hypothyroidism who presented to [facility] on 12/15/15 for planned PV angio for critical limb ischemia. Hospital course complicated by supratheraputic INR and now with confusion/agitation.  Clinical Impression  Patient presents with decreased mobility due to deficits listed in PT problem list below.  He will benefit from skilled PT in the acute setting to allow return home following potential need for SNF level rehab stay.  Difficult to evaluate mobility deficits accurately due to pt agitation and limited participation.  Will continue to assess d/c needs and update as needed.     Follow Up Recommendations SNF    Equipment Recommendations  None recommended by PT    Recommendations for Other Services       Precautions / Restrictions Precautions Precautions: Fall      Mobility  Bed Mobility Overal bed mobility: Needs Assistance Bed Mobility: Supine to Sit;Sit to Supine     Supine to sit: Mod assist Sit to supine: Mod assist   General bed mobility comments: assist to lift trunk, pt able to move legs off bed, assist to supine with positioning and lifting legs into bed  Transfers Overall transfer level: Needs assistance Equipment used: Rolling walker (2 wheeled) Transfers: Sit to/from Stand Sit to Stand: Mod assist;+2 physical assistance         General transfer comment: assist to stand x 2; attempted to encourage pt to ambulate then just to get to chair, but continued to refuse both  stating there was water on the floor (no water and wiped floor with towel multiple times to convince pt safe to walk,) and stating we were "jivving him" and not letting him refuse  Ambulation/Gait                Stairs            Wheelchair Mobility    Modified Rankin (Stroke Patients Only)       Balance Overall balance assessment: Needs assistance   Sitting balance-Leahy Scale: Fair Sitting balance - Comments: UE support at times in sitting, once leaned back, but more due to resistance than balance issues   Standing balance support: Bilateral upper extremity supported Standing balance-Leahy Scale: Poor Standing balance comment: unable to stand without UE support                             Pertinent Vitals/Pain Pain Assessment: Faces Pain Score: 0-No pain    Home Living Family/patient expects to be discharged to:: Private residence Living Arrangements: Spouse/significant other Available Help at Discharge: Family Type of Home: House Home Access: Stairs to enter Entrance Stairs-Rails: Right Entrance Stairs-Number of Steps: 7 Home Layout: Two level;Laundry or work area in [place]: Environmental consultant - 2 wheels;Bedside commode;Grab bars - tub/shower;Shower seat      Prior Function           Comments: unknown     Hand Dominance        Extremity/Trunk Assessment   Upper Extremity Assessment: RUE deficits/detail;LUE  deficits/detail RUE Deficits / Details: noted unable to lift shoulders more than about 30 degrees, noted arthritic changes in hands; did not fully assess due to agitation     LUE Deficits / Details: noted unable to lift shoulders more than about 30 degrees, noted arthritic changes in hands;  did not fully assess due to agitation   Lower Extremity Assessment: RLE deficits/detail;LLE deficits/detail RLE Deficits / Details: joints with signs of arthritis in knees and noted L knee held in flexion in supine; did not fully  assess due to agitation LLE Deficits / Details: joints with signs of arthritis in knees and noted L knee held in flexion in supine; did not fully assess due to agitation     Communication   Communication: No difficulties  Cognition Arousal/Alertness: Awake/alert Behavior During Therapy: Agitated Overall Cognitive Status: Impaired/Different from baseline Area of Impairment: Orientation;Following commands;Safety/judgement;Problem solving Orientation Level: Place   Memory: Decreased short-term memory Following Commands: Follows one step commands inconsistently Safety/Judgement: Decreased awareness of deficits;Decreased awareness of safety   Problem Solving: Decreased initiation;Requires verbal cues;Requires tactile cues General Comments: RN states pt knows he is in the hosptial, but thinks it is on his land; paraniod throughout eval thinking we were going to infringe on his right to refuse    General Comments      Exercises        Assessment/Plan    PT Assessment Patient needs continued PT services  PT Diagnosis Generalized weakness;Difficulty walking;Altered mental status   PT Problem List Decreased cognition;Decreased mobility;Decreased safety awareness;Decreased knowledge of use of DME;Decreased balance;Decreased strength  PT Treatment Interventions DME instruction;Balance training;Gait training;Functional mobility training;Patient/family education;Therapeutic exercise;Therapeutic activities   PT Goals (Current goals can be found in the Care Plan section) Acute Rehab PT Goals Patient Stated Goal: To walk out that door PT Goal Formulation: With patient Time For Goal Achievement: 12/25/15 Potential to Achieve Goals: [place] (depending on participation)    Frequency Min 3X/week   Barriers to discharge        Co-evaluation               End of Session Equipment Utilized During Treatment: Gait belt Activity Tolerance: Treatment limited secondary to agitation Patient  left: in bed;with call bell/phone within reach;with bed alarm set Nurse Communication: Other (comment) (nurse in the room to assist with pt due to agitation )         Time: [id number] PT Time Calculation (min) (ACUTE ONLY): 27 min   Charges:   PT Evaluation $PT Eval High Complexity: 1 Procedure PT Treatments $Therapeutic Activity: 8-22 mins   PT G CodesReginia Naas 01-11-2016, 3:26 PM  Magda Kiel, [place] 01-11-2016

## 2015-12-18 NOTE — Progress Notes (Signed)
[facility] Progress Note   Subjective: no compliants. Had stent placed in peroneal artery LLE yesterday.    Filed Vitals:   12/17/15 1725 12/17/15 1730 12/17/15 2306 12/18/15 0530  BP: 119/48 118/47 125/67 124/48  Pulse: 69 69 64 70  Temp:   98 F (36.7 C) 98.4 F (36.9 C)  TempSrc:   Oral Oral  Resp: 18 16 18 24   Height:      Weight:    71.5 kg (157 lb 10.1 oz)  SpO2: 93% 96% 98% 100%    Inpatient medications: . allopurinol  100 mg Oral Daily  . amiodarone  200 mg Oral Daily  . atorvastatin  40 mg Oral Daily  . carvedilol  3.125 mg Oral BID WC  . clopidogrel  75 mg Oral Q breakfast  . collagenase  1 application Topical Daily  . [START ON 12/24/2015] darbepoetin (ARANESP) injection - DIALYSIS  200 mcg Intravenous Q Wed-HD  . DULoxetine  30 mg Oral Daily  . midodrine  10 mg Oral Q M,W,F-HD  . multivitamin with minerals  1 tablet Oral Daily  . silver sulfADIAZINE  1 application Topical Daily  . sodium chloride flush  3 mL Intravenous Q12H  . warfarin  3 mg Oral ONCE-1800  . Warfarin - Pharmacist Dosing Inpatient   Does not apply q1800     sodium chloride, acetaminophen, sodium chloride flush, sodium chloride flush  Exam: Alert no distress, is a bit confused, calm No jvd Chest clear bilat RRR no MRG Abd soft ntnd no mass or ascites Ext L post lat heel ulcer slightly wet 4 x 4 cm No LE edema Neuro alert nf ox 3 LUA AVF +bruit  Dialysis: [facility] MWF  4h  71.5kg  2/2.25 bath  Hep none  LUA AVF Hect 5 ug tiw Mircera 200 ug last 2/12 Venofer 50/ hd Hb 10/ Ca 8.8  / P 2.9 / pth 177      Assessment: 1 L foot/ heel ulcer - s/p angiogram w DES to peroneal artery; on plavix and coumadin, plavix x 1 yr    2 ESRD HD MWF 3 Vol at dry wt 4 Afib on amio/ coreg / coumadin 5 ICM / ICD/ CAD hx stent 6 Chron hypotension on midodrine 7 MBD of CKD - vit D, no binder as op 8 Hx GIB - no hep HD 9 AMS - new issue  Plan - HD tomorrow , min UF   Kelly Splinter  MD [facility] pager [phone]    cell [phone] 12/18/2015, 1:41 PM    Recent Labs Lab 12/16/15 0822 12/17/15 0856 12/18/15 1200  NA 142 139 141  K 4.2 3.7 4.5  CL 103 97* 102  CO2 28 28 28   GLUCOSE 79 78 82  BUN 43* 19 22*  CREATININE 6.86* 4.36* 3.63*  CALCIUM 8.7* 8.2* 8.4*  PHOS 3.2 2.8  --     Recent Labs Lab 12/15/15 1634 12/16/15 0822 12/17/15 0856  AST 54*  --   --   ALT 38  --   --   ALKPHOS 186*  --   --   BILITOT 1.4*  --   --   PROT 6.4*  --   --   ALBUMIN 2.3* 2.1* 2.0*    Recent Labs Lab 12/15/15 1634 12/16/15 0533 12/17/15 1337  WBC 9.2 7.6 8.3  NEUTROABS 6.1  --   --   HGB 10.7* 10.2* 11.2*  HCT 34.2* 31.9* 36.3*  MCV 84.0 86.4 85.6  PLT 212 225 218

## 2015-12-18 NOTE — Progress Notes (Signed)
Patient Name: [name] Date of Encounter: 12/18/2015  Principal Problem:   Ischemia of extremity Active Problems:   Chronic systolic heart failure (HCC)   Automatic implantable cardioverter-defibrillator in situ   End stage renal disease (HCC)   Supratherapeutic INR   Cardiomyopathy, ischemic   PAD (peripheral artery disease) (HCC)   PAF (paroxysmal atrial fibrillation) ([place])   Essential hypertension   History of GI bleed   Frequent falls   Lower limb ischemia   Pressure ulcer   Length of Stay: 3  SUBJECTIVE  Confused and agitated. Wants to go home. Wife says this is very abnormal for him  CURRENT MEDS . allopurinol  100 mg Oral Daily  . amiodarone  200 mg Oral Daily  . atorvastatin  40 mg Oral Daily  . carvedilol  3.125 mg Oral BID WC  . clopidogrel  75 mg Oral Q breakfast  . collagenase  1 application Topical Daily  . [START ON 12/24/2015] darbepoetin (ARANESP) injection - DIALYSIS  200 mcg Intravenous Q Wed-HD  . DULoxetine  30 mg Oral Daily  . midodrine  10 mg Oral Q M,W,F-HD  . multivitamin with minerals  1 tablet Oral Daily  . silver sulfADIAZINE  1 application Topical Daily  . sodium chloride flush  3 mL Intravenous Q12H  . Warfarin - Pharmacist Dosing Inpatient   Does not apply q1800    OBJECTIVE  Filed Vitals:   12/17/15 1725 12/17/15 1730 12/17/15 2306 12/18/15 0530  BP: 119/48 118/47 125/67 124/48  Pulse: 69 69 64 70  Temp:   98 F (36.7 C) 98.4 F (36.9 C)  TempSrc:   Oral Oral  Resp: 18 16 18 24   Height:      Weight:    157 lb 10.1 oz (71.5 kg)  SpO2: 93% 96% 98% 100%    Intake/Output Summary (Last 24 hours) at 12/18/15 1058 Last data filed at 12/17/15 1216  Gross per 24 hour  Intake      0 ml  Output   1000 ml  Net  -1000 ml   Filed Weights   12/17/15 0429 12/17/15 0834 12/18/15 0530  Weight: 158 lb 1.1 oz (71.7 kg) 158 lb 15.2 oz (72.1 kg) 157 lb 10.1 oz (71.5 kg)    PHYSICAL EXAM  General: Pleasant, NAD. Confused and  agitated Neuro: Alert and oriented X 3. Moves all extremities spontaneously. Psych: Normal affect. HEENT:  Normal  Neck: Supple without bruits or JVD. Lungs:  Resp regular and unlabored, CTA. Heart: RRR no s3, s4, or murmurs. Abdomen: Soft, non-tender, non-distended, BS + x 4.  Extremities: No clubbing, cyanosis or edema. DP/PT/Radials 2+ and equal bilaterally.  Accessory Clinical Findings  CBC  Recent Labs  12/15/15 1634 12/16/15 0533 12/17/15 1337  WBC 9.2 7.6 8.3  NEUTROABS 6.1  --   --   HGB 10.7* 10.2* 11.2*  HCT 34.2* 31.9* 36.3*  MCV 84.0 86.4 85.6  PLT 212 225 [id number]   Basic Metabolic Panel  Recent Labs  12/16/15 0822 12/17/15 0856  NA 142 139  K 4.2 3.7  CL 103 97*  CO2 28 28  GLUCOSE 79 78  BUN 43* 19  CREATININE 6.86* 4.36*  CALCIUM 8.7* 8.2*  PHOS 3.2 2.8   Liver Function Tests  Recent Labs  12/15/15 1634 12/16/15 0822 12/17/15 0856  AST 54*  --   --   ALT 38  --   --   ALKPHOS 186*  --   --  BILITOT 1.4*  --   --   PROT 6.4*  --   --   ALBUMIN 2.3* 2.1* 2.0*    Recent Labs  12/16/15 0533  CHOL 75  HDL 24*  LDLCALC 41  TRIG 50  CHOLHDL 3.1   Thyroid Function Tests  Recent Labs  12/15/15 1634  TSH 1.257   Radiology/Studies  Dg Foot 2 Views Left  12/07/2015  2 views of a skeletally mature individual were obtained of the left foot. Study includes lateral and calcaneal axial. Calcaneal spurring is present. Vessel calcifications also present. There is no definitive evidence of acute fracture or stress fracture. No cortical destruction suggest osteomyelitis at this time.  12/17/15 Procedures    Abdominal Aortogram   Lower Extremity Intervention   Peripheral Vascular Intervention    Conclusion    1. No significant aortoiliac disease. 2. One-vessel runoff below the knee on the right side via the peroneal artery which has mild proximal disease. 3. One-vessel runoff below the knee on the left side via the peroneal artery which  has 90% proximal disease. 4. Successful angioplasty and drug-eluting stent placement to the proximal peroneal artery.  Recommendations: Warfarin can be resumed today. If needed, heparin can be resumed tomorrow if there is no groin bleeding complication. The patient did not achieve hemostasis with a mynx closure device. Continue Plavix with warfarin without aspirin to decrease the risk of bleeding. Plavix should be continued for one year. Continue wound care.      ASSESSMENT AND PLAN  Mr. Crisantos is an 82 y/o M with history of CAD (ant-lat MI s/p PTCA/DES to LAD 2006), HTN, HLD, ESRD on HD, paroxysmal atrial fib on amiodarone/Coumadin (NSR by interrogation 10/2015), ICM/chronic systolic CHF (s/p ICD with upgrade to [employer] BiV-ICD 07/2015 when it had reached ERI), GIB 03/2015 in setting of elevated INR, WCT felt to be atrial tachycardia 06/2015, anemia of chronic disease, hypothyroidism who presented to [facility] on 12/15/15 for planned PV angio for critical limb ischemia. Hospital course complicated by supratheraputic INR.   1. PAD with ischemia of limb, awaiting PV angiogram - the patient's procedure was postponed due to supratheraputic INR. He was given 10mg  PO vitamin K for reversal and underwent HD yesterday. INR fell below 2 and he underwent PV angiography yesterday (12/17/15) s/p successful PCI/DES  placement to the proximal peroneal artery. Plan to continue Warfarin and Plavix without ASA to lessen bleed risk. Plavix for 1 year.   2. Paroxysmal atrial fibrillation on chronic Coumadin - CHADSVASC 5. Per discussion with Dr. Gwenlyn Found, stop digoxin given his ESRD. Continue amiodarone 200mg  daily. We will resume coumadin per pharmacy. IN 1.82 today  3. ESRD on HD - in HD today, MWF. He has HD yesterday.   4. Chronic systolic CHF/ICM s/p BiV-ICD - euvolemic. Volume management per HD.  5. Essential HTN - controlled.  6. H/o GIB 03/2015 - no evidence of recurrence. H/H 11.2/36.3.  7. Recent frequent falls  - PT eval as above.   8. Orthostatic hypotension - continue midodrine  9. Alerted mental status: he has been agitated and confused. Speaking nonsense while I was in the room. Will call neurology and keep him overnight for further observation.   10. Dispo: i have consulted care management. Possible SNF or inpatient rehab? Patient's wife is very concerned about taking him home due to falls and acute altered mental status. She doesn't feel like she can safetly care for him at home.   Mable Fill R PA-C  12/18/2015  The patient was seen, examined and discussed with Nell Range, PA-C and I agree with the above.   82 year old male with h/o PAD with ischemia of limb, admitted for elective ischemia of limb, s/p successful PCI/DES  placement to the proximal peroneal artery. He is doing well with no pain in his legs, however new confusion this am, we asked neurology for a consult. EEG is scheduled. Possible head CT ( unable to do MRI as he has a BiV ICD).   Dorothy Spark, MD 12/18/2015

## 2015-12-18 NOTE — Progress Notes (Signed)
ANTICOAGULATION CONSULT NOTE  Pharmacy Consult for warfarin Indication: atrial fibrillation  No Known Allergies  Patient Measurements: Height: 5\' 11"  (180.3 cm) Weight: 157 lb 10.1 oz (71.5 kg) IBW/kg (Calculated) : 75.3  Vital Signs: Temp: 98.4 F (36.9 C) (02/23 0530) Temp Source: Oral (02/23 0530) BP: 124/48 mmHg (02/23 0530) Pulse Rate: 70 (02/23 0530)  Labs:  Recent Labs  12/15/15 1634  12/16/15 0533 12/16/15 0822 12/17/15 0541 12/17/15 0856 12/17/15 1337 12/18/15 0437  HGB 10.7*  --  10.2*  --   --   --  11.2*  --   HCT 34.2*  --  31.9*  --   --   --  36.3*  --   PLT 212  --  225  --   --   --  218  --   LABPROT  --   < > 35.1*  --  25.0*  --  21.5* 21.0*  INR  --   < > 3.60*  --  2.29*  --  1.88* 1.82*  CREATININE 6.18*  --  6.74* 6.86*  --  4.36*  --   --   < > = values in this interval not displayed.  Estimated Creatinine Clearance: 13 mL/min (by C-G formula based on Cr of 4.36).  Assessment: 70 YOM with AFib here for PV angiogram- hx of PAD with ischemia of limb. INR elevated on admission, received vitamin K 5mg  po x1 2/10, another dose of 5mg  PO ordered 2/21. INR today= 1.82 -Noted s/p peripheral cath 2/22: drug-eluting stent placement to the proximal peroneal artery. Plans for coumadin (with plavix for 1 year).  -Spoke to MD and ok to resume coumadin today  Home coumadin: 3mg /day except 1.5mg  on Sun (admit INR= 3.6)  Goal of Therapy:  INR 2-3 Monitor platelets by anticoagulation protocol: Yes   Plan:  - Coumadin 3mg  tonight  --Daily PT/INR  Hildred Laser, Pharm D 12/18/2015 10:07 AM

## 2015-12-19 ENCOUNTER — Other Ambulatory Visit: Payer: Self-pay | Admitting: Physician Assistant

## 2015-12-19 DIAGNOSIS — I739 Peripheral vascular disease, unspecified: Secondary | ICD-10-CM

## 2015-12-19 LAB — CBC
HEMATOCRIT: 29.3 % — AB (ref 39.0–52.0)
Hemoglobin: 9.4 g/dL — ABNORMAL LOW (ref 13.0–17.0)
MCH: 26.4 pg (ref 26.0–34.0)
MCHC: 32.1 g/dL (ref 30.0–36.0)
MCV: 82.3 fL (ref 78.0–100.0)
PLATELETS: 195 10*3/uL (ref 150–400)
RBC: 3.56 MIL/uL — ABNORMAL LOW (ref 4.22–5.81)
RDW: 19.4 % — AB (ref 11.5–15.5)
WBC: 7.8 10*3/uL (ref 4.0–10.5)

## 2015-12-19 LAB — PROTIME-INR
INR: 1.85 — AB (ref 0.00–1.49)
PROTHROMBIN TIME: 21.3 s — AB (ref 11.6–15.2)

## 2015-12-19 LAB — RENAL FUNCTION PANEL
ALBUMIN: 1.9 g/dL — AB (ref 3.5–5.0)
Anion gap: 13 (ref 5–15)
BUN: 34 mg/dL — AB (ref 6–20)
CHLORIDE: 101 mmol/L (ref 101–111)
CO2: 26 mmol/L (ref 22–32)
Calcium: 8.3 mg/dL — ABNORMAL LOW (ref 8.9–10.3)
Creatinine, Ser: 4.99 mg/dL — ABNORMAL HIGH (ref 0.61–1.24)
GFR, EST AFRICAN AMERICAN: 11 mL/min — AB (ref >60)
GFR, EST NON AFRICAN AMERICAN: 10 mL/min — AB (ref >60)
Glucose, Bld: 67 mg/dL (ref 65–99)
PHOSPHORUS: 3.8 mg/dL (ref 2.5–4.6)
POTASSIUM: 4.3 mmol/L (ref 3.5–5.1)
Sodium: 140 mmol/L (ref 135–145)

## 2015-12-19 MED ORDER — SODIUM CHLORIDE 0.9 % IV SOLN: 100.0000 mL | INTRAVENOUS | Status: DC | PRN

## 2015-12-19 MED ORDER — CLOPIDOGREL BISULFATE 75 MG PO TABS: 75.0000 mg | ORAL_TABLET | Freq: Every day | ORAL | Status: DC

## 2015-12-19 MED ORDER — LIDOCAINE HCL (PF) 1 % IJ SOLN: 5.0000 mL | INTRAMUSCULAR | Status: DC | PRN

## 2015-12-19 MED ORDER — ALTEPLASE 2 MG IJ SOLR
2.0000 mg | Freq: Once | INTRAMUSCULAR | Status: DC | PRN
  Filled 2015-12-19: qty 2

## 2015-12-19 MED ORDER — PENTAFLUOROPROP-TETRAFLUOROETH EX AERO: 1.0000 "application " | INHALATION_SPRAY | CUTANEOUS | Status: DC | PRN

## 2015-12-19 MED ORDER — HEPARIN SODIUM (PORCINE) 1000 UNIT/ML DIALYSIS: 1000.0000 [IU] | INTRAMUSCULAR | Status: DC | PRN

## 2015-12-19 MED ORDER — WARFARIN SODIUM 2 MG PO TABS: 3.0000 mg | ORAL_TABLET | Freq: Once | ORAL | Status: DC

## 2015-12-19 MED ORDER — LIDOCAINE-PRILOCAINE 2.5-2.5 % EX CREA
1.0000 "application " | TOPICAL_CREAM | CUTANEOUS | Status: DC | PRN
  Filled 2015-12-19: qty 5

## 2015-12-19 NOTE — Progress Notes (Signed)
[facility] Progress Note   Subjective: confused and agitated overnight  Filed Vitals:   12/19/15 1245 12/19/15 1300 12/19/15 1330 12/19/15 1400  BP: 103/56 101/46 97/53 94/69   Pulse: 70 72 70 70  Temp:      TempSrc:      Resp: 15 21 19 18   Height:      Weight:      SpO2:        Inpatient medications: . allopurinol  100 mg Oral Daily  . amiodarone  200 mg Oral Daily  . atorvastatin  40 mg Oral Daily  . carvedilol  3.125 mg Oral BID WC  . clopidogrel  75 mg Oral Q breakfast  . collagenase  1 application Topical Daily  . [START ON 12/24/2015] darbepoetin (ARANESP) injection - DIALYSIS  200 mcg Intravenous Q Wed-HD  . DULoxetine  30 mg Oral Daily  . midodrine  10 mg Oral Q M,W,F-HD  . multivitamin with minerals  1 tablet Oral Daily  . silver sulfADIAZINE  1 application Topical Daily  . sodium chloride flush  3 mL Intravenous Q12H  . warfarin  3 mg Oral ONCE-1800  . warfarin  3 mg Oral ONCE-1800  . Warfarin - Pharmacist Dosing Inpatient   Does not apply q1800     sodium chloride, sodium chloride, sodium chloride, acetaminophen, alteplase, heparin, lidocaine (PF), lidocaine-prilocaine, pentafluoroprop-tetrafluoroeth, sodium chloride flush, sodium chloride flush  Exam: Alert no distress, confused No jvd Chest clear bilat RRR no MRG Abd soft ntnd no mass or ascites Ext L post lat heel ulcer slightly wet 4 x 4 cm No LE edema; L arm 2+ edema Neuro alert nf ox 3 LUA AVF +bruit  Dialysis: [facility] MWF  4h  71.5kg  2/2.25 bath  Hep none  LUA AVF Hect 5 ug tiw Mircera 200 ug last 2/12 Venofer 50/ hd Hb 10/ Ca 8.8  / P 2.9 / pth 177      Assessment: 1 L foot/ heel ulcer - s/p angiogram w DES to peroneal artery; on plavix and coumadin, plavix x 1 yr    2 ESRD HD MWF 3 Vol at dry wt 4 Afib on amio/ coreg / coumadin 5 ICM / ICD/ CAD hx stent 6 Chron hypotension on midodrine 7 MBD of CKD - vit D, no binder as op 8 Hx GIB - no hep HD 9 AMS - new issue  Plan -  HD today, UF 2kg   Kelly Splinter MD [facility] pager [phone]    cell [phone] 12/19/2015, 2:24 PM    Recent Labs Lab 12/16/15 0822 12/17/15 0856 12/18/15 1200 12/19/15 1135  NA 142 139 141 140  K 4.2 3.7 4.5 4.3  CL 103 97* 102 101  CO2 28 28 28 26   GLUCOSE 79 78 82 67  BUN 43* 19 22* 34*  CREATININE 6.86* 4.36* 3.63* 4.99*  CALCIUM 8.7* 8.2* 8.4* 8.3*  PHOS 3.2 2.8  --  3.8    Recent Labs Lab 12/15/15 1634 12/16/15 0822 12/17/15 0856 12/19/15 1135  AST 54*  --   --   --   ALT 38  --   --   --   ALKPHOS 186*  --   --   --   BILITOT 1.4*  --   --   --   PROT 6.4*  --   --   --   ALBUMIN 2.3* 2.1* 2.0* 1.9*    Recent Labs Lab 12/15/15 1634 12/16/15 0533 12/17/15 1337 12/19/15 1135  WBC 9.2 7.6 8.3 7.8  NEUTROABS 6.1  --   --   --   HGB 10.7* 10.2* 11.2* 9.4*  HCT 34.2* 31.9* 36.3* 29.3*  MCV 84.0 86.4 85.6 82.3  PLT 212 225 218 195

## 2015-12-19 NOTE — Progress Notes (Signed)
Pt came back from HD and I attempted to give pt his medications. Pt refused and became a bit agitated. I called report to receiving RN at [facility] and told her pt hasnt had his medications. I also informed the patients wife and she was going to meet him at the nursing home and she would help staff give it. Pt discharged in stable condition via [facility]AR to SNF

## 2015-12-19 NOTE — Progress Notes (Signed)
Antionette, RN informed that patient needs his daily medications

## 2015-12-19 NOTE — Discharge Summary (Signed)
Discharge Summary    Patient ID: [name],  MRN: [id number], DOB/AGE: November 04, 1929 82 y.o.  Admit date: 12/15/2015 Discharge date: 12/19/2015  Primary Care Provider: Regan Lemming R Primary Cardiologist: Dr. Terrence Dupont Primary PV: Dr. Quay Burow Primary EP: Dr. Cristopher Peru  Discharge Diagnoses    Principal Problem:   Ischemia of extremity Active Problems:   Chronic systolic heart failure (HCC)   Automatic implantable cardioverter-defibrillator in situ   End stage renal disease ([place])   Supratherapeutic INR   Cardiomyopathy, ischemic   PAD (peripheral artery disease) (HCC)   PAF (paroxysmal atrial fibrillation) ([place])   Essential hypertension   History of GI bleed   Frequent falls   Lower limb ischemia   Pressure ulcer   Mental disorder, acute episode   Allergies No Known Allergies  Diagnostic Studies/Procedures    LE angiography 12/17/2015 Procedures    Abdominal Aortogram   Lower Extremity Intervention   Peripheral Vascular Intervention     Conclusion    1. No significant aortoiliac disease. 2. One-vessel runoff below the knee on the right side via the peroneal artery which has mild proximal disease. 3. One-vessel runoff below the knee on the left side via the peroneal artery which has 90% proximal disease. 4. Successful angioplasty and drug-eluting stent placement to the proximal peroneal artery.  Recommendations: Warfarin can be resumed today. If needed, heparin can be resumed tomorrow if there is no groin bleeding complication. The patient did not achieve hemostasis with a mynx closure device. Continue Plavix with warfarin without aspirin to decrease the risk of bleeding. Plavix should be continued for one year. Continue wound care.    _____________   History of Present Illness     [name] is an 82 y/o M with history of CAD (ant-lat MI s/p PTCA/DES to LAD 2006), HTN, HLD, ESRD on HD, paroxysmal atrial fib on amiodarone/Coumadin  (NSR by interrogation 10/2015), ICM/chronic systolic CHF (s/p ICD with upgrade to Medtronic BiV-ICD 07/2015 when it had reached ERI), GIB 03/2015 in setting of elevated INR, WCT felt to be atrial tachycardia 06/2015, anemia of chronic disease, hypothyroidism who presented to [facility] today initially for planned PV angio. He recently saw podiatry for a left heel ulcer which has been present for the last month. Recent Dopplers performed on 12/05/15 revealed occluded tibials bilaterally. He was instructed to hold his Coumadin after last dose 12/10/15 which he affirms that he did. He does admit he hasn't been eating and drinking that well - just doesn't have a robust appetite. He is unsteady on his feet at times - has fallen several times (mechanical) fortunately without significant injury. Denies any recent CP, SOB, syncope, orthopnea, BRBPR, melena, or hematemesis. Labs on 12/09/15 showed INR 3.60, Hgb 11.6 (stable), Cr 3.96. INR today was 4.42, repeated and was 4.88. No recent med changes or additions. No recent abx administered. 5mg  oral vitamin K has been ordered along with PT consult per Dr. Gwenlyn Found. He is being admitted for observation and monitoring of his supratherapeutic INR given his fragile medical history. He reports he already took home meds for the day (except coumadin as above).   Hospital Course     Consultants: Dr. Jonnie Finner Nephrology  [facility] course was complicated by supratherapeutic INR, he was given 10 mg vitamin K prior to the procedure to help reverse INR. Given a suspected ischemia of the limb, he underwent lower extremity angiography on 12/17/2015 which showed one vessel runoff below the knee on the left side  via peroneal artery which has 90% proximal disease, this was successfully treated with angioplasty and drug-eluting stent placement in the proximal peroneal artery. Postprocedure, he was placed on Coumadin and Plavix without aspirin to lessen the bleeding risk. Nephrology was consulted to help  manage his dialysis. His digoxin was stopped given end-stage renal disease.  He had a new confusion in the morning of 12/18/2015. EEG was done on 12/18/2015 which showed diffuse cerebral dysfunction that is nonspecific in etiology can be seen with hypoxic/ischemic, toxic/metabolic encephalopathies, neurodegenerative disorders or medication effect. Patient was seen by physical therapy who recommended skilled nursing facility placement. Patient has been accepted by [facility]. He underwent hemodialysis today on 2/24. He is still confused, however clinically improved compared to yesterday. Confusion is likely related to hospitalization. He is deemed stable for discharge from cardiology perspective.   Dr. Meda Coffee to sign [facility]  He will need PT/INR on 12/23/2015 result send to Dr. Terrence Dupont, Dr. Gwenlyn Found and PCP's office _____________  Discharge Vitals Blood pressure 101/52, pulse 71, temperature 98.1 F (36.7 C), temperature source Axillary, resp. rate 18, height 5\' 11"  (1.803 m), weight 151 lb 10.8 oz (68.8 kg), SpO2 100 %.  Filed Weights   12/19/15 0445 12/19/15 1138 12/19/15 1510  Weight: 153 lb (69.4 kg) 154 lb 15.7 oz (70.3 kg) 151 lb 10.8 oz (68.8 kg)    Labs & Radiologic Studies     CBC  Recent Labs  12/17/15 1337 12/19/15 1135  WBC 8.3 7.8  HGB 11.2* 9.4*  HCT 36.3* 29.3*  MCV 85.6 82.3  PLT 218 0000000   Basic Metabolic Panel  Recent Labs  12/17/15 0856 12/18/15 1200 12/19/15 1135  NA 139 141 140  K 3.7 4.5 4.3  CL 97* 102 101  CO2 28 28 26   GLUCOSE 78 82 67  BUN 19 22* 34*  CREATININE 4.36* 3.63* 4.99*  CALCIUM 8.2* 8.4* 8.3*  PHOS 2.8  --  3.8   Liver Function Tests  Recent Labs  12/17/15 0856 12/19/15 1135  ALBUMIN 2.0* 1.9*    Ct Head Wo Contrast  11/25/2015  CLINICAL DATA:  Increased fall over several days. Bilateral leg weakness. EXAM: CT HEAD WITHOUT CONTRAST TECHNIQUE: Contiguous axial images were obtained from the base of the skull through the vertex without  intravenous contrast. COMPARISON:  None. FINDINGS: No acute intracranial hemorrhage. No focal mass lesion. No CT evidence of acute infarction. No midline shift or mass effect. No hydrocephalus. Basilar cisterns are patent. Paranasal sinuses and  mastoid air cells are clear. Atherosclerotic calcification of the vertebral arteries IMPRESSION: *No acute intracranial findings. *Chronic atrophy and white matter microvascular disease. Electronically Signed   By: Suzy Bouchard M.D.   On: 11/25/2015 15:21   Dg Chest Port 1 View  11/25/2015  CLINICAL DATA:  Multiple falls in past 2 weeks. Weakness. Congestive heart failure. Atrial fibrillation. EXAM: PORTABLE CHEST 1 VIEW COMPARISON:  08/29/2015 FINDINGS: Cardiomegaly is stable in AICD remains in place. Chronic elevation of left hemidiaphragm is again demonstrated with mild left basilar atelectasis. No evidence of pulmonary consolidation. No evidence of pneumothorax or pleural effusion. IMPRESSION: Stable cardiomegaly, elevated left hemidiaphragm, and mild left basilar atelectasis. No acute findings. Electronically Signed   By: Earle Gell M.D.   On: 11/25/2015 17:01    Disposition   Pt is being discharged home today in good condition.  Follow-up Plans & Appointments    Follow-up Information    Schedule an appointment as soon as possible for a visit with Harwani,  Prudencio Burly, MD.   Specialty:  Cardiology   Contact information:   [place] [address] [phone]       Follow up with [facility] On 12/25/2015.   Specialty:  Cardiology   Why:  10:00AM. Obtain LE doppler   Contact information:   [address] [place] [phone]      Follow up with Quay Burow, MD On 12/30/2015.   Specialties:  Cardiology, Radiology   Why:  10:45AM.    Contact information:   [address] [place] [address] [phone]       Follow up with [name], MD.   Specialty:  Pulmonary Disease   Why:  Please contact your PCP to check coumadin level   Contact information:   [place] [address] [phone]      Discharge Instructions    Diet - low sodium heart healthy    Complete by:  As directed      Increase activity slowly    Complete by:  As directed            Discharge Medications   Current Discharge Medication List    START taking these medications   Details  clopidogrel (PLAVIX) 75 MG tablet Take 1 tablet (75 mg total) by mouth daily with breakfast. Qty: 90 tablet, Refills: 3      CONTINUE these medications which have NOT CHANGED   Details  allopurinol (ZYLOPRIM) 100 MG tablet Take 100 mg by mouth daily.     amiodarone (PACERONE) 200 MG tablet Take 200 mg by mouth daily.     atorvastatin (LIPITOR) 40 MG tablet Take 40 mg by mouth daily.      carvedilol (COREG) 3.125 MG tablet Take 1 tablet (3.125 mg total) by mouth 2 (two) times daily with a meal. Qty: 60 tablet, Refills: 1    collagenase (SANTYL) ointment Apply 1 application topically daily. Qty: 15 g, Refills: 0    diphenoxylate-atropine (LOMOTIL) 2.5-0.025 MG tablet 1 tablet once per day if greater than one episode of diarrhea. Qty: 10 tablet, Refills: 0    DULoxetine (CYMBALTA) 30 MG capsule Take 30 mg by mouth daily.    ethyl chloride spray Apply 1 application topically as needed. (prior to dialysis) Refills: 12    midodrine (PROAMATINE) 10 MG tablet Take 10 mg by mouth every Monday, Wednesday, and Friday with hemodialysis. TAKE 1 TABLET 3 TIMES A WEEK PRIOR TO DIALYSIS, AND 1 TABLET AS NEEDED Refills: 5    Multiple Vitamin (MULTIVITAMIN WITH MINERALS) TABS Take 1 tablet by mouth daily. 50+    silver sulfADIAZINE (SILVADENE) 1 % cream Apply 1 application topically daily. Qty: 50 g, Refills: 3    warfarin (COUMADIN) 3 MG tablet Take 3 mg by mouth daily at 6 PM. Take as directed by the Coumadin clinic  1.5 mg on  Sunday and 3 mg all other days Refills: 3   Associated Diagnoses: Paroxysmal atrial fibrillation ([place])      STOP taking these medications     aspirin EC 81 MG EC tablet      Digoxin 62.5 MCG TABS            Outstanding Labs/Studies   He will need PT/INR on 31-Dec-2015   Duration of Discharge Encounter   Greater than 30 minutes including physician time.  Hilbert Corrigan PA-C 12/19/2015, 4:17 PM

## 2015-12-19 NOTE — Progress Notes (Signed)
Patient Name: [name] Date of Encounter: 12/19/2015  Principal Problem:   Ischemia of extremity Active Problems:   Chronic systolic heart failure (HCC)   Automatic implantable cardioverter-defibrillator in situ   End stage renal disease (HCC)   Supratherapeutic INR   Cardiomyopathy, ischemic   PAD (peripheral artery disease) (HCC)   PAF (paroxysmal atrial fibrillation) ([place])   Essential hypertension   History of GI bleed   Frequent falls   Lower limb ischemia   Pressure ulcer   Mental disorder, acute episode   Length of Stay: 4  SUBJECTIVE  He feels better today, oriented x 3.  CURRENT MEDS . allopurinol  100 mg Oral Daily  . amiodarone  200 mg Oral Daily  . atorvastatin  40 mg Oral Daily  . carvedilol  3.125 mg Oral BID WC  . clopidogrel  75 mg Oral Q breakfast  . collagenase  1 application Topical Daily  . [START ON 12/24/2015] darbepoetin (ARANESP) injection - DIALYSIS  200 mcg Intravenous Q Wed-HD  . DULoxetine  30 mg Oral Daily  . midodrine  10 mg Oral Q M,W,F-HD  . multivitamin with minerals  1 tablet Oral Daily  . silver sulfADIAZINE  1 application Topical Daily  . sodium chloride flush  3 mL Intravenous Q12H  . warfarin  3 mg Oral ONCE-1800  . Warfarin - Pharmacist Dosing Inpatient   Does not apply q1800    OBJECTIVE  Filed Vitals:   12/18/15 0530 12/18/15 1515 12/18/15 1934 12/19/15 0445  BP: 124/48 108/46 121/56 109/48  Pulse: 70 69 94 69  Temp: 98.4 F (36.9 C) 98.5 F (36.9 C)  97.4 F (36.3 C)  TempSrc: Oral Oral  Oral  Resp: 24  18 20   Height:      Weight: 157 lb 10.1 oz (71.5 kg)   153 lb (69.4 kg)  SpO2: 100% 99% 98% 100%   No intake or output data in the 24 hours ending 12/19/15 1016 Filed Weights   12/17/15 0834 12/18/15 0530 12/19/15 0445  Weight: 158 lb 15.2 oz (72.1 kg) 157 lb 10.1 oz (71.5 kg) 153 lb (69.4 kg)    PHYSICAL EXAM  General: Pleasant, NAD. Confused and agitated Neuro: Alert and oriented X 3. Moves all  extremities spontaneously. Psych: Normal affect. HEENT:  Normal  Neck: Supple without bruits or JVD. Lungs:  Resp regular and unlabored, CTA. Heart: RRR no s3, s4, or murmurs. Abdomen: Soft, non-tender, non-distended, BS + x 4.  Extremities: No clubbing, cyanosis or edema. Weak pulses B/L, swelling and bandage on the LLE  Accessory Clinical Findings  CBC  Recent Labs  12/17/15 1337  WBC 8.3  HGB 11.2*  HCT 36.3*  MCV 85.6  PLT 99991111   Basic Metabolic Panel  Recent Labs  12/17/15 0856 12/18/15 1200  NA 139 141  K 3.7 4.5  CL 97* 102  CO2 28 28  GLUCOSE 78 82  BUN 19 22*  CREATININE 4.36* 3.63*  CALCIUM 8.2* 8.4*  PHOS 2.8  --    Liver Function Tests  Recent Labs  12/17/15 0856  ALBUMIN 2.0*   Radiology/Studies  Dg Foot 2 Views Left  12/07/2015  2 views of a skeletally mature individual were obtained of the left foot. Study includes lateral and calcaneal axial. Calcaneal spurring is present. Vessel calcifications also present. There is no definitive evidence of acute fracture or stress fracture. No cortical destruction suggest osteomyelitis at this time.  12/17/15 Procedures    Abdominal Aortogram  Lower Extremity Intervention   Peripheral Vascular Intervention    Conclusion    1. No significant aortoiliac disease. 2. One-vessel runoff below the knee on the right side via the peroneal artery which has mild proximal disease. 3. One-vessel runoff below the knee on the left side via the peroneal artery which has 90% proximal disease. 4. Successful angioplasty and drug-eluting stent placement to the proximal peroneal artery.  Recommendations: Warfarin can be resumed today. If needed, heparin can be resumed tomorrow if there is no groin bleeding complication. The patient did not achieve hemostasis with a mynx closure device. Continue Plavix with warfarin without aspirin to decrease the risk of bleeding. Plavix should be continued for one year. Continue wound  care.      ASSESSMENT AND PLAN  [name] is an 82 y/o M with history of CAD (ant-lat MI s/p PTCA/DES to LAD 2006), HTN, HLD, ESRD on HD, paroxysmal atrial fib on amiodarone/Coumadin (NSR by interrogation 10/2015), ICM/chronic systolic CHF (s/p ICD with upgrade to [employer]tr[employer] BiV-ICD 07/2015 when it had reached ERI), GIB 03/2015 in setting of elevated INR, WCT felt to be atrial tachycardia 06/2015, anemia of chronic disease, hypothyroidism who presented to [facility] on 12/15/15 for planned PV angio for critical limb ischemia. Hospital course complicated by supratheraputic INR.   1. PAD with ischemia of limb, awaiting PV angiogram - the patient's procedure was postponed due to supratheraputic INR. He was given 10mg  PO vitamin K for reversal and underwent HD yesterday.  S/p successful PCI/DES  placement to the proximal peroneal artery. Plan to continue Warfarin and Plavix without ASA to lessen bleed risk. Plavix for 1 year.   2. Paroxysmal atrial fibrillation on chronic Coumadin - CHADSVASC 5. Per discussion with Dr. Gwenlyn Found, stop digoxin given his ESRD. Continue amiodarone 200mg  daily. We will resume coumadin per pharmacy. IN 1.82 today  3. ESRD on HD - in HD today, MWF. He has HD yesterday.   4. Chronic systolic CHF/ICM s/p BiV-ICD - euvolemic. Volume management per HD.  5. Essential HTN - controlled.  6. H/o GIB 03/2015 - no evidence of recurrence. H/H 11.2/36.3.  7. Recent frequent falls - PT eval as above.   8. Orthostatic hypotension - continue midodrine  9. Alerted mental status: he has been agitated and confused. Speaking nonsense while I was in the room. Will call neurology and keep him overnight for further observation.   10. Dispo: i have consulted care management. Possible SNF or inpatient rehab? Patient's wife is very concerned about taking him home due to falls and acute altered mental status. She doesn't feel like she can safetly care for him at home.   He is stable from cardiac  standpoint, he could be discharged. His INR today 1.82, he needs bridging, followed by pharmacy. He needs to go to SNF/rehab as they live alone, we will consult case management. HD before discharge.  Jacob Spark, MD 12/19/2015

## 2015-12-19 NOTE — Clinical Social Work Note (Signed)
CSW unable to complete full assessment due to same day discharge. Wife and patient agreeable to DC to [facility]. All paperwork completed. CSW signing off.  Liz Beach MSW, [place], [place], [id number]

## 2015-12-19 NOTE — Clinical Social Work Placement (Signed)
   CLINICAL SOCIAL WORK PLACEMENT  NOTE  Date:  12/19/2015  Patient Details  Name: [name] MRN: [id number] Date of Birth: Jun 14, 1930  Clinical Social Work is seeking post-discharge placement for this patient at the [place] level of care (*CSW will initial, date and re-position this form in  chart as items are completed):  Yes   Patient/family provided with [facility] Work Department's list of facilities offering this level of care within the geographic area requested by the patient (or if unable, by the patient's family).  Yes   Patient/family informed of their freedom to choose among providers that offer the needed level of care, that participate in Medicare, Medicaid or managed care program needed by the patient, have an available bed and are willing to accept the patient.  Yes   Patient/family informed of [facility]'s ownership interest in [facility] and [facility], as well as of the fact that they are under no obligation to receive care at these facilities.  PASRR submitted to EDS on 12/19/15     PASRR number received on 12/19/15     Existing PASRR number confirmed on       FL2 transmitted to all facilities in geographic area requested by pt/family on 12/19/15     FL2 transmitted to all facilities within larger geographic area on       Patient informed that his/her managed care company has contracts with or will negotiate with certain facilities, including the following:        Yes   Patient/family informed of bed offers received.  Patient chooses bed at [facility]     Physician recommends and patient chooses bed at      Patient to be transferred to [facility] on 12/19/15.  Patient to be transferred to facility by Ambulance     Patient family notified on 12/19/15 of transfer.  Name of family member notified:  Jeannie     PHYSICIAN Please prepare priority discharge summary, including  medications, Please prepare prescriptions, Please sign FL2     Additional Comment:  Per MD patient ready for DC to [facility]. RN, patient, patient's family, and facility notified of DC. RN given number for report. DC packet on chart. Ambulance transport requested for patient. PTAR states ETA is 3 hrs. CSW signing off.   _______________________________________________ Liz Beach MSW, [place], [place], [id number]

## 2015-12-19 NOTE — Progress Notes (Signed)
ANTICOAGULATION CONSULT NOTE - Follow Up  Pharmacy Consult for warfarin Indication: atrial fibrillation  No Known Allergies  Patient Measurements: Height: 5\' 11"  (180.3 cm) Weight: 153 lb (69.4 kg) IBW/kg (Calculated) : 75.3  Vital Signs: Temp: 97.4 F (36.3 C) (02/24 0445) Temp Source: Oral (02/24 0445) BP: 109/48 mmHg (02/24 0445) Pulse Rate: 69 (02/24 0445)  Labs:  Recent Labs  12/17/15 0856 12/17/15 1337 12/18/15 0437 12/18/15 1200 12/19/15 0500  HGB  --  11.2*  --   --   --   HCT  --  36.3*  --   --   --   PLT  --  218  --   --   --   LABPROT  --  21.5* 21.0*  --  21.3*  INR  --  1.88* 1.82*  --  1.85*  CREATININE 4.36*  --   --  3.63*  --     Estimated Creatinine Clearance: 15.1 mL/min (by C-G formula based on Cr of 3.63).  Assessment: 82 yo M with AFib here for PV angiogram- hx of PAD with ischemia of limb. INR elevated on admission, received vitamin K 5mg  po x1 2/10, another dose of 5mg  PO ordered 2/21. Noted s/p peripheral cath 2/22: drug-eluting stent placement to the proximal peroneal artery. Plans for coumadin (with plavix for 1 year).  Coumadin restarted 2/23, INR today= 1.85  Home coumadin: 3mg /day except 1.5mg  on Sun (admit INR= 3.6)  Goal of Therapy:  INR 2-3 Monitor platelets by anticoagulation protocol: Yes   Plan:  - Coumadin 3mg  tonight  --Daily PT/INR  [employer], Pharm.D., BCPS Clinical Pharmacist Pager [phone] 12/19/2015 11:51 AM

## 2015-12-19 NOTE — NC FL2 (Addendum)
[place] LEVEL OF CARE SCREENING TOOL     IDENTIFICATION  Patient Name: [name] Birthdate: Jan 13, 1930 Sex: male Admission Date (Current Location): 12/15/2015  [facility] and [place] Number:  Herbalist and Address:  [facility] [place]. [facility], [place] [address], [place], [place] [address]      Provider Number: [id number]  Attending Physician Name and Address:  Lorretta Harp, MD  Relative Name and Phone Number:       Current Level of Care: Hospital Recommended Level of Care: [place] Prior Approval Number:    Date Approved/Denied:   PASRR Number: [id number]  Discharge Plan: SNF    Current Diagnoses: Patient Active Problem List   Diagnosis Date Noted  . Mental disorder, acute episode   . Pressure ulcer 12/16/2015  . Ischemia of extremity 12/15/2015  . PAD (peripheral artery disease) ([place]) 12/15/2015  . PAF (paroxysmal atrial fibrillation) ([place]) 12/15/2015  . Essential hypertension 12/15/2015  . History of GI bleed 12/15/2015  . Frequent falls 12/15/2015  . Lower limb ischemia 12/15/2015  . Open wound of left heel 12/11/2015  . Critical lower limb ischemia 12/09/2015  . Cardiomyopathy, ischemic   . Atrial tachycardia ([place])   . Wide-complex tachycardia ([place]) 07/09/2015  . Melena 04/07/2015  . Supratherapeutic INR 04/07/2015  . End stage renal disease ([place]) 11/21/2012  . Aftercare following surgery of the circulatory system, [place] 11/21/2012  . Chronic systolic heart failure ([place]) 11/13/2009  . Automatic implantable cardioverter-defibrillator in situ 11/13/2009    Orientation RESPIRATION BLADDER Height & Weight     Place, Self  Normal Continent Weight: 69.4 kg (153 lb) Height:  5\' 11"  (180.3 cm)  BEHAVIORAL SYMPTOMS/MOOD NEUROLOGICAL BOWEL NUTRITION STATUS   (NONE)  (NONE) Continent Diet (Heart Healthy.)  AMBULATORY STATUS COMMUNICATION OF NEEDS Skin   Extensive Assist Verbally Other (Comment) (Deep tissue  injury of right hip, full thickness loss ankle left, unstageable PU of heel left and hip right. )                       Personal Care Assistance Level of Assistance  Bathing, Feeding, Dressing Bathing Assistance: Limited assistance Feeding assistance: Independent Dressing Assistance: Limited assistance     Functional Limitations Info  Sight, Hearing, Speech Sight Info: Impaired Hearing Info: Adequate Speech Info: Adequate    SPECIAL CARE FACTORS FREQUENCY  PT (By licensed PT), OT (By licensed OT)     PT Frequency: 5/week OT Frequency: 5/week            Contractures      Additional Factors Info  Code Status, Allergies, Psychotropic Code Status Info: Full Code Allergies Info: NKDA Psychotropic Info: Cymbalta         Current Medications (12/19/2015):  This is the current hospital active medication list Current Facility-Administered Medications  Medication Dose Route Frequency Provider Last Rate Last Dose  . 0.9 %  sodium chloride infusion  250 mL Intravenous PRN Wellington Hampshire, MD      . acetaminophen (TYLENOL) tablet 650 mg  650 mg Oral Q4H PRN Dayna N Dunn, PA-C      . allopurinol (ZYLOPRIM) tablet 100 mg  100 mg Oral Daily Dayna N Dunn, PA-C   100 mg at 12/18/15 1210  . amiodarone (PACERONE) tablet 200 mg  200 mg Oral Daily Dayna N Dunn, PA-C   200 mg at 12/18/15 1210  . atorvastatin (LIPITOR) tablet 40 mg  40 mg Oral Daily  Dayna N Dunn, PA-C   40 mg at 12/18/15 1210  . carvedilol (COREG) tablet 3.125 mg  3.125 mg Oral BID WC Dayna N Dunn, PA-C   3.125 mg at 12/18/15 1210  . clopidogrel (PLAVIX) tablet 75 mg  75 mg Oral Q breakfast Wellington Hampshire, MD   75 mg at 12/18/15 1209  . collagenase (SANTYL) ointment 1 application  1 application Topical Daily Dayna N Dunn, PA-C   1 application at [id number]  . [START ON 12/24/2015] Darbepoetin Alfa (ARANESP) injection 200 mcg  200 mcg Intravenous Q Wed-HD Ernest Haber, PA-C      . DULoxetine (CYMBALTA) DR capsule 30  mg  30 mg Oral Daily Dayna N Dunn, PA-C   30 mg at 12/18/15 1209  . midodrine (PROAMATINE) tablet 10 mg  10 mg Oral Q M,W,F-HD Dayna N Dunn, PA-C   10 mg at 12/17/15 1334  . multivitamin with minerals tablet 1 tablet  1 tablet Oral Daily Dayna N Dunn, PA-C   1 tablet at 12/18/15 1210  . silver sulfADIAZINE (SILVADENE) 1 % cream 1 application  1 application Topical Daily Lorretta Harp, MD   1 application at [id number]  . sodium chloride flush (NS) 0.9 % injection 3 mL  3 mL Intravenous PRN Lorretta Harp, MD      . sodium chloride flush (NS) 0.9 % injection 3 mL  3 mL Intravenous Q12H Wellington Hampshire, MD   3 mL at 12/17/15 2141  . sodium chloride flush (NS) 0.9 % injection 3 mL  3 mL Intravenous PRN Wellington Hampshire, MD      . warfarin (COUMADIN) tablet 3 mg  3 mg Oral ONCE-1800 Kris Mouton, RPH   3 mg at 12/18/15 1845  . Warfarin - Pharmacist Dosing Inpatient   Does not apply q1800 Lauren D Bajbus, [place]         Discharge Medications: Please see discharge summary for a list of discharge medications.  Relevant Imaging Results:  Relevant Lab Results:   Additional Information HD - MWF on [place]. Patient receives [facility] transport to HD. SSN [id number]  Rigoberto Noel, LCSW    Dorothy Spark, MD 12/19/2015

## 2015-12-23 ENCOUNTER — Other Ambulatory Visit: Payer: Self-pay | Admitting: Physician Assistant

## 2015-12-23 ENCOUNTER — Ambulatory Visit: Payer: Medicare Other | Admitting: Podiatry

## 2015-12-23 DIAGNOSIS — I739 Peripheral vascular disease, unspecified: Secondary | ICD-10-CM

## 2015-12-25 ENCOUNTER — Ambulatory Visit (HOSPITAL_COMMUNITY)
Admission: RE | Admit: 2015-12-25 | Discharge: 2015-12-25 | Disposition: A | Discharge status: Home / Self Care | Payer: Medicare Other | Source: Ambulatory Visit | Attending: Physician Assistant | Admitting: Physician Assistant

## 2015-12-25 DIAGNOSIS — E785 Hyperlipidemia, unspecified: Secondary | ICD-10-CM | POA: Insufficient documentation

## 2015-12-25 DIAGNOSIS — Y838 Other surgical procedures as the cause of abnormal reaction of the patient, or of later complication, without mention of misadventure at the time of the procedure: Secondary | ICD-10-CM | POA: Insufficient documentation

## 2015-12-25 DIAGNOSIS — I739 Peripheral vascular disease, unspecified: Secondary | ICD-10-CM

## 2015-12-25 DIAGNOSIS — T82856A Stenosis of peripheral vascular stent, initial encounter: Secondary | ICD-10-CM | POA: Insufficient documentation

## 2015-12-25 DIAGNOSIS — I132 Hypertensive heart and chronic kidney disease with heart failure and with stage 5 chronic kidney disease, or end stage renal disease: Secondary | ICD-10-CM

## 2015-12-25 DIAGNOSIS — I5022 Chronic systolic (congestive) heart failure: Secondary | ICD-10-CM

## 2015-12-25 DIAGNOSIS — R938 Abnormal findings on diagnostic imaging of other specified body structures: Secondary | ICD-10-CM

## 2015-12-25 DIAGNOSIS — N186 End stage renal disease: Secondary | ICD-10-CM

## 2015-12-25 DIAGNOSIS — Z992 Dependence on renal dialysis: Secondary | ICD-10-CM

## 2015-12-26 ENCOUNTER — Emergency Department (HOSPITAL_COMMUNITY)
Admission: EM | Admit: 2015-12-26 | Discharge: 2015-12-26 | Disposition: A | Discharge status: Home / Self Care | Payer: Medicare Other | Source: Home / Self Care | Attending: Emergency Medicine | Admitting: Emergency Medicine

## 2015-12-26 ENCOUNTER — Encounter (HOSPITAL_COMMUNITY): Payer: Self-pay

## 2015-12-26 ENCOUNTER — Inpatient Hospital Stay (HOSPITAL_COMMUNITY)
Admission: AD | Admit: 2015-12-26 | Discharge: 2016-01-05 | DRG: 314 | Disposition: A | Discharge status: Skilled Nursing Facility | Payer: Medicare Other | Source: Ambulatory Visit | Attending: Cardiovascular Disease | Admitting: Cardiovascular Disease

## 2015-12-26 ENCOUNTER — Encounter (HOSPITAL_COMMUNITY): Payer: Self-pay | Admitting: *Deleted

## 2015-12-26 DIAGNOSIS — N186 End stage renal disease: Secondary | ICD-10-CM | POA: Diagnosis present

## 2015-12-26 DIAGNOSIS — L89620 Pressure ulcer of left heel, unstageable: Secondary | ICD-10-CM | POA: Diagnosis present

## 2015-12-26 DIAGNOSIS — I959 Hypotension, unspecified: Secondary | ICD-10-CM | POA: Diagnosis present

## 2015-12-26 DIAGNOSIS — T82856A Stenosis of peripheral vascular stent, initial encounter: Principal | ICD-10-CM | POA: Diagnosis present

## 2015-12-26 DIAGNOSIS — Z7902 Long term (current) use of antithrombotics/antiplatelets: Secondary | ICD-10-CM

## 2015-12-26 DIAGNOSIS — G9340 Encephalopathy, unspecified: Secondary | ICD-10-CM | POA: Insufficient documentation

## 2015-12-26 DIAGNOSIS — N2581 Secondary hyperparathyroidism of renal origin: Secondary | ICD-10-CM | POA: Diagnosis present

## 2015-12-26 DIAGNOSIS — I70244 Atherosclerosis of native arteries of left leg with ulceration of heel and midfoot: Secondary | ICD-10-CM | POA: Diagnosis present

## 2015-12-26 DIAGNOSIS — I639 Cerebral infarction, unspecified: Secondary | ICD-10-CM

## 2015-12-26 DIAGNOSIS — Z96651 Presence of right artificial knee joint: Secondary | ICD-10-CM | POA: Diagnosis present

## 2015-12-26 DIAGNOSIS — Z95810 Presence of automatic (implantable) cardiac defibrillator: Secondary | ICD-10-CM | POA: Diagnosis not present

## 2015-12-26 DIAGNOSIS — N250 Renal osteodystrophy: Secondary | ICD-10-CM | POA: Diagnosis present

## 2015-12-26 DIAGNOSIS — L97429 Non-pressure chronic ulcer of left heel and midfoot with unspecified severity: Secondary | ICD-10-CM | POA: Diagnosis present

## 2015-12-26 DIAGNOSIS — L89212 Pressure ulcer of right hip, stage 2: Secondary | ICD-10-CM | POA: Diagnosis present

## 2015-12-26 DIAGNOSIS — I132 Hypertensive heart and chronic kidney disease with heart failure and with stage 5 chronic kidney disease, or end stage renal disease: Secondary | ICD-10-CM | POA: Diagnosis present

## 2015-12-26 DIAGNOSIS — Z87891 Personal history of nicotine dependence: Secondary | ICD-10-CM

## 2015-12-26 DIAGNOSIS — I10 Essential (primary) hypertension: Secondary | ICD-10-CM | POA: Diagnosis not present

## 2015-12-26 DIAGNOSIS — R410 Disorientation, unspecified: Secondary | ICD-10-CM | POA: Diagnosis not present

## 2015-12-26 DIAGNOSIS — I998 Other disorder of circulatory system: Secondary | ICD-10-CM | POA: Diagnosis present

## 2015-12-26 DIAGNOSIS — I255 Ischemic cardiomyopathy: Secondary | ICD-10-CM | POA: Diagnosis present

## 2015-12-26 DIAGNOSIS — F0390 Unspecified dementia without behavioral disturbance: Secondary | ICD-10-CM | POA: Diagnosis present

## 2015-12-26 DIAGNOSIS — E785 Hyperlipidemia, unspecified: Secondary | ICD-10-CM | POA: Diagnosis present

## 2015-12-26 DIAGNOSIS — I252 Old myocardial infarction: Secondary | ICD-10-CM | POA: Diagnosis not present

## 2015-12-26 DIAGNOSIS — I70229 Atherosclerosis of native arteries of extremities with rest pain, unspecified extremity: Secondary | ICD-10-CM | POA: Diagnosis present

## 2015-12-26 DIAGNOSIS — D638 Anemia in other chronic diseases classified elsewhere: Secondary | ICD-10-CM | POA: Diagnosis present

## 2015-12-26 DIAGNOSIS — I480 Paroxysmal atrial fibrillation: Secondary | ICD-10-CM | POA: Diagnosis present

## 2015-12-26 DIAGNOSIS — E039 Hypothyroidism, unspecified: Secondary | ICD-10-CM | POA: Diagnosis present

## 2015-12-26 DIAGNOSIS — Z7901 Long term (current) use of anticoagulants: Secondary | ICD-10-CM

## 2015-12-26 DIAGNOSIS — I2510 Atherosclerotic heart disease of native coronary artery without angina pectoris: Secondary | ICD-10-CM | POA: Diagnosis present

## 2015-12-26 DIAGNOSIS — I5022 Chronic systolic (congestive) heart failure: Secondary | ICD-10-CM | POA: Diagnosis present

## 2015-12-26 DIAGNOSIS — I9589 Other hypotension: Secondary | ICD-10-CM | POA: Diagnosis present

## 2015-12-26 DIAGNOSIS — I739 Peripheral vascular disease, unspecified: Secondary | ICD-10-CM | POA: Diagnosis present

## 2015-12-26 DIAGNOSIS — L89152 Pressure ulcer of sacral region, stage 2: Secondary | ICD-10-CM | POA: Diagnosis present

## 2015-12-26 DIAGNOSIS — Z992 Dependence on renal dialysis: Secondary | ICD-10-CM

## 2015-12-26 LAB — CBC WITH DIFFERENTIAL/PLATELET
BASOS PCT: 0 %
Basophils Absolute: 0 10*3/uL (ref 0.0–0.1)
EOS ABS: 0.1 10*3/uL (ref 0.0–0.7)
Eosinophils Relative: 2 %
HCT: 34.3 % — ABNORMAL LOW (ref 39.0–52.0)
Hemoglobin: 10.8 g/dL — ABNORMAL LOW (ref 13.0–17.0)
Lymphocytes Relative: 11 %
Lymphs Abs: 1 10*3/uL (ref 0.7–4.0)
MCH: 25.9 pg — ABNORMAL LOW (ref 26.0–34.0)
MCHC: 31.5 g/dL (ref 30.0–36.0)
MCV: 82.3 fL (ref 78.0–100.0)
MONO ABS: 1.4 10*3/uL — AB (ref 0.1–1.0)
MONOS PCT: 16 %
NEUTROS ABS: 6.3 10*3/uL (ref 1.7–7.7)
NEUTROS PCT: 71 %
Platelets: 196 10*3/uL (ref 150–400)
RBC: 4.17 MIL/uL — ABNORMAL LOW (ref 4.22–5.81)
RDW: 20 % — AB (ref 11.5–15.5)
WBC: 8.9 10*3/uL (ref 4.0–10.5)

## 2015-12-26 LAB — BASIC METABOLIC PANEL
ANION GAP: 14 (ref 5–15)
BUN: 12 mg/dL (ref 6–20)
CHLORIDE: 96 mmol/L — AB (ref 101–111)
CO2: 28 mmol/L (ref 22–32)
CREATININE: 2.71 mg/dL — AB (ref 0.61–1.24)
Calcium: 8.4 mg/dL — ABNORMAL LOW (ref 8.9–10.3)
GFR calc non Af Amer: 20 mL/min — ABNORMAL LOW (ref >60)
GFR, EST AFRICAN AMERICAN: 23 mL/min — AB (ref >60)
Glucose, Bld: 112 mg/dL — ABNORMAL HIGH (ref 65–99)
Potassium: 3.2 mmol/L — ABNORMAL LOW (ref 3.5–5.1)
SODIUM: 138 mmol/L (ref 135–145)

## 2015-12-26 LAB — PROTIME-INR
INR: 2.22 — AB (ref 0.00–1.49)
PROTHROMBIN TIME: 24.4 s — AB (ref 11.6–15.2)

## 2015-12-26 MED ORDER — AMIODARONE HCL 200 MG PO TABS
200.0000 mg | ORAL_TABLET | Freq: Every day | ORAL | Status: DC
  Administered 2015-12-27 – 2016-01-05 (×10): 200 mg via ORAL
  Filled 2015-12-26 (×10): qty 1

## 2015-12-26 MED ORDER — ONDANSETRON HCL 4 MG/2ML IJ SOLN: 4.0000 mg | Freq: Four times a day (QID) | INTRAMUSCULAR | Status: DC | PRN

## 2015-12-26 MED ORDER — SODIUM CHLORIDE 0.9 % IV SOLN: 250.0000 mL | INTRAVENOUS | Status: DC | PRN

## 2015-12-26 MED ORDER — CAMPHOR-MENTHOL 0.5-0.5 % EX LOTN
1.0000 "application " | TOPICAL_LOTION | Freq: Three times a day (TID) | CUTANEOUS | Status: DC | PRN
  Filled 2015-12-26: qty 222

## 2015-12-26 MED ORDER — NEPRO/CARBSTEADY PO LIQD
237.0000 mL | Freq: Two times a day (BID) | ORAL | Status: DC
Start: 2015-12-27 — End: 2016-01-05
  Administered 2015-12-27 – 2016-01-04 (×16): 237 mL via ORAL
  Filled 2015-12-26 (×22): qty 237

## 2015-12-26 MED ORDER — ONDANSETRON HCL 4 MG PO TABS: 4.0000 mg | ORAL_TABLET | Freq: Four times a day (QID) | ORAL | Status: DC | PRN

## 2015-12-26 MED ORDER — NITROGLYCERIN 0.4 MG SL SUBL: 0.4000 mg | SUBLINGUAL_TABLET | SUBLINGUAL | Status: DC | PRN

## 2015-12-26 MED ORDER — ADULT MULTIVITAMIN W/MINERALS CH
1.0000 | ORAL_TABLET | Freq: Every day | ORAL | Status: DC
  Administered 2015-12-27 – 2016-01-05 (×10): 1 via ORAL
  Filled 2015-12-26 (×10): qty 1

## 2015-12-26 MED ORDER — DOCUSATE SODIUM 283 MG RE ENEM
1.0000 | ENEMA | RECTAL | Status: DC | PRN
  Filled 2015-12-26: qty 1

## 2015-12-26 MED ORDER — DULOXETINE HCL 30 MG PO CPEP
30.0000 mg | ORAL_CAPSULE | Freq: Every day | ORAL | Status: DC
  Administered 2015-12-27 – 2016-01-05 (×10): 30 mg via ORAL
  Filled 2015-12-26 (×10): qty 1

## 2015-12-26 MED ORDER — HYDROXYZINE HCL 25 MG PO TABS: 25.0000 mg | ORAL_TABLET | Freq: Three times a day (TID) | ORAL | Status: DC | PRN

## 2015-12-26 MED ORDER — MIDODRINE HCL 5 MG PO TABS
10.0000 mg | ORAL_TABLET | ORAL | Status: DC
  Administered 2015-12-29 – 2016-01-05 (×4): 10 mg via ORAL
  Filled 2015-12-26 (×3): qty 2

## 2015-12-26 MED ORDER — ATORVASTATIN CALCIUM 40 MG PO TABS
40.0000 mg | ORAL_TABLET | Freq: Every day | ORAL | Status: DC
  Administered 2015-12-27 – 2016-01-04 (×9): 40 mg via ORAL
  Filled 2015-12-26 (×9): qty 1

## 2015-12-26 MED ORDER — CARVEDILOL 3.125 MG PO TABS
3.1250 mg | ORAL_TABLET | Freq: Two times a day (BID) | ORAL | Status: DC
  Administered 2015-12-27 – 2016-01-05 (×14): 3.125 mg via ORAL
  Filled 2015-12-26 (×16): qty 1

## 2015-12-26 MED ORDER — NEPRO/CARBSTEADY PO LIQD
237.0000 mL | Freq: Three times a day (TID) | ORAL | Status: DC | PRN
  Filled 2015-12-26: qty 237

## 2015-12-26 MED ORDER — SILVER SULFADIAZINE 1 % EX CREA
1.0000 "application " | TOPICAL_CREAM | Freq: Every day | CUTANEOUS | Status: DC
  Administered 2015-12-27 – 2015-12-29 (×3): 1 via TOPICAL
  Filled 2015-12-26: qty 85

## 2015-12-26 MED ORDER — SORBITOL 70 % SOLN
30.0000 mL | Status: DC | PRN
  Filled 2015-12-26: qty 30

## 2015-12-26 MED ORDER — SODIUM CHLORIDE 0.9% FLUSH: 3.0000 mL | INTRAVENOUS | Status: DC | PRN

## 2015-12-26 MED ORDER — SODIUM CHLORIDE 0.9% FLUSH
3.0000 mL | Freq: Two times a day (BID) | INTRAVENOUS | Status: DC
  Administered 2015-12-27 – 2016-01-04 (×9): 3 mL via INTRAVENOUS

## 2015-12-26 MED ORDER — ZOLPIDEM TARTRATE 5 MG PO TABS
5.0000 mg | ORAL_TABLET | Freq: Every evening | ORAL | Status: DC | PRN
  Administered 2015-12-27: 5 mg via ORAL
  Filled 2015-12-26: qty 1

## 2015-12-26 MED ORDER — ALLOPURINOL 100 MG PO TABS
100.0000 mg | ORAL_TABLET | Freq: Every day | ORAL | Status: DC
  Administered 2015-12-27 – 2016-01-05 (×10): 100 mg via ORAL
  Filled 2015-12-26 (×10): qty 1

## 2015-12-26 MED ORDER — COLLAGENASE 250 UNIT/GM EX OINT
1.0000 "application " | TOPICAL_OINTMENT | Freq: Every day | CUTANEOUS | Status: DC
  Administered 2015-12-27 – 2016-01-04 (×8): 1 via TOPICAL
  Filled 2015-12-26 (×3): qty 30

## 2015-12-26 MED ORDER — ACETAMINOPHEN 325 MG PO TABS
650.0000 mg | ORAL_TABLET | Freq: Four times a day (QID) | ORAL | Status: DC | PRN
  Administered 2015-12-30: 650 mg via ORAL
  Filled 2015-12-26: qty 2

## 2015-12-26 MED ORDER — CLOPIDOGREL BISULFATE 75 MG PO TABS
75.0000 mg | ORAL_TABLET | Freq: Every day | ORAL | Status: DC
  Administered 2015-12-27 – 2016-01-05 (×10): 75 mg via ORAL
  Filled 2015-12-26 (×13): qty 1

## 2015-12-26 MED ORDER — ACETAMINOPHEN 650 MG RE SUPP: 650.0000 mg | Freq: Four times a day (QID) | RECTAL | Status: DC | PRN

## 2015-12-26 MED ORDER — CALCIUM CARBONATE 1250 MG/5ML PO SUSP
500.0000 mg | Freq: Four times a day (QID) | ORAL | Status: DC | PRN
  Filled 2015-12-26: qty 5

## 2015-12-26 NOTE — Progress Notes (Signed)
ANTICOAGULATION CONSULT NOTE - Initial Consult  Pharmacy Consult for heparin Indication: atrial fibrillation  No Known Allergies  Patient Measurements: Weight: 151 lb 10.8 oz (68.8 kg) Heparin Dosing Weight: 68.8  Vital Signs: Temp: 97.4 F (36.3 C) (03/03 1948) Temp Source: Oral (03/03 1948) BP: 105/51 mmHg (03/03 1948) Pulse Rate: 70 (03/03 1948)  Labs:  Recent Labs  12/26/15 2102  HGB 10.8*  HCT 34.3*  PLT 196    Estimated Creatinine Clearance: 10.9 mL/min (by C-G formula based on Cr of 4.99).   Medical History: Past Medical History  Diagnosis Date  . Dyslipidemia   . Chronic systolic CHF (congestive heart failure) ([place])     a. s/p ICD with upgrade to Medtronic BiV-ICD 07/2015 when it had reached ERI.  . Paroxysmal atrial fibrillation ([place])   . Hypothyroidism   . Coronary artery disease     a. ant-lat MI s/p PTCA/DES to LAD 2006.  Mar[place] Kitchen Hypertension   . Ischemic cardiomyopathy     a. s/p MDT CRTD  . AICD (automatic cardioverter/defibrillator) present   . Myocardial infarction ([place])   . Depression     hx  . History of blood transfusion 03/2015    LGIB  . GI bleed     a. 03/2015 in setting of elevated INR.  [place] Kitchen Arthritis     "everywhere"  . History of gout   . ESRD (end stage renal disease) on dialysis [facility])     "[place]; MWF" (07/10/2015)  . Prostate cancer ([place])     S/P seed implants  . Critical lower limb ischemia   . LBBB (left bundle branch block)   . Atrial tachycardia ([place])     a. WCT 06/2015 felt to be atrial tach.  . Anemia of chronic disease   . PAD (peripheral artery disease) ([facility])     Medications:  Prescriptions prior to admission  Medication Sig Dispense Refill Last Dose  . allopurinol (ZYLOPRIM) 100 MG tablet Take 100 mg by mouth daily.    12/26/2015 at 800  . amiodarone (PACERONE) 200 MG tablet Take 200 mg by mouth daily.    12/26/2015 at 800  . atorvastatin (LIPITOR) 40 MG tablet Take 40 mg by mouth daily at 6 PM.    12/25/2015 at  Unknown time  . carvedilol (COREG) 3.125 MG tablet Take 1 tablet (3.125 mg total) by mouth 2 (two) times daily with a meal. 60 tablet 1 maybe 3/3 at 800  . clopidogrel (PLAVIX) 75 MG tablet Take 1 tablet (75 mg total) by mouth daily with breakfast. 90 tablet 3 12/26/2015 at 800  . collagenase (SANTYL) ointment Apply 1 application topically daily. 15 g 0 12/25/2015 at Unknown time  . DULoxetine (CYMBALTA) 30 MG capsule Take 30 mg by mouth daily.   12/26/2015 at 800  . ethyl chloride spray Apply 1 application topically as needed. (prior to dialysis)  12 12/26/2015 at Unknown time  . midodrine (PROAMATINE) 10 MG tablet Take 10 mg by mouth every Monday, Wednesday, and Friday.   5 12/26/2015 at 800  . Multiple Vitamin (MULTIVITAMIN WITH MINERALS) TABS Take 1 tablet by mouth daily. 50+   12/26/2015 at 800  . Multiple Vitamins-Minerals (DECUBI-VITE) CAPS Take 1 capsule by mouth daily.   12/26/2015 at 800  . Nutritional Supplements (FEEDING SUPPLEMENT, NEPRO CARB STEADY,) LIQD Take 237 mLs by mouth 2 (two) times daily between meals. 12pm, 5pm   12/25/2015 at Unknown time  . silver sulfADIAZINE (SILVADENE) 1 % cream Apply 1 application topically  daily. 50 g 3 12/25/2015 at Unknown time  . warfarin (COUMADIN) 3 MG tablet Take 1.5-3 mg by mouth daily at 6 PM. Take 1/2 tablet (1.5 mg) by mouth on Sunday evening, take 1 tablet (3 mg) on all other days of the week  3 12/25/2015 at Unknown time  . diphenoxylate-atropine (LOMOTIL) 2.5-0.025 MG tablet 1 tablet once per day if greater than one episode of diarrhea. (Patient not taking: Reported on 12/26/2015) 10 tablet 0 Not Taking at Unknown time   Scheduled:  . [START ON 12/27/2015] allopurinol  100 mg Oral Daily  . [START ON 12/27/2015] amiodarone  200 mg Oral Daily  . [START ON 12/27/2015] atorvastatin  40 mg Oral q1800  . [START ON 12/27/2015] carvedilol  3.125 mg Oral BID WC  . [START ON 12/27/2015] clopidogrel  75 mg Oral Q breakfast  . [START ON 12/27/2015] collagenase  1 application  Topical Daily  . [START ON 12/27/2015] DULoxetine  30 mg Oral Daily  . [START ON 12/27/2015] feeding supplement (NEPRO CARB STEADY)  237 mL Oral BID BM  . [START ON 12/29/2015] midodrine  10 mg Oral Q M,W,F  . [START ON 12/27/2015] multivitamin with minerals  1 tablet Oral Daily  . [START ON 12/27/2015] silver sulfADIAZINE  1 application Topical Daily  . sodium chloride flush  3 mL Intravenous Q12H    Assessment: 65 yoM, pharmacy consulted to dose heparin for Afib. Plan is to start heparin when INR is < 2. Pt on warfarin PTA and has been admitted s/p placement of DES in the peroneal artery that now has evidence of stent occlusion on doppler studies. Plan is to transition off Coumadin in preparation for another attempt at revascularization  Goal of Therapy:  Heparin level 0.3-0.7 units/ml Monitor platelets by anticoagulation protocol: Yes   Plan:  Once INR is < 2, plan to start heparin infusion at 1000 units/hr Once started, check anti-Xa level in 8 hours and daily while on heparin Continue to monitor INR, H&H and platelets  Thank you for allowing [place] to participate in this patients care. Jens Som, PharmD Pager: [phone] 12/26/2015,9:10 PM

## 2015-12-26 NOTE — ED Provider Notes (Signed)
Patient not evaluated by myself. Sent for direct admit, not ED evaluation.  Charlesetta Shanks, MD 12/26/15 [phone]

## 2015-12-26 NOTE — ED Notes (Signed)
Per EMS - pt had stents placed in left leg and pt nephrologist is concerned stents might be occluded. Pt is coming from dialysis, completed treatment today. Pt has no complaints. Paced rhythm. Denies cp/shortness of breath/n/v/d. BP 218/195. Normally SBP 90 per dialysis center.

## 2015-12-26 NOTE — H&P (Signed)
History and Physical   Patient ID: [name] MRN: [id number], DOB/AGE: 01-04-1930 82 y.o. Date of Encounter: 12/26/2015  Primary Physician: [name], MD Primary Cardiologist: Dr. Terrence Dupont Primary PV: Dr. Quay Burow Primary EP: Dr. Cristopher Peru  Chief Complaint:  LE ulcer, PAD, stent occlusion  HPI: [name] is an 82 y.o. male with a history of CAD (ant-lat MI s/p PTCA/DES to LAD 2006), HTN, HLD, ESRD on HD, paroxysmal atrial fib on amiodarone/Coumadin (NSR by interrogation 10/2015), ICM/chronic systolic CHF (s/p ICD with upgrade to Medtronic BiV-ICD 07/2015 when it had reached ERI), GIB 03/2015 in setting of elevated INR, WCT felt to be atrial tachycardia 06/2015, anemia of chronic disease, hypothyroidism who presented to [facility] today initially for planned PV angio. He recently saw podiatry for a left heel ulcer which has been present for the last month. Dopplers performed on 12/05/15 revealed occluded tibials bilaterally.   He was admitted from 02/20>>02/24 for PV cath with PTA to the L proximal peroneal artery, results are below.   Hospital course was complicated by supratherapeutic INR, he was given 10 mg vitamin K prior to the procedure to help reverse INR. Postprocedure, he was placed on Coumadin and Plavix without aspirin to lessen the bleeding risk. Nephrology was consulted to help manage his dialysis. His digoxin was stopped given end-stage renal disease.  He had a new confusion in the morning of 12/18/2015. EEG was done on 12/18/2015 which showed diffuse cerebral dysfunction that is nonspecific in etiology and can be seen with hypoxic/ischemic, toxic/metabolic encephalopathies, neurodegenerative disorders or medication effect. Patient was seen by physical therapy who recommended skilled nursing facility placement. Patient was accepted by [facility] and discharged there on 02/24.  Yesterday, pt had ABIs performed. Dr Fletcher Anon reviewed the study and it showed an  occluded stent. Because of the critical limb ischemia and the non-heeling wound on his foot, he is here for heparin once his INR is < 2.0 and angiogram on Monday.   [name] cannot give many details about what has happened since d/c. He thinks his foot got worse 2 days ago, but says this was because they have not changed the bandage. He has not been walking and has not had a great deal of pain in his foot. He has no other complaints. His dialysis is M-W-F and he had dialysis today.  Past Medical History  Diagnosis Date  . Dyslipidemia   . Chronic systolic CHF (congestive heart failure) ([place])     a. s/p ICD with upgrade to Medtronic BiV-ICD 07/2015 when it had reached ERI.  . Paroxysmal atrial fibrillation ([place])   . Hypothyroidism   . Coronary artery disease     a. ant-lat MI s/p PTCA/DES to LAD 2006.  Marland Kitchen Hypertension   . Ischemic cardiomyopathy     a. s/p MDT CRTD  . AICD (automatic cardioverter/defibrillator) present   . Myocardial infarction ([place])   . Depression     hx  . History of blood transfusion 03/2015    LGIB  . GI bleed     a. 03/2015 in setting of elevated INR.  Marland Kitchen Arthritis     "everywhere"  . History of gout   . ESRD (end stage renal disease) on dialysis [facility])     "[place]; MWF" (07/10/2015)  . Prostate cancer ([place])     S/P seed implants  . Critical lower limb ischemia   . LBBB (left bundle branch block)   . Atrial tachycardia (  [place])     a. WCT 06/2015 felt to be atrial tach.  . Anemia of chronic disease   . PAD (peripheral artery disease) [facility])     Surgical History:  Past Surgical History  Procedure Laterality Date  . Medtronic biv icd  10/2006    Dr Lovena Le  . Total knee arthroplasty Right   . Av fistula placement  10/13/2012    Procedure: ARTERIOVENOUS (AV) FISTULA CREATION;  Surgeon: Mal Misty, MD;  Location: [place];  Service: Vascular;  Laterality: Right;  Creation right arm arteriovenous fistula   . Insertion of dialysis catheter  10/13/2012     Procedure: INSERTION OF DIALYSIS CATHETER;  Surgeon: Mal Misty, MD;  Location: [place];  Service: Vascular;  Laterality: N/A;  right internal jugular vein  . Ligation of competing branches of arteriovenous fistula  11/24/2012    Procedure: LIGATION OF COMPETING BRANCHES OF ARTERIOVENOUS FISTULA;  Surgeon: Rosetta Posner, MD;  Location: [facility] OR;  Service: Vascular;  Laterality: Right;  ligation of competing branch right arteriovenous fistula  . Av fistula placement Left 01/08/2013    Procedure: ARTERIOVENOUS (AV) FISTULA CREATION;  Surgeon: Elam Dutch, MD;  Location: [place];  Service: Vascular;  Laterality: Left;  . Cardioversion N/A 07/21/2012    Procedure: CARDIOVERSION;  Surgeon: Evans Lance, MD;  Location: [facility] CATH LAB;  Service: Cardiovascular;  Laterality: N/A;  . Esophagogastroduodenoscopy Left 04/09/2015    Procedure: ESOPHAGOGASTRODUODENOSCOPY (EGD);  Surgeon: Laurence Spates, MD;  Location: [facility] ENDOSCOPY;  Service: Endoscopy;  Laterality: Left;  . Colonoscopy N/A 04/10/2015    Procedure: COLONOSCOPY;  Surgeon: Laurence Spates, MD;  Location: [place];  Service: Endoscopy;  Laterality: N/A;  . Joint replacement    . Biv icd genertaor change out  X 1    "replaced the battery"  . Coronary angioplasty with stent placement  02/2005    PTCA and DES to LAD Dr Terrence Dupont  . Insertion prostate radiation seed    . Ep implantable device N/A 08/07/2015    Procedure: ICD/BIV ICD Generator Changeout;  Surgeon: Evans Lance, MD;  Location: [place] CV LAB;  Service: Cardiovascular;  Laterality: N/A;  . Peripheral vascular catheterization N/A 12/17/2015    Procedure: Lower Extremity Intervention;  Surgeon: Wellington Hampshire, MD;  Location: [place] CV LAB;  Service: Cardiovascular;  Laterality: N/A;  Bilateral leg runoff performed  . Peripheral vascular catheterization N/A 12/17/2015    Procedure: Abdominal Aortogram;  Surgeon: Wellington Hampshire, MD;  Location: [place] CV LAB;  Service:  Cardiovascular;  Laterality: N/A;  . Peripheral vascular catheterization Left 12/17/2015    Procedure: Peripheral Vascular Intervention;  Surgeon: Wellington Hampshire, MD;  Location: [place] CV LAB;  Service: Cardiovascular;  Laterality: Left;  peroneal artery     I have reviewed the patient's current medications. Medication Sig  allopurinol (ZYLOPRIM) 100 MG tablet Take 100 mg by mouth daily.   amiodarone (PACERONE) 200 MG tablet Take 200 mg by mouth daily.   atorvastatin (LIPITOR) 40 MG tablet Take 40 mg by mouth daily.    carvedilol (COREG) 3.125 MG tablet Take 1 tablet (3.125 mg total) by mouth 2 (two) times daily with a meal.  clopidogrel (PLAVIX) 75 MG tablet Take 1 tablet (75 mg total) by mouth daily with breakfast.  collagenase (SANTYL) ointment Apply 1 application topically daily. Patient taking differently: Apply 1 application topically daily as needed (for wound healing).   diphenoxylate-atropine (LOMOTIL) 2.5-0.025 MG tablet 1 tablet once  per day if greater than one episode of diarrhea. Patient taking differently: Take 1 tablet by mouth daily as needed for diarrhea or loose stools. 1 tablet once per day if greater than one episode of diarrhea.  DULoxetine (CYMBALTA) 30 MG capsule Take 30 mg by mouth daily.  ethyl chloride spray Apply 1 application topically as needed. (prior to dialysis)  midodrine (PROAMATINE) 10 MG tablet Take 10 mg every Monday, Wednesday, and Friday with hemodialysis, plus 1 TABLET AS NEEDED  Multiple Vitamin (MULTIVITAMIN WITH MINERALS) TABS Take 1 tablet by mouth daily. 50+  Multiple Vitamins-Minerals (DECUBI-VITE) CAPS Take 1 capsule by mouth daily.  Nutritional Supplements (FEEDING SUPPLEMENT, NEPRO CARB STEADY,) LIQD Take 237 mLs by mouth 2 (two) times daily between meals.  silver sulfADIAZINE (SILVADENE) 1 % cream Apply 1 application topically daily.  warfarin (COUMADIN) 3 MG tablet Take 1.5-3 mg by mouth daily at 6 PM. Take as directed by the Coumadin  clinic  1.5 mg on Sunday and 3 mg all other days   Allergies: No Known Allergies  Social History   Social History  . Marital Status: Married    Spouse Name: N/A  . Number of Children: N/A  . Years of Education: N/A   Occupational History  . retired    Social History Main Topics  . Smoking status: Former Smoker -- 10 years    Types: Cigarettes, Cigars  . Smokeless tobacco: Never Used     Comment: stopped smoking in 1970  . Alcohol Use: Yes     Comment: "stopped drinking alcohol in the 1980's"  . Drug Use: No  . Sexual Activity: Not on file   Other Topics Concern  . Not on file   Social History Narrative    Family History  Problem Relation Age of Onset  . Diabetes Mother    Family Status  Relation Status Death Age  . Mother Deceased   . Father Deceased   . Paternal Grandfather Deceased   . Paternal Grandmother Deceased   . Maternal Grandfather Deceased   . Maternal Grandmother Deceased     Review of Systems:   Full 14-point review of systems otherwise negative except as noted above.  Physical Exam: Blood pressure 107/49, pulse 84, temperature 98.2 F (36.8 C), temperature source Oral, resp. rate 16, SpO2 98 %. General: Well developed, slender, elderly ,male in no acute distress. Head: Normocephalic, atraumatic, sclera non-icteric, no xanthomas, nares are without discharge. Dentition: poor Neck: No carotid bruits. JVD not elevated. No thyromegally Lungs: Good expansion bilaterally. without wheezes or rhonchi.  Heart: Regular rate and rhythm with S1 S2.  No S3 or S4.  2/6 murmur, no rubs, or gallops appreciated. Abdomen: Soft, non-tender, non-distended with normoactive bowel sounds. No hepatomegaly. No rebound/guarding. No obvious abdominal masses. Msk:  Strength and tone appear weak for age. No joint deformities or effusions. Extremities: No clubbing or cyanosis. No edema.  Distal pedal pulses are 2+ in upper extrem. DP pulses are barely palpable bilaterally,  femoral pulses are palpable, R>L with bruits. Neuro: Alert and oriented X 1.5. Moves all extremities spontaneously. No focal deficits noted. Psych:  Responds to questions appropriately with a normal affect. Skin: No rashes or lesions noted  Labs:   Lab Results  Component Value Date   WBC 7.8 12/19/2015   HGB 9.4* 12/19/2015   HCT 29.3* 12/19/2015   MCV 82.3 12/19/2015   PLT 195 12/19/2015   Lab Results  Component Value Date   CHOL 75 12/16/2015   HDL  24* 12/16/2015   LDLCALC 41 12/16/2015   TRIG 50 12/16/2015     PV Cath: 12/17/2015 Conclusion    1. No significant aortoiliac disease. 2. One-vessel runoff below the knee on the right side via the peroneal artery which has mild proximal disease. 3. One-vessel runoff below the knee on the left side via the peroneal artery which has 90% proximal disease. 4. Successful angioplasty and drug-eluting stent placement to the proximal peroneal artery.  Recommendations: Warfarin can be resumed today. If needed, heparin can be resumed tomorrow if there is no groin bleeding complication. The patient did not achieve hemostasis with a mynx closure device. Continue Plavix with warfarin without aspirin to decrease the risk of bleeding. Plavix should be continued for one year. Continue wound care.        ASSESSMENT AND PLAN:  Principal Problem:   Critical lower limb ischemia - pulses are Dopplerable, but recently placed stent is occluded - admit, hold coumadin, heparin when INR < 2.0 - PV angio on Monday if INR low enough. - MD advise on continuing Plavix  Active Problems:   PAD (peripheral artery disease) ([place]) - see above    Chronic systolic heart failure ([place]) - follow I/O and weights - continue home rx, no sx volume overload now    End stage renal disease ([place]) - HD on M-W-F - Renal team to see and manage, can call them in am    PAF (paroxysmal atrial fibrillation) ([place]) - follow on telemetry    Essential  hypertension - good control now.  Augusto Garbe 12/26/2015 7:44 PM Beeper [id number]   Attending note:  Patient seen and examined. Reviewed records, agree with above assessment by Ms. Barrett PA-C. Mr. Holliday is medically complex 82 year old male recently hospitalized with nonhealing ulcer on the left heel and status post placement of DES in the peroneal artery on that side by Dr. Fletcher Anon. He has been convales[facility] at [facility] since discharge, has had worsening left foot pain the last few days. Follow-up arterial Doppler studies today showed evidence of stent occlusion in the left peroneal artery. He is now admitted to transition off Coumadin in preparation for another attempt at revascularization. I do not have access to his most recent INR.  Examination he is a frail elderly male in no distress. Afebrile, heart rate in the [id number], systolic blood pressure between 100 and 110. Lungs exhibit decreased breath sounds but nonlabored breathing. Cardiac exam reveals RRR without gallop. He has a sacral pressure ulcer, his left ankle and foot is bandaged. He has decreased distal pulses. Lab work is pending at present. INR was 1.8 as of February 24. Last hemoglobin 9.4.  Patient presents with evidence of occluded peroneal artery stent, poorly healing foot ulcer on that side, and foot pain. He is admitted to transition off Coumadin, initiate heparin drip when INR is under 2.0, in anticipation of repeat revascularization attempt. He needs to have wound care consultation to manage pressor ulcer and left foot ulcer. Otherwise plan to continue his regular cardiac regimen.  Satira Sark, M.D., F.A.C.C.

## 2015-12-26 NOTE — Progress Notes (Signed)
The patient was hospitalized recently for nonhealing ulcer on left heel. Angiography showed 1 vessel run below the knee on the left via the peroneal artery which had a 90% proximal stenosis. I proceeded with angioplasty and DES placement to the peroneal artery.  He had a duplex study done yesterday which showed that the stent was occluded.  Thus, I recommended hospital admission. Check labs. Hold Warfarin. Start IV Heparin once INR is below 2. Plan repeat angiography on Monday by Dr. Gwenlyn Found if INR <1.8.

## 2015-12-27 LAB — PROTIME-INR
INR: 2.22 — AB (ref 0.00–1.49)
INR: 2.07 — ABNORMAL HIGH (ref 0.00–1.49)
PROTHROMBIN TIME: 24.4 s — AB (ref 11.6–15.2)
Prothrombin Time: 23.1 seconds — ABNORMAL HIGH (ref 11.6–15.2)

## 2015-12-27 MED ORDER — HEPARIN (PORCINE) IN NACL 100-0.45 UNIT/ML-% IJ SOLN
1600.0000 [IU]/h | INTRAMUSCULAR | Status: DC
  Administered 2015-12-27: 1000 [IU]/h via INTRAVENOUS
  Administered 2015-12-29: 1450 [IU]/h via INTRAVENOUS
  Administered 2015-12-30: 1700 [IU]/h via INTRAVENOUS
  Administered 2015-12-30: 1550 [IU]/h via INTRAVENOUS
  Administered 2015-12-31 – 2016-01-02 (×4): 1700 [IU]/h via INTRAVENOUS
  Administered 2016-01-03: 1600 [IU]/h via INTRAVENOUS
  Filled 2015-12-27 (×11): qty 250

## 2015-12-27 MED ORDER — DOXERCALCIFEROL 4 MCG/2ML IV SOLN
5.0000 ug | INTRAVENOUS | Status: DC
  Administered 2015-12-29 – 2016-01-05 (×4): 5 ug via INTRAVENOUS
  Filled 2015-12-27 (×4): qty 4

## 2015-12-27 MED ORDER — PHYTONADIONE 5 MG PO TABS
5.0000 mg | ORAL_TABLET | Freq: Once | ORAL | Status: AC
  Administered 2015-12-27: 5 mg via ORAL
  Filled 2015-12-27: qty 1

## 2015-12-27 NOTE — Progress Notes (Signed)
   SUBJECTIVE: He reports mild left foot pain but he is bothered more by pain from sacral decubitus ulcers.    Filed Vitals:   12/26/15 1830 12/26/15 1948 12/27/15 0525  BP: 107/49 105/51 99/68  Pulse: 84 70 69  Temp: 98.2 F (36.8 C) 97.4 F (36.3 C) 98 F (36.7 C)  TempSrc: Oral Oral Oral  Resp: 16 18 18   Weight:  151 lb 10.8 oz (68.8 kg)   SpO2: 98% 100% 99%   No intake or output data in the 24 hours ending 12/27/15 0759  LABS: Basic Metabolic Panel:  Recent Labs  12/26/15 2102  NA 138  K 3.2*  CL 96*  CO2 28  GLUCOSE 112*  BUN 12  CREATININE 2.71*  CALCIUM 8.4*   Liver Function Tests: No results for input(s): AST, ALT, ALKPHOS, BILITOT, PROT, ALBUMIN in the last 72 hours. No results for input(s): LIPASE, AMYLASE in the last 72 hours. CBC:  Recent Labs  12/26/15 2102  WBC 8.9  NEUTROABS 6.3  HGB 10.8*  HCT 34.3*  MCV 82.3  PLT 196   Cardiac Enzymes: No results for input(s): CKTOTAL, CKMB, CKMBINDEX, TROPONINI in the last 72 hours. BNP: Invalid input(s): POCBNP D-Dimer: No results for input(s): DDIMER in the last 72 hours. Hemoglobin A1C: No results for input(s): HGBA1C in the last 72 hours. Fasting Lipid Panel: No results for input(s): CHOL, HDL, LDLCALC, TRIG, CHOLHDL, LDLDIRECT in the last 72 hours. Thyroid Function Tests: No results for input(s): TSH, T4TOTAL, T3FREE, THYROIDAB in the last 72 hours.  Invalid input(s): FREET3 Anemia Panel: No results for input(s): VITAMINB12, FOLATE, FERRITIN, TIBC, IRON, RETICCTPCT in the last 72 hours.   PHYSICAL EXAM General: Well developed, well nourished, in no acute distress HEENT:  Normocephalic and atramatic Neck:  No JVD.  Lungs: Clear bilaterally to auscultation and percussion. Heart: HRRR . Normal S1 and S2 without gallops or murmurs.  Abdomen: Bowel sounds are positive, abdomen soft and non-tender  Msk:  Back normal, normal gait. Normal strength and tone for age. Extremities: No clubbing,  cyanosis or edema.   Neuro: Alert and oriented X 3. Psych:  Good affect, responds appropriately Vascular: femoral pulses are normal. Distal pulses are not palpable. Left foot is warm to touch and wrapped.    ASSESSMENT AND PLAN:  Critical lower limb ischemia - pulses are Dopplerable, but recently placed peroneal stent is occluded which is the only run off to the foot. Both posterior and anterior tibial arteries are known to be occluded.  - I am going to give one dose of oral vitamin K 5 mg. Start Heparin once INR <2 - PV angio on Monday by Dr. Gwenlyn Found if INR low enough. -  Continue Plavix.  - Continue wound care.    Chronic systolic heart failure ([facility]) - follow I/O and weights - continue home rx, no sx volume overload now   End stage renal disease ([place]) - HD on M-W-F -  I consulted Dr. Justin Mend for routine dialysis .    PAF (paroxysmal atrial fibrillation) ([place]) - follow on telemetry   Essential hypertension - good control now.  Kathlyn Sacramento, MD, [facility] 12/27/2015 7:59 AM

## 2015-12-27 NOTE — Consult Note (Signed)
[facility] Renal Consultation Note    Indication for Consultation:  Management of ESRD/hemodialysis; anemia, hypertension/volume and secondary hyperparathyroidism PCP:  HPI: [name] is a 82 y.o. male with ESRD on hemodialysis at [facility] on MWF. Past medical history significant for chronic systolic HF, paroxysmal atrial fibrillation, CAD with PTA/DES to LAD 2006, ischemic cardiomyopathy, AICD placement, MI, hypertension, PAD, arthritis, GI bleed, prostate cancer, critical lower limb ischemia, anemia of chronic disease, secondary hyperparathyroidism, hypothyroidism, altered mental status, nonhealing ulcer L heel, pressure ulcer R hip.  He is currently in rehab at [facility] for nonhealing ulcer of foot/physical therapy. He had recent PV cath with PTA to the L proximal peroneal artery 12/15/15. Yesterday he had ABIs done as OP which suggested occluded stent. He was brought to the hospital per cardiology for critical limb ischemia.  He is to be started on heparin drip when INR < 2.0 per pharmacy and has been scheduled for angiogram Monday.   Currently [name] C/O pain in heel, denies chest pain, SOB, fevers, chills, nausea, vomiting, diarrhea, abdominal pain, headache, vision changes, hearing loss or changes. He is talking with family members.  His last hemodialysis treatment was Friday. He stayed entire treatment and is generally compliant with HD prescription. Last in-center lab values as follows: Ca 8.8 Phos 2.9 (12/03/15) PTH 177 (11/19/15) HGB 10.6 (12/24/15) Ferritin 1105 Fe 31 Tsat  20%-rec'd Fe load. (11/19/15)  Past Medical History  Diagnosis Date  . Dyslipidemia   . Chronic systolic CHF (congestive heart failure) ([place])     a. s/p ICD with upgrade to Medtronic BiV-ICD 07/2015 when it had reached ERI.  . Paroxysmal atrial fibrillation ([place])   . Hypothyroidism   . Coronary artery disease     a. ant-lat MI s/p PTCA/DES to LAD 2006.  Marland Kitchen Hypertension    . Ischemic cardiomyopathy     a. s/p MDT CRTD  . AICD (automatic cardioverter/defibrillator) present   . Myocardial infarction ([place])   . Depression     hx  . History of blood transfusion 03/2015    LGIB  . GI bleed     a. 03/2015 in setting of elevated INR.  Marland Kitchen Arthritis     "everywhere"  . History of gout   . ESRD (end stage renal disease) on dialysis [facility])     "[place]; MWF" (07/10/2015)  . Prostate cancer ([place])     S/P seed implants  . Critical lower limb ischemia   . LBBB (left bundle branch block)   . Atrial tachycardia ([place])     a. WCT 06/2015 felt to be atrial tach.  . Anemia of chronic disease   . PAD (peripheral artery disease) [facility])    Past Surgical History  Procedure Laterality Date  . Medtronic biv icd  10/2006    Dr Lovena Le  . Total knee arthroplasty Right   . Av fistula placement  10/13/2012    Procedure: ARTERIOVENOUS (AV) FISTULA CREATION;  Surgeon: Mal Misty, MD;  Location: [place];  Service: Vascular;  Laterality: Right;  Creation right arm arteriovenous fistula   . Insertion of dialysis catheter  10/13/2012    Procedure: INSERTION OF DIALYSIS CATHETER;  Surgeon: Mal Misty, MD;  Location: [place];  Service: Vascular;  Laterality: N/A;  right internal jugular vein  . Ligation of competing branches of arteriovenous fistula  11/24/2012    Procedure: LIGATION OF COMPETING BRANCHES OF ARTERIOVENOUS FISTULA;  Surgeon: Rosetta Posner, MD;  Location: [facility]  OR;  Service: Vascular;  Laterality: Right;  ligation of competing branch right arteriovenous fistula  . Av fistula placement Left 01/08/2013    Procedure: ARTERIOVENOUS (AV) FISTULA CREATION;  Surgeon: Elam Dutch, MD;  Location: [place];  Service: Vascular;  Laterality: Left;  . Cardioversion N/A 07/21/2012    Procedure: CARDIOVERSION;  Surgeon: Evans Lance, MD;  Location: [facility] CATH LAB;  Service: Cardiovascular;  Laterality: N/A;  . Esophagogastroduodenoscopy Left 04/09/2015    Procedure:  ESOPHAGOGASTRODUODENOSCOPY (EGD);  Surgeon: Laurence Spates, MD;  Location: [facility] ENDOSCOPY;  Service: Endoscopy;  Laterality: Left;  . Colonoscopy N/A 04/10/2015    Procedure: COLONOSCOPY;  Surgeon: Laurence Spates, MD;  Location: [place];  Service: Endoscopy;  Laterality: N/A;  . Joint replacement    . Biv icd genertaor change out  X 1    "replaced the battery"  . Coronary angioplasty with stent placement  02/2005    PTCA and DES to LAD Dr Terrence Dupont  . Insertion prostate radiation seed    . Ep implantable device N/A 08/07/2015    Procedure: ICD/BIV ICD Generator Changeout;  Surgeon: Evans Lance, MD;  Location: [place] CV LAB;  Service: Cardiovascular;  Laterality: N/A;  . Peripheral vascular catheterization N/A 12/17/2015    Procedure: Lower Extremity Intervention;  Surgeon: Wellington Hampshire, MD;  Location: [place] CV LAB;  Service: Cardiovascular;  Laterality: N/A;  Bilateral leg runoff performed  . Peripheral vascular catheterization N/A 12/17/2015    Procedure: Abdominal Aortogram;  Surgeon: Wellington Hampshire, MD;  Location: [place] CV LAB;  Service: Cardiovascular;  Laterality: N/A;  . Peripheral vascular catheterization Left 12/17/2015    Procedure: Peripheral Vascular Intervention;  Surgeon: Wellington Hampshire, MD;  Location: [place] CV LAB;  Service: Cardiovascular;  Laterality: Left;  peroneal artery   Family History  Problem Relation Age of Onset  . Diabetes Mother    Social History:  reports that he has quit smoking. His smoking use included Cigarettes and Cigars. He quit after 10 years of use. He has never used smokeless tobacco. He reports that he drinks alcohol. He reports that he does not use illicit drugs. No Known Allergies Prior to Admission medications   Medication Sig Start Date End Date Taking? Authorizing Provider  allopurinol (ZYLOPRIM) 100 MG tablet Take 100 mg by mouth daily.  06/12/12  Yes Historical Provider, MD  amiodarone (PACERONE) 200 MG tablet Take  200 mg by mouth daily.    Yes Historical Provider, MD  atorvastatin (LIPITOR) 40 MG tablet Take 40 mg by mouth daily at 6 PM.    Yes Historical Provider, MD  carvedilol (COREG) 3.125 MG tablet Take 1 tablet (3.125 mg total) by mouth 2 (two) times daily with a meal. 07/11/15  Yes Dixie Dials, MD  clopidogrel (PLAVIX) 75 MG tablet Take 1 tablet (75 mg total) by mouth daily with breakfast. 12/19/15  Yes Almyra Deforest, PA  collagenase (SANTYL) ointment Apply 1 application topically daily. 11/18/15  Yes Trula Slade, DPM  DULoxetine (CYMBALTA) 30 MG capsule Take 30 mg by mouth daily. 12/01/15  Yes Historical Provider, MD  ethyl chloride spray Apply 1 application topically as needed. (prior to dialysis) 10/08/15  Yes Historical Provider, MD  midodrine (PROAMATINE) 10 MG tablet Take 10 mg by mouth every Monday, Wednesday, and Friday.  10/11/15  Yes Historical Provider, MD  Multiple Vitamin (MULTIVITAMIN WITH MINERALS) TABS Take 1 tablet by mouth daily. 50+   Yes Historical Provider, MD  Multiple Vitamins-Minerals (DECUBI-VITE)  CAPS Take 1 capsule by mouth daily.   Yes Historical Provider, MD  Nutritional Supplements (FEEDING SUPPLEMENT, NEPRO CARB STEADY,) LIQD Take 237 mLs by mouth 2 (two) times daily between meals. 12pm, 5pm   Yes Historical Provider, MD  silver sulfADIAZINE (SILVADENE) 1 % cream Apply 1 application topically daily. 11/19/15  Yes Trula Slade, DPM  warfarin (COUMADIN) 3 MG tablet Take 1.5-3 mg by mouth daily at 6 PM. Take 1/2 tablet (1.5 mg) by mouth on Sunday evening, take 1 tablet (3 mg) on all other days of the week 10/21/15  Yes Historical Provider, MD  diphenoxylate-atropine (LOMOTIL) 2.5-0.025 MG tablet 1 tablet once per day if greater than one episode of diarrhea. Patient not taking: Reported on 12/26/2015 11/25/15   Tanna Furry, MD   Current Facility-Administered Medications  Medication Dose Route Frequency Provider Last Rate Last Dose  . 0.9 %  sodium chloride infusion  250 mL  Intravenous PRN Rhonda G Barrett, PA-C      . acetaminophen (TYLENOL) tablet 650 mg  650 mg Oral Q6H PRN Rhonda G Barrett, PA-C       Or  . acetaminophen (TYLENOL) suppository 650 mg  650 mg Rectal Q6H PRN Rhonda G Barrett, PA-C      . allopurinol (ZYLOPRIM) tablet 100 mg  100 mg Oral Daily Rhonda G Barrett, PA-C   100 mg at 12/27/15 1100  . amiodarone (PACERONE) tablet 200 mg  200 mg Oral Daily Rhonda G Barrett, PA-C   200 mg at 12/27/15 1100  . atorvastatin (LIPITOR) tablet 40 mg  40 mg Oral q1800 Rhonda G Barrett, PA-C      . calcium carbonate (dosed in mg elemental calcium) suspension 500 mg of elemental calcium  500 mg of elemental calcium Oral Q6H PRN Rhonda G Barrett, PA-C      . camphor-menthol (SARNA) lotion 1 application  1 application Topical [id number] PRN Rhonda G Barrett, PA-C       And  . hydrOXYzine (ATARAX/VISTARIL) tablet 25 mg  25 mg Oral Q8H PRN Rhonda G Barrett, PA-C      . carvedilol (COREG) tablet 3.125 mg  3.125 mg Oral BID WC Rhonda G Barrett, PA-C   3.125 mg at 12/27/15 0900  . clopidogrel (PLAVIX) tablet 75 mg  75 mg Oral Q breakfast Evelene Croon Barrett, PA-C   75 mg at 12/27/15 0900  . collagenase (SANTYL) ointment 1 application  1 application Topical Daily Rhonda G Barrett, PA-C      . docusate sodium (ENEMEEZ) enema 283 mg  1 enema Rectal PRN Evelene Croon Barrett, PA-C      . DULoxetine (CYMBALTA) DR capsule 30 mg  30 mg Oral Daily Rhonda G Barrett, PA-C   30 mg at 12/27/15 1100  . feeding supplement (NEPRO CARB STEADY) liquid 237 mL  237 mL Oral TID PRN Evelene Croon Barrett, PA-C      . feeding supplement (NEPRO CARB STEADY) liquid 237 mL  237 mL Oral BID BM Rhonda G Barrett, PA-C   237 mL at 12/27/15 1000  . [START ON 12/29/2015] midodrine (PROAMATINE) tablet 10 mg  10 mg Oral Q M,W,F Rhonda G Barrett, PA-C      . multivitamin with minerals tablet 1 tablet  1 tablet Oral Daily Rhonda G Barrett, PA-C   1 tablet at 12/27/15 1100  . nitroGLYCERIN (NITROSTAT) SL tablet 0.4 mg  0.4 mg  Sublingual Q5 Min x 3 PRN Rhonda G Barrett, PA-C      . ondansetron (ZOFRAN)  tablet 4 mg  4 mg Oral Q6H PRN Rhonda G Barrett, PA-C       Or  . ondansetron (ZOFRAN) injection 4 mg  4 mg Intravenous Q6H PRN Rhonda G Barrett, PA-C      . silver sulfADIAZINE (SILVADENE) 1 % cream 1 application  1 application Topical Daily Rhonda G Barrett, PA-C      . sodium chloride flush (NS) 0.9 % injection 3 mL  3 mL Intravenous Q12H Rhonda G Barrett, PA-C   3 mL at 12/27/15 1000  . sodium chloride flush (NS) 0.9 % injection 3 mL  3 mL Intravenous PRN Rhonda G Barrett, PA-C      . sorbitol 70 % solution 30 mL  30 mL Oral PRN Rhonda G Barrett, PA-C      . zolpidem (AMBIEN) tablet 5 mg  5 mg Oral QHS PRN Lonn Georgia, PA-C       Labs: Basic Metabolic Panel:  Recent Labs Lab 12/26/15 2102  NA 138  K 3.2*  CL 96*  CO2 28  GLUCOSE 112*  BUN 12  CREATININE 2.71*  CALCIUM 8.4*   Liver Function Tests: No results for input(s): AST, ALT, ALKPHOS, BILITOT, PROT, ALBUMIN in the last 168 hours. No results for input(s): LIPASE, AMYLASE in the last 168 hours. No results for input(s): AMMONIA in the last 168 hours. CBC:  Recent Labs Lab 12/26/15 2102  WBC 8.9  NEUTROABS 6.3  HGB 10.8*  HCT 34.3*  MCV 82.3  PLT 196   Cardiac Enzymes: No results for input(s): CKTOTAL, CKMB, CKMBINDEX, TROPONINI in the last 168 hours. CBG: No results for input(s): GLUCAP in the last 168 hours. Iron Studies: No results for input(s): IRON, TIBC, TRANSFERRIN, FERRITIN in the last 72 hours. Studies/Results: No results found.  ROS: As per HPI otherwise negative.   Physical Exam: Filed Vitals:   12/26/15 1830 12/26/15 1948 12/27/15 0525  BP: 107/49 105/51 99/68  Pulse: 84 70 69  Temp: 98.2 F (36.8 C) 97.4 F (36.3 C) 98 F (36.7 C)  TempSrc: Oral Oral Oral  Resp: 16 18 18   Weight:  68.8 kg (151 lb 10.8 oz)   SpO2: 98% 100% 99%     General: Well developed, well nourished, in no acute distress. Head:  Normocephalic, atraumatic, sclera non-icteric, mucus membranes are moist Neck: Supple. JVD not elevated. Lungs: Clear bilaterally to auscultation without wheezes, rales, or rhonchi. Breathing is unlabored. Heart: RRR with S1 S2. No murmurs, rubs, or gallops appreciated. Abdomen: Soft, non-tender, non-distended with normoactive bowel sounds. No rebound/guarding. No obvious abdominal masses. M-S:  Strength and tone appear normal for age. Lower extremities:without edema or ischemic changes, no open wounds  Neuro: Alert and oriented X 3. Moves all extremities spontaneously. Psych:  Responds to questions appropriately with a normal affect. Dialysis Access: LUA AVF + bruit  Dialysis Orders: [facility]. MonWedFri, 4 hrs 0 min, 180NRe Optiflux, BFR 400, DFR Manual 800 mL/min, EDW 66.5 (kg), Dialysate 2.0 K, 2.25 Ca, UFR Profile: Profile 4, Sodium Model: Linear, Access: LUA AV Fistula Heparin: NO HEPARIN Mircera: 200 mcg IV Q 2 weeks ( last dose 12/24/15 HGB 10.6) Venofer: 50 mg IV weekly (last dose 12/24/15) Hectoral: 5 mcg IV q treatment   Assessment/Plan: 1.  Critical Limb ischemia: Per cardiology. For [place] cath Monday. Will have HD following cath.   2.  PAF: Per cards-coumadin on hold-to be started on heparin drip when INR < 2.0 3.  ESRD -  MWF Will  had HD Monday on schedule. K+3.2. Will use 4.0K bath, recheck labs prior to HD.  4.  Hypertension/volume  - BP slightly soft, controlled. On midodrine 10 mg PO prior to HD MWF. Will attempt 2-3 liters Monday as BP tolerates.  5.  Anemia  - HGB 10.8 rec'd ESA dose 12/24/15. Follow HGB 6.  Metabolic bone disease -  Ca 8.4 No binders, continue hectoral  7.  Nutrition - Renal diet, renal vitamin check albumin.  8. Nonhealing wound L heel/Pressure ulcer R Hip: Per primary, wound care following.   Bluford Sedler H. Owens Shark, NP-C 12/27/2015, 12:25 PM  [employer] [phone]

## 2015-12-27 NOTE — Progress Notes (Signed)
ANTICOAGULATION CONSULT NOTE - Initial Consult  Pharmacy Consult for heparin Indication: atrial fibrillation  No Known Allergies  Patient Measurements: Weight: 151 lb 10.8 oz (68.8 kg) Heparin Dosing Weight: 68.8  Vital Signs: Temp: 98.3 F (36.8 C) (03/04 1400) Temp Source: Oral (03/04 1400) BP: 100/43 mmHg (03/04 1400) Pulse Rate: 69 (03/04 1400)  Labs:  Recent Labs  12/26/15 2102 12/27/15 0416 12/27/15 1557  HGB 10.8*  --   --   HCT 34.3*  --   --   PLT 196  --   --   LABPROT 24.4* 24.4* 23.1*  INR 2.22* 2.22* 2.07*  CREATININE 2.71*  --   --     Estimated Creatinine Clearance: 20.1 mL/min (by C-G formula based on Cr of 2.71).   Medical History: Past Medical History  Diagnosis Date  . Dyslipidemia   . Chronic systolic CHF (congestive heart failure) ([place])     a. s/p ICD with upgrade to Medtronic BiV-ICD 07/2015 when it had reached ERI.  . Paroxysmal atrial fibrillation ([place])   . Hypothyroidism   . Coronary artery disease     a. ant-lat MI s/p PTCA/DES to LAD 2006.  Marland Kitchen Hypertension   . Ischemic cardiomyopathy     a. s/p MDT CRTD  . AICD (automatic cardioverter/defibrillator) present   . Myocardial infarction ([place])   . Depression     hx  . History of blood transfusion 03/2015    LGIB  . GI bleed     a. 03/2015 in setting of elevated INR.  Marland Kitchen Arthritis     "everywhere"  . History of gout   . ESRD (end stage renal disease) on dialysis [facility])     "[place]; MWF" (07/10/2015)  . Prostate cancer ([place])     S/P seed implants  . Critical lower limb ischemia   . LBBB (left bundle branch block)   . Atrial tachycardia ([place])     a. WCT 06/2015 felt to be atrial tach.  . Anemia of chronic disease   . PAD (peripheral artery disease) ([facility])     Medications:  Prescriptions prior to admission  Medication Sig Dispense Refill Last Dose  . allopurinol (ZYLOPRIM) 100 MG tablet Take 100 mg by mouth daily.    12/26/2015 at 800  . amiodarone (PACERONE) 200 MG tablet  Take 200 mg by mouth daily.    12/26/2015 at 800  . atorvastatin (LIPITOR) 40 MG tablet Take 40 mg by mouth daily at 6 PM.    12/25/2015 at Unknown time  . carvedilol (COREG) 3.125 MG tablet Take 1 tablet (3.125 mg total) by mouth 2 (two) times daily with a meal. 60 tablet 1 maybe 3/3 at 800  . clopidogrel (PLAVIX) 75 MG tablet Take 1 tablet (75 mg total) by mouth daily with breakfast. 90 tablet 3 12/26/2015 at 800  . collagenase (SANTYL) ointment Apply 1 application topically daily. 15 g 0 12/25/2015 at Unknown time  . DULoxetine (CYMBALTA) 30 MG capsule Take 30 mg by mouth daily.   12/26/2015 at 800  . ethyl chloride spray Apply 1 application topically as needed. (prior to dialysis)  12 12/26/2015 at Unknown time  . midodrine (PROAMATINE) 10 MG tablet Take 10 mg by mouth every Monday, Wednesday, and Friday.   5 12/26/2015 at 800  . Multiple Vitamin (MULTIVITAMIN WITH MINERALS) TABS Take 1 tablet by mouth daily. 50+   12/26/2015 at 800  . Multiple Vitamins-Minerals (DECUBI-VITE) CAPS Take 1 capsule by mouth daily.   12/26/2015 at 800  .  Nutritional Supplements (FEEDING SUPPLEMENT, NEPRO CARB STEADY,) LIQD Take 237 mLs by mouth 2 (two) times daily between meals. 12pm, 5pm   12/25/2015 at Unknown time  . silver sulfADIAZINE (SILVADENE) 1 % cream Apply 1 application topically daily. 50 g 3 12/25/2015 at Unknown time  . warfarin (COUMADIN) 3 MG tablet Take 1.5-3 mg by mouth daily at 6 PM. Take 1/2 tablet (1.5 mg) by mouth on Sunday evening, take 1 tablet (3 mg) on all other days of the week  3 12/25/2015 at Unknown time  . diphenoxylate-atropine (LOMOTIL) 2.5-0.025 MG tablet 1 tablet once per day if greater than one episode of diarrhea. (Patient not taking: Reported on 12/26/2015) 10 tablet 0 Not Taking at Unknown time   Scheduled:  . allopurinol  100 mg Oral Daily  . amiodarone  200 mg Oral Daily  . atorvastatin  40 mg Oral q1800  . carvedilol  3.125 mg Oral BID WC  . clopidogrel  75 mg Oral Q breakfast  . collagenase  1  application Topical Daily  . [START ON 12/29/2015] doxercalciferol  5 mcg Intravenous Q M,W,F-HD  . DULoxetine  30 mg Oral Daily  . feeding supplement (NEPRO CARB STEADY)  237 mL Oral BID BM  . [START ON 12/29/2015] midodrine  10 mg Oral Q M,W,F  . multivitamin with minerals  1 tablet Oral Daily  . silver sulfADIAZINE  1 application Topical Daily  . sodium chloride flush  3 mL Intravenous Q12H    Assessment: 45 yoM, pharmacy consulted to dose heparin for Afib. Plan is to start heparin when INR is < 2. Pt on warfarin PTA and has been admitted s/p placement of DES in the peroneal artery that now has evidence of stent occlusion on doppler studies. Plan is to transition off Coumadin in preparation for another attempt at revascularization  PM INR = 2.07  Goal of Therapy:  Heparin level 0.3-0.7 units/ml Monitor platelets by anticoagulation protocol: Yes   Plan:  Start heparin at 1000 units / hr AM heparin level, CBC  Thank you Anette Guarneri, PharmD [phone] 12/27/2015,5:34 PM

## 2015-12-28 DIAGNOSIS — R410 Disorientation, unspecified: Secondary | ICD-10-CM

## 2015-12-28 LAB — CBC
HCT: 32.1 % — ABNORMAL LOW (ref 39.0–52.0)
Hemoglobin: 10.4 g/dL — ABNORMAL LOW (ref 13.0–17.0)
MCH: 27.4 pg (ref 26.0–34.0)
MCHC: 32.4 g/dL (ref 30.0–36.0)
MCV: 84.5 fL (ref 78.0–100.0)
PLATELETS: 247 10*3/uL (ref 150–400)
RBC: 3.8 MIL/uL — ABNORMAL LOW (ref 4.22–5.81)
RDW: 20.7 % — AB (ref 11.5–15.5)
WBC: 8.6 10*3/uL (ref 4.0–10.5)

## 2015-12-28 LAB — PROTIME-INR
INR: 1.92 — ABNORMAL HIGH (ref 0.00–1.49)
PROTHROMBIN TIME: 21.9 s — AB (ref 11.6–15.2)

## 2015-12-28 LAB — HEPARIN LEVEL (UNFRACTIONATED): HEPARIN UNFRACTIONATED: 0.32 [IU]/mL (ref 0.30–0.70)

## 2015-12-28 NOTE — Progress Notes (Signed)
ANTICOAGULATION CONSULT NOTE Pharmacy Consult for heparin Indication: atrial fibrillation  No Known Allergies  Patient Measurements: Weight: 149 lb 7.6 oz (67.8 kg) Heparin Dosing Weight: 68.8  Vital Signs: Temp: 98.1 F (36.7 C) (03/05 0454) Temp Source: Oral (03/05 0454) BP: 100/50 mmHg (03/05 0454) Pulse Rate: 71 (03/05 0454)  Labs:  Recent Labs  12/26/15 2102 12/27/15 0416 12/27/15 1557 12/28/15 0505  HGB 10.8*  --   --  10.4*  HCT 34.3*  --   --  32.1*  PLT 196  --   --  247  LABPROT 24.4* 24.4* 23.1*  --   INR 2.22* 2.22* 2.07*  --   HEPARINUNFRC  --   --   --  <0.10*  CREATININE 2.71*  --   --   --     Estimated Creatinine Clearance: 19.8 mL/min (by C-G formula based on Cr of 2.71).  Assessment: 82 y.o. male with h/o Afib, Coumadin on hold, for heparin  Goal of Therapy:  Heparin level 0.3-0.7 units/ml Monitor platelets by anticoagulation protocol: Yes   Plan:  Increase Heparin 1250 units/hr Check heparin level in 8 hours.    Phillis Knack, PharmD, BCPS  12/28/2015,6:05 AM

## 2015-12-28 NOTE — Progress Notes (Signed)
ANTICOAGULATION CONSULT NOTE - Follow Up Consult  Pharmacy Consult for Heparin Indication: atrial fibrillation  No Known Allergies  Patient Measurements: Weight: 149 lb 7.6 oz (67.8 kg)  Vital Signs: Temp: 98.1 F (36.7 C) (03/05 1614) Temp Source: Oral (03/05 1614) BP: 100/46 mmHg (03/05 1614) Pulse Rate: 71 (03/05 1614)  Labs:  Recent Labs  12/26/15 2102 12/27/15 0416 12/27/15 1557 12/28/15 0505 12/28/15 0820 12/28/15 1359  HGB 10.8*  --   --  10.4*  --   --   HCT 34.3*  --   --  32.1*  --   --   PLT 196  --   --  247  --   --   LABPROT 24.4* 24.4* 23.1*  --  21.9*  --   INR 2.22* 2.22* 2.07*  --  1.92*  --   HEPARINUNFRC  --   --   --  <0.10*  --  0.32  CREATININE 2.71*  --   --   --   --   --     Estimated Creatinine Clearance: 19.8 mL/min (by C-G formula based on Cr of 2.71).   Medications:  Heparin @ 1250 units/hr  Assessment: 85yom on coumadin pta for afib, admitted with suspected peroneal artery stent occlusion. Coumadin held and he was started on IV heparin yesterday pending PV angio study on Monday. Heparin level is therapeutic at 0.32. CBC stable. No bleeding reported.  Goal of Therapy:  Heparin level 0.3-0.7 units/ml Monitor platelets by anticoagulation protocol: Yes   Plan:  1) Continue heparin at 1250 units/hr 2) Daily heparin level, CBC  Deboraha Sprang 12/28/2015,9:35 PM

## 2015-12-28 NOTE — Progress Notes (Signed)
Utilization Review Completed.[name] T3/02/2016  

## 2015-12-28 NOTE — Progress Notes (Signed)
SUBJECTIVE: He is confused this am. It appears that he had confusion during his recent admission as well.    Filed Vitals:   12/27/15 0525 12/27/15 1400 12/27/15 1927 12/28/15 0454  BP: 99/68 100/43 103/54 100/50  Pulse: 69 69 71 71  Temp: 98 F (36.7 C) 98.3 F (36.8 C) 97.9 F (36.6 C) 98.1 F (36.7 C)  TempSrc: Oral Oral Oral Oral  Resp: 18 18 18 18   Weight:    149 lb 7.6 oz (67.8 kg)  SpO2: 99% 100% 99% 100%    Intake/Output Summary (Last 24 hours) at 12/28/15 0739 Last data filed at 12/27/15 1800  Gross per 24 hour  Intake    240 ml  Output      0 ml  Net    240 ml    LABS: Basic Metabolic Panel:  Recent Labs  12/26/15 2102  NA 138  K 3.2*  CL 96*  CO2 28  GLUCOSE 112*  BUN 12  CREATININE 2.71*  CALCIUM 8.4*   Liver Function Tests: No results for input(s): AST, ALT, ALKPHOS, BILITOT, PROT, ALBUMIN in the last 72 hours. No results for input(s): LIPASE, AMYLASE in the last 72 hours. CBC:  Recent Labs  12/26/15 2102 12/28/15 0505  WBC 8.9 8.6  NEUTROABS 6.3  --   HGB 10.8* 10.4*  HCT 34.3* 32.1*  MCV 82.3 84.5  PLT 196 247   Cardiac Enzymes: No results for input(s): CKTOTAL, CKMB, CKMBINDEX, TROPONINI in the last 72 hours. BNP: Invalid input(s): POCBNP D-Dimer: No results for input(s): DDIMER in the last 72 hours. Hemoglobin A1C: No results for input(s): HGBA1C in the last 72 hours. Fasting Lipid Panel: No results for input(s): CHOL, HDL, LDLCALC, TRIG, CHOLHDL, LDLDIRECT in the last 72 hours. Thyroid Function Tests: No results for input(s): TSH, T4TOTAL, T3FREE, THYROIDAB in the last 72 hours.  Invalid input(s): FREET3 Anemia Panel: No results for input(s): VITAMINB12, FOLATE, FERRITIN, TIBC, IRON, RETICCTPCT in the last 72 hours.   PHYSICAL EXAM General: Well developed, well nourished, in no acute distress HEENT:  Normocephalic and atramatic Neck:  No JVD.  Lungs: Clear bilaterally to auscultation and percussion. Heart: HRRR .  Normal S1 and S2 without gallops or murmurs.  Abdomen: Bowel sounds are positive, abdomen soft and non-tender  Msk:  Back normal, normal gait. Normal strength and tone for age. Extremities: No clubbing, cyanosis or edema.   Neuro: Alert and oriented X 3. Psych:  Good affect, responds appropriately Vascular: femoral pulses are normal. Distal pulses are not palpable. Left foot is warm to touch and wrapped.    ASSESSMENT AND PLAN:  Critical lower limb ischemia - pulses are Dopplerable, but recently placed peroneal stent is occluded which is the only run off to the foot. Both posterior and anterior tibial arteries are known to be occluded.  - I gave vitamin K 5 mg X1 yesterday. Check INR today.  - PV angio on Monday by Dr. Gwenlyn Found if INR low enough and mental status is improved.  -  Continue Plavix.  - Continue wound care.    Chronic systolic heart failure (HCC) - follow I/O and weights - continue home rx, no sx volume overload now   End stage renal disease ([place]) - HD on M-W-F -  I consulted Dr. Justin Mend for routine dialysis .    PAF (paroxysmal atrial fibrillation) ([place]) - follow on telemetry Continue Heparin for now.    Acute delerium - he had similar symptoms during last admission. EEG showed  nonspecific diffuse cerebral dysfunction. He seems to be sundowning. If no improvement, consider neurology consult.   Kathlyn Sacramento, MD, [facility] 12/28/2015 7:39 AM

## 2015-12-28 NOTE — Progress Notes (Signed)
[facility] ROUNDING NOTE   Subjective:   Interval History  No complaints today appears comfortable no appetite  Objective:  Vital signs in last 24 hours:  Temp:  [97.9 F (36.6 C)-98.3 F (36.8 C)] 98.1 F (36.7 C) (03/05 0454) Pulse Rate:  [69-71] 71 (03/05 0454) Resp:  [18] 18 (03/05 0454) BP: (100-103)/(43-54) 100/50 mmHg (03/05 0454) SpO2:  [99 %-100 %] 100 % (03/05 0454) Weight:  [67.8 kg (149 lb 7.6 oz)] 67.8 kg (149 lb 7.6 oz) (03/05 0454)  Weight change: -1 kg (-2 lb 3.3 oz) Filed Weights   12/26/15 1948 12/28/15 0454  Weight: 68.8 kg (151 lb 10.8 oz) 67.8 kg (149 lb 7.6 oz)    Intake/Output: I/O last 3 completed shifts: In: 240 [P.O.:240] Out: -    Intake/Output this shift:       General: Well developed, well nourished, in no acute distress. Head: Normocephalic, atraumatic, sclera non-icteric, mucus membranes are moist Neck: Supple. JVD not elevated. Lungs: Clear bilaterally to auscultation without wheezes, rales, or rhonchi. Breathing is unlabored. Heart: RRR with S1 S2. No murmurs, rubs, or gallops appreciated. Abdomen: Soft, non-tender, non-distended with normoactive bowel sounds. No rebound/guarding. No obvious abdominal masses. M-S: Strength and tone appear normal for age. Lower extremities:without edema or ischemic changes, no open wounds  Neuro: Alert and oriented X 3. Moves all extremities spontaneously. Psych: Responds to questions appropriately with a normal affect. Dialysis Access: LUA AVF + bruit   Basic Metabolic Panel:  Recent Labs Lab 12/26/15 2102  NA 138  K 3.2*  CL 96*  CO2 28  GLUCOSE 112*  BUN 12  CREATININE 2.71*  CALCIUM 8.4*    Liver Function Tests: No results for input(s): AST, ALT, ALKPHOS, BILITOT, PROT, ALBUMIN in the last 168 hours. No results for input(s): LIPASE, AMYLASE in the last 168 hours. No results for input(s): AMMONIA in the last 168 hours.  CBC:  Recent Labs Lab 12/26/15 2102  12/28/15 0505  WBC 8.9 8.6  NEUTROABS 6.3  --   HGB 10.8* 10.4*  HCT 34.3* 32.1*  MCV 82.3 84.5  PLT 196 247    Cardiac Enzymes: No results for input(s): CKTOTAL, CKMB, CKMBINDEX, TROPONINI in the last 168 hours.  BNP: Invalid input(s): POCBNP  CBG: No results for input(s): GLUCAP in the last 168 hours.  Microbiology: Results for orders placed or performed during the hospital encounter of 08/07/15  Surgical pcr screen     Status: None   Collection Time: 08/07/15  8:30 AM  Result Value Ref Range Status   MRSA, PCR NEGATIVE NEGATIVE Final   Staphylococcus aureus NEGATIVE NEGATIVE Final    Comment:        The Xpert SA Assay (FDA approved for NASAL specimens in patients over 74 years of age), is one component of a comprehensive surveillance program.  Test performance has been validated by [facility] for patients greater than or equal to 21 year old. It is not intended to diagnose infection nor to guide or monitor treatment.     Coagulation Studies:  Recent Labs  12/26/15 2102 12/27/15 0416 12/27/15 1557 12/28/15 0820  LABPROT 24.4* 24.4* 23.1* 21.9*  INR 2.22* 2.22* 2.07* 1.92*    Urinalysis: No results for input(s): COLORURINE, LABSPEC, PHURINE, GLUCOSEU, HGBUR, BILIRUBINUR, KETONESUR, PROTEINUR, UROBILINOGEN, NITRITE, LEUKOCYTESUR in the last 72 hours.  Invalid input(s): APPERANCEUR    Imaging: No results found.   Medications:   . heparin 1,000 Units/hr (12/27/15 2014)   . allopurinol  100  mg Oral Daily  . amiodarone  200 mg Oral Daily  . atorvastatin  40 mg Oral q1800  . carvedilol  3.125 mg Oral BID WC  . clopidogrel  75 mg Oral Q breakfast  . collagenase  1 application Topical Daily  . [START ON 12/29/2015] doxercalciferol  5 mcg Intravenous Q M,W,F-HD  . DULoxetine  30 mg Oral Daily  . feeding supplement (NEPRO CARB STEADY)  237 mL Oral BID BM  . [START ON 12/29/2015] midodrine  10 mg Oral Q M,W,F  . multivitamin with minerals  1 tablet Oral  Daily  . silver sulfADIAZINE  1 application Topical Daily  . sodium chloride flush  3 mL Intravenous Q12H   sodium chloride, acetaminophen **OR** acetaminophen, camphor-menthol **AND** hydrOXYzine, docusate sodium, feeding supplement (NEPRO CARB STEADY), nitroGLYCERIN, ondansetron **OR** ondansetron (ZOFRAN) IV, sodium chloride flush, sorbitol, zolpidem  Assessment/ Plan:  Dialysis Orders: [facility]. MonWedFri, 4 hrs 0 min, 180NRe Optiflux, BFR 400, DFR Manual 800 mL/min, EDW 66.5 (kg), Dialysate 2.0 K, 2.25 Ca, UFR Profile: Profile 4, Sodium Model: Linear, Access: LUA AV Fistula Heparin: NO HEPARIN Mircera: 200 mcg IV Q 2 weeks ( last dose 12/24/15 HGB 10.6) Venofer: 50 mg IV weekly (last dose 12/24/15) Hectoral: 5 mcg IV q treatment 1. Critical Limb ischemia: Per cardiology. For [place] cath Monday. Will have HD following cath.  2. PAF: Per cards-coumadin on hold-to be started on heparin drip when INR < 2.0 3. ESRD - MWF Will had HD Monday on schedule. K+3.2. Will use 4.0K bath, recheck labs prior to HD.  4. Hypertension/volume - BP slightly soft, controlled. On midodrine 10 mg PO prior to HD MWF. Will attempt 2-3 liters Monday as BP tolerates.  5. Anemia - HGB 10.8 rec'd ESA dose 12/24/15. Follow HGB 6. Metabolic bone disease - Ca 8.4 No binders, continue hectoral  7. Nutrition - Renal diet, renal vitamin check albumin.  8. Nonhealing wound L heel/Pressure ulcer R Hip: Per primary, wound care following.   LOS: 2 Zyden Suman W @TODAY @12 :53 PM

## 2015-12-29 ENCOUNTER — Ambulatory Visit (HOSPITAL_COMMUNITY): Admission: RE | Admit: 2015-12-29 | Payer: Medicare Other | Source: Ambulatory Visit | Admitting: Cardiovascular Disease

## 2015-12-29 ENCOUNTER — Inpatient Hospital Stay (HOSPITAL_COMMUNITY): Payer: Medicare Other

## 2015-12-29 ENCOUNTER — Encounter (HOSPITAL_COMMUNITY)
Admission: AD | Disposition: A | Discharge status: Skilled Nursing Facility | Payer: Self-pay | Source: Ambulatory Visit | Attending: Cardiovascular Disease

## 2015-12-29 DIAGNOSIS — G9340 Encephalopathy, unspecified: Secondary | ICD-10-CM

## 2015-12-29 LAB — CBC
HEMATOCRIT: 33.2 % — AB (ref 39.0–52.0)
HEMOGLOBIN: 10.4 g/dL — AB (ref 13.0–17.0)
MCH: 25.9 pg — ABNORMAL LOW (ref 26.0–34.0)
MCHC: 31.3 g/dL (ref 30.0–36.0)
MCV: 82.8 fL (ref 78.0–100.0)
Platelets: 197 10*3/uL (ref 150–400)
RBC: 4.01 MIL/uL — AB (ref 4.22–5.81)
RDW: 20.4 % — ABNORMAL HIGH (ref 11.5–15.5)
WBC: 8.7 10*3/uL (ref 4.0–10.5)

## 2015-12-29 LAB — BASIC METABOLIC PANEL
Anion gap: 13 (ref 5–15)
BUN: 32 mg/dL — ABNORMAL HIGH (ref 6–20)
CALCIUM: 8.6 mg/dL — AB (ref 8.9–10.3)
CHLORIDE: 99 mmol/L — AB (ref 101–111)
CO2: 28 mmol/L (ref 22–32)
CREATININE: 5.85 mg/dL — AB (ref 0.61–1.24)
GFR, EST AFRICAN AMERICAN: 9 mL/min — AB (ref >60)
GFR, EST NON AFRICAN AMERICAN: 8 mL/min — AB (ref >60)
Glucose, Bld: 82 mg/dL (ref 65–99)
POTASSIUM: 3.5 mmol/L (ref 3.5–5.1)
SODIUM: 140 mmol/L (ref 135–145)

## 2015-12-29 LAB — FOLATE: FOLATE: 20 ng/mL (ref >5.9)

## 2015-12-29 LAB — PROTIME-INR
INR: 1.79 — AB (ref 0.00–1.49)
Prothrombin Time: 20.8 seconds — ABNORMAL HIGH (ref 11.6–15.2)

## 2015-12-29 LAB — HEPARIN LEVEL (UNFRACTIONATED)
HEPARIN UNFRACTIONATED: 0.13 [IU]/mL — AB (ref 0.30–0.70)
Heparin Unfractionated: 0.31 IU/mL (ref 0.30–0.70)

## 2015-12-29 LAB — AMMONIA: Ammonia: 34 umol/L (ref 9–35)

## 2015-12-29 LAB — VITAMIN B12: Vitamin B-12: 1775 pg/mL — ABNORMAL HIGH (ref 180–914)

## 2015-12-29 SURGERY — LOWER EXTREMITY ANGIOGRAPHY
Anesthesia: LOCAL

## 2015-12-29 MED ORDER — DOXERCALCIFEROL 4 MCG/2ML IV SOLN
INTRAVENOUS | Status: AC
  Administered 2015-12-29: 5 ug via INTRAVENOUS
  Filled 2015-12-29: qty 4

## 2015-12-29 NOTE — Progress Notes (Signed)
ANTICOAGULATION CONSULT NOTE - Follow Up Consult  Pharmacy Consult for Heparin Indication: atrial fibrillation  No Known Allergies  Patient Measurements: Weight: 149 lb 7.6 oz (67.8 kg)  Vital Signs: Temp: 97.8 F (36.6 C) (03/06 0544) Temp Source: Axillary (03/06 0544) BP: 95/43 mmHg (03/06 0544) Pulse Rate: 70 (03/06 0544)  Labs:  Recent Labs  12/26/15 2102  12/27/15 1557 12/28/15 0505 12/28/15 0820 12/28/15 1359 12/29/15 0320 12/29/15 0509  HGB 10.8*  --   --  10.4*  --   --  10.4*  --   HCT 34.3*  --   --  32.1*  --   --  33.2*  --   PLT 196  --   --  247  --   --  197  --   LABPROT 24.4*  < > 23.1*  --  21.9*  --   --  20.8*  INR 2.22*  < > 2.07*  --  1.92*  --   --  1.79*  HEPARINUNFRC  --   --   --  <0.10*  --  0.32 0.13*  --   CREATININE 2.71*  --   --   --   --   --   --  5.85*  < > = values in this interval not displayed.  Estimated Creatinine Clearance: 9.2 mL/min (by C-G formula based on Cr of 5.85).   Medications:  Heparin @ 1250 units/hr  Assessment: 85yom on coumadin pta for afib, admitted with suspected peroneal artery stent occlusion. Coumadin held and he was started on IV heparin 3/4 pending PV angio study on Monday. Heparin level is now subtherapeutic at 0.13. CBC stable. No bleeding/IV line issues reported per RN.  Goal of Therapy:  Heparin level 0.3-0.7 units/ml Monitor platelets by anticoagulation protocol: Yes   Plan:  Increase heparin to 1450 units/h 8h HL, Daily HL/CBC Monitor s/sx bleeding PV angio study 3/6  Elicia Lamp, PharmD, [facility] Clinical Pharmacist Pager [phone] 12/29/2015 8:51 AM

## 2015-12-29 NOTE — Consult Note (Signed)
WOC consult requested for foot and hip wounds.  Pt is familiar to [place] from recent admission; refer to consult note on 2/20.  Pt just left currently for dialysis and I will not be able to assess the wounds until tomorrow.  He hasSantyl already ordered for the left heel wounds and has been followed by podiatry prior to admission for assessment and plan of care. Float heel to reduce pressure He has been using Silvadene to the left hip for chemical debridement.  Continue present plan of care and WOC will perform consult tomorrow as requested. Julien Girt MSN, RN, [place], [place], [place]

## 2015-12-29 NOTE — Progress Notes (Deleted)
[facility] ROUNDING NOTE   Subjective:   Interval History: tired, a bit confused but better than last week per wife  Objective:  Vital signs in last 24 hours:  Temp:  [97.8 F (36.6 C)-98.2 F (36.8 C)] 97.8 F (36.6 C) (03/06 0544) Pulse Rate:  [70-73] 70 (03/06 0544) Resp:  [14-17] 14 (03/06 0544) BP: (93-100)/(43-46) 95/43 mmHg (03/06 0544) SpO2:  [93 %-100 %] 100 % (03/06 0544)  Weight change:  Filed Weights   12/26/15 1948 12/28/15 0454  Weight: 68.8 kg (151 lb 10.8 oz) 67.8 kg (149 lb 7.6 oz)   Exam:  General: drowsy, arouses, a bit confused Neck: no jvd Lungs: Clear bilat Heart: RRR with S1 S2. No murmurs, rubs, or gallops appreciated. Abdomen: Soft, ntnd no mass / ascites Ext: L heel ulcer wrapped, no LE edema Neuro: Ox 3, nonfocal Dialysis Access: LUA AVF + bruit   Assessment/ Plan:  Dialysis Orders: [facility] MWF   4h  66.5kg  2/2.25 bath  LUA AVF  Hep none Mircera: 200 mcg IV Q 2 weeks ( last dose 12/24/15 HGB 10.6) Venofer: 50 mg IV weekly (last dose 12/24/15) Hectoral: 5 mcg IV q treatment  Assessment: 1. Ischemic L foot/ heel ulcer - Per cardiology. Recently placed stent now occluded 2. PAF: Per cards-coumadin on hold-to be started on heparin drip when INR < 2.0 3. ESRD - MWF HD 4. HypOtension chronic - on midodrine.  BP slightly soft, controlled. On midodrine 10 mg PO prior to HD MWF. Will attempt 2-3 liters as BP tolerates.  5. Anemia - HGB 10.8 rec'd ESA dose 12/24/15. Follow HGB 6. Metabolic bone disease - Ca 8.4 No binders, continue hectoral  7. Nutrition - Renal diet, renal vitamin check albumin.    Plan - HD today  Kelly Splinter MD [facility] pager [phone]    cell [phone] 12/29/2015, 9:12 AM    Basic Metabolic Panel:  Recent Labs Lab 12/26/15 2102 12/29/15 0509  NA 138 140  K 3.2* 3.5  CL 96* 99*  CO2 28 28  GLUCOSE 112* 82  BUN 12 32*  CREATININE 2.71* 5.85*  CALCIUM 8.4* 8.6*    Liver  Function Tests: No results for input(s): AST, ALT, ALKPHOS, BILITOT, PROT, ALBUMIN in the last 168 hours. No results for input(s): LIPASE, AMYLASE in the last 168 hours. No results for input(s): AMMONIA in the last 168 hours.  CBC:  Recent Labs Lab 12/26/15 2102 12/28/15 0505 12/29/15 0320  WBC 8.9 8.6 8.7  NEUTROABS 6.3  --   --   HGB 10.8* 10.4* 10.4*  HCT 34.3* 32.1* 33.2*  MCV 82.3 84.5 82.8  PLT 196 247 197    Cardiac Enzymes: No results for input(s): CKTOTAL, CKMB, CKMBINDEX, TROPONINI in the last 168 hours.  BNP: Invalid input(s): POCBNP  CBG: No results for input(s): GLUCAP in the last 168 hours.  Microbiology: Results for orders placed or performed during the hospital encounter of 08/07/15  Surgical pcr screen     Status: None   Collection Time: 08/07/15  8:30 AM  Result Value Ref Range Status   MRSA, PCR NEGATIVE NEGATIVE Final   Staphylococcus aureus NEGATIVE NEGATIVE Final    Comment:        The Xpert SA Assay (FDA approved for NASAL specimens in patients over 78 years of age), is one component of a comprehensive surveillance program.  Test performance has been validated by [facility] for patients greater than or equal to 56 year old.  It is not intended to diagnose infection nor to guide or monitor treatment.     Coagulation Studies:  Recent Labs  12/26/15 2102 12/27/15 0416 12/27/15 1557 12/28/15 0820 12/29/15 0509  LABPROT 24.4* 24.4* 23.1* 21.9* 20.8*  INR 2.22* 2.22* 2.07* 1.92* 1.79*    Urinalysis: No results for input(s): COLORURINE, LABSPEC, PHURINE, GLUCOSEU, HGBUR, BILIRUBINUR, KETONESUR, PROTEINUR, UROBILINOGEN, NITRITE, LEUKOCYTESUR in the last 72 hours.  Invalid input(s): APPERANCEUR    Imaging: No results found.   Medications:   . heparin 1,250 Units/hr (12/28/15 1827)   . allopurinol  100 mg Oral Daily  . amiodarone  200 mg Oral Daily  . atorvastatin  40 mg Oral q1800  . carvedilol  3.125 mg Oral BID WC  .  clopidogrel  75 mg Oral Q breakfast  . collagenase  1 application Topical Daily  . doxercalciferol  5 mcg Intravenous Q M,W,F-HD  . DULoxetine  30 mg Oral Daily  . feeding supplement (NEPRO CARB STEADY)  237 mL Oral BID BM  . midodrine  10 mg Oral Q M,W,F  . multivitamin with minerals  1 tablet Oral Daily  . silver sulfADIAZINE  1 application Topical Daily  . sodium chloride flush  3 mL Intravenous Q12H   sodium chloride, acetaminophen **OR** acetaminophen, camphor-menthol **AND** hydrOXYzine, docusate sodium, feeding supplement (NEPRO CARB STEADY), nitroGLYCERIN, ondansetron **OR** ondansetron (ZOFRAN) IV, sodium chloride flush, sorbitol, zolpidem

## 2015-12-29 NOTE — Progress Notes (Signed)
[facility] ROUNDING NOTE   Subjective:   Interval History: tired, a bit confused but better than last week per wife  Objective:  Vital signs in last 24 hours:  Temp:  [97.8 F (36.6 C)-98.2 F (36.8 C)] 97.8 F (36.6 C) (03/06 0544) Pulse Rate:  [70-73] 70 (03/06 0544) Resp:  [14-17] 14 (03/06 0544) BP: (93-100)/(43-46) 95/43 mmHg (03/06 0544) SpO2:  [93 %-100 %] 100 % (03/06 0544)  Weight change:  Filed Weights   12/26/15 1948 12/28/15 0454  Weight: 68.8 kg (151 lb 10.8 oz) 67.8 kg (149 lb 7.6 oz)   Exam:  General: drowsy, arouses, a bit confused Neck: no jvd Lungs: Clear bilat Heart: RRR with S1 S2. No murmurs, rubs, or gallops appreciated. Abdomen: Soft, ntnd no mass / ascites Ext: L heel ulcer wrapped, no LE edema Neuro: Ox 3, nonfocal Dialysis Access: LUA AVF + bruit   Assessment/ Plan:  Dialysis Orders: [facility] MWF   4h  66.5kg  2/2.25 bath  LUA AVF  Hep none Mircera: 200 mcg IV Q 2 weeks ( last dose 12/24/15 HGB 10.6) Venofer: 50 mg IV weekly (last dose 12/24/15) Hectoral: 5 mcg IV q treatment  Assessment: 1. Ischemic L foot/ heel ulcer - Per cardiology. Recently placed stent now occluded 2. PAF: Per cards-coumadin on hold-to be started on heparin drip when INR < 2.0 3. ESRD - MWF HD 4. HypOtension chronic - on midodrine.  BP slightly soft, controlled. On midodrine 10 mg PO prior to HD MWF. Will attempt 2-3 liters as BP tolerates.  5. Anemia - HGB 10.8 rec'd ESA dose 12/24/15. Follow HGB 6. Metabolic bone disease - Ca 8.4 No binders, continue hectoral  7. Nutrition - Renal diet, renal vitamin check albumin.    Plan - HD today  Kelly Splinter MD [facility] pager [phone]    cell [phone] 12/29/2015, 9:23 AM    Basic Metabolic Panel:  Recent Labs Lab 12/26/15 2102 12/29/15 0509  NA 138 140  K 3.2* 3.5  CL 96* 99*  CO2 28 28  GLUCOSE 112* 82  BUN 12 32*  CREATININE 2.71* 5.85*  CALCIUM 8.4* 8.6*    Liver  Function Tests: No results for input(s): AST, ALT, ALKPHOS, BILITOT, PROT, ALBUMIN in the last 168 hours. No results for input(s): LIPASE, AMYLASE in the last 168 hours. No results for input(s): AMMONIA in the last 168 hours.  CBC:  Recent Labs Lab 12/26/15 2102 12/28/15 0505 12/29/15 0320  WBC 8.9 8.6 8.7  NEUTROABS 6.3  --   --   HGB 10.8* 10.4* 10.4*  HCT 34.3* 32.1* 33.2*  MCV 82.3 84.5 82.8  PLT 196 247 197    Cardiac Enzymes: No results for input(s): CKTOTAL, CKMB, CKMBINDEX, TROPONINI in the last 168 hours.  BNP: Invalid input(s): POCBNP  CBG: No results for input(s): GLUCAP in the last 168 hours.  Microbiology: Results for orders placed or performed during the hospital encounter of 08/07/15  Surgical pcr screen     Status: None   Collection Time: 08/07/15  8:30 AM  Result Value Ref Range Status   MRSA, PCR NEGATIVE NEGATIVE Final   Staphylococcus aureus NEGATIVE NEGATIVE Final    Comment:        The Xpert SA Assay (FDA approved for NASAL specimens in patients over 55 years of age), is one component of a comprehensive surveillance program.  Test performance has been validated by [facility] for patients greater than or equal to 87 year old.  It is not intended to diagnose infection nor to guide or monitor treatment.     Coagulation Studies:  Recent Labs  12/26/15 2102 12/27/15 0416 12/27/15 1557 12/28/15 0820 12/29/15 0509  LABPROT 24.4* 24.4* 23.1* 21.9* 20.8*  INR 2.22* 2.22* 2.07* 1.92* 1.79*    Urinalysis: No results for input(s): COLORURINE, LABSPEC, PHURINE, GLUCOSEU, HGBUR, BILIRUBINUR, KETONESUR, PROTEINUR, UROBILINOGEN, NITRITE, LEUKOCYTESUR in the last 72 hours.  Invalid input(s): APPERANCEUR    Imaging: No results found.   Medications:   . heparin 1,250 Units/hr (12/28/15 1827)   . allopurinol  100 mg Oral Daily  . amiodarone  200 mg Oral Daily  . atorvastatin  40 mg Oral q1800  . carvedilol  3.125 mg Oral BID WC  .  clopidogrel  75 mg Oral Q breakfast  . collagenase  1 application Topical Daily  . doxercalciferol  5 mcg Intravenous Q M,W,F-HD  . DULoxetine  30 mg Oral Daily  . feeding supplement (NEPRO CARB STEADY)  237 mL Oral BID BM  . midodrine  10 mg Oral Q M,W,F  . multivitamin with minerals  1 tablet Oral Daily  . silver sulfADIAZINE  1 application Topical Daily  . sodium chloride flush  3 mL Intravenous Q12H   sodium chloride, acetaminophen **OR** acetaminophen, camphor-menthol **AND** hydrOXYzine, docusate sodium, feeding supplement (NEPRO CARB STEADY), nitroGLYCERIN, ondansetron **OR** ondansetron (ZOFRAN) IV, sodium chloride flush, sorbitol, zolpidem

## 2015-12-29 NOTE — Consult Note (Addendum)
NEURO HOSPITALIST CONSULT NOTE   Requestig physician: Dr. Stanford Breed   Reason for Consult: confusion in the setting of hospitalization in patient with underlying cognitive decline.   HPI:                                                                                                                                          [name] is an 82 y.o. male who was noted to be confused after procedure on 2/20. On 2/23 he was noted by wife to have confusion about his location and visually hallucinating. He had a EEG which showed no seizure activity. Over the course of one week he has improved and per wife is much better. Per wife he no longer drives, she has taken over the billing due to multiple unpaid bills and mistakes. She does all the cooking. Wife states she knows he likely has a form of dementia. He has also been noted to shuffle his feet and fall backwards. Neurology called to evaluate.   Past Medical History  Diagnosis Date  . Dyslipidemia   . Chronic systolic CHF (congestive heart failure) ([place])     a. s/p ICD with upgrade to Medtronic BiV-ICD 07/2015 when it had reached ERI.  . Paroxysmal atrial fibrillation ([place])   . Hypothyroidism   . Coronary artery disease     a. ant-lat MI s/p PTCA/DES to LAD 2006.  Marland Kitchen Hypertension   . Ischemic cardiomyopathy     a. s/p MDT CRTD  . AICD (automatic cardioverter/defibrillator) present   . Myocardial infarction ([place])   . Depression     hx  . History of blood transfusion 03/2015    LGIB  . GI bleed     a. 03/2015 in setting of elevated INR.  Marland Kitchen Arthritis     "everywhere"  . History of gout   . ESRD (end stage renal disease) on dialysis [facility])     "[place]; MWF" (07/10/2015)  . Prostate cancer ([place])     S/P seed implants  . Critical lower limb ischemia   . LBBB (left bundle branch block)   . Atrial tachycardia ([place])     a. WCT 06/2015 felt to be atrial tach.  . Anemia of chronic disease   . PAD (peripheral  artery disease) [facility])     Past Surgical History  Procedure Laterality Date  . Medtronic biv icd  10/2006    Dr Lovena Le  . Total knee arthroplasty Right   . Av fistula placement  10/13/2012    Procedure: ARTERIOVENOUS (AV) FISTULA CREATION;  Surgeon: Mal Misty, MD;  Location: [place];  Service: Vascular;  Laterality: Right;  Creation right arm arteriovenous fistula   . Insertion of dialysis catheter  10/13/2012    Procedure: INSERTION OF DIALYSIS  CATHETER;  Surgeon: Mal Misty, MD;  Location: [place];  Service: Vascular;  Laterality: N/A;  right internal jugular vein  . Ligation of competing branches of arteriovenous fistula  11/24/2012    Procedure: LIGATION OF COMPETING BRANCHES OF ARTERIOVENOUS FISTULA;  Surgeon: Rosetta Posner, MD;  Location: [facility] OR;  Service: Vascular;  Laterality: Right;  ligation of competing branch right arteriovenous fistula  . Av fistula placement Left 01/08/2013    Procedure: ARTERIOVENOUS (AV) FISTULA CREATION;  Surgeon: Elam Dutch, MD;  Location: [place];  Service: Vascular;  Laterality: Left;  . Cardioversion N/A 07/21/2012    Procedure: CARDIOVERSION;  Surgeon: Evans Lance, MD;  Location: [facility] CATH LAB;  Service: Cardiovascular;  Laterality: N/A;  . Esophagogastroduodenoscopy Left 04/09/2015    Procedure: ESOPHAGOGASTRODUODENOSCOPY (EGD);  Surgeon: Laurence Spates, MD;  Location: [facility] ENDOSCOPY;  Service: Endoscopy;  Laterality: Left;  . Colonoscopy N/A 04/10/2015    Procedure: COLONOSCOPY;  Surgeon: Laurence Spates, MD;  Location: [place];  Service: Endoscopy;  Laterality: N/A;  . Joint replacement    . Biv icd genertaor change out  X 1    "replaced the battery"  . Coronary angioplasty with stent placement  02/2005    PTCA and DES to LAD Dr Terrence Dupont  . Insertion prostate radiation seed    . Ep implantable device N/A 08/07/2015    Procedure: ICD/BIV ICD Generator Changeout;  Surgeon: Evans Lance, MD;  Location: [place] CV LAB;  Service: Cardiovascular;   Laterality: N/A;  . Peripheral vascular catheterization N/A 12/17/2015    Procedure: Lower Extremity Intervention;  Surgeon: Wellington Hampshire, MD;  Location: [place] CV LAB;  Service: Cardiovascular;  Laterality: N/A;  Bilateral leg runoff performed  . Peripheral vascular catheterization N/A 12/17/2015    Procedure: Abdominal Aortogram;  Surgeon: Wellington Hampshire, MD;  Location: [place] CV LAB;  Service: Cardiovascular;  Laterality: N/A;  . Peripheral vascular catheterization Left 12/17/2015    Procedure: Peripheral Vascular Intervention;  Surgeon: Wellington Hampshire, MD;  Location: [place] CV LAB;  Service: Cardiovascular;  Laterality: Left;  peroneal artery    Family History  Problem Relation Age of Onset  . Diabetes Mother      Social History:  reports that he has quit smoking. His smoking use included Cigarettes and Cigars. He quit after 10 years of use. He has never used smokeless tobacco. He reports that he drinks alcohol. He reports that he does not use illicit drugs.  No Known Allergies  MEDICATIONS:                                                                                                                     Prior to Admission:  Prescriptions prior to admission  Medication Sig Dispense Refill Last Dose  . allopurinol (ZYLOPRIM) 100 MG tablet Take 100 mg by mouth daily.    12/26/2015 at 800  . amiodarone (PACERONE) 200 MG tablet Take 200 mg by mouth daily.    12/26/2015  at 800  . atorvastatin (LIPITOR) 40 MG tablet Take 40 mg by mouth daily at 6 PM.    12/25/2015 at Unknown time  . carvedilol (COREG) 3.125 MG tablet Take 1 tablet (3.125 mg total) by mouth 2 (two) times daily with a meal. 60 tablet 1 maybe 3/3 at 800  . clopidogrel (PLAVIX) 75 MG tablet Take 1 tablet (75 mg total) by mouth daily with breakfast. 90 tablet 3 12/26/2015 at 800  . collagenase (SANTYL) ointment Apply 1 application topically daily. 15 g 0 12/25/2015 at Unknown time  . DULoxetine (CYMBALTA) 30 MG  capsule Take 30 mg by mouth daily.   12/26/2015 at 800  . ethyl chloride spray Apply 1 application topically as needed. (prior to dialysis)  12 12/26/2015 at Unknown time  . midodrine (PROAMATINE) 10 MG tablet Take 10 mg by mouth every Monday, Wednesday, and Friday.   5 12/26/2015 at 800  . Multiple Vitamin (MULTIVITAMIN WITH MINERALS) TABS Take 1 tablet by mouth daily. 50+   12/26/2015 at 800  . Multiple Vitamins-Minerals (DECUBI-VITE) CAPS Take 1 capsule by mouth daily.   12/26/2015 at 800  . Nutritional Supplements (FEEDING SUPPLEMENT, NEPRO CARB STEADY,) LIQD Take 237 mLs by mouth 2 (two) times daily between meals. 12pm, 5pm   12/25/2015 at Unknown time  . silver sulfADIAZINE (SILVADENE) 1 % cream Apply 1 application topically daily. 50 g 3 12/25/2015 at Unknown time  . warfarin (COUMADIN) 3 MG tablet Take 1.5-3 mg by mouth daily at 6 PM. Take 1/2 tablet (1.5 mg) by mouth on Sunday evening, take 1 tablet (3 mg) on all other days of the week  3 12/25/2015 at Unknown time  . diphenoxylate-atropine (LOMOTIL) 2.5-0.025 MG tablet 1 tablet once per day if greater than one episode of diarrhea. (Patient not taking: Reported on 12/26/2015) 10 tablet 0 Not Taking at Unknown time   Scheduled: . allopurinol  100 mg Oral Daily  . amiodarone  200 mg Oral Daily  . atorvastatin  40 mg Oral q1800  . carvedilol  3.125 mg Oral BID WC  . clopidogrel  75 mg Oral Q breakfast  . collagenase  1 application Topical Daily  . doxercalciferol  5 mcg Intravenous Q M,W,F-HD  . DULoxetine  30 mg Oral Daily  . feeding supplement (NEPRO CARB STEADY)  237 mL Oral BID BM  . midodrine  10 mg Oral Q M,W,F  . multivitamin with minerals  1 tablet Oral Daily  . silver sulfADIAZINE  1 application Topical Daily  . sodium chloride flush  3 mL Intravenous Q12H     ROS:                                                                                                                                       History obtained from the patient  General  ROS: negative for - chills, fatigue, fever, night sweats, weight gain or weight  loss Psychological ROS: negative for - behavioral disorder, hallucinations, memory difficulties, mood swings or suicidal ideation Ophthalmic ROS: negative for - blurry vision, double vision, eye pain or loss of vision ENT ROS: negative for - epistaxis, nasal discharge, oral lesions, sore throat, tinnitus or vertigo Allergy and Immunology ROS: negative for - hives or itchy/watery eyes Hematological and Lymphatic ROS: negative for - bleeding problems, bruising or swollen lymph nodes Endocrine ROS: negative for - galactorrhea, hair pattern changes, polydipsia/polyuria or temperature intolerance Respiratory ROS: negative for - cough, hemoptysis, shortness of breath or wheezing Cardiovascular ROS: negative for - chest pain, dyspnea on exertion, edema or irregular heartbeat Gastrointestinal ROS: negative for - abdominal pain, diarrhea, hematemesis, nausea/vomiting or stool incontinence Genito-Urinary ROS: negative for - dysuria, hematuria, incontinence or urinary frequency/urgency Musculoskeletal ROS: negative for - joint swelling or muscular weakness Neurological ROS: as noted in HPI Dermatological ROS: negative for rash and skin lesion changes   Blood pressure 95/43, pulse 70, temperature 97.8 F (36.6 C), temperature source Axillary, resp. rate 14, weight 67.8 kg (149 lb 7.6 oz), SpO2 100 %.   Neurologic Examination:                                                                                                      HEENT-  Normocephalic, no lesions, without obvious abnormality.  Normal external eye and conjunctiva.  Normal TM's bilaterally.  Normal auditory canals and external ears. Normal external nose, mucus membranes and septum.  Normal pharynx. Cardiovascular- S1, S2 normal, pulses palpable throughout   Lungs- chest clear, no wheezing, rales, normal symmetric air entry Abdomen- normal findings: bowel sounds  normal Extremities- no edema Lymph-no adenopathy palpable Musculoskeletal-no joint tenderness, deformity or swelling Skin-warm and dry, no hyperpigmentation, vitiligo, or suspicious lesions  Neurological Examination Mental Status: Alert, oriented to month, year, wife, -able to state there are 8 quarters in a dollar, recognize keys and watch and state what portion is a band.  Speech fluent without evidence of aphasia.  Able to follow 3 step commands without difficulty. Cranial Nerves: II:  Visual fields grossly normal, pupils equal, round, reactive to light and accommodation III,IV, VI: ptosis not present, extra-ocular motions intact bilaterally V,VII: smile symmetric, facial light touch sensation normal bilaterally VIII: hearing normal bilaterally IX,X: uvula rises symmetrically XI: bilateral shoulder shrug XII: midline tongue extension Motor: Right : Upper extremity   5/5    Left:     Upper extremity   5/5  Lower extremity   3/5     Lower extremity   3/5 Tone and bulk:normal tone throughout; no atrophy noted Sensory: Pinprick and light touch intact throughout UE and minimal from Knee dow bilateral legs.  Deep Tendon Reflexes: 1+ and symmetric throughout with no KJ or AJ Plantars: Mute bilaterally Cerebellar: normal finger-to-nose Gait: not tested      Lab Results: Basic Metabolic Panel:  Recent Labs Lab 12/26/15 2102 12/29/15 0509  NA 138 140  K 3.2* 3.5  CL 96* 99*  CO2 28 28  GLUCOSE 112* 82  BUN 12 32*  CREATININE 2.71* 5.85*  CALCIUM 8.4* 8.6*    Liver Function Tests: No results for input(s): AST, ALT, ALKPHOS, BILITOT, PROT, ALBUMIN in the last 168 hours. No results for input(s): LIPASE, AMYLASE in the last 168 hours. No results for input(s): AMMONIA in the last 168 hours.  CBC:  Recent Labs Lab 12/26/15 2102 12/28/15 0505 12/29/15 0320  WBC 8.9 8.6 8.7  NEUTROABS 6.3  --   --   HGB 10.8* 10.4* 10.4*  HCT 34.3* 32.1* 33.2*  MCV 82.3 84.5 82.8   PLT 196 247 197    Cardiac Enzymes: No results for input(s): CKTOTAL, CKMB, CKMBINDEX, TROPONINI in the last 168 hours.  Lipid Panel: No results for input(s): CHOL, TRIG, HDL, CHOLHDL, VLDL, LDLCALC in the last 168 hours.  CBG: No results for input(s): GLUCAP in the last 168 hours.  Microbiology: Results for orders placed or performed during the hospital encounter of 08/07/15  Surgical pcr screen     Status: None   Collection Time: 08/07/15  8:30 AM  Result Value Ref Range Status   MRSA, PCR NEGATIVE NEGATIVE Final   Staphylococcus aureus NEGATIVE NEGATIVE Final    Comment:        The Xpert SA Assay (FDA approved for NASAL specimens in patients over 31 years of age), is one component of a comprehensive surveillance program.  Test performance has been validated by [facility] for patients greater than or equal to 32 year old. It is not intended to diagnose infection nor to guide or monitor treatment.     Coagulation Studies:  Recent Labs  12/26/15 2102 12/27/15 0416 12/27/15 1557 12/28/15 0820 12/29/15 0509  LABPROT 24.4* 24.4* 23.1* 21.9* 20.8*  INR 2.22* 2.22* 2.07* 1.92* 1.79*    Imaging: No results found.   Etta Quill PA-C [facility] [phone]  12/29/2015, 10:30 AM   Assessment/Plan: 82 YO male with known underlying cognitive decline with acute onset of confusion while in hospital after procedure. Currently he is improving and almost back to baseline per wife. EEG obtained at time of confusion was negative.   Recommend 1) CT head--has pacemaker and cannot have MRI 2) B12, RPR, Ammonia 3) will need out patient follow up at time of discharge for further evaluation for dementia and/or parkinson's disease when stable.   I personally participated in this patient's evaluation and management, including formulating the above clinical assessment and management recommendations.  Rush Farmer M.D. [facility] [phone]

## 2015-12-29 NOTE — Progress Notes (Addendum)
   SUBJECTIVE: Patient answers questions appropriately. He is somewhat somnolent. He denies chest pain or dyspnea but does complain of left foot pain.   Filed Vitals:   12/28/15 0454 12/28/15 1614 12/28/15 2146 12/29/15 0544  BP: 100/50 100/46 93/46 95/43   Pulse: 71 71 73 70  Temp: 98.1 F (36.7 C) 98.1 F (36.7 C) 98.2 F (36.8 C) 97.8 F (36.6 C)  TempSrc: Oral Oral Oral Axillary  Resp: 18 17 16 14   Weight: 149 lb 7.6 oz (67.8 kg)     SpO2: 100% 93% 96% 100%    LABS: Basic Metabolic Panel:  Recent Labs  12/26/15 2102 12/29/15 0509  NA 138 140  K 3.2* 3.5  CL 96* 99*  CO2 28 28  GLUCOSE 112* 82  BUN 12 32*  CREATININE 2.71* 5.85*  CALCIUM 8.4* 8.6*   CBC:  Recent Labs  12/26/15 2102 12/28/15 0505 12/29/15 0320  WBC 8.9 8.6 8.7  NEUTROABS 6.3  --   --   HGB 10.8* 10.4* 10.4*  HCT 34.3* 32.1* 33.2*  MCV 82.3 84.5 82.8  PLT 196 247 197     PHYSICAL EXAM General: Well developed, frail, in no acute distress HEENT:  Normal Neck:  Supple Lungs: CTA Heart: RRR Abdomen: Soft, not tender or distended Extremities: No edema.   Neuro: Alert and oriented X 3. Vascular: femoral pulses are normal. Distal pulses are not palpable. Left foot wrapped   ASSESSMENT AND PLAN:  Critical lower limb ischemia - Recently placed peroneal stent is occluded which is the only run off to the foot. Both posterior and anterior tibial arteries are known to be occluded.  - Continue Plavix.  - Continue wound care.  - Patient is A and O x 3 this AM but somnolent; his wife states he has been intermittently confused since last procedure. Will review timing of procedure and approach to therapy with Dr Gwenlyn Found.   Chronic systolic heart failure/ICM (HCC) - follow I/O and weights - continue home rx, no sx volume overload now - continue coreg; no ACEI given borderline BP   End stage renal disease ([place]) - HD on M-W-F - Nephrology following for dialysis.    PAF (paroxysmal atrial  fibrillation) (HCC) - AV paced on telemetry; continue amiodarone; resume coumadin following all procedures. Continue Heparin for now.     CAD - continue plavix and statin   Acute delerium - some improvement this AM; he had similar symptoms during last admission and since DC per his wife. EEG showed nonspecific diffuse cerebral dysfunction. Possible sundowning. If continues, will obtain neurology consult.     Previous ICD  Kirk Ruths, MD, [facility] 12/29/2015 8:10 AM    Discussed with Dr. Gwenlyn Found. He requests a neurology consult. PV procedure has been canceled. He will review films and make a decision concerning attempt at repeat intervention versus need for amputation. Kirk Ruths

## 2015-12-29 NOTE — Progress Notes (Signed)
ANTICOAGULATION CONSULT NOTE - Follow Up Consult  Pharmacy Consult for Heparin Indication: atrial fibrillation  No Known Allergies  Patient Measurements: Weight: 134 lb 14.7 oz (61.2 kg)  Vital Signs: Temp: 97.7 F (36.5 C) (03/06 1747) Temp Source: Oral (03/06 1747) BP: 95/49 mmHg (03/06 1747) Pulse Rate: 69 (03/06 1747)  Labs:  Recent Labs  12/26/15 2102  12/27/15 1557  12/28/15 0505 12/28/15 0820 12/28/15 1359 12/29/15 0320 12/29/15 0509 12/29/15 1823  HGB 10.8*  --   --   --  10.4*  --   --  10.4*  --   --   HCT 34.3*  --   --   --  32.1*  --   --  33.2*  --   --   PLT 196  --   --   --  247  --   --  197  --   --   LABPROT 24.4*  < > 23.1*  --   --  21.9*  --   --  20.8*  --   INR 2.22*  < > 2.07*  --   --  1.92*  --   --  1.79*  --   HEPARINUNFRC  --   --   --   < > <0.10*  --  0.32 0.13*  --  0.31  CREATININE 2.71*  --   --   --   --   --   --   --  5.85*  --   < > = values in this interval not displayed.  Estimated Creatinine Clearance: 8.3 mL/min (by C-G formula based on Cr of 5.85).   Medications:  Heparin @ 1450 units/hr  Assessment: [name] on coumadin pta for afib, admitted with suspected peroneal artery stent occlusion. Coumadin held and he was started on IV heparin 3/4 pending PV angio study on Monday.   Heparin level is therapeutic at 0.31 on 1450 units/hr. No bleeding issues noted. Will increase heparin rate to keep >0.3 tomorrow.  Goal of Therapy:  Heparin level 0.3-0.7 units/ml Monitor platelets by anticoagulation protocol: Yes   Plan:  Increase heparin drip to 1550 units/hr Daily HL/CBC Monitor s/sx bleeding  [facility], Ashley.D., BCPS Clinical Pharmacist Pager: [phone] 12/29/2015 7:19 PM

## 2015-12-29 NOTE — Care Management Important Message (Signed)
Important Message  Patient Details  Name: [name] MRN: [id number] Date of Birth: 26-May-1930   Medicare Important Message Given:  Yes    [name] May 12/29/2015, 2:38 PM

## 2015-12-30 ENCOUNTER — Ambulatory Visit: Payer: Medicare Other | Admitting: Cardiovascular Disease

## 2015-12-30 LAB — RENAL FUNCTION PANEL
Albumin: 2.1 g/dL — ABNORMAL LOW (ref 3.5–5.0)
Anion gap: 13 (ref 5–15)
BUN: 17 mg/dL (ref 6–20)
CO2: 27 mmol/L (ref 22–32)
Calcium: 8.6 mg/dL — ABNORMAL LOW (ref 8.9–10.3)
Chloride: 98 mmol/L — ABNORMAL LOW (ref 101–111)
Creatinine, Ser: 3.61 mg/dL — ABNORMAL HIGH (ref 0.61–1.24)
GFR calc Af Amer: 17 mL/min — ABNORMAL LOW (ref >60)
GFR calc non Af Amer: 14 mL/min — ABNORMAL LOW (ref >60)
Glucose, Bld: 59 mg/dL — ABNORMAL LOW (ref 65–99)
Phosphorus: 2.6 mg/dL (ref 2.5–4.6)
Potassium: 4.5 mmol/L (ref 3.5–5.1)
Sodium: 138 mmol/L (ref 135–145)

## 2015-12-30 LAB — CBC
HCT: 33.4 % — ABNORMAL LOW (ref 39.0–52.0)
HCT: 36.7 % — ABNORMAL LOW (ref 39.0–52.0)
Hemoglobin: 10.5 g/dL — ABNORMAL LOW (ref 13.0–17.0)
Hemoglobin: 11.8 g/dL — ABNORMAL LOW (ref 13.0–17.0)
MCH: 26.1 pg (ref 26.0–34.0)
MCH: 26.6 pg (ref 26.0–34.0)
MCHC: 31.4 g/dL (ref 30.0–36.0)
MCHC: 32.2 g/dL (ref 30.0–36.0)
MCV: 82.9 fL (ref 78.0–100.0)
MCV: 82.8 fL (ref 78.0–100.0)
PLATELETS: 213 10*3/uL (ref 150–400)
Platelets: 234 K/uL (ref 150–400)
RBC: 4.03 MIL/uL — ABNORMAL LOW (ref 4.22–5.81)
RBC: 4.43 MIL/uL (ref 4.22–5.81)
RDW: 21 % — AB (ref 11.5–15.5)
RDW: 21.7 % — ABNORMAL HIGH (ref 11.5–15.5)
WBC: 10.1 10*3/uL (ref 4.0–10.5)
WBC: 11.4 10*3/uL — ABNORMAL HIGH (ref 4.0–10.5)

## 2015-12-30 LAB — HEPARIN LEVEL (UNFRACTIONATED)
HEPARIN UNFRACTIONATED: 0.54 [IU]/mL (ref 0.30–0.70)
Heparin Unfractionated: 0.27 IU/mL — ABNORMAL LOW (ref 0.30–0.70)

## 2015-12-30 LAB — RPR: RPR: NONREACTIVE

## 2015-12-30 MED ORDER — SODIUM CHLORIDE 0.9 % IV SOLN: 100.0000 mL | INTRAVENOUS | Status: DC | PRN

## 2015-12-30 MED ORDER — LIDOCAINE HCL (PF) 1 % IJ SOLN: 5.0000 mL | INTRAMUSCULAR | Status: DC | PRN

## 2015-12-30 MED ORDER — PENTAFLUOROPROP-TETRAFLUOROETH EX AERO: 1.0000 "application " | INHALATION_SPRAY | CUTANEOUS | Status: DC | PRN

## 2015-12-30 MED ORDER — ALTEPLASE 2 MG IJ SOLR: 2.0000 mg | Freq: Once | INTRAMUSCULAR | Status: DC | PRN

## 2015-12-30 MED ORDER — LIDOCAINE-PRILOCAINE 2.5-2.5 % EX CREA
1.0000 "application " | TOPICAL_CREAM | CUTANEOUS | Status: DC | PRN
  Filled 2015-12-30: qty 5

## 2015-12-30 MED ORDER — HEPARIN SODIUM (PORCINE) 1000 UNIT/ML DIALYSIS: 1000.0000 [IU] | INTRAMUSCULAR | Status: DC | PRN

## 2015-12-30 MED ORDER — ALTEPLASE 2 MG IJ SOLR
2.0000 mg | Freq: Once | INTRAMUSCULAR | Status: DC | PRN
  Filled 2015-12-30: qty 2

## 2015-12-30 MED ORDER — LIDOCAINE-PRILOCAINE 2.5-2.5 % EX CREA
1.0000 | TOPICAL_CREAM | CUTANEOUS | Status: DC | PRN
Start: 2015-12-30 — End: 2015-12-30

## 2015-12-30 MED ORDER — HEPARIN SODIUM (PORCINE) 1000 UNIT/ML DIALYSIS
1000.0000 [IU] | INTRAMUSCULAR | Status: DC | PRN
Start: 2015-12-30 — End: 2015-12-30

## 2015-12-30 MED ORDER — SODIUM CHLORIDE 0.9 % IV SOLN
100.0000 mL | INTRAVENOUS | Status: DC | PRN
Start: 2015-12-30 — End: 2015-12-30

## 2015-12-30 NOTE — Progress Notes (Signed)
   SUBJECTIVE: Patient answers questions appropriately. He is somewhat somnolent. He denies chest pain or dyspnea but does complain of left foot pain.   Filed Vitals:   12/29/15 1730 12/29/15 1747 12/29/15 1953 12/30/15 0412  BP: 92/48 95/49 95/42  104/43  Pulse: 72 69 69 78  Temp:  97.7 F (36.5 C) 97.7 F (36.5 C) 98.5 F (36.9 C)  TempSrc:  Oral Oral Oral  Resp:  18 18 18   Weight:  134 lb 14.7 oz (61.2 kg)  147 lb 4.3 oz (66.8 kg)  SpO2:  96% 100% 99%    LABS: Basic Metabolic Panel:  Recent Labs  12/29/15 0509  NA 140  K 3.5  CL 99*  CO2 28  GLUCOSE 82  BUN 32*  CREATININE 5.85*  CALCIUM 8.6*   CBC:  Recent Labs  12/29/15 0320 12/30/15 0305  WBC 8.7 10.1  HGB 10.4* 10.5*  HCT 33.2* 33.4*  MCV 82.8 82.9  PLT 197 213     PHYSICAL EXAM General: Well developed, frail, in no acute distress HEENT:  Normal Neck:  Supple Lungs: CTA Heart: RRR Abdomen: Soft, not tender or distended Extremities: No edema.   Neuro: Alert and oriented X 3. Vascular: femoral pulses are normal. Distal pulses are not palpable. Left foot wrapped   ASSESSMENT AND PLAN:  Critical lower limb ischemia - Recently placed peroneal stent is occluded which is the only run off to the foot. Both posterior and anterior tibial arteries are known to be occluded.  - Continue Plavix.  - Continue wound care.  -Will review approach to therapy with Dr Gwenlyn Found (attempt at revascularization vs amputation).   Chronic systolic heart failure/ICM (HCC) - follow I/O and weights - continue home rx, no sx volume overload now - continue coreg; no ACEI given borderline BP   End stage renal disease ([place]) - HD on M-W-F - Nephrology following for dialysis.    PAF (paroxysmal atrial fibrillation) (HCC) - AV paced on telemetry; continue amiodarone; resume coumadin following all procedures. Continue Heparin for now.     CAD - continue plavix and statin   Acute delerium - some improvement this AM;  neurology saw yesterday; probable component of dementia. EEG showed nonspecific diffuse cerebral dysfunction. Possible sundowning.     Previous ICD  Kirk Ruths, MD, [facility] 12/30/2015 8:06 AM

## 2015-12-30 NOTE — Clinical Social Work Note (Signed)
Clinical Social Work Assessment  Patient Details  Name: [name] MRN: [id number] Date of Birth: 1930/04/21  Date of referral:  12/29/15               Reason for consult:  Facility Placement                Permission sought to share information with:  Family Supports, Chartered certified accountant granted to share information::  Yes, Verbal Permission Granted  Name::     [name]  Agency::  [facility]  Relationship::  wife  Contact Information:     Housing/Transportation Living arrangements for the past 2 months:  [place], [place] of Information:  Spouse Patient Interpreter Needed:  None Criminal Activity/Legal Involvement Pertinent to Current Situation/Hospitalization:  No - Comment as needed Significant Relationships:  Spouse Lives with:  Spouse Do you feel safe going back to the place where you live?  No Need for family participation in patient care:  Yes (Comment) (help with decision making)  Care giving concerns:  None- plan is for pt to return to SNF for continued rehab   Social Worker assessment / plan:  CSW spoke with pt wife at bedside concerning plan for pt when ready for DC from hospital.  Pt wife confirmed that pt was at [facility] SNF getting short term rehab prior to this admission.  Pt wife states that patient might be interested in a different facility but that her priority is for the patient to return to [facility] when ready.  Employment status:  Retired Forensic scientist:  [employer] PT Recommendations:  [place] / Referral to community resources:  [place]  Patient/Family's Response to care:  Wife is agreeable to pt return to [facility] but is concerned about pt opinion- unable to speak to pt at this time due to lethargy.  Patient/Family's Understanding of and Emotional Response to Diagnosis, Current Treatment, and Prognosis:  Pt wife did not have  questions or concerns at this time but is anxiously awaiting pt surgery on Wednesday.  Emotional Assessment Appearance:  Appears stated age Attitude/Demeanor/Rapport:  Unable to Assess Affect (typically observed):  Unable to Assess Orientation:  Oriented to Self, Oriented to Place Alcohol / Substance use:  Not Applicable Psych involvement (Current and /or in the community):  No (Comment)  Discharge Needs  Concerns to be addressed:  Care Coordination, Discharge Planning Concerns Readmission within the last 30 days:  Yes Current discharge risk:  Physical Impairment Barriers to Discharge:  Continued Medical Work up   Cranford Mon, LCSW 12/30/2015, 4:11 PM

## 2015-12-30 NOTE — Progress Notes (Signed)
Wound nurse in with patient says that she will do all dressing changes.

## 2015-12-30 NOTE — Progress Notes (Signed)
[facility] ROUNDING NOTE   Subjective:   Interval History: no complaints  Objective:  Vital signs in last 24 hours:  Temp:  [97 F (36.1 C)-98.5 F (36.9 C)] 98.5 F (36.9 C) (03/07 0412) Pulse Rate:  [69-78] 78 (03/07 0412) Resp:  [16-18] 18 (03/07 0412) BP: (80-104)/(42-64) 104/43 mmHg (03/07 0412) SpO2:  [96 %-100 %] 99 % (03/07 0412) Weight:  [61.2 kg (134 lb 14.7 oz)-66.8 kg (147 lb 4.3 oz)] 66.8 kg (147 lb 4.3 oz) (03/07 0412)  Weight change:  Filed Weights   12/29/15 1345 12/29/15 1747 12/30/15 0412  Weight: 63.3 kg (139 lb 8.8 oz) 61.2 kg (134 lb 14.7 oz) 66.8 kg (147 lb 4.3 oz)   Exam:  General: no distress, calm and alert Neck: no jvd Lungs: Clear bilat Heart: RRR with S1 S2. No murmurs, rubs, or gallops appreciated. Abdomen: Soft, ntnd no mass / ascites Ext: L heel ulcer wrapped, no LE edema Neuro: Ox 3, nonfocal Dialysis Access: LUA AVF + bruit   Assessment/ Plan:  Dialysis Orders: NW MWF   4h  66.5kg  2/2.25 bath  LUA AVF  Hep none Mircera: 200 mcg IV Q 2 weeks ( last dose 12/24/15 HGB 10.6) Venofer: 50 mg IV weekly (last dose 12/24/15) Hectoral: 5 mcg IV q treatment  Assessment: 1. Ischemic L foot/ heel ulcer - Per cardiology. Recently placed stent now occluded 2. PAF: Per cards-coumadin on hold-to be started on heparin drip when INR < 2.0 3. ESRD - MWF HD 4. HypOtension chronic - on midodrine.  BP slightly soft, controlled. On midodrine 10 mg PO prior to HD MWF 5. Anemia - HGB 10.8 rec'd ESA dose 12/24/15. Follow HGB 6. Metabolic bone disease - Ca 8.4 No binders, continue hectoral  7. Nutrition - Renal diet, renal vitamin check albumin. 8. AMS - underlying dementia per neurology, w/u in progress  9. Volume -  Is at dry wt 10. Afib on heparin IV, amio/ coreg   Plan - HD tomorrow  Kelly Splinter MD [facility] pager [phone]    cell [phone] 12/30/2015, 8:08 AM    Basic Metabolic Panel:  Recent  Labs Lab 12/26/15 2102 12/29/15 0509  NA 138 140  K 3.2* 3.5  CL 96* 99*  CO2 28 28  GLUCOSE 112* 82  BUN 12 32*  CREATININE 2.71* 5.85*  CALCIUM 8.4* 8.6*    Liver Function Tests: No results for input(s): AST, ALT, ALKPHOS, BILITOT, PROT, ALBUMIN in the last 168 hours. No results for input(s): LIPASE, AMYLASE in the last 168 hours.  Recent Labs Lab 12/29/15 1245  AMMONIA 34    CBC:  Recent Labs Lab 12/26/15 2102 12/28/15 0505 12/29/15 0320 12/30/15 0305  WBC 8.9 8.6 8.7 10.1  NEUTROABS 6.3  --   --   --   HGB 10.8* 10.4* 10.4* 10.5*  HCT 34.3* 32.1* 33.2* 33.4*  MCV 82.3 84.5 82.8 82.9  PLT 196 247 197 213    Cardiac Enzymes: No results for input(s): CKTOTAL, CKMB, CKMBINDEX, TROPONINI in the last 168 hours.  BNP: Invalid input(s): POCBNP  CBG: No results for input(s): GLUCAP in the last 168 hours.  Microbiology: Results for orders placed or performed during the hospital encounter of 08/07/15  Surgical pcr screen     Status: None   Collection Time: 08/07/15  8:30 AM  Result Value Ref Range Status   MRSA, PCR NEGATIVE NEGATIVE Final   Staphylococcus aureus NEGATIVE NEGATIVE Final    Comment:  The Xpert SA Assay (FDA approved for NASAL specimens in patients over 37 years of age), is one component of a comprehensive surveillance program.  Test performance has been validated by [facility] for patients greater than or equal to 81 year old. It is not intended to diagnose infection nor to guide or monitor treatment.     Coagulation Studies:  Recent Labs  12/27/15 1557 12/28/15 0820 12/29/15 0509  LABPROT 23.1* 21.9* 20.8*  INR 2.07* 1.92* 1.79*    Urinalysis: No results for input(s): COLORURINE, LABSPEC, PHURINE, GLUCOSEU, HGBUR, BILIRUBINUR, KETONESUR, PROTEINUR, UROBILINOGEN, NITRITE, LEUKOCYTESUR in the last 72 hours.  Invalid input(s): APPERANCEUR    Imaging: Ct Head Wo Contrast  12/29/2015  CLINICAL DATA:  Recent CVA.   Confusion and cognitive decline. EXAM: CT HEAD WITHOUT CONTRAST TECHNIQUE: Contiguous axial images were obtained from the base of the skull through the vertex without intravenous contrast. COMPARISON:  CT head without contrast 11/25/2015. FINDINGS: Moderate generalized atrophy and white matter disease is stable. No acute cortical infarct, hemorrhage, or mass lesion is evident. The ventricles are proportionate to the degree of atrophy. No significant extra-axial fluid collection is present. Atherosclerotic calcifications are present in the cavernous internal carotid arteries, extending to the proximal right M1 segment. Atherosclerotic calcifications are noted at the dural margin of both vertebral arteries as well. The calvarium is intact. The paranasal sinuses and mastoid air cells are clear. No significant extracranial soft tissue lesions are evident. IMPRESSION: 1. Stable atrophy and white matter disease. This likely reflects the sequela of chronic microvascular ischemia. 2. Atherosclerosis. 3. No acute intracranial abnormality or significant interval change. Electronically Signed   By: San Morelle M.D.   On: 12/29/2015 12:07     Medications:   . heparin 1,550 Units/hr (12/30/15 0640)   . allopurinol  100 mg Oral Daily  . amiodarone  200 mg Oral Daily  . atorvastatin  40 mg Oral q1800  . carvedilol  3.125 mg Oral BID WC  . clopidogrel  75 mg Oral Q breakfast  . collagenase  1 application Topical Daily  . doxercalciferol  5 mcg Intravenous Q M,W,F-HD  . DULoxetine  30 mg Oral Daily  . feeding supplement (NEPRO CARB STEADY)  237 mL Oral BID BM  . midodrine  10 mg Oral Q M,W,F  . multivitamin with minerals  1 tablet Oral Daily  . silver sulfADIAZINE  1 application Topical Daily  . sodium chloride flush  3 mL Intravenous Q12H   sodium chloride, acetaminophen **OR** acetaminophen, camphor-menthol **AND** hydrOXYzine, docusate sodium, feeding supplement (NEPRO CARB STEADY), nitroGLYCERIN,  ondansetron **OR** ondansetron (ZOFRAN) IV, sodium chloride flush, sorbitol, zolpidem

## 2015-12-30 NOTE — Progress Notes (Signed)
ANTICOAGULATION CONSULT NOTE - Follow Up Consult  Pharmacy Consult for Heparin Indication: atrial fibrillation  No Known Allergies  Patient Measurements: Weight: 147 lb 4.3 oz (66.8 kg)  Vital Signs: Temp: 98.5 F (36.9 C) (03/07 0412) Temp Source: Oral (03/07 0412) BP: 104/43 mmHg (03/07 0412) Pulse Rate: 78 (03/07 0412)  Labs:  Recent Labs  12/27/15 1557  12/28/15 0505 12/28/15 0820  12/29/15 0320 12/29/15 0509 12/29/15 1823 12/30/15 0305  HGB  --   < > 10.4*  --   --  10.4*  --   --  10.5*  HCT  --   --  32.1*  --   --  33.2*  --   --  33.4*  PLT  --   --  247  --   --  197  --   --  213  LABPROT 23.1*  --   --  21.9*  --   --  20.8*  --   --   INR 2.07*  --   --  1.92*  --   --  1.79*  --   --   HEPARINUNFRC  --   --  <0.10*  --   < > 0.13*  --  0.31 0.27*  CREATININE  --   --   --   --   --   --  5.85*  --   --   < > = values in this interval not displayed.  Estimated Creatinine Clearance: 9 mL/min (by C-G formula based on Cr of 5.85).   Medications:  Heparin @ 1550 units/hr  Assessment: 85yom on coumadin pta for afib, admitted with suspected peroneal artery stent occlusion. Coumadin held and he was started on IV heparin 3/4. PV angio study on Monday was cancelled, Neuro consulted.   Heparin level is subtherapeutic at 0.27 on 1550 units/hr. No issues with IV line or bleed per RN.  Goal of Therapy:  Heparin level 0.3-0.7 units/ml Monitor platelets by anticoagulation protocol: Yes   Plan:  Increase heparin to 1700 units/h 8h HL, Daily HL/CBC Monitor s/sx bleeding Resume warfarin when all procedures completed  Elicia Lamp, PharmD, [facility] Clinical Pharmacist Pager [phone] 12/30/2015 9:01 AM

## 2015-12-30 NOTE — Consult Note (Addendum)
WOC wound consult note Pt is familiar to [place] from recent admission; refer to progress notes on 2/20. Reason for Consult: Consult requested for left heel, right hip and right ankle. Pt has been followed by Dr Jacqualyn Posey of the podiatry service prior to admission for an unstageable pressure injury to left heel and has been using Santyl for chemical debridement.  Wound type: Right outer hip with previous unstageable pressure injury and deep tissue injury; these have evolved into 2 stage 2 pressure injuries: 1.5X1X.1cm and .8X.8X.1cm, both are pink and moist without odor or drainage. Sacrum with stage 2 pressure injury: .5X.5X.1cm, pink and moist, scant amt yellow drainage, no odor. Left heel with unstageable pressure injury; 5X6X.1cm; 25% yellow slough, 10% black to wound edges, 65% red, mod amt yellow drainage, no odor. Left outer ankle/achilies area with full thickness wound; 7X2X.2cm, 20% red, cm, 80% slough/eschar, mod amt tan drainage, no odor Right heel with dry callous .4X.4cm, no open wound or drainage, no topical treatment indicated at this time. Pressure Ulcer POA: Yes Dressing procedure/placement/frequency: Continue present plan of care with Santyl for chemical debridement, which has already been ordered for left heel and left outer Achilles wound. Float heel to reduce pressure. Discontinue Silvadene to right hip  wounds.  Foam dressings to hip wounds and sacrum. Pt can resume follow-up with podiatrist after discharge. Discussed plan of care with patient and they deny further questions. Please re-consult if further assistance is needed. Thank-you,  Julien Girt MSN, [place], [place], [place], [place]

## 2015-12-30 NOTE — Progress Notes (Signed)
   Plan for PV angiogram tomorrow with Dr. Fletcher Anon. NPO after midnight.  The patient understands that risks include but are not limited to stroke (1 in 1000), death (1 in 34), kidney failure [usually temporary] (1 in 500), bleeding (1 in 200), allergic reaction [possibly serious] (1 in 200), and agrees to proceed.    Angelena Form PA-C  [facility]

## 2015-12-30 NOTE — Progress Notes (Signed)
ANTICOAGULATION CONSULT NOTE - Follow Up Consult  Pharmacy Consult for Heparin Indication: atrial fibrillation  No Known Allergies  Patient Measurements: Weight: 147 lb 4.3 oz (66.8 kg)  Vital Signs: Temp: 98.1 F (36.7 C) (03/07 1226) Temp Source: Oral (03/07 1226) BP: 96/38 mmHg (03/07 1801) Pulse Rate: 70 (03/07 1801)  Labs:  Recent Labs  12/28/15 0820  12/29/15 0320 12/29/15 0509 12/29/15 1823 12/30/15 0305 12/30/15 0859 12/30/15 1813  HGB  --   --  10.4*  --   --  10.5* 11.8*  --   HCT  --   --  33.2*  --   --  33.4* 36.7*  --   PLT  --   --  197  --   --  213 234  --   LABPROT 21.9*  --   --  20.8*  --   --   --   --   INR 1.92*  --   --  1.79*  --   --   --   --   HEPARINUNFRC  --   < > 0.13*  --  0.31 0.27*  --  0.54  CREATININE  --   --   --  5.85*  --   --  3.61*  --   < > = values in this interval not displayed.  Estimated Creatinine Clearance: 14.6 mL/min (by C-G formula based on Cr of 3.61).   Medications:  Heparin @ 1550 units/hr  Assessment: 85yom on coumadin pta for afib, admitted with suspected peroneal artery stent occlusion. Coumadin held and he was started on IV heparin 3/4. PV angio study on Monday was cancelled and is rescheduled for tomorrow.   Heparin level is therapeutic at 0.54 on 1700 units/hr. No bleeding noted.  Goal of Therapy:  Heparin level 0.3-0.7 units/ml Monitor platelets by anticoagulation protocol: Yes   Plan:  Continue heparin drip at 1700 units/h Daily HL/CBC Monitor s/sx bleeding Resume warfarin when all procedures completed  [facility], [employer].D., BCPS Clinical Pharmacist Pager: [phone] 12/30/2015 7:14 PM

## 2015-12-31 ENCOUNTER — Encounter (HOSPITAL_COMMUNITY)
Admission: AD | Disposition: A | Discharge status: Skilled Nursing Facility | Payer: Self-pay | Source: Ambulatory Visit | Attending: Cardiovascular Disease

## 2015-12-31 LAB — CBC
HEMATOCRIT: 33 % — AB (ref 39.0–52.0)
Hemoglobin: 10.4 g/dL — ABNORMAL LOW (ref 13.0–17.0)
MCH: 26.5 pg (ref 26.0–34.0)
MCHC: 31.5 g/dL (ref 30.0–36.0)
MCV: 84 fL (ref 78.0–100.0)
Platelets: 204 10*3/uL (ref 150–400)
RBC: 3.93 MIL/uL — AB (ref 4.22–5.81)
RDW: 21.5 % — AB (ref 11.5–15.5)
WBC: 9.1 10*3/uL (ref 4.0–10.5)

## 2015-12-31 LAB — RENAL FUNCTION PANEL
ALBUMIN: 1.8 g/dL — AB (ref 3.5–5.0)
Anion gap: 9 (ref 5–15)
BUN: 26 mg/dL — ABNORMAL HIGH (ref 6–20)
CALCIUM: 8.7 mg/dL — AB (ref 8.9–10.3)
CO2: 29 mmol/L (ref 22–32)
CREATININE: 5.1 mg/dL — AB (ref 0.61–1.24)
Chloride: 100 mmol/L — ABNORMAL LOW (ref 101–111)
GFR calc non Af Amer: 9 mL/min — ABNORMAL LOW (ref >60)
GFR, EST AFRICAN AMERICAN: 11 mL/min — AB (ref >60)
GLUCOSE: 99 mg/dL (ref 65–99)
Phosphorus: 3.1 mg/dL (ref 2.5–4.6)
Potassium: 4.1 mmol/L (ref 3.5–5.1)
SODIUM: 138 mmol/L (ref 135–145)

## 2015-12-31 LAB — PROTIME-INR
INR: 1.6 — AB (ref 0.00–1.49)
Prothrombin Time: 19.1 seconds — ABNORMAL HIGH (ref 11.6–15.2)

## 2015-12-31 LAB — HEPARIN LEVEL (UNFRACTIONATED): HEPARIN UNFRACTIONATED: 0.52 [IU]/mL (ref 0.30–0.70)

## 2015-12-31 SURGERY — INVASIVE LAB ABORTED CASE

## 2015-12-31 MED ORDER — DOXERCALCIFEROL 4 MCG/2ML IV SOLN
INTRAVENOUS | Status: AC
  Filled 2015-12-31: qty 4

## 2015-12-31 SURGICAL SUPPLY — 7 items
CATH ANGIO 5F PIGTAIL 65CM (CATHETERS) ×3 IMPLANT
KIT PV (KITS) ×4 IMPLANT
SHEATH PINNACLE 5F 10CM (SHEATH) ×3 IMPLANT
SYR MEDRAD MARK V 150ML (SYRINGE) ×4 IMPLANT
TRANSDUCER W/STOPCOCK (MISCELLANEOUS) ×4 IMPLANT
TRAY PV CATH (CUSTOM PROCEDURE TRAY) ×4 IMPLANT
WIRE HITORQ VERSACORE ST 145CM (WIRE) ×3 IMPLANT

## 2015-12-31 NOTE — Progress Notes (Signed)
  The patient refused to have his angiogram today. Nursing staff note he has been having altered mental status at times.   The patient's wife was present this afternoon and talked with him about the procedure. They both decided to proceed with it. His angiogram has been rescheduled for tomorrow, 01/01/2016 at 11:00AM with Dr. Gwenlyn Found. Will need to be NPO after midnight.  I discussed this with the patient at the bedside and he agrees to proceed. His wife will be present prior to the procedure tomorrow as well.  Signed, Erma Heritage, PA-C 12/31/2015, 5:19 PM Pager: [phone]

## 2015-12-31 NOTE — Progress Notes (Signed)
[facility] Progress Note  Dialysis Orders: NW MWF 4h 66.5kg 2/2.25 bath LUA AVF Hep none Mircera: 200 mcg IV Q 2 weeks ( last dose 12/24/15 HGB 10.6) Venofer: 50 mg IV weekly (last dose 12/24/15) Hectoral: 5 mcg IV q treatment  Assessment: 1. Ischemic L foot/ heel ulcer - Per cardiology. Recently placed stent now occluded 2. PAF: Per cards-coumadin on hold 0 on heparin/amio/coreg 3. ESRD - MWF HD K 4.1; chronic left arm swelling per pt - last AF 500s x 2 months- trending down, kinetics ok, last intervention [id number] - [id number] cephalic vein stenosis - baseline AFs 700 -800 - watch - needs follow up after discharge 4. Hypotension chronic - on midodrine. BP slightly soft, controlled. On midodrine 10 mg PO prior to HD MWF 5. Anemia - HGB 10.4 rec'd ESA dose 12/24/15 - due for redose 3/15 Follow HGB 6. Metabolic bone disease - Ca 8.4 No binders, continue hectoral  7. Nutrition - Renal diet, renal vitamin alb 1.8 -  8. AMS - underlying dementia per neurology, w/u in progress  9. Volume - ok  Myriam Jacobson, PA-C [place] [phone] 12/31/2015,10:25 AM  LOS: 5 days   Pt seen, examined and agree w A/P as above.  Kelly Splinter MD [facility] pager [phone]    cell [phone] 12/31/2015, 12:42 PM    Subjective: complaining about HD taking so long  Objective Filed Vitals:   12/31/15 0835 12/31/15 0900 12/31/15 0930 12/31/15 1000  BP: 85/43 93/47 90/42  86/42  Pulse: 70 70 70 71  Temp:      TempSrc:      Resp: 17 18 21 18   Weight:      SpO2:       Physical Exam General: chronically ill Heart: RRR Lungs: no rales Abdomen: soft NT Extremities: left foot in boot - no sig LE edema, chronic left arm edema (last intervention 05/2015) Dialysis Access: left upper AVF   Dialysis Orders: NW MWF 4h 66.5kg 2/2.25 bath LUA AVF Hep none Mircera: 200 mcg IV Q 2 weeks ( last dose 12/24/15 HGB 10.6) Venofer: 50 mg IV weekly  (last dose 12/24/15) Hectoral: 5 mcg IV q treatment   Additional Objective Labs: Lab Results  Component Value Date   INR 1.79* 12/29/2015   INR 1.92* 12/28/2015   INR 2.07* [id number]    Basic Metabolic Panel:  Recent Labs Lab 12/29/15 0509 12/30/15 0859 12/31/15 0854  NA 140 138 138  K 3.5 4.5 4.1  CL 99* 98* 100*  CO2 28 27 29   GLUCOSE 82 59* 99  BUN 32* 17 26*  CREATININE 5.85* 3.61* 5.10*  CALCIUM 8.6* 8.6* 8.7*  PHOS  --  2.6 3.1   Liver Function Tests:  Recent Labs Lab 12/30/15 0859 12/31/15 0854  ALBUMIN 2.1* 1.8*  CBC:  Recent Labs Lab 12/26/15 2102 12/28/15 0505 12/29/15 0320 12/30/15 0305 12/30/15 0859 12/31/15 0250  WBC 8.9 8.6 8.7 10.1 11.4* 9.1  NEUTROABS 6.3  --   --   --   --   --   HGB 10.8* 10.4* 10.4* 10.5* 11.8* 10.4*  HCT 34.3* 32.1* 33.2* 33.4* 36.7* 33.0*  MCV 82.3 84.5 82.8 82.9 82.8 84.0  PLT 196 247 197 213 234 204   Studies/Results: Ct Head Wo Contrast  12/29/2015  CLINICAL DATA:  Recent CVA.  Confusion and cognitive decline. EXAM: CT HEAD WITHOUT CONTRAST TECHNIQUE: Contiguous axial images were obtained from the base of the skull through the vertex without  intravenous contrast. COMPARISON:  CT head without contrast 11/25/2015. FINDINGS: Moderate generalized atrophy and white matter disease is stable. No acute cortical infarct, hemorrhage, or mass lesion is evident. The ventricles are proportionate to the degree of atrophy. No significant extra-axial fluid collection is present. Atherosclerotic calcifications are present in the cavernous internal carotid arteries, extending to the proximal right M1 segment. Atherosclerotic calcifications are noted at the dural margin of both vertebral arteries as well. The calvarium is intact. The paranasal sinuses and mastoid air cells are clear. No significant extracranial soft tissue lesions are evident. IMPRESSION: 1. Stable atrophy and white matter disease. This likely reflects the sequela of  chronic microvascular ischemia. 2. Atherosclerosis. 3. No acute intracranial abnormality or significant interval change. Electronically Signed   By: San Morelle M.D.   On: 12/29/2015 12:07   Medications: . heparin 1,700 Units/hr (12/30/15 2202)   . allopurinol  100 mg Oral Daily  . amiodarone  200 mg Oral Daily  . atorvastatin  40 mg Oral q1800  . carvedilol  3.125 mg Oral BID WC  . clopidogrel  75 mg Oral Q breakfast  . collagenase  1 application Topical Daily  . doxercalciferol  5 mcg Intravenous Q M,W,F-HD  . DULoxetine  30 mg Oral Daily  . feeding supplement (NEPRO CARB STEADY)  237 mL Oral BID BM  . midodrine  10 mg Oral Q M,W,F  . multivitamin with minerals  1 tablet Oral Daily  . sodium chloride flush  3 mL Intravenous Q12H

## 2015-12-31 NOTE — Progress Notes (Signed)
ANTICOAGULATION CONSULT NOTE - Follow Up Consult  Pharmacy Consult for Heparin Indication: atrial fibrillation  No Known Allergies  Patient Measurements: Weight: 147 lb 14.9 oz (67.1 kg)  Vital Signs: Temp: 98.3 F (36.8 C) (03/08 0450) Temp Source: Oral (03/08 0450) BP: 107/45 mmHg (03/08 0450) Pulse Rate: 71 (03/08 0450)  Labs:  Recent Labs  12/29/15 0509  12/30/15 0305 12/30/15 0859 12/30/15 1813 12/31/15 0250  HGB  --   --  10.5* 11.8*  --  10.4*  HCT  --   --  33.4* 36.7*  --  33.0*  PLT  --   --  213 234  --  204  LABPROT 20.8*  --   --   --   --   --   INR 1.79*  --   --   --   --   --   HEPARINUNFRC  --   < > 0.27*  --  0.54 0.52  CREATININE 5.85*  --   --  3.61*  --   --   < > = values in this interval not displayed.  Estimated Creatinine Clearance: 14.7 mL/min (by C-G formula based on Cr of 3.61).   Medications:  Heparin @ 1700 units/hr  Assessment: 85yom on coumadin pta for afib, admitted with suspected peroneal artery stent occlusion. Coumadin held and he was started on IV heparin 3/4. PV angio study on Monday was cancelled and is rescheduled for today.   Heparin level is therapeutic x2 at 0.52 on 1700 units/hr. Hg down to 10.4, plt wnl. No bleeding noted.  Goal of Therapy:  Heparin level 0.3-0.7 units/ml Monitor platelets by anticoagulation protocol: Yes   Plan:  Continue heparin drip at 1700 units/h Daily HL/CBC Monitor s/sx bleeding PV angio today Resume warfarin when all procedures completed  Elicia Lamp, PharmD, [facility] Clinical Pharmacist Pager [phone] 12/31/2015 8:36 AM

## 2015-12-31 NOTE — Progress Notes (Signed)
   SUBJECTIVE: Denies CP or dyspnea   Filed Vitals:   12/31/15 1105 12/31/15 1130 12/31/15 1200 12/31/15 1214  BP: 84/41 91/43 92/43  92/40  Pulse: 71 70 70 71  Temp:    97.5 F (36.4 C)  TempSrc:    Oral  Resp: 20 20 21 19   Weight:    145 lb 8.1 oz (66 kg)  SpO2:    99%    LABS: Basic Metabolic Panel:  Recent Labs  12/30/15 0859 12/31/15 0854  NA 138 138  K 4.5 4.1  CL 98* 100*  CO2 27 29  GLUCOSE 59* 99  BUN 17 26*  CREATININE 3.61* 5.10*  CALCIUM 8.6* 8.7*  PHOS 2.6 3.1   CBC:  Recent Labs  12/30/15 0859 12/31/15 0250  WBC 11.4* 9.1  HGB 11.8* 10.4*  HCT 36.7* 33.0*  MCV 82.8 84.0  PLT 234 204     PHYSICAL EXAM General: Well developed, frail, in no acute distress HEENT:  Normal Neck:  Supple Lungs: CTA Heart: RRR Abdomen: Soft, not tender or distended Extremities: No edema.   Neuro: Alert and oriented X 3. Vascular: femoral pulses are normal. Distal pulses are not palpable. Left foot wrapped   ASSESSMENT AND PLAN:  Critical lower limb ischemia - Recently placed peroneal stent is occluded which is the only run off to the foot. Both posterior and anterior tibial arteries are known to be occluded.  - Continue Plavix.  - Continue wound care.  -For angiogram today with Dr Fletcher Anon.  Chronic systolic heart failure/ICM (HCC) - follow I/O and weights - continue home rx, no sx volume overload now - continue coreg; no ACEI given borderline BP   End stage renal disease ([place]) - HD on M-W-F - Nephrology following for dialysis.    PAF (paroxysmal atrial fibrillation) (HCC) - AV paced on telemetry; continue amiodarone; resume coumadin following all procedures. Continue Heparin for now.     CAD - continue plavix and statin   Acute delerium - Improved    Previous ICD  Kirk Ruths, MD, [facility] 12/31/2015 12:47 PM

## 2015-12-31 NOTE — Progress Notes (Signed)
The patient was brought earlier to the [facility] lab for planned angiogram. While he was there, he communicated that he did not want to have the procedure done. I spent 15-20 minutes discussing the procedure with him and the indications. He said that he was tired of having procedures done and wants to be left alone. When I explained to him the risk of amputation, he mentioned that he understood and he was okay with that. I tried to call his wife but there was no answer. It appears that later he changed his mind after he discussed with our PA. I came to see him this evening and he mentioned to me that he is not sure if he wants to have the procedure done or not. He said that he might agree to it at some point. This is overall a difficult situation. The patient's functional capacity is reduced and his leg might not be salvageable regardless of what we do. Below the knee amputation might actually be the right option for him if his leg does not heal. I discussed the case with Dr.Berry who agrees. I recommend palliative care consult and continued wound care.

## 2016-01-01 ENCOUNTER — Encounter (HOSPITAL_COMMUNITY)
Admission: AD | Disposition: A | Discharge status: Skilled Nursing Facility | Payer: Self-pay | Source: Ambulatory Visit | Attending: Cardiovascular Disease

## 2016-01-01 LAB — CBC
HEMATOCRIT: 36.4 % — AB (ref 39.0–52.0)
Hemoglobin: 11.5 g/dL — ABNORMAL LOW (ref 13.0–17.0)
MCH: 27.1 pg (ref 26.0–34.0)
MCHC: 31.6 g/dL (ref 30.0–36.0)
MCV: 85.6 fL (ref 78.0–100.0)
PLATELETS: 180 10*3/uL (ref 150–400)
RBC: 4.25 MIL/uL (ref 4.22–5.81)
RDW: 22.1 % — AB (ref 11.5–15.5)
WBC: 8.1 10*3/uL (ref 4.0–10.5)

## 2016-01-01 LAB — HEPARIN LEVEL (UNFRACTIONATED): HEPARIN UNFRACTIONATED: 0.52 [IU]/mL (ref 0.30–0.70)

## 2016-01-01 SURGERY — ABDOMINAL AORTOGRAM W/LOWER EXTREMITY
Anesthesia: LOCAL

## 2016-01-01 MED ORDER — WARFARIN SODIUM 3 MG PO TABS
3.0000 mg | ORAL_TABLET | Freq: Once | ORAL | Status: AC
  Administered 2016-01-01: 3 mg via ORAL
  Filled 2016-01-01: qty 1

## 2016-01-01 MED ORDER — PRO-STAT SUGAR FREE PO LIQD
30.0000 mL | Freq: Two times a day (BID) | ORAL | Status: DC
  Administered 2016-01-01 – 2016-01-04 (×6): 30 mL via ORAL
  Filled 2016-01-01 (×6): qty 30

## 2016-01-01 MED ORDER — WARFARIN - PHARMACIST DOSING INPATIENT: Freq: Every day | Status: DC

## 2016-01-01 NOTE — Care Management Note (Addendum)
Case Management Note  Patient Details  Name: ATWOOD WARDROP MRN: [id number] Date of Birth: [birth year]  Subjective/Objective: 82 y.o. M admitted 12/26/2015 with Critical Lower limb Ischemia and nonhealing Ulcer on L heel.  Pt is s/p Stent placement  Left proximal  Perineal Artery which was suspicious for Occlusion. Hx CAD, ESRD on HD, Hx MI, PAF on Amiodarone and Coumadin. S/p ICD                    Action/Plan: While hospitalized pt refused Angiogram x2, (AMS)  despite warnings that the end result may be loss of limb. Coumadin restarted 01/01/2016 and pt will be ready for SNF when INR therapeutic.(probably Monday per MD).  CM made CSW aware and will continue to be available should disposition needs change.    Expected Discharge Date:                  Expected Discharge Plan:  Skilled Nursing Facility  In-House Referral:  Clinical Social Work  Discharge planning Services  CM Consult  Post Acute Care Choice:    Choice offered to:  Spouse  DME Arranged:    DME Agency:     HH Arranged:    [place] Agency:     Status of Service:  In process, will continue to follow  Medicare Important Message Given:  Yes Date Medicare IM Given:    Medicare IM give by:    Date Additional Medicare IM Given:    Additional Medicare Important Message give by:     If discussed at [place] of Stay Meetings, dates discussed:  01/01/2016  Additional Comments:  Delrae Sawyers, RN 01/01/2016, 2:02 PM

## 2016-01-01 NOTE — Progress Notes (Signed)
Subjective:  PV angio/ intervention cancelled for today. Discussed with wife. Will require SNF  Objective:  Temp:  [97.5 F (36.4 C)-99.2 F (37.3 C)] 98 F (36.7 C) (03/09 0542) Pulse Rate:  [69-71] 69 (03/09 1000) Resp:  [18-21] 20 (03/09 0542) BP: (92-115)/(38-43) 107/42 mmHg (03/09 1000) SpO2:  [99 %-100 %] 100 % (03/09 0542) Weight:  [142 lb 4.8 oz (64.547 kg)-145 lb 8.1 oz (66 kg)] 142 lb 4.8 oz (64.547 kg) (03/08 1950) Weight change: -3.5 oz (-0.1 kg)  Intake/Output from previous day: 03/08 0701 - 03/09 0700 In: 120 [P.O.:120] Out: 700   Intake/Output from this shift:    Physical Exam: General appearance: alert and slowed mentation Neck: no adenopathy, no carotid bruit, no JVD, supple, symmetrical, trachea midline and thyroid not enlarged, symmetric, no tenderness/mass/nodules Lungs: clear to auscultation bilaterally Heart: regular rate and rhythm, S1, S2 normal, no murmur, click, rub or gallop  Lab Results: Results for orders placed or performed during the hospital encounter of 12/26/15 (from the past 48 hour(s))  Heparin level (unfractionated)     Status: None   Collection Time: 12/30/15  6:13 PM  Result Value Ref Range   Heparin Unfractionated 0.54 0.30 - 0.70 IU/mL    Comment:        IF HEPARIN RESULTS ARE BELOW EXPECTED VALUES, AND PATIENT DOSAGE HAS BEEN CONFIRMED, SUGGEST FOLLOW UP TESTING OF ANTITHROMBIN III LEVELS.   Heparin level (unfractionated)     Status: None   Collection Time: 12/31/15  2:50 AM  Result Value Ref Range   Heparin Unfractionated 0.52 0.30 - 0.70 IU/mL    Comment:        IF HEPARIN RESULTS ARE BELOW EXPECTED VALUES, AND PATIENT DOSAGE HAS BEEN CONFIRMED, SUGGEST FOLLOW UP TESTING OF ANTITHROMBIN III LEVELS.   CBC     Status: Abnormal   Collection Time: 12/31/15  2:50 AM  Result Value Ref Range   WBC 9.1 4.0 - 10.5 K/uL   RBC 3.93 (L) 4.22 - 5.81 MIL/uL   Hemoglobin 10.4 (L) 13.0 - 17.0 g/dL   HCT 33.0 (L) 39.0 -  52.0 %   MCV 84.0 78.0 - 100.0 fL   MCH 26.5 26.0 - 34.0 pg   MCHC 31.5 30.0 - 36.0 g/dL   RDW 21.5 (H) 11.5 - 15.5 %   Platelets 204 150 - 400 K/uL  Renal function panel     Status: Abnormal   Collection Time: 12/31/15  8:54 AM  Result Value Ref Range   Sodium 138 135 - 145 mmol/L   Potassium 4.1 3.5 - 5.1 mmol/L   Chloride 100 (L) 101 - 111 mmol/L   CO2 29 22 - 32 mmol/L   Glucose, Bld 99 65 - 99 mg/dL   BUN 26 (H) 6 - 20 mg/dL   Creatinine, Ser 5.10 (H) 0.61 - 1.24 mg/dL   Calcium 8.7 (L) 8.9 - 10.3 mg/dL   Phosphorus 3.1 2.5 - 4.6 mg/dL   Albumin 1.8 (L) 3.5 - 5.0 g/dL   GFR calc non Af Amer 9 (L) >60 mL/min   GFR calc Af Amer 11 (L) >60 mL/min    Comment: (NOTE) The eGFR has been calculated using the CKD EPI equation. This calculation has not been validated in all clinical situations. eGFR's persistently <60 mL/min signify possible Chronic Kidney Disease.    Anion gap 9 5 - 15  Protime-INR     Status: Abnormal   Collection Time: 12/31/15 10:53 AM  Result Value  Ref Range   Prothrombin Time 19.1 (H) 11.6 - 15.2 seconds   INR 1.60 (H) 0.00 - 1.49  Heparin level (unfractionated)     Status: None   Collection Time: 01/01/16  2:59 AM  Result Value Ref Range   Heparin Unfractionated 0.52 0.30 - 0.70 IU/mL    Comment:        IF HEPARIN RESULTS ARE BELOW EXPECTED VALUES, AND PATIENT DOSAGE HAS BEEN CONFIRMED, SUGGEST FOLLOW UP TESTING OF ANTITHROMBIN III LEVELS.   CBC     Status: Abnormal   Collection Time: 01/01/16  2:59 AM  Result Value Ref Range   WBC 8.1 4.0 - 10.5 K/uL   RBC 4.25 4.22 - 5.81 MIL/uL   Hemoglobin 11.5 (L) 13.0 - 17.0 g/dL   HCT 36.4 (L) 39.0 - 52.0 %   MCV 85.6 78.0 - 100.0 fL   MCH 27.1 26.0 - 34.0 pg   MCHC 31.6 30.0 - 36.0 g/dL   RDW 22.1 (H) 11.5 - 15.5 %   Platelets 180 150 - 400 K/uL    Imaging: Imaging results have been reviewed  Tele- AV paced rhythm ( I personally reviewed)  Assessment/Plan:   1. Principal Problem: 2.    Critical lower limb ischemia 3. Active Problems: 4.   Chronic systolic heart failure ([place]) 5.   End stage renal disease ([place]) 6.   PAD (peripheral artery disease) ([place]) 7.   PAF (paroxysmal atrial fibrillation) ([place]) 8.   Essential hypertension 9.   Acute encephalopathy 10.   Time Spent Directly with Patient:  20 minutes  Length of Stay:  LOS: 6 days   I have had a long discussion with the patient's wife in the presence of the patient. I reviewed his peripheral angiogram and discussed the case with Dr. Fletcher Anon. [name] has critical limb ischemia with a nonhealing ulcer on his left heel. He had angiography approximately 3 weeks ago which revealed an occluded anterior and posterior tibial artery with high-grade proximal left peroneal artery stenosis which was stented with a drug-eluting stent. Unfortunately, Dopplers performed 2 weeks postintervention revealed the stent to have occluded in the interim. He does have altered mental status and a dementia workup is in progress. He has chronic renal insufficiency on hemodialysis. He has paroxysmal atrial fibrillation on amiodarone, AV paced currently on heparin (was on Coumadin prior to admission). Given his age, peripheral anatomy, comorbidities and mental status I do not feel that repeat procedure is warranted at this time. I suspect that his wound will not heal and he ultimately will require a left BKA. I discussed this with the patient's wife. She is unable to care for him at home and have initiated skilled nursing facility placement with the case manager. I will restart Coumadin per pharmacy and he will be discharged home once he is therapeutic.Marland Kitchen  Quay Burow 01/01/2016, 11:36 AM

## 2016-01-01 NOTE — Progress Notes (Addendum)
Initial Nutrition Assessment  DOCUMENTATION CODES:   Not applicable  INTERVENTION:    Continue Nepro Shake po BID, each supplement provides 425 kcal and 19 grams protein  Prostat po 30 ml BID with meals, each supplement provides 100 kcal, 15 grams protein   Continue multivitamin with minerals daily  NUTRITION DIAGNOSIS:   Increased nutrient needs related to wound healing as evidenced by estimated needs  GOAL:   Patient will meet greater than or equal to 90% of their needs   MONITOR:   PO intake, Supplement acceptance, Labs, Weight trends, Skin, I & O's  REASON FOR ASSESSMENT:   Low Braden  ASSESSMENT:   82 y.o. Male with a history of CAD (ant-lat MI s/p PTCA/DES to LAD 2006), HTN, HLD, ESRD on HD, paroxysmal atrial fib on amiodarone/Coumadin (NSR by interrogation 10/2015), ICM/chronic systolic CHF (s/p ICD with upgrade to [employer] BiV-ICD 07/2015 when it had reached ERI), GIB 03/2015 in setting of elevated INR, WCT felt to be atrial tachycardia 06/2015, anemia of chronic disease, hypothyroidism who presented to [facility] today initially for planned PV angio. He recently saw podiatry for a left heel ulcer which has been present for the last month. Dopplers performed on 12/05/15 revealed occluded tibials bilaterally.   RD unable to obtain nutrition hx.  Noted refused his angiogram today. Increased nutrient needs given wound healing. PO intake poor at 20% per flowsheet records. Low braden score places patient at risk for further skin breakdown. Nepro Shake supplement & MVI orders in place.  Will add liquid protein.  RD unable to complete Nutrition Focused Physical Exam at this time.  Diet Order:  Diet renal with fluid restriction Fluid restriction:: 1200 mL Fluid; Room service appropriate?: Yes; Fluid consistency:: Thin  Skin:     Right outer hip with 2 stage 2 pressure injuries Sacrum with stage 2 pressure injury Left heel with unstageable pressure injury Left outer  ankle/achilies area with full thickness wound  Last BM:  3/6  Height:   Ht Readings from Last 1 Encounters:  12/15/15 5\' 11"  (1.803 m)    Weight:   Wt Readings from Last 1 Encounters:  12/31/15 142 lb 4.8 oz (64.547 kg)    Ideal Body Weight:  78.1 kg  BMI:  Body mass index is 19.86 kg/(m^2).  Estimated Nutritional Needs:   Kcal:  1500-1700  Protein:  70-80 gm  Fluid:  1.5-1.7 L  EDUCATION NEEDS:   No education needs identified at this time  Arthur Holms, RD, LDN Pager #: [phone] After-Hours Pager #: [phone]

## 2016-01-01 NOTE — Progress Notes (Addendum)
ANTICOAGULATION CONSULT NOTE - Follow Up Consult  Pharmacy Consult for Heparin/Warfarin Indication: atrial fibrillation  No Known Allergies  Patient Measurements: Weight: 142 lb 4.8 oz (64.547 kg)  Vital Signs: Temp: 98 F (36.7 C) (03/09 0542) Temp Source: Oral (03/09 0542) BP: 110/42 mmHg (03/09 0542) Pulse Rate: 70 (03/09 0542)  Labs:  Recent Labs  12/30/15 0859 12/30/15 1813 12/31/15 0250 12/31/15 0854 12/31/15 1053 01/01/16 0259  HGB 11.8*  --  10.4*  --   --  11.5*  HCT 36.7*  --  33.0*  --   --  36.4*  PLT 234  --  204  --   --  180  LABPROT  --   --   --   --  19.1*  --   INR  --   --   --   --  1.60*  --   HEPARINUNFRC  --  0.54 0.52  --   --  0.52  CREATININE 3.61*  --   --  5.10*  --   --     Estimated Creatinine Clearance: 10 mL/min (by C-G formula based on Cr of 5.1).   Medications:  Heparin @ 1700 units/hr  Assessment: 85yom on coumadin pta for afib, admitted with suspected peroneal artery stent occlusion. Coumadin held and he was started on IV heparin 3/4. Pharmacy consulted to restart warfarin on 3/9. PV angio study on Monday was cancelled and is rescheduled for 3/8 - patient refused. May need BKA in future.  Heparin level remains therapeutic at 0.52 on 1700 units/hr. Hg up to 11.5, plt wnl. INR 1.6 yesterday. No bleeding noted.  PTA dose: 3mg  daily except 1.5mg  on Sunday - last dose 3/2  Goal of Therapy:  Heparin level 0.3-0.7 units/ml  INR 2-3 Monitor platelets by anticoagulation protocol: Yes   Plan:  Continue heparin drip at 1700 units/h - dc when INR therapeutic Warfarin 3mg  x 1 dose tonight Daily HL/INRCBC Monitor s/sx bleeding  Elicia Lamp, PharmD, BCPS Clinical Pharmacist Pager [phone] 01/01/2016 8:27 AM

## 2016-01-01 NOTE — Progress Notes (Signed)
[name] Progress Note  Dialysis Orders: [facility] MWF 4h 66.5kg 2/2.25 bath LUA AVF Hep none Mircera: 200 mcg IV Q 2 weeks ( last dose 12/24/15 HGB 10.6) Venofer: 50 mg IV weekly (last dose 12/24/15) Hectoral: 5 mcg IV q treatment  Assessment: 1. Ischemic L foot/ heel ulcer - per cardiology, ulcer may not heal. Plan resume coumadin, look for SNF, ultimately will likely require amputation.  2. PAF: Per cards-coumadin restarted today, also on heparin/amio/coreg 3. ESRD - MWF HD K 4.1; chronic left arm swelling per pt - last AF 500s x 2 months- trending down, kinetics ok, last intervention 123XX123 - [id number] cephalic vein stenosis - baseline AFs 700 -800 - watch - needs follow up after discharge 4. Hypotension chronic - on midodrine. BP slightly soft, controlled. On midodrine 10 mg PO prior to HD MWF 5. Anemia - HGB 10.4 rec'd ESA dose 12/24/15 - due for redose 3/15 Follow HGB 6. Metabolic bone disease - Ca 8.4 No binders, continue hectoral  7. Nutrition - Renal diet, renal vitamin alb 1.8 -  8. AMS - underlying dementia per neurology. B12 and tsh normal.  9. Volume - under dry weight, no edema  Plan - HD Friday   Kelly Splinter MD [name] pager [phone]    cell [phone] 01/01/2016, 1:38 PM    Subjective: complaining about HD taking so long  Objective Filed Vitals:   12/31/15 1456 12/31/15 1950 01/01/16 0542 01/01/16 1000  BP: 100/38 115/42 110/42 107/42  Pulse: 70 71 70 69  Temp: 97.8 F (36.6 C) 99.2 F (37.3 C) 98 F (36.7 C)   TempSrc: Oral Oral Oral   Resp: 19 18 20    Weight:  64.547 kg (142 lb 4.8 oz)    SpO2: 99% 100% 100%    Physical Exam General: chronically ill Heart: RRR Lungs: no rales Abdomen: soft NT Extremities: left foot in boot - no sig LE edema, chronic left arm edema (last intervention 05/2015) Dialysis Access: left upper AVF   Dialysis Orders: [facility] MWF 4h 66.5kg 2/2.25 bath LUA AVF Hep none Mircera: 200  mcg IV Q 2 weeks ( last dose 12/24/15 HGB 10.6) Venofer: 50 mg IV weekly (last dose 12/24/15) Hectoral: 5 mcg IV q treatment   Additional Objective Labs: Lab Results  Component Value Date   INR 1.60* 12/31/2015   INR 1.79* 12/29/2015   INR 1.92* 123XX123    Basic Metabolic Panel:  Recent Labs Lab 12/29/15 0509 12/30/15 0859 12/31/15 0854  NA 140 138 138  K 3.5 4.5 4.1  CL 99* 98* 100*  CO2 28 27 29   GLUCOSE 82 59* 99  BUN 32* 17 26*  CREATININE 5.85* 3.61* 5.10*  CALCIUM 8.6* 8.6* 8.7*  PHOS  --  2.6 3.1   Liver Function Tests:  Recent Labs Lab 12/30/15 0859 12/31/15 0854  ALBUMIN 2.1* 1.8*  CBC:  Recent Labs Lab 12/26/15 2102  12/29/15 0320 12/30/15 0305 12/30/15 0859 12/31/15 0250 01/01/16 0259  WBC 8.9  < > 8.7 10.1 11.4* 9.1 8.1  NEUTROABS 6.3  --   --   --   --   --   --   HGB 10.8*  < > 10.4* 10.5* 11.8* 10.4* 11.5*  HCT 34.3*  < > 33.2* 33.4* 36.7* 33.0* 36.4*  MCV 82.3  < > 82.8 82.9 82.8 84.0 85.6  PLT 196  < > 197 213 234 204 180  < > = values in this interval not displayed. Studies/Results: No results  found. Medications: . heparin 1,700 Units/hr (01/01/16 0648)   . allopurinol  100 mg Oral Daily  . amiodarone  200 mg Oral Daily  . atorvastatin  40 mg Oral q1800  . carvedilol  3.125 mg Oral BID WC  . clopidogrel  75 mg Oral Q breakfast  . collagenase  1 application Topical Daily  . doxercalciferol  5 mcg Intravenous Q M,W,F-HD  . DULoxetine  30 mg Oral Daily  . feeding supplement (NEPRO CARB STEADY)  237 mL Oral BID BM  . midodrine  10 mg Oral Q M,W,F  . multivitamin with minerals  1 tablet Oral Daily  . sodium chloride flush  3 mL Intravenous Q12H  . warfarin  3 mg Oral ONCE-1800  . Warfarin - Pharmacist Dosing Inpatient   Does not apply [phone]

## 2016-01-02 LAB — CBC
HCT: 35.1 % — ABNORMAL LOW (ref 39.0–52.0)
Hemoglobin: 10.9 g/dL — ABNORMAL LOW (ref 13.0–17.0)
MCH: 26.2 pg (ref 26.0–34.0)
MCHC: 31.1 g/dL (ref 30.0–36.0)
MCV: 84.4 fL (ref 78.0–100.0)
PLATELETS: 168 10*3/uL (ref 150–400)
RBC: 4.16 MIL/uL — ABNORMAL LOW (ref 4.22–5.81)
RDW: 21.8 % — AB (ref 11.5–15.5)
WBC: 8.7 10*3/uL (ref 4.0–10.5)

## 2016-01-02 LAB — PROTIME-INR
INR: 1.6 — ABNORMAL HIGH (ref 0.00–1.49)
Prothrombin Time: 19.1 seconds — ABNORMAL HIGH (ref 11.6–15.2)

## 2016-01-02 LAB — HEPARIN LEVEL (UNFRACTIONATED): Heparin Unfractionated: 0.56 IU/mL (ref 0.30–0.70)

## 2016-01-02 MED ORDER — WARFARIN SODIUM 3 MG PO TABS
3.0000 mg | ORAL_TABLET | Freq: Once | ORAL | Status: AC
  Administered 2016-01-02: 3 mg via ORAL
  Filled 2016-01-02 (×2): qty 1

## 2016-01-02 MED ORDER — DOXERCALCIFEROL 4 MCG/2ML IV SOLN
INTRAVENOUS | Status: AC
  Administered 2016-01-02: 5 ug via INTRAVENOUS
  Filled 2016-01-02: qty 4

## 2016-01-02 MED ORDER — MIDODRINE HCL 5 MG PO TABS
ORAL_TABLET | ORAL | Status: AC
  Administered 2016-01-02: 10 mg via ORAL
  Filled 2016-01-02: qty 2

## 2016-01-02 NOTE — Progress Notes (Signed)
ANTICOAGULATION CONSULT NOTE - Follow Up Consult  Pharmacy Consult for Heparin/Warfarin Indication: atrial fibrillation  No Known Allergies  Patient Measurements: Weight: 130 lb 15.3 oz (59.4 kg)  Vital Signs: Temp: 97.9 F (36.6 C) (03/10 1026) Temp Source: Oral (03/10 1026) BP: 99/39 mmHg (03/10 1026) Pulse Rate: 70 (03/10 1026)  Labs:  Recent Labs  12/31/15 0250 12/31/15 0854 12/31/15 1053 01/01/16 0259 01/02/16 0315  HGB 10.4*  --   --  11.5* 10.9*  HCT 33.0*  --   --  36.4* 35.1*  PLT 204  --   --  180 168  LABPROT  --   --  19.1*  --  19.1*  INR  --   --  1.60*  --  1.60*  HEPARINUNFRC 0.52  --   --  0.52 0.56  CREATININE  --  5.10*  --   --   --     Estimated Creatinine Clearance: 9.2 mL/min (by C-G formula based on Cr of 5.1).   Assessment: 85yom on Coumadin 3mg  daily exc 1.5mg  on Sunday PTA for Afib. Last dose was 3/2. S/p placement of DES in his peroneal artery that now has evidence of stent occlusion on doppler studies, coumadin held, INR reversed with 5mg  vitamin k, heparin to start once < 2 on 3/4. HL remains therapeutic at 0.56 this am. Coumadin restarted 3/9. INR 1.6 today. PV angio study rescheduled for 3/8 - pt refused. May need BKA. Hgb stable at 10.9, plt wnl, no bleeding.  Goal of Therapy:  Heparin level 0.3-0.7 units/ml  INR 2-3 Monitor platelets by anticoagulation protocol: Yes   Plan:  Continue heparin gtt at 1700 units/hr (d/c when INR therapeutic) Give coumadin 3mg  x 1 tonight Monitor daily INR and HL, CBC, s/s of bleed  Elenor Quinones, PharmD, [facility] Clinical Pharmacist Pager [phone] 01/02/2016 10:47 AM

## 2016-01-02 NOTE — Progress Notes (Signed)
CSW informed of pt medically stability for return to [facility].  CSW spoke with pt wife who expressed concerns about pt inability to sit at this time and concerns that pt might need higher level of care. CSW spoke with dialysis secretary who confirmed pt has been receiving dialysis in the bed during this admission.  CSW requested PT consult and spoke with RN concern having pt sit up in chair  Pt is not safe to DC back to SNF until he is able to tolerate sitting during outpatient dialysis.  CSW will continue to follow  Domenica Reamer, [place] Social Worker [phone]

## 2016-01-02 NOTE — Procedures (Signed)
No compliants, reserved as usual.  Told him we are looking for a nursing home for him and he expressed his excitement about getting out of the hospital. Stable from renal standpoint.  No vol excess, keeping even on HD.     I was present at this dialysis session, have reviewed the session itself and made  appropriate changes Kelly Splinter MD [place] pager [phone]    cell [phone] 01/02/2016, 9:21 AM

## 2016-01-02 NOTE — Evaluation (Signed)
Physical Therapy Evaluation Patient Details Name: [name] MRN: [id number] DOB: May 19, 1930 Today's Date: 01/02/2016   History of Present Illness  Patient is a 82 y/o male with hx of CAD, HTN, HLD, ESRD on HD, paroxysmal A-fib, ICM/chronic systolic CHF, hypothyroidism presents with nonhealing ulcer on the left heel s/p placement of DES in the peroneal artery, found to be occluded. Attempt made a revascularization this admission but pt refusing. Pt admitted from 02/20>>02/24 for PV cath with PTA to the L proximal peroneal artery  Clinical Impression  Patient presents with confusion, decreased strength, balance, endurance and activity tolerance impacting safe mobility. Not able to stand upright with assist of 2 due to posterior lean and weakness. Pt high fall risk. Tolerated squat pivot transfer to chair with Mod A of 2. Pt recently admitted to SNF end of Feb and plans to return there to improve function and mobility. Will follow acutely.     Follow Up Recommendations SNF    Equipment Recommendations  None recommended by PT    Recommendations for Other Services       Precautions / Restrictions Precautions Precautions: Fall Restrictions Weight Bearing Restrictions: No      Mobility  Bed Mobility Overal bed mobility: Needs Assistance Bed Mobility: Supine to Sit     Supine to sit: Max assist;HOB elevated     General bed mobility comments: assist to lift trunk, and bring LEs to EOB. posterior lean  Transfers Overall transfer level: Needs assistance Equipment used: Rolling walker (2 wheeled) Transfers: Sit to/from [employer] Sit to Stand: Max assist;+2 physical assistance   Squat pivot transfers: Mod assist;+2 physical assistance     General transfer comment: Assist of 2 to stand x2; unable to get fully upright with hip/knees in flexion and heavy posterior bias at hips. Squat pivot transfer to chair with Mod A of 2.  Ambulation/Gait                 Stairs            Wheelchair Mobility    Modified Rankin (Stroke Patients Only)       Balance Overall balance assessment: Needs assistance Sitting-balance support: Feet supported;Bilateral upper extremity supported Sitting balance-Leahy Scale: Zero Sitting balance - Comments: posterior lean requiring UE support and Mod A initially for balance; once repositioned on EOB, Min guard assist and MIn A at times for balance esp when fatigued.    Standing balance support: During functional activity Standing balance-Leahy Scale: Zero Standing balance comment: Unable to stand fully upright even with assist of 2.                              Pertinent Vitals/Pain Pain Assessment: Faces Faces Pain Scale: No hurt    Home Living Family/patient expects to be discharged to:: Skilled nursing facility Living Arrangements: Spouse/significant other Available Help at Discharge: Family Type of Home: House Home Access: Stairs to enter Entrance Stairs-Rails: Right Entrance Stairs-Number of Steps: 7 Home Layout: Two level;Laundry or work area in [place]: Environmental consultant - 2 wheels;Bedside commode;Grab bars - tub/shower;Shower seat      Prior Function Level of Independence: Needs assistance   Gait / Transfers Assistance Needed: Per MD note, pt has not been ambulatory since being d/[facility] end of February.  ADL's / Homemaking Assistance Needed: Assuming assist with ADLs at SNF.        Hand Dominance  Extremity/Trunk Assessment   Upper Extremity Assessment: Defer to OT evaluation RUE Deficits / Details: noted unable to lift shoulders more than about 30 degrees, noted arthritic changes in hands     LUE Deficits / Details: noted unable to lift shoulders more than about 30 degrees, noted arthritic changes in hands     RLE Deficits / Details: joints with signs of arthritis in knees; Grossly ~2+/5 knee extension. LLE Deficits / Details: joints with  signs of arthritis in knees; grossly ~2+/5 knee extension     Communication   Communication: No difficulties  Cognition Arousal/Alertness: Awake/alert Behavior During Therapy: WFL for tasks assessed/performed Overall Cognitive Status: Impaired/Different from baseline Area of Impairment: Orientation;Following commands;Safety/judgement;Problem solving Orientation Level: Disoriented to;Time;Situation   Memory: Decreased short-term memory Following Commands: Follows one step commands inconsistently;Follows one step commands with increased time Safety/Judgement: Decreased awareness of deficits;Decreased awareness of safety   Problem Solving: Decreased initiation;Requires verbal cues;Requires tactile cues General Comments: Reports being in [place].    General Comments General comments (skin integrity, edema, etc.): No family present during session.     Exercises        Assessment/Plan    PT Assessment Patient needs continued PT services  PT Diagnosis Altered mental status;Difficulty walking;Generalized weakness   PT Problem List Decreased strength;Decreased cognition;Decreased activity tolerance;Decreased mobility;Decreased skin integrity;Decreased balance;Decreased safety awareness;Decreased knowledge of use of DME  PT Treatment Interventions Balance training;Functional mobility training;Therapeutic activities;Therapeutic exercise;Wheelchair mobility training;Patient/family education;Gait training;DME instruction   PT Goals (Current goals can be found in the Care Plan section) Acute Rehab PT Goals Patient Stated Goal: none stated PT Goal Formulation: Patient unable to participate in goal setting Time For Goal Achievement: 01/16/16 Potential to Achieve Goals: Fair    Frequency Min 2X/week   Barriers to discharge        Co-evaluation               End of Session Equipment Utilized During Treatment: Gait belt Activity Tolerance: Patient tolerated treatment  well;Patient limited by fatigue Patient left: in chair;with call bell/phone within reach;with chair alarm set Nurse Communication: Other (comment);Mobility status (tech present to assist with transfer; RN notified. Recommend stedy)         Time: [id number] PT Time Calculation (min) (ACUTE ONLY): 23 min   Charges:   PT Evaluation $PT Eval Moderate Complexity: 1 Procedure PT Treatments $Therapeutic Activity: 8-22 mins   PT G Codes:        Aiman Sonn A Mihaela Fajardo 01/02/2016, 4:29 PM  Wray Kearns, [place], DPT [phone]

## 2016-01-02 NOTE — Progress Notes (Signed)
   SUBJECTIVE: Denies CP or dyspnea   Filed Vitals:   01/02/16 0800 01/02/16 0830 01/02/16 0900 01/02/16 1026  BP: 100/48 88/45 92/44  99/39  Pulse: 71 71 72 70  Temp:    97.9 F (36.6 C)  TempSrc:    Oral  Resp: 22 16 16 19   Weight:    130 lb 15.3 oz (59.4 kg)  SpO2:    98%    LABS: Basic Metabolic Panel:  Recent Labs  12/31/15 0854  NA 138  K 4.1  CL 100*  CO2 29  GLUCOSE 99  BUN 26*  CREATININE 5.10*  CALCIUM 8.7*  PHOS 3.1   CBC:  Recent Labs  01/01/16 0259 01/02/16 0315  WBC 8.1 8.7  HGB 11.5* 10.9*  HCT 36.4* 35.1*  MCV 85.6 84.4  PLT 180 168     PHYSICAL EXAM General: Well developed, frail, in no acute distress HEENT:  Normal Neck:  Supple Lungs: CTA Heart: RRR Abdomen: Soft, not tender or distended Extremities: No edema.   Neuro: Alert and oriented X 3. Vascular: femoral pulses are normal. Distal pulses are not palpable. Left foot wrapped   ASSESSMENT AND PLAN:  Critical lower limb ischemia - Recently placed peroneal stent is occluded which is the only run off to the foot. Both posterior and anterior tibial arteries are known to be occluded.  - Continue Plavix.  - Continue wound care.  -Decision made to not pursue additional procedures per Dr Gwenlyn Found; will be followed as outpt; may need amputation in the future.   Chronic systolic heart failure/ICM (HCC) - follow I/O and weights - continue home rx, no sx volume overload now - continue coreg; no ACEI given borderline BP   End stage renal disease ([place]) - HD on M-W-F - Nephrology following for dialysis.    PAF (paroxysmal atrial fibrillation) (HCC) - AV paced on telemetry; continue amiodarone; coumadin resumed.     CAD - continue plavix and statin   Acute delerium - Improved    Previous ICD Awaiting SNF placement. FU Dr Gwenlyn Found  Kirk Ruths, MD, [facility] 01/02/2016 11:48 AM

## 2016-01-03 LAB — PROTIME-INR
INR: 1.71 — ABNORMAL HIGH (ref 0.00–1.49)
PROTHROMBIN TIME: 20 s — AB (ref 11.6–15.2)

## 2016-01-03 LAB — CBC
HCT: 36.1 % — ABNORMAL LOW (ref 39.0–52.0)
Hemoglobin: 11 g/dL — ABNORMAL LOW (ref 13.0–17.0)
MCH: 25.8 pg — ABNORMAL LOW (ref 26.0–34.0)
MCHC: 30.5 g/dL (ref 30.0–36.0)
MCV: 84.5 fL (ref 78.0–100.0)
PLATELETS: 170 10*3/uL (ref 150–400)
RBC: 4.27 MIL/uL (ref 4.22–5.81)
RDW: 22 % — AB (ref 11.5–15.5)
WBC: 8.3 10*3/uL (ref 4.0–10.5)

## 2016-01-03 LAB — HEPARIN LEVEL (UNFRACTIONATED): HEPARIN UNFRACTIONATED: 0.71 [IU]/mL — AB (ref 0.30–0.70)

## 2016-01-03 MED ORDER — WARFARIN SODIUM 4 MG PO TABS
4.0000 mg | ORAL_TABLET | Freq: Once | ORAL | Status: AC
  Administered 2016-01-03: 4 mg via ORAL
  Filled 2016-01-03: qty 1

## 2016-01-03 NOTE — Progress Notes (Addendum)
Patient sitting in the chair. Tolerating well. Will continue monitor.  Patient sat in the chair for 3.5 hours. Tolerated it well.

## 2016-01-03 NOTE — Progress Notes (Signed)
ANTICOAGULATION CONSULT NOTE - Follow Up Consult  Pharmacy Consult for heparin Indication: atrial fibrillation  Labs:  Recent Labs  12/31/15 0854 12/31/15 1053  01/01/16 0259 01/02/16 0315 01/03/16 0416  HGB  --   --   < > 11.5* 10.9* 11.0*  HCT  --   --   --  36.4* 35.1* 36.1*  PLT  --   --   --  180 168 170  LABPROT  --  19.1*  --   --  19.1* 20.0*  INR  --  1.60*  --   --  1.60* 1.71*  HEPARINUNFRC  --   --   --  0.52 0.56 0.71*  CREATININE 5.10*  --   --   --   --   --   < > = values in this interval not displayed.    Assessment: 82yo male now slightly supratherapeutic on heparin after several levels at goal.  Goal of Therapy:  Heparin level 0.3-0.7 units/ml   Plan:  Will decrease heparin gtt slightly to 1600 units/hr and check level in [place], PharmD, BCPS  01/03/2016,5:20 AM

## 2016-01-03 NOTE — Progress Notes (Signed)
Primary cardiologist: Dr. Quay Burow  Seen for followup: PAD with left leg ischemia  Subjective:    Feels weak. Not able to stand and high fall risk per PT note. No chest pain. Recent cough, nonproductive.  Objective:   Temp:  [97.5 F (36.4 C)-98.4 F (36.9 C)] 97.8 F (36.6 C) (03/11 0333) Pulse Rate:  [69-70] 69 (03/11 0822) Resp:  [16-19] 18 (03/11 0333) BP: (99-108)/(39-47) 100/42 mmHg (03/11 0822) SpO2:  [98 %-99 %] 98 % (03/11 0333) Weight:  [130 lb 15.3 oz (59.4 kg)-146 lb 6.2 oz (66.4 kg)] 146 lb 6.2 oz (66.4 kg) (03/11 0333) Last BM Date: 01/01/16  Filed Weights   01/02/16 0639 01/02/16 1026 01/03/16 0333  Weight: 130 lb 11.7 oz (59.3 kg) 130 lb 15.3 oz (59.4 kg) 146 lb 6.2 oz (66.4 kg)    Intake/Output Summary (Last 24 hours) at 01/03/16 1020 Last data filed at 01/02/16 1026  Gross per 24 hour  Intake      0 ml  Output      0 ml  Net      0 ml    Telemetry: Paced rhythm.  Exam:   General: Frail-appearing elderly male in no distress.  Lungs: Decreased breath sounds with rhonchi.  Cardiac: RRR without gallop.  Abdomen: NABS.  Extremities: Left foot dressed.  Lab Results:  Basic Metabolic Panel:  Recent Labs Lab 12/29/15 0509 12/30/15 0859 12/31/15 0854  NA 140 138 138  K 3.5 4.5 4.1  CL 99* 98* 100*  CO2 28 27 29   GLUCOSE 82 59* 99  BUN 32* 17 26*  CREATININE 5.85* 3.61* 5.10*  CALCIUM 8.6* 8.6* 8.7*    Liver Function Tests:  Recent Labs Lab 12/30/15 0859 12/31/15 0854  ALBUMIN 2.1* 1.8*    CBC:  Recent Labs Lab 01/01/16 0259 01/02/16 0315 01/03/16 0416  WBC 8.1 8.7 8.3  HGB 11.5* 10.9* 11.0*  HCT 36.4* 35.1* 36.1*  MCV 85.6 84.4 84.5  PLT 180 168 170    Coagulation:  Recent Labs Lab 12/31/15 1053 01/02/16 0315 01/03/16 0416  INR 1.60* 1.60* 1.71*    Head CT 12/29/2015: FINDINGS: Moderate generalized atrophy and white matter disease is stable. No acute cortical infarct, hemorrhage, or mass lesion is  evident. The ventricles are proportionate to the degree of atrophy. No significant extra-axial fluid collection is present.  Atherosclerotic calcifications are present in the cavernous internal carotid arteries, extending to the proximal right M1 segment. Atherosclerotic calcifications are noted at the dural margin of both vertebral arteries as well. The calvarium is intact. The paranasal sinuses and mastoid air cells are clear. No significant extracranial soft tissue lesions are evident.  IMPRESSION: 1. Stable atrophy and white matter disease. This likely reflects the sequela of chronic microvascular ischemia. 2. Atherosclerosis. 3. No acute intracranial abnormality or significant interval change.   Medications:   Scheduled Medications: . allopurinol  100 mg Oral Daily  . amiodarone  200 mg Oral Daily  . atorvastatin  40 mg Oral q1800  . carvedilol  3.125 mg Oral BID WC  . clopidogrel  75 mg Oral Q breakfast  . collagenase  1 application Topical Daily  . doxercalciferol  5 mcg Intravenous Q M,W,F-HD  . DULoxetine  30 mg Oral Daily  . feeding supplement (NEPRO CARB STEADY)  237 mL Oral BID BM  . feeding supplement (PRO-STAT SUGAR FREE 64)  30 mL Oral BID  . midodrine  10 mg Oral Q M,W,F  . multivitamin with minerals  1 tablet Oral Daily  . sodium chloride flush  3 mL Intravenous Q12H  . Warfarin - Pharmacist Dosing Inpatient   Does not apply q1800     Infusions: . heparin 1,600 Units/hr (01/03/16 0627)     PRN Medications:  sodium chloride, acetaminophen **OR** acetaminophen, camphor-menthol **AND** hydrOXYzine, docusate sodium, feeding supplement (NEPRO CARB STEADY), nitroGLYCERIN, ondansetron **OR** ondansetron (ZOFRAN) IV, sodium chloride flush, sorbitol, zolpidem   Assessment:   1. Patient with critical left leg ischemia with occluded peroneal stent which was only single-vessel runoff to the foot. He has left foot wound which is dressed with wound care in place.  No plan for repeat percutaneous intervention per Dr. Gwenlyn Found. May ultimately require amputation.  2. PAF, transitioning back on Coumadin with heparin bridge. INR subtherapeutic at 1.7.  3. End-stage renal disease on hemodialysis.  4. History of delirium, generally improved. Recent head CT without acute findings.  5. Ischemic cardiomyopathy with biventricular ICD in place.   Plan/Discussion:    Patient with significant deconditioning and limited functional status. Needs to be able to sit up for hemodialysis as an outpatient. Working with PT. Continue heparin bridge aiming for therapeutic INR prior to discharge on Coumadin. Remaining medications including amiodarone, Coreg, Lipitor, Plavix, and midodrine.   Satira Sark, M.D., F.A.C.C.

## 2016-01-03 NOTE — Discharge Instructions (Signed)

## 2016-01-03 NOTE — Progress Notes (Signed)
ANTICOAGULATION CONSULT NOTE - Follow Up Consult  Pharmacy Consult for heparin Indication: atrial fibrillation  Labs:  Recent Labs  12/31/15 1053  01/01/16 0259 01/02/16 0315 01/03/16 0416  HGB  --   < > 11.5* 10.9* 11.0*  HCT  --   --  36.4* 35.1* 36.1*  PLT  --   --  180 168 170  LABPROT 19.1*  --   --  19.1* 20.0*  INR 1.60*  --   --  1.60* 1.71*  HEPARINUNFRC  --   --  0.52 0.56 0.71*  < > = values in this interval not displayed.    Assessment: 85yom on coumadin pta for afib, admitted with suspected peroneal artery stent occlusion. Coumadin held and he was started on IV heparin 3/4. Pharmacy consulted to restart warfarin on 3/9. PV angio study on Monday was cancelled and is rescheduled for 3/8 - patient refused. May need BKA in future.  HL just above goal this am and rate adjusted down. INR trending up to 1.7, no overt bleeding issues noted, cbc stable.  Goal of Therapy:  Heparin level 0.3-0.7 units/ml  INR goal 2-3   Plan:  Continue heparin at 1600/hr - recheck level daily Warfarin 4mg  tonight - INR in am  Erin Hearing PharmD., BCPS Clinical Pharmacist Pager [phone] 01/03/2016 10:47 AM

## 2016-01-03 NOTE — Progress Notes (Signed)
[name] Progress Note  Dialysis Orders: [facility] MWF 4h 66.5kg 2/2.25 bath LUA AVF Hep none Mircera: 200 mcg IV Q 2 weeks ( last dose 12/24/15 HGB 10.6) Venofer: 50 mg IV weekly (last dose 12/24/15) Hectoral: 5 mcg IV q treatment  Assessment: 1. Ischemic L foot/ heel ulcer - per cardiology, ulcer may not heal. Plan resume coumadin, look for SNF, ultimately will likely require amputation.  2. PAF: Per cards-coumadin restarted, also on heparin/amio/coreg 3. ESRD - MWF HD K 4.1; chronic left arm swelling per pt - last AF 500s x 2 months- trending down, kinetics ok, last intervention [id number] - [id number] cephalic vein stenosis - baseline AFs 700 -800 - watch - needs follow up after discharge 4. Hypotension chronic - on midodrine. BP slightly soft, controlled. On midodrine 10 mg PO prior to HD MWF 5. Anemia - HGB 10.4 rec'd ESA dose 12/24/15 - due for redose 3/15 Follow HGB 6. Metabolic bone disease - Ca 8.4 No binders, continue hectoral  7. Nutrition - Renal diet, renal vitamin alb 1.8 -  8. AMS - underlying dementia per neurology. B12 and tsh normal.  9. Volume - under dry weight, no edema 10. Dispo- should be able to sit up in a chair, will ask RN to get in chair today and also w HD on Monday.    Plan - HD Monday in a chair. DC back to SNF if he can sit in a chair for 4 hrs   Kelly Splinter MD [place] pager [phone]    cell [phone] 01/03/2016, 11:07 AM    Subjective: complaining about HD taking so long  Objective Filed Vitals:   01/02/16 1414 01/02/16 2047 01/03/16 0333 01/03/16 0822  BP: 108/47 105/39 102/42 100/42  Pulse: 69 70 70 69  Temp: 98.4 F (36.9 C) 97.5 F (36.4 C) 97.8 F (36.6 C)   TempSrc: Oral Oral Oral   Resp: 18 16 18    Weight:   66.4 kg (146 lb 6.2 oz)   SpO2: 98% 99% 98%    Physical Exam General: chronically ill Heart: RRR Lungs: no rales Abdomen: soft NT Extremities: left foot in boot - no sig LE edema,  chronic left arm edema (last intervention 05/2015) Dialysis Access: left upper AVF   Dialysis Orders: [facility] MWF 4h 66.5kg 2/2.25 bath LUA AVF Hep none Mircera: 200 mcg IV Q 2 weeks ( last dose 12/24/15 HGB 10.6) Venofer: 50 mg IV weekly (last dose 12/24/15) Hectoral: 5 mcg IV q treatment   Additional Objective Labs: Lab Results  Component Value Date   INR 1.71* 01/03/2016   INR 1.60* 01/02/2016   INR 1.60* [id number]    Basic Metabolic Panel:  Recent Labs Lab 12/29/15 0509 12/30/15 0859 12/31/15 0854  NA 140 138 138  K 3.5 4.5 4.1  CL 99* 98* 100*  CO2 28 27 29   GLUCOSE 82 59* 99  BUN 32* 17 26*  CREATININE 5.85* 3.61* 5.10*  CALCIUM 8.6* 8.6* 8.7*  PHOS  --  2.6 3.1   Liver Function Tests:  Recent Labs Lab 12/30/15 0859 12/31/15 0854  ALBUMIN 2.1* 1.8*  CBC:  Recent Labs Lab 12/30/15 0859 12/31/15 0250 01/01/16 0259 01/02/16 0315 01/03/16 0416  WBC 11.4* 9.1 8.1 8.7 8.3  HGB 11.8* 10.4* 11.5* 10.9* 11.0*  HCT 36.7* 33.0* 36.4* 35.1* 36.1*  MCV 82.8 84.0 85.6 84.4 84.5  PLT 234 204 180 168 170   Studies/Results: No results found. Medications: . heparin 1,600 Units/hr (01/03/16 [id number])   .  allopurinol  100 mg Oral Daily  . amiodarone  200 mg Oral Daily  . atorvastatin  40 mg Oral q1800  . carvedilol  3.125 mg Oral BID WC  . clopidogrel  75 mg Oral Q breakfast  . collagenase  1 application Topical Daily  . doxercalciferol  5 mcg Intravenous Q M,W,F-HD  . DULoxetine  30 mg Oral Daily  . feeding supplement (NEPRO CARB STEADY)  237 mL Oral BID BM  . feeding supplement (PRO-STAT SUGAR FREE 64)  30 mL Oral BID  . midodrine  10 mg Oral Q M,W,F  . multivitamin with minerals  1 tablet Oral Daily  . sodium chloride flush  3 mL Intravenous Q12H  . warfarin  4 mg Oral ONCE-1800  . Warfarin - Pharmacist Dosing Inpatient   Does not apply [phone]

## 2016-01-04 LAB — CBC
HCT: 36.1 % — ABNORMAL LOW (ref 39.0–52.0)
Hemoglobin: 11.2 g/dL — ABNORMAL LOW (ref 13.0–17.0)
MCH: 26.4 pg (ref 26.0–34.0)
MCHC: 31 g/dL (ref 30.0–36.0)
MCV: 85.1 fL (ref 78.0–100.0)
PLATELETS: 163 10*3/uL (ref 150–400)
RBC: 4.24 MIL/uL (ref 4.22–5.81)
RDW: 22.1 % — ABNORMAL HIGH (ref 11.5–15.5)
WBC: 7.3 10*3/uL (ref 4.0–10.5)

## 2016-01-04 LAB — HEPARIN LEVEL (UNFRACTIONATED): Heparin Unfractionated: 0.67 IU/mL (ref 0.30–0.70)

## 2016-01-04 LAB — PROTIME-INR
INR: 2.26 — ABNORMAL HIGH (ref 0.00–1.49)
PROTHROMBIN TIME: 24.7 s — AB (ref 11.6–15.2)

## 2016-01-04 MED ORDER — WARFARIN SODIUM 3 MG PO TABS
1.5000 mg | ORAL_TABLET | Freq: Once | ORAL | Status: AC
  Administered 2016-01-04: 1.5 mg via ORAL
  Filled 2016-01-04: qty 2

## 2016-01-04 NOTE — Progress Notes (Signed)
Patient sat up in the chair for 4 hours. Tolerated it well.

## 2016-01-04 NOTE — Progress Notes (Addendum)
    Subjective:  Looks weak, no specific complaints  Objective:  Vital Signs in the last 24 hours: Temp:  [97.7 F (36.5 C)-98.1 F (36.7 C)] 98.1 F (36.7 C) (03/12 0542) Pulse Rate:  [69-71] 71 (03/12 0838) Resp:  [16] 16 (03/12 0542) BP: (94-113)/(39-50) 94/39 mmHg (03/12 0838) SpO2:  [98 %-100 %] 98 % (03/12 0542) Weight:  [146 lb 2.6 oz (66.3 kg)] 146 lb 2.6 oz (66.3 kg) (03/12 0542)  Intake/Output from previous day:  Intake/Output Summary (Last 24 hours) at 01/04/16 0850 Last data filed at 01/03/16 1100  Gross per 24 hour  Intake      0 ml  Output      0 ml  Net      0 ml    Physical Exam: General appearance: alert, cooperative, cachectic and no distress Lungs: decreased breath sounds Heart: regular rate and rhythm Extremities: LLE in boot and dressed   Rate: 72  Rhythm: paced  Lab Results:  Recent Labs  01/03/16 0416 01/04/16 0442  WBC 8.3 7.3  HGB 11.0* 11.2*  PLT 170 163    Recent Labs  01/04/16 0442  INR 2.26*    Scheduled Meds: . allopurinol  100 mg Oral Daily  . amiodarone  200 mg Oral Daily  . atorvastatin  40 mg Oral q1800  . carvedilol  3.125 mg Oral BID WC  . clopidogrel  75 mg Oral Q breakfast  . collagenase  1 application Topical Daily  . doxercalciferol  5 mcg Intravenous Q M,W,F-HD  . DULoxetine  30 mg Oral Daily  . feeding supplement (NEPRO CARB STEADY)  237 mL Oral BID BM  . feeding supplement (PRO-STAT SUGAR FREE 64)  30 mL Oral BID  . midodrine  10 mg Oral Q M,W,F  . multivitamin with minerals  1 tablet Oral Daily  . sodium chloride flush  3 mL Intravenous Q12H  . Warfarin - Pharmacist Dosing Inpatient   Does not apply q1800   Continuous Infusions: . heparin 1,600 Units/hr (01/03/16 1849)   PRN Meds:.sodium chloride, acetaminophen **OR** acetaminophen, camphor-menthol **AND** hydrOXYzine, docusate sodium, feeding supplement (NEPRO CARB STEADY), nitroGLYCERIN, ondansetron **OR** ondansetron (ZOFRAN) IV, sodium chloride  flush, sorbitol, zolpidem   Imaging: Imaging results have been reviewed.  Cardiac Studies:  Assessment/Plan:   Principal Problem:   Critical lower limb ischemia Active Problems:   Chronic systolic heart failure (HCC)   End stage renal disease (HCC)   PAD (peripheral artery disease) (HCC)   PAF (paroxysmal atrial fibrillation) (HCC)   Essential hypertension   Acute encephalopathy   PLAN: Can probably stop Heparin today. MD to see.   Kerin Ransom PA-C 01/04/2016, 8:50 AM [phone]  Attending note:  Patient seen and examined. Discussed with Mr. Reino Bellis. Overall no change in clinical status. Still needs to work on deconditioning with PT, but was able to sit up some yesterday in chair. INR today is therapeutic at 2.2 so will stop heparin. It is planned that he will undergo hemodialysis tomorrow and sit in chair for session which should be about 4 hours. If this is successful, can work back toward discharge to SNF.  Satira Sark, M.D., F.A.C.C.

## 2016-01-04 NOTE — Progress Notes (Signed)
ANTICOAGULATION CONSULT NOTE - Follow Up Consult  Pharmacy Consult for heparin>>warfarin Indication: atrial fibrillation  Labs:  Recent Labs  01/02/16 0315 01/03/16 0416 01/04/16 0442  HGB 10.9* 11.0* 11.2*  HCT 35.1* 36.1* 36.1*  PLT 168 170 163  LABPROT 19.1* 20.0* 24.7*  INR 1.60* 1.71* 2.26*  HEPARINUNFRC 0.56 0.71* 0.67      Assessment: 85yom on coumadin pta for afib, admitted with suspected peroneal artery stent occlusion. Coumadin held and he was started on IV heparin 3/4. Pharmacy consulted to restart warfarin on 3/9. PV angio study on Monday was cancelled and is rescheduled for 3/8 - patient refused. May need BKA in future.  HL at goal this am and INR now at goal 2.2 orders to stop heparin. No overt bleeding issues noted, cbc stable.  PTA warfarin dose is 3mg  daily except 1.5mg  on sundays  Goal of Therapy:  Heparin level 0.3-0.7 units/ml  INR goal 2-3   Plan:  D/c heparin Warfarin 1.5mg  tonight  Erin Hearing PharmD., BCPS Clinical Pharmacist Pager [phone] 01/04/2016 11:01 AM

## 2016-01-04 NOTE — Progress Notes (Signed)
[facility] Progress Note  Dialysis Orders: NW MWF 4h 66.5kg 2/2.25 bath LUA AVF Hep none Mircera: 200 mcg IV Q 2 weeks ( last dose 12/24/15 HGB 10.6) Venofer: 50 mg IV weekly (last dose 12/24/15) Hectoral: 5 mcg IV q treatment  Assessment: 1. Ischemic L foot/ heel ulcer - peroneal art stent occluded. No further intervention planned, high risk will need amputation in the future. When able to sit in chair for HD can pursue SNFP per primary 2. PAF: Per cards-coumadin restarted, also on heparin/amio/coreg 3. ESRD - MWF HD K 4.1; chronic left arm swelling per pt - last AF 500s x 2 months- trending down, kinetics ok, last intervention [id number] - [id number] cephalic vein stenosis - baseline AFs 700 -800 - watch - needs follow up after discharge 4. Hypotension chronic - on midodrine 10 mg PO prior to HD MWF 5. Anemia - HGB 10.4 rec'd ESA dose 12/24/15 - due for redose 3/15 Follow HGB 6. Metabolic bone disease - Ca 8.4 No binders, continue hectoral  7. Nutrition - Renal diet, renal vitamin alb 1.8 -  8. AMS - underlying dementia per neurology. B12 and tsh normal.  9. Volume - at dry wt, no edema 10. Dispo- to SNF when can sit in chair   Plan - HD Monday first shift   Kelly Splinter MD [place] pager [phone]    cell [phone] 01/04/2016, 3:15 PM    Subjective: up in chair  Objective Filed Vitals:   01/03/16 2036 01/04/16 0542 01/04/16 0838 01/04/16 1332  BP: 103/45 113/50 94/39 102/46  Pulse: 70 70 71 71  Temp: 98.1 F (36.7 C) 98.1 F (36.7 C)  98.2 F (36.8 C)  TempSrc: Oral Oral  Oral  Resp: 16 16  16   Weight:  66.3 kg (146 lb 2.6 oz)    SpO2: 100% 98%  100%   Physical Exam General: chronically ill Heart: RRR Lungs: no rales Abdomen: soft NT Extremities: left foot in boot - no sig LE edema, chronic left arm edema (last intervention 05/2015) Dialysis Access: left upper AVF   Dialysis Orders: NW MWF 4h 66.5kg 2/2.25 bath LUA AVF  Hep none Mircera: 200 mcg IV Q 2 weeks ( last dose 12/24/15 HGB 10.6) Venofer: 50 mg IV weekly (last dose 12/24/15) Hectoral: 5 mcg IV q treatment   Additional Objective Labs: Lab Results  Component Value Date   INR 2.26* 01/04/2016   INR 1.71* 01/03/2016   INR 1.60* [id number]    Basic Metabolic Panel:  Recent Labs Lab 12/29/15 0509 12/30/15 0859 12/31/15 0854  NA 140 138 138  K 3.5 4.5 4.1  CL 99* 98* 100*  CO2 28 27 29   GLUCOSE 82 59* 99  BUN 32* 17 26*  CREATININE 5.85* 3.61* 5.10*  CALCIUM 8.6* 8.6* 8.7*  PHOS  --  2.6 3.1   Liver Function Tests:  Recent Labs Lab 12/30/15 0859 12/31/15 0854  ALBUMIN 2.1* 1.8*  CBC:  Recent Labs Lab 12/31/15 0250 01/01/16 0259 01/02/16 0315 01/03/16 0416 01/04/16 0442  WBC 9.1 8.1 8.7 8.3 7.3  HGB 10.4* 11.5* 10.9* 11.0* 11.2*  HCT 33.0* 36.4* 35.1* 36.1* 36.1*  MCV 84.0 85.6 84.4 84.5 85.1  PLT 204 180 168 170 163   Studies/Results: No results found. Medications:   . allopurinol  100 mg Oral Daily  . amiodarone  200 mg Oral Daily  . atorvastatin  40 mg Oral q1800  . carvedilol  3.125 mg Oral BID WC  . clopidogrel  75 mg Oral Q breakfast  . collagenase  1 application Topical Daily  . doxercalciferol  5 mcg Intravenous Q M,W,F-HD  . DULoxetine  30 mg Oral Daily  . feeding supplement (NEPRO CARB STEADY)  237 mL Oral BID BM  . feeding supplement (PRO-STAT SUGAR FREE 64)  30 mL Oral BID  . midodrine  10 mg Oral Q M,W,F  . multivitamin with minerals  1 tablet Oral Daily  . sodium chloride flush  3 mL Intravenous Q12H  . warfarin  1.5 mg Oral ONCE-1800  . Warfarin - Pharmacist Dosing Inpatient   Does not apply [phone]

## 2016-01-05 LAB — BASIC METABOLIC PANEL
ANION GAP: 11 (ref 5–15)
BUN: 34 mg/dL — AB (ref 6–20)
CO2: 28 mmol/L (ref 22–32)
Calcium: 9.5 mg/dL (ref 8.9–10.3)
Chloride: 98 mmol/L — ABNORMAL LOW (ref 101–111)
Creatinine, Ser: 5.6 mg/dL — ABNORMAL HIGH (ref 0.61–1.24)
GFR calc Af Amer: 10 mL/min — ABNORMAL LOW (ref >60)
GFR, EST NON AFRICAN AMERICAN: 8 mL/min — AB (ref >60)
Glucose, Bld: 98 mg/dL (ref 65–99)
POTASSIUM: 3.6 mmol/L (ref 3.5–5.1)
SODIUM: 137 mmol/L (ref 135–145)

## 2016-01-05 LAB — CBC
HEMATOCRIT: 35.6 % — AB (ref 39.0–52.0)
HEMOGLOBIN: 11 g/dL — AB (ref 13.0–17.0)
MCH: 26.4 pg (ref 26.0–34.0)
MCHC: 30.9 g/dL (ref 30.0–36.0)
MCV: 85.6 fL (ref 78.0–100.0)
Platelets: 171 10*3/uL (ref 150–400)
RBC: 4.16 MIL/uL — ABNORMAL LOW (ref 4.22–5.81)
RDW: 21.8 % — AB (ref 11.5–15.5)
WBC: 6.4 10*3/uL (ref 4.0–10.5)

## 2016-01-05 LAB — PROTIME-INR
INR: 2.24 — ABNORMAL HIGH (ref 0.00–1.49)
Prothrombin Time: 24.6 seconds — ABNORMAL HIGH (ref 11.6–15.2)

## 2016-01-05 MED ORDER — LIDOCAINE HCL (PF) 1 % IJ SOLN: 5.0000 mL | INTRAMUSCULAR | Status: DC | PRN

## 2016-01-05 MED ORDER — DOXERCALCIFEROL 4 MCG/2ML IV SOLN
INTRAVENOUS | Status: AC
  Filled 2016-01-05: qty 4

## 2016-01-05 MED ORDER — MIDODRINE HCL 5 MG PO TABS
ORAL_TABLET | ORAL | Status: AC
  Filled 2016-01-05: qty 2

## 2016-01-05 MED ORDER — PENTAFLUOROPROP-TETRAFLUOROETH EX AERO: 1.0000 "application " | INHALATION_SPRAY | CUTANEOUS | Status: DC | PRN

## 2016-01-05 MED ORDER — HEPARIN SODIUM (PORCINE) 1000 UNIT/ML DIALYSIS: 1000.0000 [IU] | INTRAMUSCULAR | Status: DC | PRN

## 2016-01-05 MED ORDER — LIDOCAINE-PRILOCAINE 2.5-2.5 % EX CREA
1.0000 "application " | TOPICAL_CREAM | CUTANEOUS | Status: DC | PRN
  Filled 2016-01-05: qty 5

## 2016-01-05 MED ORDER — SODIUM CHLORIDE 0.9 % IV SOLN: 100.0000 mL | INTRAVENOUS | Status: DC | PRN

## 2016-01-05 MED ORDER — ALTEPLASE 2 MG IJ SOLR
2.0000 mg | Freq: Once | INTRAMUSCULAR | Status: DC | PRN
  Filled 2016-01-05: qty 2

## 2016-01-05 MED ORDER — WARFARIN SODIUM 3 MG PO TABS
3.0000 mg | ORAL_TABLET | Freq: Once | ORAL | Status: DC
  Filled 2016-01-05: qty 1

## 2016-01-05 NOTE — Discharge Summary (Signed)
Discharge Summary    Patient ID: [name],  MRN: [id number], DOB/AGE: 80-Sep-1931 80 y.o.  Admit date: 12/26/2015 Discharge date: 01/05/2016  Primary Care Provider: Regan Lemming R  Primary Cardiologist: Dr. Terrence Dupont. PV MD: Dr. Gwenlyn Found Electrophysiologist: Dr. Lovena Le  Discharge Diagnoses    Principal Problem:   Critical lower limb ischemia Active Problems:   Chronic systolic heart failure (HCC)   End stage renal disease (HCC)   PAD (peripheral artery disease) (HCC)   PAF (paroxysmal atrial fibrillation) ([place])   Essential hypertension   Acute encephalopathy   Allergies No Known Allergies  Diagnostic Studies/Procedures     History of Present Illness     [name] is a 82 y/o male, with h/o ischemic cardiomyopathy s/p AICD, followed by Dr. Lovena Le. He also has a h/o chronic atrial fibrillation, on chronic coumadin and h/o ESRD on HD. He was recently referred to Dr. Gwenlyn Found by Dr. Earleen Newport, his podiatrist, for critical limb ischemia. He has an ulcer on his left heel which has been present for the last month. There is no history of claudication. He had Dopplers in our office 12/05/15 revealing occluded tibials bilaterally.   He was initially admitted to [facility] on 12/15/15 for planned PV angio +/- intervention. He underwent PTA + stenting of the L proximal peroneal artery. Postprocedure, he was placed on Coumadin and Plavix without aspirin to lessen the bleeding risk. Nephrology was consulted to help manage his dialysis. He was discharged home in stable condition on 12/26/15 to SNF.   He was seen back in clinic for f/u lower arterial dopplers + ABIs 12/25/15. This showed an occluded stent. He was readmitted 12/26/15 to transition off Coumadin in preparation for another attempt at revascularization.  Hospital Course      He was admitted to telemetry. His coumadin was held and IV heparin was started once INR was <2.0. PV angio was delayed further as patient developed acute  delirium/confusion. Neurology was consulted. EEG obtained at time of confusion was negative. CT was negative. It was felt to be acute delirium/ sundowning/ probable component of dementia. Nephrology cleared him for repeat PV angio, which was rescheduled 12/31/15. This was scheduled with Dr. Fletcher Anon, however it was noted that the patient, "communicated that he did not want to have the procedure done". Dr. Fletcher Anon discussed the procedure with him and the indications in detail. He said that he was "tired of having procedures done and wants to be left alone". It was explained to him the risk of amputation. He mentioned that he understood and he was okay with that (see Dr. Tyrell Antonio Progress Note from 12/31/15 outlining conversation). It was also felt by Dr. Fletcher Anon and Dr. Gwenlyn Found that his leg might not be salvageable regardless of intervention. It was felt that BKA might be the right option for him if his leg does not heal. His procedure was canceled. Continued wound care was recommended. The wound care team assisted with management. He was restarted on Coumadin and bridged with heparin until INR was therapeutic. INR day of discharge was 2.24. Nephrology was also on board assisting with HD during his hospitalization. He completed HD prior to discharge. Case management assisted with SNF placement. He was last seen and examined by Dr. Sallyanne Kuster who determined he was stable for discharge. He will follow back up with Dr. Earleen Newport, his podiatrist, for other options. He may need amputation.    Consultants: nephrology, neurology.     Discharge Vitals Blood pressure 100/44, pulse 70, temperature  97.9 F (36.6 C), temperature source Oral, resp. rate 18, weight 147 lb 4.3 oz (66.8 kg), SpO2 98 %.  Filed Weights   01/05/16 0504 01/05/16 0849 01/05/16 1219  Weight: 147 lb 4.3 oz (66.8 kg) 147 lb 4.3 oz (66.8 kg) 147 lb 4.3 oz (66.8 kg)    Labs & Radiologic Studies     CBC  Recent Labs  01/04/16 0442 01/05/16 0310  WBC 7.3  6.4  HGB 11.2* 11.0*  HCT 36.1* 35.6*  MCV 85.1 85.6  PLT 163 [id number]   Basic Metabolic Panel  Recent Labs  01/05/16 0310  NA 137  K 3.6  CL 98*  CO2 28  GLUCOSE 98  BUN 34*  CREATININE 5.60*  CALCIUM 9.5   Liver Function Tests No results for input(s): AST, ALT, ALKPHOS, BILITOT, PROT, ALBUMIN in the last 72 hours. No results for input(s): LIPASE, AMYLASE in the last 72 hours. Cardiac Enzymes No results for input(s): CKTOTAL, CKMB, CKMBINDEX, TROPONINI in the last 72 hours. BNP Invalid input(s): POCBNP D-Dimer No results for input(s): DDIMER in the last 72 hours. Hemoglobin A1C No results for input(s): HGBA1C in the last 72 hours. Fasting Lipid Panel No results for input(s): CHOL, HDL, LDLCALC, TRIG, CHOLHDL, LDLDIRECT in the last 72 hours. Thyroid Function Tests No results for input(s): TSH, T4TOTAL, T3FREE, THYROIDAB in the last 72 hours.  Invalid input(s): FREET3  Ct Head Wo Contrast  12/29/2015  CLINICAL DATA:  Recent CVA.  Confusion and cognitive decline. EXAM: CT HEAD WITHOUT CONTRAST TECHNIQUE: Contiguous axial images were obtained from the base of the skull through the vertex without intravenous contrast. COMPARISON:  CT head without contrast 11/25/2015. FINDINGS: Moderate generalized atrophy and white matter disease is stable. No acute cortical infarct, hemorrhage, or mass lesion is evident. The ventricles are proportionate to the degree of atrophy. No significant extra-axial fluid collection is present. Atherosclerotic calcifications are present in the cavernous internal carotid arteries, extending to the proximal right M1 segment. Atherosclerotic calcifications are noted at the dural margin of both vertebral arteries as well. The calvarium is intact. The paranasal sinuses and mastoid air cells are clear. No significant extracranial soft tissue lesions are evident. IMPRESSION: 1. Stable atrophy and white matter disease. This likely reflects the sequela of chronic  microvascular ischemia. 2. Atherosclerosis. 3. No acute intracranial abnormality or significant interval change. Electronically Signed   By: San Morelle M.D.   On: 12/29/2015 12:07    Disposition   Pt is being discharged home today in good condition.  Follow-up Plans & Appointments    Follow-up Information    Follow up with Celesta Gentile, DPM.   Specialty:  Podiatry   Why:  f/u with Dr. Jacqualyn Posey as soon as possible    Contact information:   [address] [place] [id number] [phone]      Discharge Instructions    Diet - low sodium heart healthy    Complete by:  As directed      Increase activity slowly    Complete by:  As directed            Discharge Medications   Current Discharge Medication List    CONTINUE these medications which have NOT CHANGED   Details  allopurinol (ZYLOPRIM) 100 MG tablet Take 100 mg by mouth daily.     amiodarone (PACERONE) 200 MG tablet Take 200 mg by mouth daily.     atorvastatin (LIPITOR) 40 MG tablet Take 40 mg by mouth daily at 6 PM.  carvedilol (COREG) 3.125 MG tablet Take 1 tablet (3.125 mg total) by mouth 2 (two) times daily with a meal. Qty: 60 tablet, Refills: 1    clopidogrel (PLAVIX) 75 MG tablet Take 1 tablet (75 mg total) by mouth daily with breakfast. Qty: 90 tablet, Refills: 3    collagenase (SANTYL) ointment Apply 1 application topically daily. Qty: 15 g, Refills: 0    DULoxetine (CYMBALTA) 30 MG capsule Take 30 mg by mouth daily.    ethyl chloride spray Apply 1 application topically as needed. (prior to dialysis) Refills: 12    midodrine (PROAMATINE) 10 MG tablet Take 10 mg by mouth every Monday, Wednesday, and Friday.  Refills: 5    Multiple Vitamin (MULTIVITAMIN WITH MINERALS) TABS Take 1 tablet by mouth daily. 50+    Multiple Vitamins-Minerals (DECUBI-VITE) CAPS Take 1 capsule by mouth daily.    Nutritional Supplements (FEEDING SUPPLEMENT, NEPRO CARB STEADY,) LIQD Take 237 mLs by  mouth 2 (two) times daily between meals. 12pm, 5pm    silver sulfADIAZINE (SILVADENE) 1 % cream Apply 1 application topically daily. Qty: 50 g, Refills: 3    warfarin (COUMADIN) 3 MG tablet Take 1.5-3 mg by mouth daily at 6 PM. Take 1/2 tablet (1.5 mg) by mouth on Sunday evening, take 1 tablet (3 mg) on all other days of the week Refills: 3   Associated Diagnoses: Paroxysmal atrial fibrillation (HCC)    diphenoxylate-atropine (LOMOTIL) 2.5-0.025 MG tablet 1 tablet once per day if greater than one episode of diarrhea. Qty: 10 tablet, Refills: 0         Outstanding Labs/Studies    Duration of Discharge Encounter   Greater than 30 minutes including physician time.  Alveta Heimlich, BRITTAINY NP 01/05/2016, 2:53 PM

## 2016-01-05 NOTE — Procedures (Signed)
On HD now .  Low BP and keeping even.  HD trial in a chair.  Dispo per primary team. [name]

## 2016-01-05 NOTE — Care Management Important Message (Signed)
Important Message  Patient Details  Name: [name] MRN: [id number] Date of Birth: 1929-11-12   Medicare Important Message Given:  Yes    Aeisha Minarik Abena 01/05/2016, 9:50 AM

## 2016-01-05 NOTE — Progress Notes (Signed)
Utilization review completed.  

## 2016-01-05 NOTE — Progress Notes (Signed)
Patient Name: [name] Date of Encounter: 01/05/2016    Primary Cardiologist: Dr. Terrence Dupont  Primary PV: Dr. Gwenlyn Found Primary EP: Dr. Lovena Le  Patient Profile: [name] is an 82 y.o. male with a history of CAD (ant-lat MI s/p PTCA/DES to LAD 2006), HTN, HLD, ESRD on HD, paroxysmal atrial fib on amiodarone/Coumadin (NSR by interrogation 10/2015), ICM/chronic systolic CHF (s/p ICD with upgrade to [employer] BiV-ICD 07/2015 when it had reached ERI), GIB 03/2015 in setting of elevated INR, WCT felt to be atrial tachycardia 06/2015, anemia of chronic disease, hypothyroidism who presented to [facility] initially for planned PV angio on 01/01/16. Dopplers performed on 12/05/15 revealed occluded tibials bilaterally.   SUBJECTIVE: Says he feels well, denies chest pain, palpitations and SOB.    OBJECTIVE Filed Vitals:   01/04/16 1616 01/04/16 2110 01/05/16 0502 01/05/16 0504  BP: 116/47 98/46 111/49   Pulse: 69 70 70   Temp:  98.1 F (36.7 C) 98.8 F (37.1 C)   TempSrc:  Oral Oral   Resp:  18 18   Weight:    147 lb 4.3 oz (66.8 kg)  SpO2:  98% 98%     Intake/Output Summary (Last 24 hours) at 01/05/16 0810 Last data filed at 01/04/16 1300  Gross per 24 hour  Intake    120 ml  Output      0 ml  Net    120 ml   Filed Weights   01/03/16 0333 01/04/16 0542 01/05/16 0504  Weight: 146 lb 6.2 oz (66.4 kg) 146 lb 2.6 oz (66.3 kg) 147 lb 4.3 oz (66.8 kg)    PHYSICAL EXAM General: Well developed, well nourished, male in no acute distress. Head: Normocephalic, atraumatic.  Neck: Supple without bruits, no JVD. Lungs:  Resp regular and unlabored, CTA. Heart: RRR, S1, S2, no S3, S4, No murmur; no rub. Abdomen: Soft, non-tender, non-distended, BS + x 4.  Extremities: Has Prevlon boot on his LLE, wound seen by wound care.  Cannot palpate pedal pulse on L.   Neuro: Alert and oriented X 3. Moves all extremities spontaneously. Psych: Normal affect.  LABS: CBC: Recent Labs  01/04/16 0442  01/05/16 0310  WBC 7.3 6.4  HGB 11.2* 11.0*  HCT 36.1* 35.6*  MCV 85.1 85.6  PLT 163 171   INR: Recent Labs  01/05/16 0310  INR [id number]*   Basic Metabolic Panel: Recent Labs  01/05/16 0310  NA 137  K 3.6  CL 98*  CO2 28  GLUCOSE 98  BUN 34*  CREATININE 5.60*  CALCIUM 9.5    TELE:   V paced.       Current Medications:  . allopurinol  100 mg Oral Daily  . amiodarone  200 mg Oral Daily  . atorvastatin  40 mg Oral q1800  . carvedilol  3.125 mg Oral BID WC  . clopidogrel  75 mg Oral Q breakfast  . collagenase  1 application Topical Daily  . doxercalciferol  5 mcg Intravenous Q M,W,F-HD  . DULoxetine  30 mg Oral Daily  . feeding supplement (NEPRO CARB STEADY)  237 mL Oral BID BM  . feeding supplement (PRO-STAT SUGAR FREE 64)  30 mL Oral BID  . midodrine  10 mg Oral Q M,W,F  . multivitamin with minerals  1 tablet Oral Daily  . sodium chloride flush  3 mL Intravenous Q12H  . Warfarin - Pharmacist Dosing Inpatient   Does not apply q1800      ASSESSMENT AND PLAN: Principal  Problem:   Critical lower limb ischemia Active Problems:   Chronic systolic heart failure (HCC)   End stage renal disease (HCC)   PAD (peripheral artery disease) (HCC)   PAF (paroxysmal atrial fibrillation) (HCC)   Essential hypertension   Acute encephalopathy  1. Critical lower limb ischemia - has occluded peroneal stent, no plan for repeat intervention per Dr. Gwenlyn Found.  May require amputation. - MD to advise re: follow up - has left foot ulcer seen by wound care and Dr. Jacqualyn Posey with podiatry.  Will need referral for surgery?   2. PAF - Has transitioned back to Coumadin. INR is therapeutic - On po Amio - NSR now  3. ICM - Has biventricular ICD  4. ESRD - Will get HD today then can transition back to SNF.   Signed, Arbutus Leas , AGNP-C 8:10 AM 01/05/2016   I have seen and examined the patient along with Arbutus Leas , NP.  I have reviewed the chart, notes and new data.  I agree with  NP's note.  Unfortunately, no good options for revascularization and may need amputation. INR 2.24. Plan return to SNF after HD today.    Sanda Klein, MD, [place] [phone] 01/05/2016, 11:55 AM

## 2016-01-05 NOTE — Care Management Note (Signed)
Case Management Note  Patient Details  Name: [name] MRN: [id number] Date of Birth: Jan 28, 1930  Subjective/Objective: 82 y.o. M admitted 12/26/2015 with Critical Lower limb Ischemia and nonhealing Ulcer on L heel.  Pt is s/p Stent placement  Left proximal  Perineal Artery which was suspicious for Occlusion. Hx CAD, ESRD on HD, Hx MI, PAF on Amiodarone and Coumadin. S/p ICD                    Action/Plan: While hospitalized pt refused Angiogram x2, (AMS)  despite warnings that the end result may be loss of limb. Coumadin restarted 01/01/2016 and pt will be ready for SNF when INR therapeutic.(probably Monday per MD).  CM made CSW aware and will continue to be available should disposition needs change.    Expected Discharge Date:    01/05/16              Expected Discharge Plan:  Skilled Nursing Facility  In-House Referral:  Clinical Social Work  Discharge planning Services  CM Consult  Post Acute Care Choice:    Choice offered to:  Spouse  DME Arranged:    DME Agency:     HH Arranged:    HH Agency:     Status of Service:  Completed, signed off  Medicare Important Message Given:  Yes Date Medicare IM Given:    Medicare IM give by:    Date Additional Medicare IM Given:    Additional Medicare Important Message give by:     If discussed at [place] of Stay Meetings, dates discussed:  01/01/2016  Discharge Disposition: Skilled facility   Additional Comments:  01/05/16- Marvetta Gibbons RN, BSN- pt having HD in chair this AM- to d/c to SNF after HD- CSW following for placement needs  Dawayne Patricia, RN 01/05/2016, 3:26 PM

## 2016-01-05 NOTE — Progress Notes (Signed)
Patient will discharge to [facility] Anticipated discharge date: 3/13 Family notified: pt wife Transportation by [employer]- scheduled for 3:30pm  CSW signing off.  Domenica Reamer, [place] Social Worker [phone]

## 2016-01-05 NOTE — Progress Notes (Signed)
ANTICOAGULATION CONSULT NOTE - Follow Up Consult  Pharmacy Consult for Warfarin Indication: atrial fibrillation  No Known Allergies  Patient Measurements: Weight: 147 lb 4.3 oz (66.8 kg)  Vital Signs: Temp: 98.4 F (36.9 C) (03/13 0849) Temp Source: Oral (03/13 0849) BP: 109/49 mmHg (03/13 1030) Pulse Rate: 70 (03/13 1030)  Labs:  Recent Labs  01/03/16 0416 01/04/16 0442 01/05/16 0310  HGB 11.0* 11.2* 11.0*  HCT 36.1* 36.1* 35.6*  PLT 170 163 171  LABPROT 20.0* 24.7* 24.6*  INR 1.71* 2.26* 2.24*  HEPARINUNFRC 0.71* 0.67  --   CREATININE  --   --  5.60*    Estimated Creatinine Clearance: 9.4 mL/min (by C-G formula based on Cr of 5.6).   Assessment: 60 YOM on coumadin pta for afib, admitted with suspected peroneal artery stent occlusion. Coumadin held and he was started on IV heparin 3/4. Pharmacy consulted to restart warfarin on 3/9. PV angio study on Monday was cancelled and is rescheduled for 3/8 - patient refused. May need BKA in future. Heparin drip d/ced on 3/12 once INR was at goal.   INR remains therapeutic this morning (INR 2.24 << 2.26, goal of 2-3), CBC stable - no overt s/sx of bleeding noted.   PTA warfarin dose is 3mg  daily except 1.5mg  on sundays  Goal of Therapy:  INR 2-3   Plan:  1. Warfarin 3 mg x 1 dose at 1800 today 2. Will continue to monitor for any signs/symptoms of bleeding and will follow up with PT/INR in the a.m.   Alycia Rossetti, PharmD, BCPS Clinical Pharmacist Pager: [phone] 01/05/2016 10:56 AM

## 2016-01-05 NOTE — Progress Notes (Signed)
[facility] informed of possible DC today- they still anticipate they can take the patient today as long as pt tolerates dialysis while sitting  Pt wife informed of possible DC- she will got to facility at 1pm to complete admissions paperwork  CSW will continue to follow  Domenica Reamer, [place] Social Worker [phone]

## 2016-01-05 NOTE — Progress Notes (Signed)
Pt prepared for discharge to [facility].  New sacral foam dressing applied as well as new foam dressing to L heel.  Tele and IV removed, central monitoring notified.  Report called to [employer], Therapist, sports at [facility].  Social work has notified pt wife, transport expected to be here soon.  Pt belongings packed for transport.

## 2016-01-06 ENCOUNTER — Ambulatory Visit: Payer: Medicare Other | Admitting: Sports Medicine

## 2016-01-08 ENCOUNTER — Inpatient Hospital Stay (HOSPITAL_COMMUNITY)
Admission: EM | Admit: 2016-01-08 | Discharge: 2016-01-13 | DRG: 377 | Disposition: A | Discharge status: Skilled Nursing Facility | Payer: Medicare Other | Attending: Internal Medicine | Admitting: Internal Medicine

## 2016-01-08 DIAGNOSIS — Z955 Presence of coronary angioplasty implant and graft: Secondary | ICD-10-CM

## 2016-01-08 DIAGNOSIS — N186 End stage renal disease: Secondary | ICD-10-CM | POA: Diagnosis not present

## 2016-01-08 DIAGNOSIS — R791 Abnormal coagulation profile: Secondary | ICD-10-CM | POA: Diagnosis present

## 2016-01-08 DIAGNOSIS — I480 Paroxysmal atrial fibrillation: Secondary | ICD-10-CM | POA: Diagnosis not present

## 2016-01-08 DIAGNOSIS — Z8546 Personal history of malignant neoplasm of prostate: Secondary | ICD-10-CM

## 2016-01-08 DIAGNOSIS — I5022 Chronic systolic (congestive) heart failure: Secondary | ICD-10-CM | POA: Diagnosis present

## 2016-01-08 DIAGNOSIS — L8990 Pressure ulcer of unspecified site, unspecified stage: Secondary | ICD-10-CM | POA: Diagnosis present

## 2016-01-08 DIAGNOSIS — I10 Essential (primary) hypertension: Secondary | ICD-10-CM

## 2016-01-08 DIAGNOSIS — K210 Gastro-esophageal reflux disease with esophagitis: Secondary | ICD-10-CM | POA: Diagnosis present

## 2016-01-08 DIAGNOSIS — D638 Anemia in other chronic diseases classified elsewhere: Secondary | ICD-10-CM | POA: Diagnosis present

## 2016-01-08 DIAGNOSIS — M7989 Other specified soft tissue disorders: Secondary | ICD-10-CM

## 2016-01-08 DIAGNOSIS — I959 Hypotension, unspecified: Secondary | ICD-10-CM | POA: Diagnosis present

## 2016-01-08 DIAGNOSIS — N2581 Secondary hyperparathyroidism of renal origin: Secondary | ICD-10-CM | POA: Diagnosis present

## 2016-01-08 DIAGNOSIS — K921 Melena: Secondary | ICD-10-CM | POA: Diagnosis not present

## 2016-01-08 DIAGNOSIS — Z66 Do not resuscitate: Secondary | ICD-10-CM | POA: Diagnosis present

## 2016-01-08 DIAGNOSIS — Z9181 History of falling: Secondary | ICD-10-CM

## 2016-01-08 DIAGNOSIS — D62 Acute posthemorrhagic anemia: Secondary | ICD-10-CM | POA: Diagnosis present

## 2016-01-08 DIAGNOSIS — K2210 Ulcer of esophagus without bleeding: Secondary | ICD-10-CM | POA: Diagnosis present

## 2016-01-08 DIAGNOSIS — S91302A Unspecified open wound, left foot, initial encounter: Secondary | ICD-10-CM | POA: Diagnosis present

## 2016-01-08 DIAGNOSIS — E785 Hyperlipidemia, unspecified: Secondary | ICD-10-CM | POA: Diagnosis present

## 2016-01-08 DIAGNOSIS — Z992 Dependence on renal dialysis: Secondary | ICD-10-CM

## 2016-01-08 DIAGNOSIS — E039 Hypothyroidism, unspecified: Secondary | ICD-10-CM | POA: Diagnosis present

## 2016-01-08 DIAGNOSIS — K449 Diaphragmatic hernia without obstruction or gangrene: Secondary | ICD-10-CM | POA: Diagnosis present

## 2016-01-08 DIAGNOSIS — K922 Gastrointestinal hemorrhage, unspecified: Secondary | ICD-10-CM | POA: Diagnosis present

## 2016-01-08 DIAGNOSIS — Z7189 Other specified counseling: Secondary | ICD-10-CM | POA: Insufficient documentation

## 2016-01-08 DIAGNOSIS — Z96651 Presence of right artificial knee joint: Secondary | ICD-10-CM | POA: Diagnosis present

## 2016-01-08 DIAGNOSIS — Z79899 Other long term (current) drug therapy: Secondary | ICD-10-CM

## 2016-01-08 DIAGNOSIS — K279 Peptic ulcer, site unspecified, unspecified as acute or chronic, without hemorrhage or perforation: Secondary | ICD-10-CM | POA: Diagnosis present

## 2016-01-08 DIAGNOSIS — Z95810 Presence of automatic (implantable) cardiac defibrillator: Secondary | ICD-10-CM

## 2016-01-08 DIAGNOSIS — I739 Peripheral vascular disease, unspecified: Secondary | ICD-10-CM | POA: Diagnosis present

## 2016-01-08 DIAGNOSIS — Z87891 Personal history of nicotine dependence: Secondary | ICD-10-CM

## 2016-01-08 DIAGNOSIS — F329 Major depressive disorder, single episode, unspecified: Secondary | ICD-10-CM | POA: Diagnosis present

## 2016-01-08 DIAGNOSIS — I2510 Atherosclerotic heart disease of native coronary artery without angina pectoris: Secondary | ICD-10-CM | POA: Diagnosis present

## 2016-01-08 DIAGNOSIS — Z8673 Personal history of transient ischemic attack (TIA), and cerebral infarction without residual deficits: Secondary | ICD-10-CM

## 2016-01-08 DIAGNOSIS — L97429 Non-pressure chronic ulcer of left heel and midfoot with unspecified severity: Secondary | ICD-10-CM | POA: Diagnosis present

## 2016-01-08 DIAGNOSIS — I255 Ischemic cardiomyopathy: Secondary | ICD-10-CM | POA: Diagnosis present

## 2016-01-08 DIAGNOSIS — Z7901 Long term (current) use of anticoagulants: Secondary | ICD-10-CM

## 2016-01-08 DIAGNOSIS — M109 Gout, unspecified: Secondary | ICD-10-CM | POA: Diagnosis present

## 2016-01-08 DIAGNOSIS — L89150 Pressure ulcer of sacral region, unstageable: Secondary | ICD-10-CM | POA: Diagnosis present

## 2016-01-08 DIAGNOSIS — Z8719 Personal history of other diseases of the digestive system: Secondary | ICD-10-CM

## 2016-01-08 DIAGNOSIS — I132 Hypertensive heart and chronic kidney disease with heart failure and with stage 5 chronic kidney disease, or end stage renal disease: Secondary | ICD-10-CM | POA: Diagnosis present

## 2016-01-08 DIAGNOSIS — Z515 Encounter for palliative care: Secondary | ICD-10-CM | POA: Diagnosis present

## 2016-01-08 DIAGNOSIS — Z7902 Long term (current) use of antithrombotics/antiplatelets: Secondary | ICD-10-CM

## 2016-01-08 DIAGNOSIS — I252 Old myocardial infarction: Secondary | ICD-10-CM

## 2016-01-08 DIAGNOSIS — M1990 Unspecified osteoarthritis, unspecified site: Secondary | ICD-10-CM | POA: Diagnosis present

## 2016-01-08 LAB — CBC
HCT: 24.3 % — ABNORMAL LOW (ref 39.0–52.0)
Hemoglobin: 7.8 g/dL — ABNORMAL LOW (ref 13.0–17.0)
MCH: 27.6 pg (ref 26.0–34.0)
MCHC: 32.1 g/dL (ref 30.0–36.0)
MCV: 85.9 fL (ref 78.0–100.0)
PLATELETS: 215 10*3/uL (ref 150–400)
RBC: 2.83 MIL/uL — AB (ref 4.22–5.81)
RDW: 21.7 % — ABNORMAL HIGH (ref 11.5–15.5)
WBC: 9.6 10*3/uL (ref 4.0–10.5)

## 2016-01-08 LAB — FERRITIN: FERRITIN: 824 ng/mL — AB (ref 24–336)

## 2016-01-08 LAB — CBC WITH DIFFERENTIAL/PLATELET
BASOS ABS: 0 10*3/uL (ref 0.0–0.1)
Basophils Relative: 0 %
Eosinophils Absolute: 0.1 10*3/uL (ref 0.0–0.7)
Eosinophils Relative: 1 %
HEMATOCRIT: 29.1 % — AB (ref 39.0–52.0)
HEMOGLOBIN: 8.9 g/dL — AB (ref 13.0–17.0)
LYMPHS PCT: 11 %
Lymphs Abs: 0.9 10*3/uL (ref 0.7–4.0)
MCH: 26.2 pg (ref 26.0–34.0)
MCHC: 30.6 g/dL (ref 30.0–36.0)
MCV: 85.6 fL (ref 78.0–100.0)
MONOS PCT: 15 %
Monocytes Absolute: 1.2 10*3/uL — ABNORMAL HIGH (ref 0.1–1.0)
NEUTROS ABS: 6 10*3/uL (ref 1.7–7.7)
Neutrophils Relative %: 73 %
Platelets: 240 10*3/uL (ref 150–400)
RBC: 3.4 MIL/uL — ABNORMAL LOW (ref 4.22–5.81)
RDW: 21.3 % — ABNORMAL HIGH (ref 11.5–15.5)
WBC: 8.2 10*3/uL (ref 4.0–10.5)

## 2016-01-08 LAB — MRSA PCR SCREENING: MRSA by PCR: NEGATIVE

## 2016-01-08 LAB — I-STAT CHEM 8, ED
BUN: 48 mg/dL — ABNORMAL HIGH (ref 6–20)
CALCIUM ION: 1.07 mmol/L — AB (ref 1.13–1.30)
CREATININE: 6 mg/dL — AB (ref 0.61–1.24)
Chloride: 99 mmol/L — ABNORMAL LOW (ref 101–111)
GLUCOSE: 82 mg/dL (ref 65–99)
HCT: 32 % — ABNORMAL LOW (ref 39.0–52.0)
HEMOGLOBIN: 10.9 g/dL — AB (ref 13.0–17.0)
POTASSIUM: 4.5 mmol/L (ref 3.5–5.1)
Sodium: 138 mmol/L (ref 135–145)
TCO2: 26 mmol/L (ref 0–100)

## 2016-01-08 LAB — FOLATE: Folate: 14.9 ng/mL (ref >5.9)

## 2016-01-08 LAB — COMPREHENSIVE METABOLIC PANEL
ALBUMIN: 1.8 g/dL — AB (ref 3.5–5.0)
ALK PHOS: 104 U/L (ref 38–126)
ALT: 23 U/L (ref 17–63)
ANION GAP: 14 (ref 5–15)
AST: 30 U/L (ref 15–41)
BILIRUBIN TOTAL: 1.2 mg/dL (ref 0.3–1.2)
BUN: 51 mg/dL — AB (ref 6–20)
CALCIUM: 9.1 mg/dL (ref 8.9–10.3)
CO2: 24 mmol/L (ref 22–32)
CREATININE: 6.1 mg/dL — AB (ref 0.61–1.24)
Chloride: 102 mmol/L (ref 101–111)
GFR calc Af Amer: 9 mL/min — ABNORMAL LOW (ref >60)
GFR calc non Af Amer: 8 mL/min — ABNORMAL LOW (ref >60)
GLUCOSE: 87 mg/dL (ref 65–99)
Potassium: 4.8 mmol/L (ref 3.5–5.1)
Sodium: 140 mmol/L (ref 135–145)
TOTAL PROTEIN: 5.1 g/dL — AB (ref 6.5–8.1)

## 2016-01-08 LAB — IRON AND TIBC: Iron: 41 ug/dL — ABNORMAL LOW (ref 45–182)

## 2016-01-08 LAB — RETICULOCYTES
RBC.: 3.4 MIL/uL — AB (ref 4.22–5.81)
RETIC COUNT ABSOLUTE: 81.6 10*3/uL (ref 19.0–186.0)
RETIC CT PCT: 2.4 % (ref 0.4–3.1)

## 2016-01-08 LAB — PROTIME-INR
INR: 3.53 — ABNORMAL HIGH (ref 0.00–1.49)
INR: 1.55 — ABNORMAL HIGH (ref 0.00–1.49)
INR: 1.39 (ref 0.00–1.49)
PROTHROMBIN TIME: 18.6 s — AB (ref 11.6–15.2)
Prothrombin Time: 34.6 seconds — ABNORMAL HIGH (ref 11.6–15.2)
Prothrombin Time: 17.2 seconds — ABNORMAL HIGH (ref 11.6–15.2)

## 2016-01-08 LAB — VITAMIN B12: Vitamin B-12: 1578 pg/mL — ABNORMAL HIGH (ref 180–914)

## 2016-01-08 MED ORDER — NEPRO/CARBSTEADY PO LIQD
237.0000 mL | Freq: Two times a day (BID) | ORAL | Status: DC
  Administered 2016-01-13: 237 mL via ORAL
  Filled 2016-01-08 (×6): qty 237

## 2016-01-08 MED ORDER — ACETAMINOPHEN 325 MG PO TABS: 650.0000 mg | ORAL_TABLET | Freq: Four times a day (QID) | ORAL | Status: DC | PRN

## 2016-01-08 MED ORDER — DOXERCALCIFEROL 4 MCG/2ML IV SOLN
5.0000 ug | INTRAVENOUS | Status: DC
  Administered 2016-01-09: 5 ug via INTRAVENOUS
  Filled 2016-01-08 (×2): qty 4

## 2016-01-08 MED ORDER — SODIUM CHLORIDE 0.9% FLUSH
3.0000 mL | Freq: Two times a day (BID) | INTRAVENOUS | Status: DC
  Administered 2016-01-08 – 2016-01-11 (×7): 3 mL via INTRAVENOUS

## 2016-01-08 MED ORDER — ONDANSETRON HCL 4 MG/2ML IJ SOLN: 4.0000 mg | Freq: Four times a day (QID) | INTRAMUSCULAR | Status: DC | PRN

## 2016-01-08 MED ORDER — SODIUM CHLORIDE 0.9% FLUSH
3.0000 mL | Freq: Two times a day (BID) | INTRAVENOUS | Status: DC
  Administered 2016-01-08 – 2016-01-13 (×5): 3 mL via INTRAVENOUS

## 2016-01-08 MED ORDER — CARVEDILOL 3.125 MG PO TABS
3.1250 mg | ORAL_TABLET | Freq: Two times a day (BID) | ORAL | Status: DC
  Administered 2016-01-09 – 2016-01-10 (×2): 3.125 mg via ORAL
  Filled 2016-01-08 (×4): qty 1

## 2016-01-08 MED ORDER — MIDODRINE HCL 5 MG PO TABS
10.0000 mg | ORAL_TABLET | ORAL | Status: DC
  Administered 2016-01-09 – 2016-01-12 (×2): 10 mg via ORAL
  Filled 2016-01-08: qty 2

## 2016-01-08 MED ORDER — VITAMIN K1 10 MG/ML IJ SOLN
10.0000 mg | INTRAVENOUS | Status: AC
  Administered 2016-01-08: 10 mg via INTRAVENOUS
  Filled 2016-01-08: qty 1

## 2016-01-08 MED ORDER — PROTHROMBIN COMPLEX CONC HUMAN 500 UNITS IV KIT: 25.0000 [IU]/kg | PACK | Status: DC

## 2016-01-08 MED ORDER — ONDANSETRON HCL 4 MG PO TABS: 4.0000 mg | ORAL_TABLET | Freq: Four times a day (QID) | ORAL | Status: DC | PRN

## 2016-01-08 MED ORDER — ATORVASTATIN CALCIUM 40 MG PO TABS
40.0000 mg | ORAL_TABLET | Freq: Every day | ORAL | Status: DC
  Administered 2016-01-09 – 2016-01-12 (×4): 40 mg via ORAL
  Filled 2016-01-08 (×4): qty 1

## 2016-01-08 MED ORDER — SODIUM CHLORIDE 0.9 % IV BOLUS (SEPSIS)
1000.0000 mL | Freq: Once | INTRAVENOUS | Status: AC
  Administered 2016-01-08: 1000 mL via INTRAVENOUS

## 2016-01-08 MED ORDER — ALLOPURINOL 300 MG PO TABS
300.0000 mg | ORAL_TABLET | Freq: Every day | ORAL | Status: DC
  Administered 2016-01-08 – 2016-01-11 (×4): 300 mg via ORAL
  Filled 2016-01-08 (×4): qty 1

## 2016-01-08 MED ORDER — DARBEPOETIN ALFA 200 MCG/0.4ML IJ SOSY
200.0000 ug | PREFILLED_SYRINGE | INTRAMUSCULAR | Status: DC
  Administered 2016-01-09: 200 ug via INTRAVENOUS
  Filled 2016-01-08: qty 0.4

## 2016-01-08 MED ORDER — ADULT MULTIVITAMIN W/MINERALS CH: 1.0000 | ORAL_TABLET | Freq: Every day | ORAL | Status: DC

## 2016-01-08 MED ORDER — ACETAMINOPHEN 650 MG RE SUPP: 650.0000 mg | Freq: Four times a day (QID) | RECTAL | Status: DC | PRN

## 2016-01-08 MED ORDER — PROTHROMBIN COMPLEX CONC HUMAN 500 UNITS IV KIT
1500.0000 [IU] | PACK | Status: AC
  Administered 2016-01-08: 1500 [IU] via INTRAVENOUS
  Filled 2016-01-08: qty 60

## 2016-01-08 MED ORDER — PANTOPRAZOLE SODIUM 40 MG IV SOLR
40.0000 mg | Freq: Two times a day (BID) | INTRAVENOUS | Status: DC
  Administered 2016-01-08 – 2016-01-13 (×10): 40 mg via INTRAVENOUS
  Filled 2016-01-08 (×10): qty 40

## 2016-01-08 MED ORDER — PHYTONADIONE 5 MG PO TABS
2.5000 mg | ORAL_TABLET | Freq: Once | ORAL | Status: AC
  Administered 2016-01-08: 2.5 mg via ORAL
  Filled 2016-01-08: qty 1

## 2016-01-08 MED ORDER — PANTOPRAZOLE SODIUM 40 MG IV SOLR
40.0000 mg | Freq: Once | INTRAVENOUS | Status: AC
  Administered 2016-01-08: 40 mg via INTRAVENOUS
  Filled 2016-01-08: qty 40

## 2016-01-08 MED ORDER — PANTOPRAZOLE SODIUM 40 MG IV SOLR: 40.0000 mg | INTRAVENOUS | Status: DC

## 2016-01-08 MED ORDER — SODIUM CHLORIDE 0.9 % IV SOLN: 250.0000 mL | INTRAVENOUS | Status: DC | PRN

## 2016-01-08 MED ORDER — SODIUM CHLORIDE 0.9% FLUSH: 3.0000 mL | INTRAVENOUS | Status: DC | PRN

## 2016-01-08 MED ORDER — OCUVITE-LUTEIN PO CAPS
1.0000 | ORAL_CAPSULE | Freq: Every day | ORAL | Status: DC
  Administered 2016-01-08: 1 via ORAL
  Filled 2016-01-08 (×2): qty 1

## 2016-01-08 MED ORDER — DULOXETINE HCL 30 MG PO CPEP
30.0000 mg | ORAL_CAPSULE | Freq: Every day | ORAL | Status: DC
  Administered 2016-01-09 – 2016-01-13 (×4): 30 mg via ORAL
  Filled 2016-01-08 (×4): qty 1

## 2016-01-08 NOTE — ED Provider Notes (Signed)
CSN: [id number]     Arrival date & time 01/08/16  0906 History   First MD Initiated Contact with Patient 01/08/16 [phone]     Chief Complaint  Patient presents with  . GI Bleeding     (Consider location/radiation/quality/duration/timing/severity/associated sxs/prior Treatment) Patient is a 82 y.o. male presenting with abdominal pain. The history is provided by the patient and the spouse (Patient started last night with maroon colored stool. Bleeding from rectum).  Abdominal Pain Pain location:  Generalized Pain quality: aching   Pain radiates to:  Does not radiate Pain severity:  Mild Onset quality:  Sudden Timing:  Intermittent Progression:  Unchanged Chronicity:  New Context: alcohol use   Associated symptoms: no chest pain, no cough, no diarrhea, no fatigue and no hematuria     Past Medical History  Diagnosis Date  . Dyslipidemia   . Chronic systolic CHF (congestive heart failure) ([place])     a. s/p ICD with upgrade to Medtronic BiV-ICD 07/2015 when it had reached ERI.  . Paroxysmal atrial fibrillation ([place])   . Hypothyroidism   . Coronary artery disease     a. ant-lat MI s/p PTCA/DES to LAD 2006.  Marland Kitchen Hypertension   . Ischemic cardiomyopathy     a. s/p MDT CRTD  . AICD (automatic cardioverter/defibrillator) present   . Myocardial infarction ([place])   . Depression     hx  . History of blood transfusion 03/2015    LGIB  . GI bleed     a. 03/2015 in setting of elevated INR.  Marland Kitchen Arthritis     "everywhere"  . History of gout   . ESRD (end stage renal disease) on dialysis [facility])     "[place]; MWF" (07/10/2015)  . Prostate cancer ([place])     S/P seed implants  . Critical lower limb ischemia   . LBBB (left bundle branch block)   . Atrial tachycardia ([place])     a. WCT 06/2015 felt to be atrial tach.  . Anemia of chronic disease   . PAD (peripheral artery disease) [facility])    Past Surgical History  Procedure Laterality Date  . Medtronic biv icd  10/2006    Dr Lovena Le  . Total  knee arthroplasty Right   . Av fistula placement  10/13/2012    Procedure: ARTERIOVENOUS (AV) FISTULA CREATION;  Surgeon: Mal Misty, MD;  Location: [place];  Service: Vascular;  Laterality: Right;  Creation right arm arteriovenous fistula   . Insertion of dialysis catheter  10/13/2012    Procedure: INSERTION OF DIALYSIS CATHETER;  Surgeon: Mal Misty, MD;  Location: [place];  Service: Vascular;  Laterality: N/A;  right internal jugular vein  . Ligation of competing branches of arteriovenous fistula  11/24/2012    Procedure: LIGATION OF COMPETING BRANCHES OF ARTERIOVENOUS FISTULA;  Surgeon: Rosetta Posner, MD;  Location[facility] OR;  Service: Vascular;  Laterality: Right;  ligation of competing branch right arteriovenous fistula  . Av fistula placement Left 01/08/2013    Procedure: ARTERIOVENOUS (AV) FISTULA CREATION;  Surgeon: Elam Dutch, MD;  Location: [place];  Service: Vascular;  Laterality: Left;  . Cardioversion N/A 07/21/2012    Procedure: CARDIOVERSION;  Surgeon: Evans Lance, MD;  Location: [facility] CATH LAB;  Service: Cardiovascular;  Laterality: N/A;  . Esophagogastroduodenoscopy Left 04/09/2015    Procedure: ESOPHAGOGASTRODUODENOSCOPY (EGD);  Surgeon: Laurence Spates, MD;  Location: [facility] ENDOSCOPY;  Service: Endoscopy;  Laterality: Left;  . Colonoscopy N/A 04/10/2015    Procedure: COLONOSCOPY;  Surgeon: Laurence Spates, MD;  Location: [facility] ENDOSCOPY;  Service: Endoscopy;  Laterality: N/A;  . Joint replacement    . Biv icd genertaor change out  X 1    "replaced the battery"  . Coronary angioplasty with stent placement  02/2005    PTCA and DES to LAD Dr Terrence Dupont  . Insertion prostate radiation seed    . Ep implantable device N/A 08/07/2015    Procedure: ICD/BIV ICD Generator Changeout;  Surgeon: Evans Lance, MD;  Location: [place] CV LAB;  Service: Cardiovascular;  Laterality: N/A;  . Peripheral vascular catheterization N/A 12/17/2015    Procedure: Lower Extremity Intervention;  Surgeon:  Wellington Hampshire, MD;  Location: [place] CV LAB;  Service: Cardiovascular;  Laterality: N/A;  Bilateral leg runoff performed  . Peripheral vascular catheterization N/A 12/17/2015    Procedure: Abdominal Aortogram;  Surgeon: Wellington Hampshire, MD;  Location: [place] CV LAB;  Service: Cardiovascular;  Laterality: N/A;  . Peripheral vascular catheterization Left 12/17/2015    Procedure: Peripheral Vascular Intervention;  Surgeon: Wellington Hampshire, MD;  Location: [place] CV LAB;  Service: Cardiovascular;  Laterality: Left;  peroneal artery   Family History  Problem Relation Age of Onset  . Diabetes Mother    Social History  Substance Use Topics  . Smoking status: Former Smoker -- 10 years    Types: Cigarettes, Cigars  . Smokeless tobacco: Never Used     Comment: stopped smoking in 1970  . Alcohol Use: Yes     Comment: "stopped drinking alcohol in the 1980's"    Review of Systems  Constitutional: Negative for appetite change and fatigue.  HENT: Negative for congestion, ear discharge and sinus pressure.   Eyes: Negative for discharge.  Respiratory: Negative for cough.   Cardiovascular: Negative for chest pain.  Gastrointestinal: Positive for abdominal pain. Negative for diarrhea.       Rectal bleeding  Genitourinary: Negative for frequency and hematuria.  Musculoskeletal: Negative for back pain.  Skin: Negative for rash.  Neurological: Negative for seizures and headaches.  Psychiatric/Behavioral: Negative for hallucinations.      Allergies  Review of patient's allergies indicates no known allergies.  Home Medications   Prior to Admission medications   Medication Sig Start Date End Date Taking? Authorizing Provider  allopurinol (ZYLOPRIM) 100 MG tablet Take 100 mg by mouth daily.  06/12/12   Historical Provider, MD  amiodarone (PACERONE) 200 MG tablet Take 200 mg by mouth daily.     Historical Provider, MD  atorvastatin (LIPITOR) 40 MG tablet Take 40 mg by mouth daily  at 6 PM.     Historical Provider, MD  carvedilol (COREG) 3.125 MG tablet Take 1 tablet (3.125 mg total) by mouth 2 (two) times daily with a meal. 07/11/15   Dixie Dials, MD  clopidogrel (PLAVIX) 75 MG tablet Take 1 tablet (75 mg total) by mouth daily with breakfast. 12/19/15   Almyra Deforest, PA  collagenase (SANTYL) ointment Apply 1 application topically daily. 11/18/15   Trula Slade, DPM  diphenoxylate-atropine (LOMOTIL) 2.5-0.025 MG tablet 1 tablet once per day if greater than one episode of diarrhea. Patient not taking: Reported on 12/26/2015 11/25/15   Tanna Furry, MD  DULoxetine (CYMBALTA) 30 MG capsule Take 30 mg by mouth daily. 12/01/15   Historical Provider, MD  ethyl chloride spray Apply 1 application topically as needed. (prior to dialysis) 10/08/15   Historical Provider, MD  midodrine (PROAMATINE) 10 MG tablet Take 10 mg by mouth every Monday,  Wednesday, and Friday.  10/11/15   Historical Provider, MD  Multiple Vitamin (MULTIVITAMIN WITH MINERALS) TABS Take 1 tablet by mouth daily. 50+    Historical Provider, MD  Multiple Vitamins-Minerals (DECUBI-VITE) CAPS Take 1 capsule by mouth daily.    Historical Provider, MD  Nutritional Supplements (FEEDING SUPPLEMENT, NEPRO CARB STEADY,) LIQD Take 237 mLs by mouth 2 (two) times daily between meals. 12pm, 5pm    Historical Provider, MD  silver sulfADIAZINE (SILVADENE) 1 % cream Apply 1 application topically daily. 11/19/15   Trula Slade, DPM  warfarin (COUMADIN) 3 MG tablet Take 1.5-3 mg by mouth daily at 6 PM. Take 1/2 tablet (1.5 mg) by mouth on Sunday evening, take 1 tablet (3 mg) on all other days of the week 10/21/15   Historical Provider, MD   BP 93/49 mmHg  Pulse 71  Temp(Src) 97.4 F (36.3 C) (Oral)  Resp 22  Ht 5\' 11"  (1.803 m)  SpO2 100% Physical Exam  Constitutional: He is oriented to person, place, and time. He appears well-developed.  HENT:  Head: Normocephalic.  Eyes: Conjunctivae and EOM are normal. No scleral icterus.   Neck: Neck supple. No thyromegaly present.  Cardiovascular: Normal rate and regular rhythm.  Exam reveals no gallop and no friction rub.   No murmur heard. Pulmonary/Chest: No stridor. He has no wheezes. He has no rales. He exhibits no tenderness.  Abdominal: He exhibits no distension. There is no tenderness. There is no rebound.  Genitourinary:  Heme positive maroon stool  Musculoskeletal: Normal range of motion. He exhibits no edema.  Lymphadenopathy:    He has no cervical adenopathy.  Neurological: He is oriented to person, place, and time. He exhibits normal muscle tone. Coordination normal.  Skin: No rash noted. No erythema.  Psychiatric: He has a normal mood and affect. His behavior is normal.    ED Course  Procedures (including critical care time) Labs Review Labs Reviewed  CBC WITH DIFFERENTIAL/PLATELET - Abnormal; Notable for the following:    RBC 3.40 (*)    Hemoglobin 8.9 (*)    HCT 29.1 (*)    RDW 21.3 (*)    Monocytes Absolute 1.2 (*)    All other components within normal limits  COMPREHENSIVE METABOLIC PANEL - Abnormal; Notable for the following:    BUN 51 (*)    Creatinine, Ser 6.10 (*)    Total Protein 5.1 (*)    Albumin 1.8 (*)    GFR calc non Af Amer 8 (*)    GFR calc Af Amer 9 (*)    All other components within normal limits  VITAMIN B12 - Abnormal; Notable for the following:    Vitamin B-12 1578 (*)    All other components within normal limits  IRON AND TIBC - Abnormal; Notable for the following:    Iron 41 (*)    All other components within normal limits  FERRITIN - Abnormal; Notable for the following:    Ferritin 824 (*)    All other components within normal limits  RETICULOCYTES - Abnormal; Notable for the following:    RBC. 3.40 (*)    All other components within normal limits  PROTIME-INR - Abnormal; Notable for the following:    Prothrombin Time 34.6 (*)    INR 3.53 (*)    All other components within normal limits  I-STAT CHEM 8, ED -  Abnormal; Notable for the following:    Chloride 99 (*)    BUN 48 (*)    Creatinine, Ser  6.00 (*)    Calcium, Ion 1.07 (*)    Hemoglobin 10.9 (*)    HCT 32.0 (*)    All other components within normal limits  FOLATE  PROTIME-INR  PROTIME-INR  TYPE AND SCREEN    Imaging Review No results found. I have personally reviewed and evaluated these images and lab results as part of my medical decision-making.   EKG Interpretation None     CRITICAL CARE Performed by: Carmie Lanpher L Total critical care time:33minutes Critical care time was exclusive of separately billable procedures and treating other patients. Critical care was necessary to treat or prevent imminent or life-threatening deterioration. Critical care was time spent personally by me on the following activities: development of treatment plan with patient and/or surrogate as well as nursing, discussions with consultants, evaluation of patient's response to treatment, examination of patient, obtaining history from patient or surrogate, ordering and performing treatments and interventions, ordering and review of laboratory studies, ordering and review of radiographic studies, pulse oximetry and re-evaluation of patient's condition.   MDM   Final diagnoses:  Lower GI bleed    Patient will be admitted by medicine for upper GI bleed. He will have a GI consult and will also see nephrology    Milton Ferguson, MD 01/08/16 1216

## 2016-01-08 NOTE — Consult Note (Signed)
[facility] Consult Note  Referring Provider: No ref. provider found Primary Care Physician:  [name], MD Primary Gastroenterologist:  Dr.  Laurel Dimmer Complaint: Rectal bleeding HPI: [name] is an 82 y.o. black male  with end-stage renal disease, atrial fibrillation on Coumadin peripheral Vasquez disease lower extremities presents with 4 dark bloody to maroon stools without any pain. He had hemoglobin of 8.9 which was a 2 g drop in the last 3 days and his INR on Coumadin was 3.53. He has similar presentation in June 2016 and had an EGD and colonoscopy were essentially normal.  Past Medical History  Diagnosis Date  . Dyslipidemia   . Chronic systolic CHF (congestive heart failure) ([place])     a. s/p ICD with upgrade to Medtronic BiV-ICD 07/2015 when it had reached ERI.  . Paroxysmal atrial fibrillation ([place])   . Hypothyroidism   . Coronary artery disease     a. ant-lat MI s/p PTCA/DES to LAD 2006.  Marland Kitchen Hypertension   . Ischemic cardiomyopathy     a. s/p MDT CRTD  . AICD (automatic cardioverter/defibrillator) present   . Myocardial infarction ([place])   . Depression     hx  . History of blood transfusion 03/2015    LGIB  . GI bleed     a. 03/2015 in setting of elevated INR.  Marland Kitchen Arthritis     "everywhere"  . History of gout   . ESRD (end stage renal disease) on dialysis [facility])     "[place]; MWF" (07/10/2015)  . Prostate cancer ([place])     S/P seed implants  . Critical lower limb ischemia   . LBBB (left bundle branch block)   . Atrial tachycardia ([place])     a. WCT 06/2015 felt to be atrial tach.  . Anemia of chronic disease   . PAD (peripheral artery disease) [facility])     Past Surgical History  Procedure Laterality Date  . Medtronic biv icd  10/2006    Dr Lovena Le  . Total knee arthroplasty Right   . Av fistula placement  10/13/2012    Procedure: ARTERIOVENOUS (AV) FISTULA CREATION;  Surgeon: Mal Misty, MD;  Location: [place];  Service: Vascular;   Laterality: Right;  Creation right arm arteriovenous fistula   . Insertion of dialysis catheter  10/13/2012    Procedure: INSERTION OF DIALYSIS CATHETER;  Surgeon: Mal Misty, MD;  Location: [place];  Service: Vascular;  Laterality: N/A;  right internal jugular vein  . Ligation of competing branches of arteriovenous fistula  11/24/2012    Procedure: LIGATION OF COMPETING BRANCHES OF ARTERIOVENOUS FISTULA;  Surgeon: Rosetta Posner, MD;  Location: [facility] OR;  Service: Vascular;  Laterality: Right;  ligation of competing branch right arteriovenous fistula  . Av fistula placement Left 01/08/2013    Procedure: ARTERIOVENOUS (AV) FISTULA CREATION;  Surgeon: Elam Dutch, MD;  Location: [place];  Service: Vascular;  Laterality: Left;  . Cardioversion N/A 07/21/2012    Procedure: CARDIOVERSION;  Surgeon: Evans Lance, MD;  Location: [facility] CATH LAB;  Service: Cardiovascular;  Laterality: N/A;  . Esophagogastroduodenoscopy Left 04/09/2015    Procedure: ESOPHAGOGASTRODUODENOSCOPY (EGD);  Surgeon: Laurence Spates, MD;  Location: [facility] ENDOSCOPY;  Service: Endoscopy;  Laterality: Left;  . Colonoscopy N/A 04/10/2015    Procedure: COLONOSCOPY;  Surgeon: Laurence Spates, MD;  Location: [place];  Service: Endoscopy;  Laterality: N/A;  . Joint replacement    . Biv icd genertaor change out  X 1    "  replaced the battery"  . Coronary angioplasty with stent placement  02/2005    PTCA and DES to LAD Dr Terrence Dupont  . Insertion prostate radiation seed    . Ep implantable device N/A 08/07/2015    Procedure: ICD/BIV ICD Generator Changeout;  Surgeon: Evans Lance, MD;  Location: [place] CV LAB;  Service: Cardiovascular;  Laterality: N/A;  . Peripheral vascular catheterization N/A 12/17/2015    Procedure: Lower Extremity Intervention;  Surgeon: Wellington Hampshire, MD;  Location: [place] CV LAB;  Service: Cardiovascular;  Laterality: N/A;  Bilateral leg runoff performed  . Peripheral vascular catheterization N/A 12/17/2015     Procedure: Abdominal Aortogram;  Surgeon: Wellington Hampshire, MD;  Location: [place] CV LAB;  Service: Cardiovascular;  Laterality: N/A;  . Peripheral vascular catheterization Left 12/17/2015    Procedure: Peripheral Vascular Intervention;  Surgeon: Wellington Hampshire, MD;  Location: [place] CV LAB;  Service: Cardiovascular;  Laterality: Left;  peroneal artery     (Not in a hospital admission)  Allergies: No Known Allergies  Family History  Problem Relation Age of Onset  . Diabetes Mother     Social History:  reports that he has quit smoking. His smoking use included Cigarettes and Cigars. He quit after 10 years of use. He has never used smokeless tobacco. He reports that he drinks alcohol. He reports that he does not use illicit drugs.  Review of Systems: negative except as above   Blood pressure 93/49, pulse 71, temperature 97.4 F (36.3 C), temperature source Oral, resp. rate 22, height _0  (1.803 m), SpO2 100 %. Head: Normocephalic, without obvious abnormality, atraumatic Neck: no adenopathy, no carotid bruit, no JVD, supple, symmetrical, trachea midline and thyroid not enlarged, symmetric, no tenderness/mass/nodules Resp: clear to auscultation bilaterally Cardio: regular rate and rhythm, S1, S2 normal, no murmur, click, rub or gallop GI: Abdomen soft nondistended with normoactive bowel sounds. No hepatomegaly masses or guarding Extremities: extremities normal, atraumatic, no cyanosis or edema  Results for orders placed or performed during the hospital encounter of 01/08/16 (from the past 48 hour(s))  CBC with Differential/Platelet     Status: Abnormal   Collection Time: 01/08/16  9:34 AM  Result Value Ref Range   WBC 8.2 4.0 - 10.5 K/uL   RBC 3.40 (L) 4.22 - 5.81 MIL/uL   Hemoglobin 8.9 (L) 13.0 - 17.0 g/dL   HCT 29.1 (L) 39.0 - 52.0 %   MCV 85.6 78.0 - 100.0 fL   MCH 26.2 26.0 - 34.0 pg   MCHC 30.6 30.0 - 36.0 g/dL   RDW 21.3 (H) 11.5 - 15.5 %   Platelets 240 150  - 400 K/uL   Neutrophils Relative % 73 %   Lymphocytes Relative 11 %   Monocytes Relative 15 %   Eosinophils Relative 1 %   Basophils Relative 0 %   Neutro Abs 6.0 1.7 - 7.7 K/uL   Lymphs Abs 0.9 0.7 - 4.0 K/uL   Monocytes Absolute 1.2 (H) 0.1 - 1.0 K/uL   Eosinophils Absolute 0.1 0.0 - 0.7 K/uL   Basophils Absolute 0.0 0.0 - 0.1 K/uL   RBC Morphology ELLIPTOCYTES     Comment: POLYCHROMASIA PRESENT TARGET CELLS   Comprehensive metabolic panel     Status: Abnormal   Collection Time: 01/08/16  9:34 AM  Result Value Ref Range   Sodium 140 135 - 145 mmol/L   Potassium 4.8 3.5 - 5.1 mmol/L   Chloride 102 101 - 111 mmol/L  CO2 24 22 - 32 mmol/L   Glucose, Bld 87 65 - 99 mg/dL   BUN 51 (H) 6 - 20 mg/dL   Creatinine, Ser 6.10 (H) 0.61 - 1.24 mg/dL   Calcium 9.1 8.9 - 10.3 mg/dL   Total Protein 5.1 (L) 6.5 - 8.1 g/dL   Albumin 1.8 (L) 3.5 - 5.0 g/dL   AST 30 15 - 41 U/L   ALT 23 17 - 63 U/L   Alkaline Phosphatase 104 38 - 126 U/L   Total Bilirubin 1.2 0.3 - 1.2 mg/dL   GFR calc non Af Amer 8 (L) >60 mL/min   GFR calc Af Amer 9 (L) >60 mL/min    Comment: (NOTE) The eGFR has been calculated using the CKD EPI equation. This calculation has not been validated in all clinical situations. eGFR's persistently <60 mL/min signify possible Chronic Kidney Disease.    Anion gap 14 5 - 15  Vitamin B12     Status: Abnormal   Collection Time: 01/08/16  9:34 AM  Result Value Ref Range   Vitamin B-12 1578 (H) 180 - 914 pg/mL  Folate     Status: None   Collection Time: 01/08/16  9:34 AM  Result Value Ref Range   Folate 14.9 >5.9 ng/mL  Iron and TIBC     Status: Abnormal   Collection Time: 01/08/16  9:34 AM  Result Value Ref Range   Iron 41 (L) 45 - 182 ug/dL   TIBC NOT CALCULATED 250 - 450 ug/dL    Comment: TRANSFERRIN <26m/dL   Saturation Ratios NOT CALCULATED 17.9 - 39.5 %   UIBC NOT CALCULATED ug/dL  Ferritin     Status: Abnormal   Collection Time: 01/08/16  9:34 AM  Result Value  Ref Range   Ferritin 824 (H) 24 - 336 ng/mL  Reticulocytes     Status: Abnormal   Collection Time: 01/08/16  9:34 AM  Result Value Ref Range   Retic Ct Pct 2.4 0.4 - 3.1 %   RBC. 3.40 (L) 4.22 - 5.81 MIL/uL   Retic Count, Manual 81.6 19.0 - 186.0 K/uL  Protime-INR     Status: Abnormal   Collection Time: 01/08/16  9:34 AM  Result Value Ref Range   Prothrombin Time 34.6 (H) 11.6 - 15.2 seconds   INR 3.53 (H) 0.00 - 1.49  Type and screen     Status: None   Collection Time: 01/08/16  9:35 AM  Result Value Ref Range   ABO/RH(D) O POS    Antibody Screen NEG    Sample Expiration 01/11/2016   I-stat chem 8, ed     Status: Abnormal   Collection Time: 01/08/16  9:47 AM  Result Value Ref Range   Sodium 138 135 - 145 mmol/L   Potassium 4.5 3.5 - 5.1 mmol/L   Chloride 99 (L) 101 - 111 mmol/L   BUN 48 (H) 6 - 20 mg/dL   Creatinine, Ser 6.00 (H) 0.61 - 1.24 mg/dL   Glucose, Bld 82 65 - 99 mg/dL   Calcium, Ion 1.07 (L) 1.13 - 1.30 mmol/L   TCO2 26 0 - 100 mmol/L   Hemoglobin 10.9 (L) 13.0 - 17.0 g/dL   HCT 32.0 (L) 39.0 - 52.0 %  Protime-INR     Status: Abnormal   Collection Time: 01/08/16  1:46 PM  Result Value Ref Range   Prothrombin Time 18.6 (H) 11.6 - 15.2 seconds   INR 1.55 (H) 0.00 - 1.49   No results found.  Assessment: GI bleeding suspect lower or small intestinal source with negative EGD and colonoscopy 9 months ago Plan:  Transfuse, reverse Coumadin, clear liquid diet, if bleeding persist pooled RBC scan, possibly followed by EGD, colonoscopy or arteriography as indicated. Teea Ducey C 01/08/2016, 3:48 PM  Pager [phone] If no answer or after 5 PM call [phone]

## 2016-01-08 NOTE — Progress Notes (Signed)
Subjective:  82 year old Who is known to [place]- has ESRD dialyzes at [facility] on Monday Wednesday Friday. He also has ischemic cardio myopathy with an ICD and is on Coumadin for atrial fibrillation. He has had issues late of a nonhealing foot ulcer. He underwent a PTA and stenting in late February but that stent occluded and he was readmitted on March 3 to stop the Coumadin and a reattempt at revascularization. However, interestingly he had refused that procedure, Had his Coumadin restarted and was discharged back to the nursing home on 3/13.  He's brought back from the nursing home for melanotic stools and hemoglobin was found to drop from the 11s to the 8s. He is pleasantly confused at my interview. He did get dialysis yesterday  Objective Vital signs in last 24 hours: Filed Vitals:   01/08/16 0912  BP: 93/49  Pulse: 71  Temp: 97.4 F (36.3 C)  TempSrc: Oral  Resp: 22  Height: 5\' 11"  (1.803 m)  SpO2: 100%   Weight change:   Intake/Output Summary (Last 24 hours) at 01/08/16 1725 Last data filed at 01/08/16 1530  Gross per 24 hour  Intake   1000 ml  Output      2 ml  Net    998 ml    Dialysis Orders: [facility] MWF 4h 66.5kg 2/2.25 bath LUA AVF Hep none Mircera: 200 mcg IV Q 2 weeks ( last dose 12/24/15 HGB 10.6) Venofer: 50 mg IV weekly (last dose 12/24/15) Hectoral: 5 mcg IV q treatment  Assessment/ Plan: Pt is a 82 y.o. yo male who was admitted on 01/08/2016 with  G.I. bleed on Coumadin Assessment/Plan: 1. GI bleed- Apparently has a history of this in the past and had invasive workup within the year. Per primary team, holding Coumadin, Plavix and watching hemoglobin closely. No heparin with dialysis 2. ESRD - Normally Monday, Wednesday Friday at [facility].  Has a fistula. We'll plan for regular dialysis tomorrow 3. Anemia- Clinically having G.I. bleed. Hemoglobin was down and eights but then recheck is up in the tens? Received Mircera  on 3/1- should be due again will give Aranesp  that this admission 4. Secondary hyperparathyroidism- Continue Hectorol 5. HTN/volume- Seems that chronically runs low blood pressure 6. PVD and foot wound- is not resolved- is at high risk for amputation   Damico Partin A    Labs: Basic Metabolic Panel:  Recent Labs Lab 01/05/16 0310 01/08/16 0934 01/08/16 0947  NA 137 140 138  K 3.6 4.8 4.5  CL 98* 102 99*  CO2 28 24  --   GLUCOSE 98 87 82  BUN 34* 51* 48*  CREATININE 5.60* 6.10* 6.00*  CALCIUM 9.5 9.1  --    Liver Function Tests:  Recent Labs Lab 01/08/16 0934  AST 30  ALT 23  ALKPHOS 104  BILITOT 1.2  PROT 5.1*  ALBUMIN 1.8*   No results for input(s): LIPASE, AMYLASE in the last 168 hours. No results for input(s): AMMONIA in the last 168 hours. CBC:  Recent Labs Lab 01/02/16 0315 01/03/16 0416 01/04/16 0442 01/05/16 0310 01/08/16 0934 01/08/16 0947  WBC 8.7 8.3 7.3 6.4 8.2  --   NEUTROABS  --   --   --   --  6.0  --   HGB 10.9* 11.0* 11.2* 11.0* 8.9* 10.9*  HCT 35.1* 36.1* 36.1* 35.6* 29.1* 32.0*  MCV 84.4 84.5 85.1 85.6 85.6  --   PLT 168 170 163 171 240  --    Cardiac Enzymes: No  results for input(s): CKTOTAL, CKMB, CKMBINDEX, TROPONINI in the last 168 hours. CBG: No results for input(s): GLUCAP in the last 168 hours.  Iron Studies:  Recent Labs  01/08/16 0934  IRON 41*  TIBC NOT CALCULATED  FERRITIN 824*   Studies/Results: No results found. Medications: Infusions:    Scheduled Medications: . pantoprazole (PROTONIX) IV  40 mg Intravenous Q12H    have reviewed scheduled and prn medications.  Physical Exam: General: confused pleasant "i dont really know what is happening" Heart: RRR Lungs: mostly clear Abdomen: soft, non tender Extremities: no edema Dialysis Access: right upper arm AVF patent    01/08/2016,5:25 PM  LOS: 0 days

## 2016-01-08 NOTE — ED Notes (Signed)
Patient is a HD patient who comes to ED with C/O GI Bleed. States that it began today. Patient from [facility].  States that he missed his HD appointment yesterday.  C/o some nausea that was relieved after he had a BM.

## 2016-01-08 NOTE — H&P (Signed)
[facility] History and Physical                                                                                    [name], is a 82 y.o. male  MRN: [id number]   DOB - 1930/09/24  Admit Date - 01/08/2016  Outpatient Primary MD for the patient is Jacob Brazil, MD  Cardiology: Dr Terrence Dupont Primary PV: Dr Gwenlyn Found Nephrologist: Dr Jonnie Finner   With History of -  Past Medical History  Diagnosis Date  . Dyslipidemia   . Chronic systolic CHF (congestive heart failure) ([place])     a. s/p ICD with upgrade to Medtronic BiV-ICD 07/2015 when it had reached ERI.  . Paroxysmal atrial fibrillation ([place])   . Hypothyroidism   . Coronary artery disease     a. ant-lat MI s/p PTCA/DES to LAD 2006.  Marland Kitchen Hypertension   . Ischemic cardiomyopathy     a. s/p MDT CRTD  . AICD (automatic cardioverter/defibrillator) present   . Myocardial infarction ([place])   . Depression     hx  . History of blood transfusion 03/2015    LGIB  . GI bleed     a. 03/2015 in setting of elevated INR.  Marland Kitchen Arthritis     "everywhere"  . History of gout   . ESRD (end stage renal disease) on dialysis [facility])     "[place]; MWF" (07/10/2015)  . Prostate cancer ([place])     S/P seed implants  . Critical lower limb ischemia   . LBBB (left bundle branch block)   . Atrial tachycardia ([place])     a. WCT 06/2015 felt to be atrial tach.  . Anemia of chronic disease   . PAD (peripheral artery disease) [facility])       Past Surgical History  Procedure Laterality Date  . Medtronic biv icd  10/2006    Dr Lovena Le  . Total knee arthroplasty Right   . Av fistula placement  10/13/2012    Procedure: ARTERIOVENOUS (AV) FISTULA CREATION;  Surgeon: Mal Misty, MD;  Location: [place];  Service: Vascular;  Laterality: Right;  Creation right arm arteriovenous fistula   . Insertion of dialysis catheter  10/13/2012    Procedure: INSERTION OF DIALYSIS CATHETER;  Surgeon: Mal Misty, MD;  Location: [place];  Service: Vascular;  Laterality:  N/A;  right internal jugular vein  . Ligation of competing branches of arteriovenous fistula  11/24/2012    Procedure: LIGATION OF COMPETING BRANCHES OF ARTERIOVENOUS FISTULA;  Surgeon: Rosetta Posner, MD;  Location: [facility] OR;  Service: Vascular;  Laterality: Right;  ligation of competing branch right arteriovenous fistula  . Av fistula placement Left 01/08/2013    Procedure: ARTERIOVENOUS (AV) FISTULA CREATION;  Surgeon: Elam Dutch, MD;  Location: [place];  Service: Vascular;  Laterality: Left;  . Cardioversion N/A 07/21/2012    Procedure: CARDIOVERSION;  Surgeon: Evans Lance, MD;  Location: [facility] CATH LAB;  Service: Cardiovascular;  Laterality: N/A;  . Esophagogastroduodenoscopy Left 04/09/2015    Procedure: ESOPHAGOGASTRODUODENOSCOPY (EGD);  Surgeon: Laurence Spates, MD;  Location: [facility] ENDOSCOPY;  Service: Endoscopy;  Laterality: Left;  . Colonoscopy N/A 04/10/2015  Procedure: COLONOSCOPY;  Surgeon: Laurence Spates, MD;  Location: [facility] ENDOSCOPY;  Service: Endoscopy;  Laterality: N/A;  . Joint replacement    . Biv icd genertaor change out  X 1    "replaced the battery"  . Coronary angioplasty with stent placement  02/2005    PTCA and DES to LAD Dr Terrence Dupont  . Insertion prostate radiation seed    . Ep implantable device N/A 08/07/2015    Procedure: ICD/BIV ICD Generator Changeout;  Surgeon: Evans Lance, MD;  Location: [place] CV LAB;  Service: Cardiovascular;  Laterality: N/A;  . Peripheral vascular catheterization N/A 12/17/2015    Procedure: Lower Extremity Intervention;  Surgeon: Wellington Hampshire, MD;  Location: [place] CV LAB;  Service: Cardiovascular;  Laterality: N/A;  Bilateral leg runoff performed  . Peripheral vascular catheterization N/A 12/17/2015    Procedure: Abdominal Aortogram;  Surgeon: Wellington Hampshire, MD;  Location: [place] CV LAB;  Service: Cardiovascular;  Laterality: N/A;  . Peripheral vascular catheterization Left 12/17/2015    Procedure: Peripheral Vascular  Intervention;  Surgeon: Wellington Hampshire, MD;  Location: [place] CV LAB;  Service: Cardiovascular;  Laterality: Left;  peroneal artery    in for   Chief Complaint  Patient presents with  . GI Bleeding     HPI  [name]  is a 82 y.o. male with pmh of ESRD on HD m/w/f, afib on chronic coumadin, CAD, HTN, HLD, ICM/chronic systolic HF (s/p ICD placement), chronic anemia, GIB in setting of supratherapeutic INR in 03/2015, PAD with LE ulcer. Pt was hospitalized 12/25/25 through 01/05/16 for planned revascularization after lower extremity dopplers showes occluded stent. During this hospitalization, pt's Coumadin held, INR reversed. However, pt ended up refusing procedure and was restarted on Coumadin with heparin bridge and d/c'd SNF when INR therapeutic. Pt has been back at SNF for 3 days. This am pt reports approximately 4 dark stools. ED nurse reports another large, melenic stool on arrival to ED. Pt describes some abdominal cramping just prior to BM, but otherwise no abdominal pain, n/v. Denies recent chest pain, sob, headache or dizziness.  Upon evaluation in the ED, pt's hgb 8.9 from 11.0 on 3/13. INR 3.53. was Pt to be admitted for further evaluation and treatment.    ROS No Fever-chills, No Headache, No changes with Vision or hearing, No problems swallowing food or Liquids, No Chest pain, Cough or Shortness of Breath, No Abdominal pain, No Nausea or Vomiting No dysuria, No new skin rashes or bruises, No new joints pains-aches,  No new weakness, tingling, numbness in any extremity, No recent weight gain or loss, A full 10 point Review of Systems was done, except as stated above, all other Review of Systems were negative.  Social History Social History  Substance Use Topics  . Smoking status: Former Smoker -- 10 years    Types: Cigarettes, Cigars  . Smokeless tobacco: Never Used     Comment: stopped smoking in 1970  . Alcohol Use: Yes     Comment: "stopped drinking alcohol  in the 1980's"  Retired from post office. Married  Family History Family History  Problem Relation Age of Onset  . Diabetes Mother     Prior to Admission medications   Medication Sig Start Date End Date Taking? Authorizing Provider  allopurinol (ZYLOPRIM) 100 MG tablet Take 100 mg by mouth daily.  06/12/12   Historical Provider, MD  atorvastatin (LIPITOR) 40 MG tablet Take 40 mg by mouth daily at 6 PM.  Historical Provider, MD  carvedilol (COREG) 3.125 MG tablet Take 1 tablet (3.125 mg total) by mouth 2 (two) times daily with a meal. 07/11/15   Dixie Dials, MD  clopidogrel (PLAVIX) 75 MG tablet Take 1 tablet (75 mg total) by mouth daily with breakfast. 12/19/15   Almyra Deforest, PA  collagenase (SANTYL) ointment Apply 1 application topically daily. 11/18/15   Trula Slade, DPM  diphenoxylate-atropine (LOMOTIL) 2.5-0.025 MG tablet 1 tablet once per day if greater than one episode of diarrhea. Patient not taking: Reported on 12/26/2015 11/25/15   Tanna Furry, MD  DULoxetine (CYMBALTA) 30 MG capsule Take 30 mg by mouth daily. 12/01/15   Historical Provider, MD  ethyl chloride spray Apply 1 application topically as needed. (prior to dialysis) 10/08/15   Historical Provider, MD  midodrine (PROAMATINE) 10 MG tablet Take 10 mg by mouth every Monday, Wednesday, and Friday.  10/11/15   Historical Provider, MD  Multiple Vitamin (MULTIVITAMIN WITH MINERALS) TABS Take 1 tablet by mouth daily. 50+    Historical Provider, MD  Multiple Vitamins-Minerals (DECUBI-VITE) CAPS Take 1 capsule by mouth daily.    Historical Provider, MD  Nutritional Supplements (FEEDING SUPPLEMENT, NEPRO CARB STEADY,) LIQD Take 237 mLs by mouth 2 (two) times daily between meals. 12pm, 5pm    Historical Provider, MD  silver sulfADIAZINE (SILVADENE) 1 % cream Apply 1 application topically daily. 11/19/15   Trula Slade, DPM  warfarin (COUMADIN) 3 MG tablet Take 1.5-3 mg by mouth daily at 6 PM. Take 1/2 tablet (1.5 mg) by mouth on  Sunday evening, take 1 tablet (3 mg) on all other days of the week 10/21/15   Historical Provider, MD    No Known Allergies  Physical Exam  Vitals  Blood pressure 93/49, pulse 71, temperature 97.4 F (36.3 C), temperature source Oral, resp. rate 22, height 5\' 11"  (1.803 m), SpO2 100 %.   General: elderly gentleman chronically weak appearing in NAD  Psych:  Normal affect and insight, Not Suicidal or Homicidal, Awake Alert, Oriented X 3.  Neuro:   No F.N deficits, ALL C.Nerves Intact, Strength 5/5 all 4 extremities, Sensation intact all 4 extremities.  ENT:  Ears and Eyes appear Normal, Conjunctivae clear, PER. Moist oral mucosa without erythema or exudates.  Neck:  Supple, No lymphadenopathy appreciated  Respiratory:  Symmetrical chest wall movement, Good air movement bilaterally, CTAB.  Cardiac:  RRR, No Murmurs, no LE edema noted, no JVD.    Abdomen:  Positive bowel sounds, Soft, Non tender, Non distended,  No masses appreciated  Skin:  Left heal ulcer with dressing c/d/i, sacral ulcer with scant amt of blood on dressing  Extremities:  Able to move all 4. 5/5 strength in each  Data Review  CBC  Recent Labs Lab 01/02/16 0315 01/03/16 0416 01/04/16 0442 01/05/16 0310 01/08/16 0934 01/08/16 0947  WBC 8.7 8.3 7.3 6.4 8.2  --   HGB 10.9* 11.0* 11.2* 11.0* 8.9* 10.9*  HCT 35.1* 36.1* 36.1* 35.6* 29.1* 32.0*  PLT 168 170 163 171 240  --   MCV 84.4 84.5 85.1 85.6 85.6  --   MCH 26.2 25.8* 26.4 26.4 26.2  --   MCHC 31.1 30.5 31.0 30.9 30.6  --   RDW 21.8* 22.0* 22.1* 21.8* 21.3*  --   LYMPHSABS  --   --   --   --  0.9  --   MONOABS  --   --   --   --  1.2*  --   EOSABS  --   --   --   --  0.1  --   BASOSABS  --   --   --   --  0.0  --     Chemistries   Recent Labs Lab 01/05/16 0310 01/08/16 0934 01/08/16 0947  NA 137 140 138  K 3.6 4.8 4.5  CL 98* 102 99*  CO2 28 24  --   GLUCOSE 98 87 82  BUN 34* 51* 48*  CREATININE 5.60* 6.10* 6.00*  CALCIUM 9.5 9.1   --   AST  --  30  --   ALT  --  23  --   ALKPHOS  --  104  --   BILITOT  --  1.2  --     estimated creatinine clearance is 8.8 mL/min (by C-G formula based on Cr of 6).  No results for input(s): TSH, T4TOTAL, T3FREE, THYROIDAB in the last 72 hours.  Invalid input(s): FREET3  Coagulation profile  Recent Labs Lab 01/02/16 0315 01/03/16 0416 01/04/16 0442 01/05/16 0310 01/08/16 0934  INR 1.60* 1.71* 2.26* 2.24* 3.53*     Urinalysis    Component Value Date/Time   COLORURINE YELLOW 09/29/2012 [place] 09/29/2012 1509   LABSPEC 1.010 09/29/2012 1509   PHURINE 5.0 09/29/2012 1509   GLUCOSEU NEGATIVE 09/29/2012 1509   HGBUR NEGATIVE 09/29/2012 1509   BILIRUBINUR NEGATIVE 09/29/2012 1509   KETONESUR NEGATIVE 09/29/2012 1509   PROTEINUR NEGATIVE 09/29/2012 1509   UROBILINOGEN 1.0 09/29/2012 1509   NITRITE NEGATIVE 09/29/2012 1509   LEUKOCYTESUR NEGATIVE 09/29/2012 1509     Assessment & Plan  Active Problems:   Chronic systolic heart failure (HCC)   End stage renal disease ([place])   Supratherapeutic INR   Cardiomyopathy, ischemic   Open wound of left heel   PAF (paroxysmal atrial fibrillation) (HCC)   Essential hypertension   History of GI bleed   Pressure ulcer   Lower GI bleed   GI bleed in setting of supratherapeutic INR -hx of same in 03/2015 at which time pt underwent upper and lower endoscopy with no source of bleeding found. SBP in 90's but this seems baseline for this pt -hold Coumadin and Plavix -q8h CBCs to monitor need for transfusion -PPI. NPO for now -GI called by ED for consult  Hx afib on chronic coumadin/supratherapeutic INR -currently NSR -hold Coumadin, give vit K x 1 dose in setting of active bleeding -monitor INR -pt is not a great Coumadin candidate given high fall risk and hx of GIB.   Chronic systolic HF/ICM -volume stable on admit. Last Echo 06/2015 EF 20-25%) -monitor  Anemia, chronic dz + GI loss -Hgb 8.9 from  11.0 on 3/13 -q8h CBCs to determine need for transfusion  Hx hypertension -SBP seems to run around 90s -will continue home BB for now with holding parameters. Monitor  ESRD on HD -last dialysis 3/15. Volume stable on admit -notify renal of admit  PVD with L heal ulcer -refused revascularization during recent admit -wound care  Sacral ulcer -wounld care     DVT Prophylaxis: supratherapeutic INR  AM Labs Ordered, also please review Full Orders  Family Communication:  Wife at bedside on admit  Code Status:  Full code  Condition:  guarded  Time spent in minutes : [place], NP on 01/08/2016 at 12:35 PM Between 7am to 7pm - Pager - [phone] After 7pm go to [email or web address] - password [email or web address]  And look for the night coverage person covering me after hours  [facility]   Patient  seen and examined. Agree with above note by Patrici Ranks, NP. Patient comes to the hospital today with complaints of melena in the setting of a supratherapeutic INR. Hemoglobin currently 8.9, plan to cycle CBCs transfuse if hemoglobin drops below 7. Will give 2.5 mg of vitamin K to try to reverse INR. Given his high fall risk and 2 episodes now of GI bleeding, would strongly suggest indefinite cessation of Coumadin at this point. EDP has discussed case with Dr. Amedeo Plenty with GI who will see patient in consultation. Will keep NPO in case endoscopic studies performed. IV twice a day PPI. Also has end-stage renal disease and dialyzes on a Monday, Wednesday, Friday schedule, nephrology has been notified of admission.   Domingo Mend, MD [facility] Pager: [phone]

## 2016-01-09 ENCOUNTER — Encounter (HOSPITAL_COMMUNITY)
Admission: EM | Disposition: A | Discharge status: Skilled Nursing Facility | Payer: Self-pay | Source: Home / Self Care | Attending: Internal Medicine

## 2016-01-09 ENCOUNTER — Observation Stay (HOSPITAL_COMMUNITY): Payer: Medicare Other | Admitting: Anesthesiology

## 2016-01-09 ENCOUNTER — Encounter (HOSPITAL_COMMUNITY): Payer: Self-pay | Admitting: Anesthesiology

## 2016-01-09 DIAGNOSIS — K449 Diaphragmatic hernia without obstruction or gangrene: Secondary | ICD-10-CM | POA: Diagnosis present

## 2016-01-09 DIAGNOSIS — Z7189 Other specified counseling: Secondary | ICD-10-CM | POA: Diagnosis not present

## 2016-01-09 DIAGNOSIS — F329 Major depressive disorder, single episode, unspecified: Secondary | ICD-10-CM | POA: Diagnosis present

## 2016-01-09 DIAGNOSIS — I739 Peripheral vascular disease, unspecified: Secondary | ICD-10-CM | POA: Diagnosis present

## 2016-01-09 DIAGNOSIS — K210 Gastro-esophageal reflux disease with esophagitis: Secondary | ICD-10-CM | POA: Diagnosis present

## 2016-01-09 DIAGNOSIS — I5022 Chronic systolic (congestive) heart failure: Secondary | ICD-10-CM

## 2016-01-09 DIAGNOSIS — Z9181 History of falling: Secondary | ICD-10-CM | POA: Diagnosis not present

## 2016-01-09 DIAGNOSIS — M109 Gout, unspecified: Secondary | ICD-10-CM | POA: Diagnosis present

## 2016-01-09 DIAGNOSIS — I2510 Atherosclerotic heart disease of native coronary artery without angina pectoris: Secondary | ICD-10-CM | POA: Diagnosis present

## 2016-01-09 DIAGNOSIS — K279 Peptic ulcer, site unspecified, unspecified as acute or chronic, without hemorrhage or perforation: Secondary | ICD-10-CM | POA: Diagnosis present

## 2016-01-09 DIAGNOSIS — L89150 Pressure ulcer of sacral region, unstageable: Secondary | ICD-10-CM | POA: Diagnosis present

## 2016-01-09 DIAGNOSIS — Z8546 Personal history of malignant neoplasm of prostate: Secondary | ICD-10-CM | POA: Diagnosis not present

## 2016-01-09 DIAGNOSIS — Z87891 Personal history of nicotine dependence: Secondary | ICD-10-CM | POA: Diagnosis not present

## 2016-01-09 DIAGNOSIS — Z66 Do not resuscitate: Secondary | ICD-10-CM | POA: Diagnosis not present

## 2016-01-09 DIAGNOSIS — D62 Acute posthemorrhagic anemia: Secondary | ICD-10-CM | POA: Diagnosis present

## 2016-01-09 DIAGNOSIS — K922 Gastrointestinal hemorrhage, unspecified: Secondary | ICD-10-CM | POA: Diagnosis present

## 2016-01-09 DIAGNOSIS — M1990 Unspecified osteoarthritis, unspecified site: Secondary | ICD-10-CM | POA: Diagnosis present

## 2016-01-09 DIAGNOSIS — K921 Melena: Secondary | ICD-10-CM | POA: Diagnosis present

## 2016-01-09 DIAGNOSIS — E785 Hyperlipidemia, unspecified: Secondary | ICD-10-CM | POA: Diagnosis present

## 2016-01-09 DIAGNOSIS — Z515 Encounter for palliative care: Secondary | ICD-10-CM | POA: Diagnosis not present

## 2016-01-09 DIAGNOSIS — R791 Abnormal coagulation profile: Secondary | ICD-10-CM | POA: Diagnosis present

## 2016-01-09 DIAGNOSIS — Z95810 Presence of automatic (implantable) cardiac defibrillator: Secondary | ICD-10-CM | POA: Diagnosis not present

## 2016-01-09 DIAGNOSIS — D638 Anemia in other chronic diseases classified elsewhere: Secondary | ICD-10-CM | POA: Diagnosis present

## 2016-01-09 DIAGNOSIS — N2581 Secondary hyperparathyroidism of renal origin: Secondary | ICD-10-CM | POA: Diagnosis present

## 2016-01-09 DIAGNOSIS — Z7902 Long term (current) use of antithrombotics/antiplatelets: Secondary | ICD-10-CM | POA: Diagnosis not present

## 2016-01-09 DIAGNOSIS — I255 Ischemic cardiomyopathy: Secondary | ICD-10-CM | POA: Diagnosis not present

## 2016-01-09 DIAGNOSIS — Z79899 Other long term (current) drug therapy: Secondary | ICD-10-CM | POA: Diagnosis not present

## 2016-01-09 DIAGNOSIS — Z992 Dependence on renal dialysis: Secondary | ICD-10-CM | POA: Diagnosis not present

## 2016-01-09 DIAGNOSIS — I10 Essential (primary) hypertension: Secondary | ICD-10-CM | POA: Diagnosis not present

## 2016-01-09 DIAGNOSIS — Z96651 Presence of right artificial knee joint: Secondary | ICD-10-CM | POA: Diagnosis present

## 2016-01-09 DIAGNOSIS — E039 Hypothyroidism, unspecified: Secondary | ICD-10-CM | POA: Diagnosis present

## 2016-01-09 DIAGNOSIS — K2210 Ulcer of esophagus without bleeding: Secondary | ICD-10-CM | POA: Diagnosis present

## 2016-01-09 DIAGNOSIS — L97429 Non-pressure chronic ulcer of left heel and midfoot with unspecified severity: Secondary | ICD-10-CM | POA: Diagnosis present

## 2016-01-09 DIAGNOSIS — Z955 Presence of coronary angioplasty implant and graft: Secondary | ICD-10-CM | POA: Diagnosis not present

## 2016-01-09 DIAGNOSIS — N186 End stage renal disease: Secondary | ICD-10-CM | POA: Diagnosis present

## 2016-01-09 DIAGNOSIS — I480 Paroxysmal atrial fibrillation: Secondary | ICD-10-CM | POA: Diagnosis present

## 2016-01-09 DIAGNOSIS — I959 Hypotension, unspecified: Secondary | ICD-10-CM | POA: Diagnosis present

## 2016-01-09 DIAGNOSIS — Z8673 Personal history of transient ischemic attack (TIA), and cerebral infarction without residual deficits: Secondary | ICD-10-CM | POA: Diagnosis not present

## 2016-01-09 DIAGNOSIS — I132 Hypertensive heart and chronic kidney disease with heart failure and with stage 5 chronic kidney disease, or end stage renal disease: Secondary | ICD-10-CM | POA: Diagnosis present

## 2016-01-09 DIAGNOSIS — I252 Old myocardial infarction: Secondary | ICD-10-CM | POA: Diagnosis not present

## 2016-01-09 DIAGNOSIS — Z7901 Long term (current) use of anticoagulants: Secondary | ICD-10-CM | POA: Diagnosis not present

## 2016-01-09 HISTORY — PX: ESOPHAGOGASTRODUODENOSCOPY: SHX5428

## 2016-01-09 LAB — BASIC METABOLIC PANEL
Anion gap: 15 (ref 5–15)
BUN: 69 mg/dL — AB (ref 6–20)
CHLORIDE: 105 mmol/L (ref 101–111)
CO2: 20 mmol/L — AB (ref 22–32)
CREATININE: 6.38 mg/dL — AB (ref 0.61–1.24)
Calcium: 8.7 mg/dL — ABNORMAL LOW (ref 8.9–10.3)
GFR calc non Af Amer: 7 mL/min — ABNORMAL LOW (ref >60)
GFR, EST AFRICAN AMERICAN: 8 mL/min — AB (ref >60)
GLUCOSE: 92 mg/dL (ref 65–99)
Potassium: 5.6 mmol/L — ABNORMAL HIGH (ref 3.5–5.1)
Sodium: 140 mmol/L (ref 135–145)

## 2016-01-09 LAB — POCT I-STAT, CHEM 8
BUN: 34 mg/dL — ABNORMAL HIGH (ref 6–20)
CHLORIDE: 103 mmol/L (ref 101–111)
Calcium, Ion: 0.93 mmol/L — ABNORMAL LOW (ref 1.13–1.30)
Creatinine, Ser: 3.1 mg/dL — ABNORMAL HIGH (ref 0.61–1.24)
Glucose, Bld: 76 mg/dL (ref 65–99)
HEMATOCRIT: 34 % — AB (ref 39.0–52.0)
HEMOGLOBIN: 11.6 g/dL — AB (ref 13.0–17.0)
POTASSIUM: 3.6 mmol/L (ref 3.5–5.1)
Sodium: 139 mmol/L (ref 135–145)
TCO2: 24 mmol/L (ref 0–100)

## 2016-01-09 LAB — PROTIME-INR
INR: 1.46 (ref 0.00–1.49)
Prothrombin Time: 17.8 seconds — ABNORMAL HIGH (ref 11.6–15.2)

## 2016-01-09 LAB — CBC
HCT: 21.9 % — ABNORMAL LOW (ref 39.0–52.0)
Hemoglobin: 6.8 g/dL — CL (ref 13.0–17.0)
MCH: 27.1 pg (ref 26.0–34.0)
MCHC: 31.1 g/dL (ref 30.0–36.0)
MCV: 87.3 fL (ref 78.0–100.0)
Platelets: 272 10*3/uL (ref 150–400)
RBC: 2.51 MIL/uL — AB (ref 4.22–5.81)
RDW: 21.8 % — ABNORMAL HIGH (ref 11.5–15.5)
WBC: 8.7 10*3/uL (ref 4.0–10.5)

## 2016-01-09 LAB — PREPARE RBC (CROSSMATCH)

## 2016-01-09 SURGERY — EGD (ESOPHAGOGASTRODUODENOSCOPY)
Anesthesia: Monitor Anesthesia Care | Laterality: Left

## 2016-01-09 MED ORDER — DOXERCALCIFEROL 4 MCG/2ML IV SOLN
INTRAVENOUS | Status: AC
  Administered 2016-01-09: 5 ug via INTRAVENOUS
  Filled 2016-01-09: qty 4

## 2016-01-09 MED ORDER — COLLAGENASE 250 UNIT/GM EX OINT
TOPICAL_OINTMENT | Freq: Every day | CUTANEOUS | Status: DC
  Administered 2016-01-09 – 2016-01-13 (×4): via TOPICAL
  Filled 2016-01-09: qty 30

## 2016-01-09 MED ORDER — LIDOCAINE-PRILOCAINE 2.5-2.5 % EX CREA
1.0000 "application " | TOPICAL_CREAM | CUTANEOUS | Status: DC | PRN
  Filled 2016-01-09: qty 5

## 2016-01-09 MED ORDER — SODIUM CHLORIDE 0.9 % IV SOLN: 100.0000 mL | INTRAVENOUS | Status: DC | PRN

## 2016-01-09 MED ORDER — BUTAMBEN-TETRACAINE-BENZOCAINE 2-2-14 % EX AERO
INHALATION_SPRAY | CUTANEOUS | Status: DC | PRN
  Administered 2016-01-09: 2 via TOPICAL

## 2016-01-09 MED ORDER — SODIUM CHLORIDE 0.9 % IV SOLN
Freq: Once | INTRAVENOUS | Status: AC
  Administered 2016-01-09: 14:00:00 via INTRAVENOUS

## 2016-01-09 MED ORDER — HEPARIN SODIUM (PORCINE) 1000 UNIT/ML DIALYSIS: 1000.0000 [IU] | INTRAMUSCULAR | Status: DC | PRN

## 2016-01-09 MED ORDER — PROPOFOL 10 MG/ML IV BOLUS
INTRAVENOUS | Status: DC | PRN
  Administered 2016-01-09: 10 mg via INTRAVENOUS

## 2016-01-09 MED ORDER — SODIUM CHLORIDE 0.9 % IV SOLN
INTRAVENOUS | Status: DC
  Administered 2016-01-09: 13:00:00 via INTRAVENOUS

## 2016-01-09 MED ORDER — CETYLPYRIDINIUM CHLORIDE 0.05 % MT LIQD
7.0000 mL | Freq: Two times a day (BID) | OROMUCOSAL | Status: DC
  Administered 2016-01-09 – 2016-01-13 (×7): 7 mL via OROMUCOSAL

## 2016-01-09 MED ORDER — SODIUM CHLORIDE 0.9 % IV BOLUS (SEPSIS): 500.0000 mL | Freq: Once | INTRAVENOUS | Status: DC

## 2016-01-09 MED ORDER — LIDOCAINE HCL (PF) 1 % IJ SOLN: 5.0000 mL | INTRAMUSCULAR | Status: DC | PRN

## 2016-01-09 MED ORDER — PHENYLEPHRINE HCL 10 MG/ML IJ SOLN
INTRAMUSCULAR | Status: DC | PRN
  Administered 2016-01-09 (×3): 80 ug via INTRAVENOUS

## 2016-01-09 MED ORDER — PROSIGHT PO TABS
1.0000 | ORAL_TABLET | Freq: Every day | ORAL | Status: DC
  Administered 2016-01-09 – 2016-01-11 (×3): 1 via ORAL
  Filled 2016-01-09 (×5): qty 1

## 2016-01-09 MED ORDER — SODIUM CHLORIDE 0.9 % IV SOLN: Freq: Once | INTRAVENOUS | Status: DC

## 2016-01-09 MED ORDER — ALTEPLASE 2 MG IJ SOLR: 2.0000 mg | Freq: Once | INTRAMUSCULAR | Status: DC | PRN

## 2016-01-09 MED ORDER — PENTAFLUOROPROP-TETRAFLUOROETH EX AERO: 1.0000 "application " | INHALATION_SPRAY | CUTANEOUS | Status: DC | PRN

## 2016-01-09 MED ORDER — LIDOCAINE HCL (CARDIAC) 20 MG/ML IV SOLN
INTRAVENOUS | Status: DC | PRN
  Administered 2016-01-09: 50 mg via INTRATRACHEAL

## 2016-01-09 MED ORDER — MIDODRINE HCL 5 MG PO TABS
ORAL_TABLET | ORAL | Status: AC
  Filled 2016-01-09: qty 2

## 2016-01-09 MED ORDER — PROPOFOL 500 MG/50ML IV EMUL
INTRAVENOUS | Status: DC | PRN
  Administered 2016-01-09: 40 ug/kg/min via INTRAVENOUS

## 2016-01-09 MED ORDER — SODIUM CHLORIDE 0.9 % IV SOLN
Freq: Once | INTRAVENOUS | Status: AC
  Administered 2016-01-12: 11:00:00 via INTRAVENOUS

## 2016-01-09 MED ORDER — DARBEPOETIN ALFA 200 MCG/0.4ML IJ SOSY
PREFILLED_SYRINGE | INTRAMUSCULAR | Status: AC
  Administered 2016-01-09: 200 ug via INTRAVENOUS
  Filled 2016-01-09: qty 0.4

## 2016-01-09 NOTE — Progress Notes (Signed)
[facility] Progress Note  Subjective: The patient continued to pass maroon stools overnight dropped his hemoglobin 7.4. He has been incontinent of 4 bloody bowel movements and 4 hours he has been on dialysis. He was transfused 3 units of packed red blood cells with a subsequent hemoglobin of 10.3. He was thus set up for endoscopy. See report below.  Objective: Vital signs in last 24 hours: Temp:  [97.5 F (36.4 C)-98.1 F (36.7 C)] 97.9 F (36.6 C) (03/17 0944) Pulse Rate:  [57-91] 69 (03/17 1000) Resp:  [12-21] 19 (03/17 1000) BP: (53-107)/(33-55) 78/39 mmHg (03/17 1000) SpO2:  [99 %-100 %] 100 % (03/17 1000) Weight:  [65.5 kg (144 lb 6.4 oz)-66 kg (145 lb 8.1 oz)] 66 kg (145 lb 8.1 oz) (03/17 0700) Weight change:    PE: Abdomen soft patient is alert  Lab Results: Results for orders placed or performed during the hospital encounter of 01/08/16 (from the past 24 hour(s))  Protime-INR     Status: Abnormal   Collection Time: 01/08/16  1:46 PM  Result Value Ref Range   Prothrombin Time 18.6 (H) 11.6 - 15.2 seconds   INR 1.55 (H) 0.00 - 1.49  MRSA PCR Screening     Status: None   Collection Time: 01/08/16  6:14 PM  Result Value Ref Range   MRSA by PCR NEGATIVE NEGATIVE  CBC     Status: Abnormal   Collection Time: 01/08/16  8:15 PM  Result Value Ref Range   WBC 9.6 4.0 - 10.5 K/uL   RBC 2.83 (L) 4.22 - 5.81 MIL/uL   Hemoglobin 7.8 (L) 13.0 - 17.0 g/dL   HCT 24.3 (L) 39.0 - 52.0 %   MCV 85.9 78.0 - 100.0 fL   MCH 27.6 26.0 - 34.0 pg   MCHC 32.1 30.0 - 36.0 g/dL   RDW 21.7 (H) 11.5 - 15.5 %   Platelets 215 150 - 400 K/uL  Protime-INR     Status: Abnormal   Collection Time: 01/08/16 11:25 PM  Result Value Ref Range   Prothrombin Time 17.2 (H) 11.6 - 15.2 seconds   INR 1.39 0.00 - 99991111  Basic metabolic panel     Status: Abnormal   Collection Time: 01/09/16  3:02 AM  Result Value Ref Range   Sodium 140 135 - 145 mmol/L   Potassium 5.6 (H) 3.5 - 5.1 mmol/L   Chloride 105 101 - 111 mmol/L   CO2 20 (L) 22 - 32 mmol/L   Glucose, Bld 92 65 - 99 mg/dL   BUN 69 (H) 6 - 20 mg/dL   Creatinine, Ser 6.38 (H) 0.61 - 1.24 mg/dL   Calcium 8.7 (L) 8.9 - 10.3 mg/dL   GFR calc non Af Amer 7 (L) >60 mL/min   GFR calc Af Amer 8 (L) >60 mL/min   Anion gap 15 5 - 15  Protime-INR     Status: Abnormal   Collection Time: 01/09/16  3:02 AM  Result Value Ref Range   Prothrombin Time 17.8 (H) 11.6 - 15.2 seconds   INR 1.46 0.00 - 1.49  CBC     Status: Abnormal   Collection Time: 01/09/16  3:09 AM  Result Value Ref Range   WBC 8.7 4.0 - 10.5 K/uL   RBC 2.51 (L) 4.22 - 5.81 MIL/uL   Hemoglobin 6.8 (LL) 13.0 - 17.0 g/dL   HCT 21.9 (L) 39.0 - 52.0 %   MCV 87.3 78.0 - 100.0 fL   MCH 27.1 26.0 - 34.0 pg  MCHC 31.1 30.0 - 36.0 g/dL   RDW 21.8 (H) 11.5 - 15.5 %   Platelets 272 150 - 400 K/uL  Prepare RBC     Status: None   Collection Time: 01/09/16  3:47 AM  Result Value Ref Range   Order Confirmation ORDER PROCESSED BY BLOOD BANK   Prepare RBC     Status: None   Collection Time: 01/09/16  8:24 AM  Result Value Ref Range   Order Confirmation      ORDER PROCESSED BY BLOOD BANK BLOOD ALREADY AVAILABLE  Prepare RBC     Status: None   Collection Time: 01/09/16  8:44 AM  Result Value Ref Range   Order Confirmation ORDER PROCESSED BY BLOOD BANK     Studies/Results: No results found.    Assessment: GI bleeding, source unclear EGD showed fairly severe distal erosive esophagitis with no obvious bleeding stigma. His stomach was about 3 forceful of food, with no bloody appearance but only part of the proximal third of the stomach could be examined. It was not felt safe to proceed.  Plan: GI bleeding source presumably from distal to the stomach and probably duodenum exact source unclear. Retained food in stomach limiting and unexplained with patient clearly nothing by mouth since yesterday. He does not have any symptoms of vomiting early satiety from discussing  with his wife. We'll continue to monitor stools and hemoglobin and if continues to bleed requiring transfusion next step would be a pooled RBC scan or CT angiogram followed by angiography if positive. Will keep on clear liquids for now. May yet indeed need another repeat endoscopy if upper GI tract source of bleeding suspected.    Toini Failla C 01/09/2016, 10:31 AM  Pager [phone] If no answer or after 5 PM call [phone]

## 2016-01-09 NOTE — Progress Notes (Signed)
Pt is a 82 y.o. yo male who was admitted on 01/08/2016 with G.I. bleed on Coumadin Assessment/Plan: 1. GI bleed- Needs blood transfusion on dialysis 2. ESRD - Normally Monday, Wednesday Friday at [facility]. Has a fistula.  3. Anemia- Clinically having G.I. bleed.  Received Mircera on 3/1-  4. Secondary hyperparathyroidism- Continue Hectorol 5. HTN/volume-low blood pressure with GIB 6. PVD and foot wound- is not resolved- is at high risk for amputation   Low BP on HD due to active GI bleed.  Will support with blood. Will give 3 units PRBCs for Hgb 6.8 plus active bleeding. [name] C

## 2016-01-09 NOTE — Consult Note (Addendum)
WOC wound consult note Pt is familiar to [place] from recent admission; refer to progress notes on 3/7.  Attempted to assess patient several times today; he was in dialysis all morning, then left to go for endoscopy throughout the afternoon. Unable to assess wounds until Mon.  Reason for Consult: Consult requested for left heel, right hip, sacrum and left ankle. Pt has been followed by Dr Jacqualyn Posey of the podiatry service in the past for an unstageable pressure injury to left heel and has been using Santyl for chemical debridement.  According to previous progress notes, pt has: Left heel with unstageable pressure injury Left outer ankle/achilies area with full thickness wound;  Pressure Ulcer POA: Yes Dressing procedure/placement/frequency: Continue present plan of care with Santyl for chemical debridement, which has already been ordered for left heel and left outer Achilles wound. Float heel to reduce pressure.Foam dressing to hips and sacrum until consult is performed for assessment. Measurements, and staging.  EMR states foot wound has declined. Pt was due for a revascularization which he did not receive. Please consult Vascular team for further plan of care.  Julien Girt MSN, RN, [place], [place], [place]

## 2016-01-09 NOTE — Transfer of Care (Signed)
Immediate Anesthesia Transfer of Care Note  Patient: [name]  Procedure(s) Performed: Procedure(s): ESOPHAGOGASTRODUODENOSCOPY (EGD) (Left)  Patient Location: PACU and Endoscopy Unit  Anesthesia Type:MAC  Level of Consciousness: awake, oriented and patient cooperative  Airway & Oxygen Therapy: Patient Spontanous Breathing  Post-op Assessment: Report given to RN  Post vital signs: Reviewed  Last Vitals:  Filed Vitals:   01/09/16 1450 01/09/16 1500  BP: 87/32 93/33  Pulse: 70 70  Temp:    Resp: 18 20    Complications: No apparent anesthesia complications

## 2016-01-09 NOTE — Progress Notes (Signed)
[facility] PROGRESS NOTE  [name] [id number] DOB: May 09, 1930 DOA: 01/08/2016 PCP: [name], MD  Length of stay: 1   Assessment/Plan: Active Problems:   Chronic systolic heart failure (HCC)   End stage renal disease ([place])   Supratherapeutic INR   Cardiomyopathy, ischemic   Open wound of left heel   PAF (paroxysmal atrial fibrillation) (HCC)   Essential hypertension   History of GI bleed   Pressure ulcer   Lower GI bleed   GI bleed  Brief summary 82 y.o. male with pmh of ESRD on HD m/w/f, afib on chronic coumadin, CAD, HTN, HLD, ICM/chronic systolic HF (s/p ICD placement), chronic anemia, GIB in setting of supratherapeutic INR in 03/2015, PAD with LE ulcer. Pt was hospitalized 12/25/25 through 01/05/16 for planned revascularization after lower extremity dopplers showes occluded stent. During this hospitalization, pt's Coumadin held, INR reversed. However, pt ended up refusing procedure and was restarted on Coumadin with heparin bridge and d/c'd SNF when INR therapeutic. Pt has been back at SNF for 3 days. Presented with approximately 4 dark stools. ED nurse reports another large, melenic stool on arrival to ED. Pt describes some abdominal cramping just prior to BM, but otherwise no abdominal pain, n/v. Denies recent chest pain, sob, headache or dizziness. Hemoglobin dropped from 11->6.8. GI consulted      Assessment and plan GI bleed in setting of supratherapeutic INR -hx of same in 03/2015 at which time pt underwent upper and lower endoscopy with no source of bleeding found. SBP in 90's but this seems baseline for this pt -hold Coumadin and Plavix -q8h CBCs to monitor need for transfusion -PPI. Clear liquid , EGD showed retained food , presumably duodenal in origin Patient seen by Dr. Alfonso Ellis recommended pool RBC scan Hemoglobin dropped overnight export 8 and patient receiving 3 units of packed red blood cells with dialysis  Hx afib on chronic  coumadin/supratherapeutic INR -currently NSR -hold Coumadin, give vit K x 1 dose in setting of active bleeding -monitor INR -pt is not a great Coumadin candidate given high fall risk and hx of GIB.   Chronic systolic HF/ICM -volume stable on admit. Last Echo 06/2015 EF 20-25%) -monitor  Anemia, chronic dz + GI loss -Hgb 6.8 from 11.0 on 3/13 -q8h CBCs to determine need for transfusion  Hx hypertension -SBP seems to run around 90s -will continue home BB for now with holding parameters. Monitor  ESRD on HD -last dialysis 3/15. Volume stable on admit -notify renal of admit  PVD with L heal ulcer -refused revascularization during recent admit -wound care  Sacral ulcer -wounld care  DVT prophylaxsis  SCDs   Code Status:      Code Status Orders        Start     Ordered   01/08/16 1833  Full code   Continuous     01/08/16 1832     Family Communication: Discussed in detail with the patient, all imaging results, lab results explained to the patient   Disposition Plan:   Continue to follow for GI bleeding, pending further recommendations from gastroenterology    Consultants:   gastroenterology  Nephrology   procedure none  Antibiotics: Anti-infectives    None         HPI/Subjective: Maroon stools overnight   Objective: Filed Vitals:   01/09/16 0930 01/09/16 0944 01/09/16 1000 01/09/16 1030  BP: 81/40 90/44 78/39  81/46  Pulse: 69 69 69 69  Temp: 97.9  F (36.6 C) 97.9 F (36.6 C)    TempSrc:  Axillary    Resp: 21  19   Height:      Weight:      SpO2: 100% 100% 100%     Intake/Output Summary (Last 24 hours) at 01/09/16 1059 Last data filed at 01/09/16 0944  Gross per 24 hour  Intake   2485 ml  Output      2 ml  Net   2483 ml    Exam:  General: No acute respiratory distress Lungs: Clear to auscultation bilaterally without wheezes or crackles Cardiovascular: Regular rate and rhythm without murmur gallop or rub normal S1 and S2 Abdomen:  Nontender, nondistended, soft, bowel sounds positive, no rebound, no ascites, no appreciable mass Extremities: No significant cyanosis, clubbing, or edema bilateral lower extremities     Data Review   Micro Results Recent Results (from the past 240 hour(s))  MRSA PCR Screening     Status: None   Collection Time: 01/08/16  6:14 PM  Result Value Ref Range Status   MRSA by PCR NEGATIVE NEGATIVE Final    Comment:        The GeneXpert MRSA Assay (FDA approved for NASAL specimens only), is one component of a comprehensive MRSA colonization surveillance program. It is not intended to diagnose MRSA infection nor to guide or monitor treatment for MRSA infections.     Radiology Reports Ct Head Wo Contrast  12/29/2015  CLINICAL DATA:  Recent CVA.  Confusion and cognitive decline. EXAM: CT HEAD WITHOUT CONTRAST TECHNIQUE: Contiguous axial images were obtained from the base of the skull through the vertex without intravenous contrast. COMPARISON:  CT head without contrast 11/25/2015. FINDINGS: Moderate generalized atrophy and white matter disease is stable. No acute cortical infarct, hemorrhage, or mass lesion is evident. The ventricles are proportionate to the degree of atrophy. No significant extra-axial fluid collection is present. Atherosclerotic calcifications are present in the cavernous internal carotid arteries, extending to the proximal right M1 segment. Atherosclerotic calcifications are noted at the dural margin of both vertebral arteries as well. The calvarium is intact. The paranasal sinuses and mastoid air cells are clear. No significant extracranial soft tissue lesions are evident. IMPRESSION: 1. Stable atrophy and white matter disease. This likely reflects the sequela of chronic microvascular ischemia. 2. Atherosclerosis. 3. No acute intracranial abnormality or significant interval change. Electronically Signed   By: San Morelle M.D.   On: 12/29/2015 12:07     CBC  Recent  Labs Lab 01/04/16 0442 01/05/16 0310 01/08/16 0934 01/08/16 0947 01/08/16 2015 01/09/16 0309  WBC 7.3 6.4 8.2  --  9.6 8.7  HGB 11.2* 11.0* 8.9* 10.9* 7.8* 6.8*  HCT 36.1* 35.6* 29.1* 32.0* 24.3* 21.9*  PLT 163 171 240  --  215 272  MCV 85.1 85.6 85.6  --  85.9 87.3  MCH 26.4 26.4 26.2  --  27.6 27.1  MCHC 31.0 30.9 30.6  --  32.1 31.1  RDW 22.1* 21.8* 21.3*  --  21.7* 21.8*  LYMPHSABS  --   --  0.9  --   --   --   MONOABS  --   --  1.2*  --   --   --   EOSABS  --   --  0.1  --   --   --   BASOSABS  --   --  0.0  --   --   --     Chemistries   Recent Labs Lab 01/05/16  0310 01/08/16 0934 01/08/16 0947 01/09/16 0302  NA 137 140 138 140  K 3.6 4.8 4.5 5.6*  CL 98* 102 99* 105  CO2 28 24  --  20*  GLUCOSE 98 87 82 92  BUN 34* 51* 48* 69*  CREATININE 5.60* 6.10* 6.00* 6.38*  CALCIUM 9.5 9.1  --  8.7*  AST  --  30  --   --   ALT  --  23  --   --   ALKPHOS  --  104  --   --   BILITOT  --  1.2  --   --    ------------------------------------------------------------------------------------------------------------------ estimated creatinine clearance is 8.2 mL/min (by C-G formula based on Cr of 6.38). ------------------------------------------------------------------------------------------------------------------ No results for input(s): HGBA1C in the last 72 hours. ------------------------------------------------------------------------------------------------------------------ No results for input(s): CHOL, HDL, LDLCALC, TRIG, CHOLHDL, LDLDIRECT in the last 72 hours. ------------------------------------------------------------------------------------------------------------------ No results for input(s): TSH, T4TOTAL, T3FREE, THYROIDAB in the last 72 hours.  Invalid input(s): FREET3 ------------------------------------------------------------------------------------------------------------------  Recent Labs  01/08/16 0934  VITAMINB12 1578*  FOLATE 14.9  FERRITIN  824*  TIBC NOT CALCULATED  IRON 41*  RETICCTPCT 2.4    Coagulation profile  Recent Labs Lab 01/05/16 0310 01/08/16 0934 01/08/16 1346 01/08/16 2325 01/09/16 0302  INR 2.24* 3.53* 1.55* 1.39 1.46    No results for input(s): DDIMER in the last 72 hours.  Cardiac Enzymes No results for input(s): CKMB, TROPONINI, MYOGLOBIN in the last 168 hours.  Invalid input(s): CK ------------------------------------------------------------------------------------------------------------------ Invalid input(s): POCBNP   CBG: No results for input(s): GLUCAP in the last 168 hours.     Studies: No results found.    Lab Results  Component Value Date   HGBA1C  10/02/2007    5.8 (NOTE)   The ADA recommends the following therapeutic goals for glycemic   control related to Hgb A1C measurement:   Goal of Therapy:   < 7.0% Hgb A1C   Action Suggested:  > 8.0% Hgb A1C   Ref:  Diabetes Care, 22, Suppl. 1, 1999   Lab Results  Component Value Date   LDLCALC 41 12/16/2015   CREATININE 6.38* 01/09/2016       Scheduled Meds: . sodium chloride   Intravenous Once  . sodium chloride   Intravenous Once  . sodium chloride   Intravenous Once  . allopurinol  300 mg Oral Daily  . antiseptic oral rinse  7 mL Mouth Rinse BID  . atorvastatin  40 mg Oral q1800  . carvedilol  3.125 mg Oral BID WC  . Darbepoetin Alfa      . darbepoetin (ARANESP) injection - DIALYSIS  200 mcg Intravenous Q Fri-HD  . doxercalciferol      . doxercalciferol  5 mcg Intravenous Q M,W,F-HD  . DULoxetine  30 mg Oral Daily  . feeding supplement (NEPRO CARB STEADY)  237 mL Oral BID BM  . midodrine  10 mg Oral Q M,W,F  . multivitamin  1 tablet Oral Daily  . pantoprazole (PROTONIX) IV  40 mg Intravenous Q12H  . sodium chloride flush  3 mL Intravenous Q12H  . sodium chloride flush  3 mL Intravenous Q12H   Continuous Infusions:   Active Problems:   Chronic systolic heart failure (HCC)   End stage renal disease (HCC)    Supratherapeutic INR   Cardiomyopathy, ischemic   Open wound of left heel   PAF (paroxysmal atrial fibrillation) (HCC)   Essential hypertension   History of GI bleed   Pressure ulcer   Lower GI  bleed   GI bleed    Time spent: 45 minutes   [facility] Pager [phone]. If 7PM-7AM, please contact night-coverage at [email or web address], password [facility] 01/09/2016, 10:59 AM  LOS: 1 day

## 2016-01-09 NOTE — Anesthesia Preprocedure Evaluation (Addendum)
Anesthesia Evaluation  Patient identified by MRN, date of birth, ID band Patient awake    Reviewed: Allergy & Precautions, NPO status , Patient's Chart, lab work & pertinent test results  Airway Mallampati: II   Neck ROM: full    Dental   Pulmonary shortness of breath, former smoker,    breath sounds clear to auscultation       Cardiovascular hypertension, + CAD, + Past MI, + Peripheral Vascular Disease and +CHF  + dysrhythmias Atrial Fibrillation + Cardiac Defibrillator  Rhythm:irregular Rate:Normal  Pacer/AICD interrogation 10/2015 Patient bi-ventricularly pacing 99.9% of the time, 6ys longevity  Echo 06/2015 - Left ventricle: The cavity size was mildly dilated. Systolic  function was severely reduced. The estimated ejection fraction was in the range of 20% to 25%. There is akinesis of the entireanteroseptal myocardium. There is moderate hypokinesis of the entireinferolateral myocardium. Doppler parameters are consistent with abnormal left ventricular relaxation (grade 1 diastolic dysfunction). - Ventricular septum: Septal motion showed paradox. - Aortic valve: Transvalvular velocity was minimally increased, due to stenosis. There was very mild stenosis. There was mild regurgitation. - Mitral valve: There was mild regurgitation. - Left atrium: The atrium was moderately to severely dilated. - Right ventricle: Systolic function was mildly to moderately reduced. - Tricuspid valve: There was moderate regurgitation. - Pulmonary arteries: Systolic pressure was moderately increased. PA peak pressure: 50 mm Hg (S).   Neuro/Psych    GI/Hepatic   Endo/Other  Hypothyroidism   Renal/GU Renal InsufficiencyRenal disease     Musculoskeletal  (+) Arthritis ,   Abdominal   Peds  Hematology  (+) anemia ,   Anesthesia Other Findings   Reproductive/Obstetrics                          Anesthesia  Physical  Anesthesia Plan  ASA: IV  Anesthesia Plan: MAC   Post-op Pain Management:    Induction: Intravenous  Airway Management Planned: Simple Face Mask  Additional Equipment:   Intra-op Plan:   Post-operative Plan:   Informed Consent: I have reviewed the patients History and Physical, chart, labs and discussed the procedure including the risks, benefits and alternatives for the proposed anesthesia with the patient or authorized representative who has indicated his/her understanding and acceptance.     Plan Discussed with: CRNA, Anesthesiologist and Surgeon  Anesthesia Plan Comments:         Anesthesia Quick Evaluation

## 2016-01-09 NOTE — Progress Notes (Signed)
CRITICAL VALUE ALERT  Critical value received:  Hemoglobin 6.8  Date of notification:  01/09/2016  Time of notification:  0331  Critical value read back:Yes.    Nurse who received alert:  Johnsie Cancel  MD notified (1st page):  [place], MD, [facility]  Time of first page:  [phone]  MD notified (2nd page):  Time of second page:  Responding MD:  Harvest Forest  Time MD responded:  [phone]

## 2016-01-09 NOTE — Op Note (Signed)
[facility] Patient Name: [name] Procedure Date : 01/09/2016 MRN: [id number] Attending MD: Missy Sabins , MD Date of Birth: 1930/09/27 CSN: [id number] Age: 82 Admit Type: Inpatient Procedure:                Upper GI endoscopy Indications:              Hematochezia Providers:                Elyse Jarvis. Amedeo Plenty, MD, Kingsley Plan, RN, Despina Pole, Technician Referring MD:              Medicines:                Propofol per Anesthesia Complications:            No immediate complications. Estimated Blood Loss:     Estimated blood loss: none. Procedure:                Pre-Anesthesia Assessment:                           - Prior to the procedure, a History and Physical                            was performed, and patient medications and                            allergies were reviewed. The patient's tolerance of                            previous anesthesia was also reviewed. The risks                            and benefits of the procedure and the sedation                            options and risks were discussed with the patient.                            All questions were answered, and informed consent                            was obtained. Prior Anticoagulants: The patient has                            taken no previous anticoagulant or antiplatelet                            agents. ASA Grade Assessment: III - A patient with                            severe systemic disease. After reviewing the risks  and benefits, the patient was deemed in                            satisfactory condition to undergo the procedure.                           After obtaining informed consent, the endoscope was                            passed under direct vision. Throughout the                            procedure, the patient's blood pressure, pulse, and                            oxygen saturations were monitored  continuously. The                            [id number] scope was introduced through the                            mouth, and advanced to the body of the stomach. The                            upper GI endoscopy was accomplished without                            difficulty. The patient tolerated the procedure                            well. Scope In: Scope Out: Findings:      LA Grade B (one or more mucosal breaks greater than 5 mm, not extending       between the tops of two mucosal folds) esophagitis with no bleeding was       found 34 to 38 cm from the incisors.      A large amount of food (residue) was found in the gastric fundus, in the       gastric body and in the gastric antrum.      The exam of the stomach was otherwise normal.      There is no endoscopic evidence of bleeding in the entire examined       stomach. Impression:               - LA Grade B reflux esophagitis.                           - A large amount of food (residue) in the stomach.                           - No specimens collected. Moderate Sedation:      no moderate sedation Recommendation:           - Return patient to hospital ward for ongoing care.                           -  Clear liquid diet.                           - Continue present medications. Procedure Code(s):        --- Professional ---                           [phone], 20, Esophagogastroduodenoscopy, flexible,                            transoral; diagnostic, including collection of                            specimen(s) by brushing or washing, when performed                            (separate procedure) Diagnosis Code(s):        --- Professional ---                           K21.0, Gastro-esophageal reflux disease with                            esophagitis                           K92.1, Melena (includes Hematochezia) CPT copyright 2016 [employer]. All rights reserved. The codes documented in this report are  preliminary and upon coder review may  be revised to meet current compliance requirements. Teena Irani, MD Missy Sabins, MD 01/09/2016 3:10:23 PM This report has been signed electronically. Number of Addenda: 0

## 2016-01-10 ENCOUNTER — Encounter (HOSPITAL_COMMUNITY): Payer: Self-pay | Admitting: Gastroenterology

## 2016-01-10 ENCOUNTER — Inpatient Hospital Stay (HOSPITAL_COMMUNITY): Payer: Medicare Other

## 2016-01-10 LAB — CBC
HCT: 30.3 % — ABNORMAL LOW (ref 39.0–52.0)
Hemoglobin: 9.6 g/dL — ABNORMAL LOW (ref 13.0–17.0)
MCH: 26.3 pg (ref 26.0–34.0)
MCHC: 31.7 g/dL (ref 30.0–36.0)
MCV: 83 fL (ref 78.0–100.0)
PLATELETS: 143 10*3/uL — AB (ref 150–400)
RBC: 3.65 MIL/uL — ABNORMAL LOW (ref 4.22–5.81)
RDW: 18.6 % — AB (ref 11.5–15.5)
WBC: 16 10*3/uL — ABNORMAL HIGH (ref 4.0–10.5)

## 2016-01-10 LAB — COMPREHENSIVE METABOLIC PANEL
ALK PHOS: 101 U/L (ref 38–126)
ALT: 66 U/L — ABNORMAL HIGH (ref 17–63)
AST: 182 U/L — AB (ref 15–41)
Albumin: 1.7 g/dL — ABNORMAL LOW (ref 3.5–5.0)
Anion gap: 13 (ref 5–15)
BILIRUBIN TOTAL: 1.4 mg/dL — AB (ref 0.3–1.2)
BUN: 39 mg/dL — AB (ref 6–20)
CALCIUM: 8.4 mg/dL — AB (ref 8.9–10.3)
CO2: 26 mmol/L (ref 22–32)
Chloride: 103 mmol/L (ref 101–111)
Creatinine, Ser: 3.64 mg/dL — ABNORMAL HIGH (ref 0.61–1.24)
GFR calc Af Amer: 16 mL/min — ABNORMAL LOW (ref >60)
GFR calc non Af Amer: 14 mL/min — ABNORMAL LOW (ref >60)
GLUCOSE: 80 mg/dL (ref 65–99)
Potassium: 3.5 mmol/L (ref 3.5–5.1)
Sodium: 142 mmol/L (ref 135–145)
TOTAL PROTEIN: 4.7 g/dL — AB (ref 6.5–8.1)

## 2016-01-10 LAB — TYPE AND SCREEN
ABO/RH(D): O POS
Antibody Screen: NEGATIVE
UNIT DIVISION: 0
UNIT DIVISION: 0
Unit division: 0
Unit division: 0
Unit division: 0

## 2016-01-10 LAB — PROTIME-INR
INR: 1.4 (ref 0.00–1.49)
Prothrombin Time: 17.2 seconds — ABNORMAL HIGH (ref 11.6–15.2)

## 2016-01-10 MED ORDER — TECHNETIUM TC 99M-LABELED RED BLOOD CELLS IV KIT
25.5000 | PACK | Freq: Once | INTRAVENOUS | Status: AC | PRN
  Administered 2016-01-10: 26 via INTRAVENOUS

## 2016-01-10 NOTE — Progress Notes (Signed)
[facility] PROGRESS NOTE  [name] [id number] DOB: 02/23/1930 DOA: 01/08/2016 PCP: [name], MD  Length of stay: 2   Assessment/Plan: Active Problems:   Chronic systolic heart failure (HCC)   End stage renal disease ([place])   Supratherapeutic INR   Cardiomyopathy, ischemic   Open wound of left heel   PAF (paroxysmal atrial fibrillation) (HCC)   Essential hypertension   History of GI bleed   Pressure ulcer   Lower GI bleed   GI bleed    Brief summary 82 y.o. male with pmh of ESRD on HD m/w/f, afib on chronic coumadin, CAD, HTN, HLD, ICM/chronic systolic HF (s/p ICD placement), chronic anemia, GIB in setting of supratherapeutic INR in 03/2015, PAD with LE ulcer. Pt was hospitalized 12/25/25 through 01/05/16 for planned revascularization after lower extremity dopplers showes occluded stent. During this hospitalization, pt's Coumadin held, INR reversed. However, pt ended up refusing procedure and was restarted on Coumadin with heparin bridge and d/c'd SNF when INR therapeutic. Pt has been back at SNF for 3 days. Presented with approximately 4 dark stools. ED nurse reports another large, melenic stool on arrival to ED. Pt describes some abdominal cramping just prior to BM, but otherwise no abdominal pain, n/v. Denies recent chest pain, sob, headache or dizziness. Hemoglobin dropped from 11->6.8. GI consulted      Assessment and plan GI bleed in setting of supratherapeutic INR -hx of same in 03/2015 at which time pt underwent upper and lower endoscopy with no source of bleeding found. SBP in 90's but this seems baseline for this pt -hold Coumadin and Plavix -q8h CBCs to monitor need for transfusion -PPI. Clear liquid , EGD showed retained food , Bleeding from distal to the Stomach/duodenum Patient seen by Dr. Alfonso Ellis recommended pool RBC scan Hemoglobin dropped overnight 11.6> 9.6 without any ongoing bleeding Status post receiving 3 units of packed  red blood cells with dialysis On 3/17 Will obtain pooled RBC scan   Hx afib on chronic coumadin/supratherapeutic INR -currently NSR -hold Coumadin, give vit K x 1 dose in setting of active bleeding -monitor INR -pt is not a great Coumadin candidate given high fall risk and hx of GIB.   Chronic systolic HF/ICM -volume stable on admit. Last Echo 06/2015 EF 20-25%) -monitor  Anemia, chronic dz + GI loss -Hgb 6.8 from 11.0 on 3/13 -q8h CBCs to determine need for transfusion  Hx hypertension -SBP seems to run around 90s -will continue home BB for now with holding parameters. Monitor  ESRD on HD -last dialysis 3/15. Volume stable on admit -notify renal of admit  PVD with L heal ulcer -refused revascularization during recent admit -wound care  Sacral ulcer -wounld care  DVT prophylaxsis  SCDs   Code Status:      Code Status Orders        Start     Ordered   01/08/16 1833  Full code   Continuous     01/08/16 1832     Family Communication: Discussed in detail with the patient, all imaging results, lab results explained to the patient   Disposition Plan:   Continue to follow for GI bleeding, pending further recommendations from gastroenterology    Consultants:   gastroenterology  Nephrology   procedure none  Antibiotics: Anti-infectives    None         HPI/Subjective: Patient denies any melena overnight, not very communicative, Somewhat depressed  Objective: Filed Vitals:   01/10/16  0004 01/10/16 0311 01/10/16 0800 01/10/16 0809  BP: 104/45 101/48 102/47 102/47  Pulse:      Temp:  97.4 F (36.3 C)  97.7 F (36.5 C)  TempSrc:  Oral  Oral  Resp: 20 19 17 15   Height:      Weight:  66 kg (145 lb 8.1 oz)    SpO2:  96%  97%    Intake/Output Summary (Last 24 hours) at 01/10/16 [id number] Last data filed at 01/09/16 1130  Gross per 24 hour  Intake   1005 ml  Output  -1290 ml  Net   2295 ml    Exam:  General: No acute respiratory  distress Lungs: Clear to auscultation bilaterally without wheezes or crackles Cardiovascular: Regular rate and rhythm without murmur gallop or rub normal S1 and S2 Abdomen: Nontender, nondistended, soft, bowel sounds positive, no rebound, no ascites, no appreciable mass Extremities: No significant cyanosis, clubbing, or edema bilateral lower extremities     Data Review   Micro Results Recent Results (from the past 240 hour(s))  MRSA PCR Screening     Status: None   Collection Time: 01/08/16  6:14 PM  Result Value Ref Range Status   MRSA by PCR NEGATIVE NEGATIVE Final    Comment:        The GeneXpert MRSA Assay (FDA approved for NASAL specimens only), is one component of a comprehensive MRSA colonization surveillance program. It is not intended to diagnose MRSA infection nor to guide or monitor treatment for MRSA infections.     Radiology Reports Ct Head Wo Contrast  12/29/2015  CLINICAL DATA:  Recent CVA.  Confusion and cognitive decline. EXAM: CT HEAD WITHOUT CONTRAST TECHNIQUE: Contiguous axial images were obtained from the base of the skull through the vertex without intravenous contrast. COMPARISON:  CT head without contrast 11/25/2015. FINDINGS: Moderate generalized atrophy and white matter disease is stable. No acute cortical infarct, hemorrhage, or mass lesion is evident. The ventricles are proportionate to the degree of atrophy. No significant extra-axial fluid collection is present. Atherosclerotic calcifications are present in the cavernous internal carotid arteries, extending to the proximal right M1 segment. Atherosclerotic calcifications are noted at the dural margin of both vertebral arteries as well. The calvarium is intact. The paranasal sinuses and mastoid air cells are clear. No significant extracranial soft tissue lesions are evident. IMPRESSION: 1. Stable atrophy and white matter disease. This likely reflects the sequela of chronic microvascular ischemia. 2.  Atherosclerosis. 3. No acute intracranial abnormality or significant interval change. Electronically Signed   By: San Morelle M.D.   On: 12/29/2015 12:07     CBC  Recent Labs Lab 01/05/16 0310 01/08/16 0934 01/08/16 0947 01/08/16 2015 01/09/16 0309 01/09/16 1356 01/10/16 0104  WBC 6.4 8.2  --  9.6 8.7  --  16.0*  HGB 11.0* 8.9* 10.9* 7.8* 6.8* 11.6* 9.6*  HCT 35.6* 29.1* 32.0* 24.3* 21.9* 34.0* 30.3*  PLT 171 240  --  215 272  --  143*  MCV 85.6 85.6  --  85.9 87.3  --  83.0  MCH 26.4 26.2  --  27.6 27.1  --  26.3  MCHC 30.9 30.6  --  32.1 31.1  --  31.7  RDW 21.8* 21.3*  --  21.7* 21.8*  --  18.6*  LYMPHSABS  --  0.9  --   --   --   --   --   MONOABS  --  1.2*  --   --   --   --   --  EOSABS  --  0.1  --   --   --   --   --   BASOSABS  --  0.0  --   --   --   --   --     Chemistries   Recent Labs Lab 01/05/16 0310 01/08/16 0934 01/08/16 0947 01/09/16 0302 01/09/16 1356 01/10/16 0104  NA 137 140 138 140 139 142  K 3.6 4.8 4.5 5.6* 3.6 3.5  CL 98* 102 99* 105 103 103  CO2 28 24  --  20*  --  26  GLUCOSE 98 87 82 92 76 80  BUN 34* 51* 48* 69* 34* 39*  CREATININE 5.60* 6.10* 6.00* 6.38* 3.10* 3.64*  CALCIUM 9.5 9.1  --  8.7*  --  8.4*  AST  --  30  --   --   --  182*  ALT  --  23  --   --   --  66*  ALKPHOS  --  104  --   --   --  101  BILITOT  --  1.2  --   --   --  1.4*   ------------------------------------------------------------------------------------------------------------------ estimated creatinine clearance is 14.4 mL/min (by C-G formula based on Cr of 3.64). ------------------------------------------------------------------------------------------------------------------ No results for input(s): HGBA1C in the last 72 hours. ------------------------------------------------------------------------------------------------------------------ No results for input(s): CHOL, HDL, LDLCALC, TRIG, CHOLHDL, LDLDIRECT in the last 72  hours. ------------------------------------------------------------------------------------------------------------------ No results for input(s): TSH, T4TOTAL, T3FREE, THYROIDAB in the last 72 hours.  Invalid input(s): FREET3 ------------------------------------------------------------------------------------------------------------------  Recent Labs  01/08/16 0934  VITAMINB12 1578*  FOLATE 14.9  FERRITIN 824*  TIBC NOT CALCULATED  IRON 41*  RETICCTPCT 2.4    Coagulation profile  Recent Labs Lab 01/08/16 0934 01/08/16 1346 01/08/16 2325 01/09/16 0302 01/10/16 0104  INR 3.53* 1.55* 1.39 1.46 1.40    No results for input(s): DDIMER in the last 72 hours.  Cardiac Enzymes No results for input(s): CKMB, TROPONINI, MYOGLOBIN in the last 168 hours.  Invalid input(s): CK ------------------------------------------------------------------------------------------------------------------ Invalid input(s): POCBNP   CBG: No results for input(s): GLUCAP in the last 168 hours.     Studies: No results found.    Lab Results  Component Value Date   HGBA1C  10/02/2007    5.8 (NOTE)   The ADA recommends the following therapeutic goals for glycemic   control related to Hgb A1C measurement:   Goal of Therapy:   < 7.0% Hgb A1C   Action Suggested:  > 8.0% Hgb A1C   Ref:  Diabetes Care, 22, Suppl. 1, 1999   Lab Results  Component Value Date   LDLCALC 41 12/16/2015   CREATININE 3.64* 01/10/2016       Scheduled Meds: . sodium chloride   Intravenous Once  . sodium chloride   Intravenous Once  . allopurinol  300 mg Oral Daily  . antiseptic oral rinse  7 mL Mouth Rinse BID  . atorvastatin  40 mg Oral q1800  . carvedilol  3.125 mg Oral BID WC  . collagenase   Topical Daily  . darbepoetin (ARANESP) injection - DIALYSIS  200 mcg Intravenous Q Fri-HD  . doxercalciferol  5 mcg Intravenous Q M,W,F-HD  . DULoxetine  30 mg Oral Daily  . feeding supplement (NEPRO CARB STEADY)   237 mL Oral BID BM  . midodrine  10 mg Oral Q M,W,F  . multivitamin  1 tablet Oral Daily  . pantoprazole (PROTONIX) IV  40 mg Intravenous Q12H  . sodium chloride flush  3  mL Intravenous Q12H  . sodium chloride flush  3 mL Intravenous Q12H   Continuous Infusions:   Active Problems:   Chronic systolic heart failure (HCC)   End stage renal disease (HCC)   Supratherapeutic INR   Cardiomyopathy, ischemic   Open wound of left heel   PAF (paroxysmal atrial fibrillation) (HCC)   Essential hypertension   History of GI bleed   Pressure ulcer   Lower GI bleed   GI bleed    Time spent: 45 minutes   [facility] Pager [phone]. If 7PM-7AM, please contact night-coverage at [email or web address], password [facility] 01/10/2016, 8:52 AM  LOS: 2 days

## 2016-01-10 NOTE — Progress Notes (Signed)
Patient trasfered from [facility] to [phone] via bed; alert and oriented x 3, confused; no complaints of pain; IV saline locked in RFA. Orient patient to room and unit; instructed how to use the call bell and fall risk precautions. Will continue to monitor the patient.

## 2016-01-10 NOTE — Progress Notes (Signed)
Assessment/Plan: 1. GI bleed- Needs blood transfusion on dialysis 2. ESRD - Normally Monday, Wednesday Friday at [facility]. Has a fistula.  3. Anemia Received Mircera on 3/1-  4. Secondary hyperparathyroidism- Continue Hectorol 5. HTN/volume-low blood pressure with GIB 6. PVD and foot wound- is not resolved- is at high risk for amputation   Subjective: Interval History: Neg Nuc blood scan this am  Objective: Vital signs in last 24 hours: Temp:  [97.4 F (36.3 C)-98.6 F (37 C)] 97.7 F (36.5 C) (03/18 0809) Pulse Rate:  [69-70] 70 (03/17 1520) Resp:  [15-24] 15 (03/18 0809) BP: (87-104)/(32-48) 102/47 mmHg (03/18 0809) SpO2:  [95 %-100 %] 97 % (03/18 0809) Weight:  [66 kg (145 lb 8.1 oz)] 66 kg (145 lb 8.1 oz) (03/18 0311) Weight change: 0.5 kg (1 lb 1.6 oz)  Intake/Output from previous day: 03/17 0701 - 03/18 0700 In: 1005 [Blood:1005] Out: -1290  Intake/Output this shift:    General appearance: alert and cooperative Resp: clear to auscultation bilaterally GI: soft, non-tender; bowel sounds normal; no masses,  no organomegaly  Lab Results:  Recent Labs  01/09/16 0309 01/09/16 1356 01/10/16 0104  WBC 8.7  --  16.0*  HGB 6.8* 11.6* 9.6*  HCT 21.9* 34.0* 30.3*  PLT 272  --  143*   BMET:  Recent Labs  01/09/16 0302 01/09/16 1356 01/10/16 0104  NA 140 139 142  K 5.6* 3.6 3.5  CL 105 103 103  CO2 20*  --  26  GLUCOSE 92 76 80  BUN 69* 34* 39*  CREATININE 6.38* 3.10* 3.64*  CALCIUM 8.7*  --  8.4*   No results for input(s): PTH in the last 72 hours. Iron Studies:  Recent Labs  01/08/16 0934  IRON 41*  TIBC NOT CALCULATED  FERRITIN 824*   Studies/Results: Nm Gi Blood Loss  01/10/2016  CLINICAL DATA:  GI bleed, last active bleeding was last night. EXAM: NUCLEAR MEDICINE GASTROINTESTINAL BLEEDING SCAN TECHNIQUE: Sequential abdominal images were obtained following intravenous administration of Tc-67m labeled red blood cells. RADIOPHARMACEUTICALS:   25.5 mCi Tc-83m in-vitro labeled red cells. COMPARISON:  02/26/2005 CT abdomen/pelvis. FINDINGS: Some of the images are obscured by the patient's overlying left upper extremity. There is no convincing scintigraphic evidence of active GI bleeding during the 2 hour observation period. Curvilinear uptake in the midline lower portion of the images is most consistent with penile uptake. IMPRESSION: No scintigraphic evidence of active GI bleed. Electronically Signed   By: Ilona Sorrel M.D.   On: 01/10/2016 13:10   Scheduled: . sodium chloride   Intravenous Once  . sodium chloride   Intravenous Once  . allopurinol  300 mg Oral Daily  . antiseptic oral rinse  7 mL Mouth Rinse BID  . atorvastatin  40 mg Oral q1800  . carvedilol  3.125 mg Oral BID WC  . collagenase   Topical Daily  . darbepoetin (ARANESP) injection - DIALYSIS  200 mcg Intravenous Q Fri-HD  . doxercalciferol  5 mcg Intravenous Q M,W,F-HD  . DULoxetine  30 mg Oral Daily  . feeding supplement (NEPRO CARB STEADY)  237 mL Oral BID BM  . midodrine  10 mg Oral Q M,W,F  . multivitamin  1 tablet Oral Daily  . pantoprazole (PROTONIX) IV  40 mg Intravenous Q12H  . sodium chloride flush  3 mL Intravenous Q12H  . sodium chloride flush  3 mL Intravenous Q12H     LOS: 2 days   Elianny Buxbaum C 01/10/2016,1:50 PM

## 2016-01-10 NOTE — Progress Notes (Signed)
Patient down for tagged RBC when I went to see him.  Will revisit tomorrow, call me if needed sooner.

## 2016-01-11 LAB — COMPREHENSIVE METABOLIC PANEL
ALT: 42 U/L (ref 17–63)
AST: 55 U/L — AB (ref 15–41)
Albumin: 1.5 g/dL — ABNORMAL LOW (ref 3.5–5.0)
Alkaline Phosphatase: 88 U/L (ref 38–126)
Anion gap: 13 (ref 5–15)
BUN: 56 mg/dL — AB (ref 6–20)
CHLORIDE: 103 mmol/L (ref 101–111)
CO2: 24 mmol/L (ref 22–32)
Calcium: 8.8 mg/dL — ABNORMAL LOW (ref 8.9–10.3)
Creatinine, Ser: 4.86 mg/dL — ABNORMAL HIGH (ref 0.61–1.24)
GFR calc non Af Amer: 10 mL/min — ABNORMAL LOW (ref >60)
GFR, EST AFRICAN AMERICAN: 12 mL/min — AB (ref >60)
Glucose, Bld: 80 mg/dL (ref 65–99)
Potassium: 3.3 mmol/L — ABNORMAL LOW (ref 3.5–5.1)
SODIUM: 140 mmol/L (ref 135–145)
Total Bilirubin: 1.3 mg/dL — ABNORMAL HIGH (ref 0.3–1.2)
Total Protein: 4.5 g/dL — ABNORMAL LOW (ref 6.5–8.1)

## 2016-01-11 LAB — CBC
HCT: 27.5 % — ABNORMAL LOW (ref 39.0–52.0)
Hemoglobin: 8.9 g/dL — ABNORMAL LOW (ref 13.0–17.0)
MCH: 27.2 pg (ref 26.0–34.0)
MCHC: 32.4 g/dL (ref 30.0–36.0)
MCV: 84.1 fL (ref 78.0–100.0)
PLATELETS: 138 10*3/uL — AB (ref 150–400)
RBC: 3.27 MIL/uL — AB (ref 4.22–5.81)
RDW: 19.3 % — ABNORMAL HIGH (ref 11.5–15.5)
WBC: 9.5 10*3/uL (ref 4.0–10.5)

## 2016-01-11 MED ORDER — THIAMINE HCL 100 MG/ML IJ SOLN: 100.0000 mg | Freq: Every day | INTRAMUSCULAR | Status: DC

## 2016-01-11 MED ORDER — LORAZEPAM 2 MG/ML IJ SOLN: 1.0000 mg | Freq: Four times a day (QID) | INTRAMUSCULAR | Status: DC | PRN

## 2016-01-11 MED ORDER — LORAZEPAM 1 MG PO TABS: 1.0000 mg | ORAL_TABLET | Freq: Four times a day (QID) | ORAL | Status: DC | PRN

## 2016-01-11 MED ORDER — VITAMIN B-1 100 MG PO TABS
100.0000 mg | ORAL_TABLET | Freq: Every day | ORAL | Status: DC
  Administered 2016-01-11 – 2016-01-13 (×2): 100 mg via ORAL
  Filled 2016-01-11 (×2): qty 1

## 2016-01-11 MED ORDER — POTASSIUM CHLORIDE CRYS ER 20 MEQ PO TBCR
40.0000 meq | EXTENDED_RELEASE_TABLET | Freq: Once | ORAL | Status: AC
  Administered 2016-01-11: 40 meq via ORAL
  Filled 2016-01-11: qty 2

## 2016-01-11 MED ORDER — ADULT MULTIVITAMIN W/MINERALS CH
1.0000 | ORAL_TABLET | Freq: Every day | ORAL | Status: DC
  Administered 2016-01-13: 1 via ORAL
  Filled 2016-01-11: qty 1

## 2016-01-11 MED ORDER — FOLIC ACID 1 MG PO TABS
1.0000 mg | ORAL_TABLET | Freq: Every day | ORAL | Status: DC
  Administered 2016-01-11 – 2016-01-13 (×2): 1 mg via ORAL
  Filled 2016-01-11 (×2): qty 1

## 2016-01-11 NOTE — Progress Notes (Addendum)
[facility] PROGRESS NOTE  [name] [id number] DOB: 1929/11/13 DOA: 01/08/2016 PCP: [name], MD  Length of stay: 3   Assessment/Plan: Active Problems:   Chronic systolic heart failure (HCC)   End stage renal disease ([place])   Supratherapeutic INR   Cardiomyopathy, ischemic   Open wound of left heel   PAF (paroxysmal atrial fibrillation) (HCC)   Essential hypertension   History of GI bleed   Pressure ulcer   Lower GI bleed   GI bleed    Brief summary 82 y.o. male with pmh of ESRD on HD m/w/f, afib on chronic coumadin, CAD, HTN, HLD, ICM/chronic systolic HF (s/p ICD placement), chronic anemia, GIB in setting of supratherapeutic INR in 03/2015, PAD with LE ulcer. Pt was hospitalized 12/25/25 through 01/05/16 for planned revascularization after lower extremity dopplers showes occluded stent. During this hospitalization, pt's Coumadin held, INR reversed. However, pt ended up refusing procedure and was restarted on Coumadin with heparin bridge and d/c'd SNF when INR therapeutic. Pt has been back at SNF for 3 days. Presented with approximately 4 dark stools. ED nurse reports another large, melenic stool on arrival to ED. Pt describes some abdominal cramping just prior to BM, but otherwise no abdominal pain, n/v. Denies recent chest pain, sob, headache or dizziness. Hemoglobin dropped from 11->6.8. GI consulted    History prior to this admission [name] is a 82 y/o male, with h/o ischemic cardiomyopathy s/p AICD, followed by Dr. Lovena Le. He also has a h/o chronic atrial fibrillation, on chronic coumadin and h/o ESRD on HD. He was recently referred to Dr. Gwenlyn Found by Dr. Earleen Newport, his podiatrist, for critical limb ischemia. He has an ulcer on his left heel which has been present for the last month. There is no history of claudication. He had Dopplers in our office 12/05/15 revealing occluded tibials bilaterally.   He was initially admitted to [facility] on 12/15/15 for planned  PV angio +/- intervention. He underwent PTA + stenting of the L proximal peroneal artery. Postprocedure, he was placed on Coumadin and Plavix without aspirin to lessen the bleeding risk. Nephrology was consulted to help manage his dialysis. He was discharged home in stable condition on 12/26/15 to SNF.   He was seen back in clinic for f/u lower arterial dopplers + ABIs 12/25/15. This showed an occluded stent. He was readmitted 12/26/15 to transition off Coumadin in preparation for another attempt at revascularization.  His coumadin was held and IV heparin was started once INR was <2.0. PV angio was delayed further as patient developed acute delirium/confusion. Neurology was consulted. EEG obtained at time of confusion was negative. CT was negative. It was felt to be acute delirium/ sundowning/ probable component of dementia. Nephrology cleared him for repeat PV angio, which was rescheduled 12/31/15. This was scheduled with Dr. Fletcher Anon, however it was noted that the patient, "communicated that he did not want to have the procedure done". Dr. Fletcher Anon discussed the procedure with him and the indications in detail. He said that he was "tired of having procedures done and wants to be left alone". It was explained to him the risk of amputation. He mentioned that he understood and he was okay with that (see Dr. Tyrell Antonio Progress Note from 12/31/15 outlining conversation). It was also felt by Dr. Fletcher Anon and Dr. Gwenlyn Found that his leg might not be salvageable regardless of intervention. It was felt that BKA might be the right option for him if his leg does not  heal. His procedure was canceled. Continued wound care was recommended       Assessment and plan GI bleed in setting of supratherapeutic INR -hx of same in 03/2015 at which time pt underwent upper and lower endoscopy with no source of bleeding found. SBP in 90's but this seems baseline for this pt Holding  Coumadin and Plavix Reassess need for transfusion tomorrow -PPI. Clear  liquid , EGD showed retained food , Bleeding from distal to the Stomach/duodenum Patient seen by Dr. Alfonso Ellis recommended pool RBC scan Which was negative Hemoglobin dropped overnight 11.6> 9.6 > 8.9 without any ongoing bleeding Status post receiving 3 units of packed red blood cells with dialysis On 3/17 NPO after midnight for possible repeat endoscopy in the morning   Hx afib on chronic coumadin/supratherapeutic INR -currently NSR Held  Coumadin, give vit K x 1 dose in setting of active bleeding -monitor INR -pt is not a great Coumadin candidate given high fall risk and hx of GIB.   Chronic systolic HF/ICM -volume stable on admit. Last Echo 06/2015 EF 20-25%) -monitor  Anemia, chronic dz + GI loss -Hgb 6.8 from 11.0 on 3/13 -q8h CBCs to determine need for transfusion  Hx hypertension -SBP seems to run around 90s -will continue home BB for now with holding parameters. Monitor  ESRD on HD -last dialysis 3/15. Volume stable on admit -notify renal of admit  PVD with L heal ulcer -refused revascularization during recent admit Not a candidate for invasive workup, Patient has himself refused amputation during his last admission Of anticoagulation and Plavix due to GI bleeding   increased risk of sepsis, risk of amputation, ischemic limb, palliative care consultation has been requested  Sacral ulcer -wounld care  DVT prophylaxsis  SCDs   Code Status:      Code Status Orders        Start     Ordered   01/08/16 1833  Full code   Continuous     01/08/16 1832     Family Communication: Discussed in detail with the patient, all imaging results, lab results explained to the patient   Disposition Plan:   Palliative care consultation requested for goals of care    Consultants:   gastroenterology  Nephrology  Palliative care   procedure none  Antibiotics: Anti-infectives    None         HPI/Subjective: Patient denies any melena overnight, not very  communicative, Somewhat depressed, Does not want any procedures done  Objective: Filed Vitals:   01/10/16 1541 01/10/16 2136 01/11/16 0549 01/11/16 1041  BP: 92/39 91/40 86/40  93/44  Pulse: 70 71 70 70  Temp: 98.1 F (36.7 C) 98 F (36.7 C) 97.8 F (36.6 C)   TempSrc:  Oral Oral   Resp: 17 18 16    Height:      Weight:   69.3 kg (152 lb 12.5 oz)   SpO2: 100% 100% 99% 100%    Intake/Output Summary (Last 24 hours) at 01/11/16 1128 Last data filed at 01/11/16 0600  Gross per 24 hour  Intake     30 ml  Output      0 ml  Net     30 ml    Exam:  General: No acute respiratory distress Lungs: Clear to auscultation bilaterally without wheezes or crackles Cardiovascular: Regular rate and rhythm without murmur gallop or rub normal S1 and S2 Abdomen: Nontender, nondistended, soft, bowel sounds positive, no rebound, no ascites, no appreciable mass Extremities: No significant cyanosis,  clubbing, or edema bilateral lower extremities     Data Review   Micro Results Recent Results (from the past 240 hour(s))  MRSA PCR Screening     Status: None   Collection Time: 01/08/16  6:14 PM  Result Value Ref Range Status   MRSA by PCR NEGATIVE NEGATIVE Final    Comment:        The GeneXpert MRSA Assay (FDA approved for NASAL specimens only), is one component of a comprehensive MRSA colonization surveillance program. It is not intended to diagnose MRSA infection nor to guide or monitor treatment for MRSA infections.     Radiology Reports Ct Head Wo Contrast  12/29/2015  CLINICAL DATA:  Recent CVA.  Confusion and cognitive decline. EXAM: CT HEAD WITHOUT CONTRAST TECHNIQUE: Contiguous axial images were obtained from the base of the skull through the vertex without intravenous contrast. COMPARISON:  CT head without contrast 11/25/2015. FINDINGS: Moderate generalized atrophy and white matter disease is stable. No acute cortical infarct, hemorrhage, or mass lesion is evident. The ventricles  are proportionate to the degree of atrophy. No significant extra-axial fluid collection is present. Atherosclerotic calcifications are present in the cavernous internal carotid arteries, extending to the proximal right M1 segment. Atherosclerotic calcifications are noted at the dural margin of both vertebral arteries as well. The calvarium is intact. The paranasal sinuses and mastoid air cells are clear. No significant extracranial soft tissue lesions are evident. IMPRESSION: 1. Stable atrophy and white matter disease. This likely reflects the sequela of chronic microvascular ischemia. 2. Atherosclerosis. 3. No acute intracranial abnormality or significant interval change. Electronically Signed   By: San Morelle M.D.   On: 12/29/2015 12:07   Nm Gi Blood Loss  01/10/2016  CLINICAL DATA:  GI bleed, last active bleeding was last night. EXAM: NUCLEAR MEDICINE GASTROINTESTINAL BLEEDING SCAN TECHNIQUE: Sequential abdominal images were obtained following intravenous administration of Tc-66m labeled red blood cells. RADIOPHARMACEUTICALS:  25.5 mCi Tc-7m in-vitro labeled red cells. COMPARISON:  02/26/2005 CT abdomen/pelvis. FINDINGS: Some of the images are obscured by the patient's overlying left upper extremity. There is no convincing scintigraphic evidence of active GI bleeding during the 2 hour observation period. Curvilinear uptake in the midline lower portion of the images is most consistent with penile uptake. IMPRESSION: No scintigraphic evidence of active GI bleed. Electronically Signed   By: Ilona Sorrel M.D.   On: 01/10/2016 13:10     CBC  Recent Labs Lab 01/08/16 0934  01/08/16 2015 01/09/16 0309 01/09/16 1356 01/10/16 0104 01/11/16 0611  WBC 8.2  --  9.6 8.7  --  16.0* 9.5  HGB 8.9*  < > 7.8* 6.8* 11.6* 9.6* 8.9*  HCT 29.1*  < > 24.3* 21.9* 34.0* 30.3* 27.5*  PLT 240  --  215 272  --  143* 138*  MCV 85.6  --  85.9 87.3  --  83.0 84.1  MCH 26.2  --  27.6 27.1  --  26.3 27.2  MCHC  30.6  --  32.1 31.1  --  31.7 32.4  RDW 21.3*  --  21.7* 21.8*  --  18.6* 19.3*  LYMPHSABS 0.9  --   --   --   --   --   --   MONOABS 1.2*  --   --   --   --   --   --   EOSABS 0.1  --   --   --   --   --   --   BASOSABS 0.0  --   --   --   --   --   --   < > =  values in this interval not displayed.  Chemistries   Recent Labs Lab 01/05/16 0310 01/08/16 0934 01/08/16 0947 01/09/16 0302 01/09/16 1356 01/10/16 0104 01/11/16 0611  NA 137 140 138 140 139 142 140  K 3.6 4.8 4.5 5.6* 3.6 3.5 3.3*  CL 98* 102 99* 105 103 103 103  CO2 28 24  --  20*  --  26 24  GLUCOSE 98 87 82 92 76 80 80  BUN 34* 51* 48* 69* 34* 39* 56*  CREATININE 5.60* 6.10* 6.00* 6.38* 3.10* 3.64* 4.86*  CALCIUM 9.5 9.1  --  8.7*  --  8.4* 8.8*  AST  --  30  --   --   --  182* 55*  ALT  --  23  --   --   --  66* 42  ALKPHOS  --  104  --   --   --  101 88  BILITOT  --  1.2  --   --   --  1.4* 1.3*   ------------------------------------------------------------------------------------------------------------------ estimated creatinine clearance is 11.3 mL/min (by C-G formula based on Cr of 4.86). ------------------------------------------------------------------------------------------------------------------ No results for input(s): HGBA1C in the last 72 hours. ------------------------------------------------------------------------------------------------------------------ No results for input(s): CHOL, HDL, LDLCALC, TRIG, CHOLHDL, LDLDIRECT in the last 72 hours. ------------------------------------------------------------------------------------------------------------------ No results for input(s): TSH, T4TOTAL, T3FREE, THYROIDAB in the last 72 hours.  Invalid input(s): FREET3 ------------------------------------------------------------------------------------------------------------------ No results for input(s): VITAMINB12, FOLATE, FERRITIN, TIBC, IRON, RETICCTPCT in the last 72 hours.  Coagulation  profile  Recent Labs Lab 01/08/16 0934 01/08/16 1346 01/08/16 2325 01/09/16 0302 01/10/16 0104  INR 3.53* 1.55* 1.39 1.46 1.40    No results for input(s): DDIMER in the last 72 hours.  Cardiac Enzymes No results for input(s): CKMB, TROPONINI, MYOGLOBIN in the last 168 hours.  Invalid input(s): CK ------------------------------------------------------------------------------------------------------------------ Invalid input(s): POCBNP   CBG: No results for input(s): GLUCAP in the last 168 hours.     Studies: Nm Gi Blood Loss  01/10/2016  CLINICAL DATA:  GI bleed, last active bleeding was last night. EXAM: NUCLEAR MEDICINE GASTROINTESTINAL BLEEDING SCAN TECHNIQUE: Sequential abdominal images were obtained following intravenous administration of Tc-7m labeled red blood cells. RADIOPHARMACEUTICALS:  25.5 mCi Tc-60m in-vitro labeled red cells. COMPARISON:  02/26/2005 CT abdomen/pelvis. FINDINGS: Some of the images are obscured by the patient's overlying left upper extremity. There is no convincing scintigraphic evidence of active GI bleeding during the 2 hour observation period. Curvilinear uptake in the midline lower portion of the images is most consistent with penile uptake. IMPRESSION: No scintigraphic evidence of active GI bleed. Electronically Signed   By: Ilona Sorrel M.D.   On: 01/10/2016 13:10      Lab Results  Component Value Date   HGBA1C  10/02/2007    5.8 (NOTE)   The ADA recommends the following therapeutic goals for glycemic   control related to Hgb A1C measurement:   Goal of Therapy:   < 7.0% Hgb A1C   Action Suggested:  > 8.0% Hgb A1C   Ref:  Diabetes Care, 22, Suppl. 1, 1999   Lab Results  Component Value Date   LDLCALC 41 12/16/2015   CREATININE 4.86* 01/11/2016       Scheduled Meds: . sodium chloride   Intravenous Once  . sodium chloride   Intravenous Once  . allopurinol  300 mg Oral Daily  . antiseptic oral rinse  7 mL Mouth Rinse BID  .  atorvastatin  40 mg Oral q1800  . carvedilol  3.125 mg Oral BID  WC  . collagenase   Topical Daily  . darbepoetin (ARANESP) injection - DIALYSIS  200 mcg Intravenous Q Fri-HD  . doxercalciferol  5 mcg Intravenous Q M,W,F-HD  . DULoxetine  30 mg Oral Daily  . feeding supplement (NEPRO CARB STEADY)  237 mL Oral BID BM  . folic acid  1 mg Oral Daily  . midodrine  10 mg Oral Q M,W,F  . multivitamin with minerals  1 tablet Oral Daily  . pantoprazole (PROTONIX) IV  40 mg Intravenous Q12H  . sodium chloride flush  3 mL Intravenous Q12H  . sodium chloride flush  3 mL Intravenous Q12H  . thiamine  100 mg Oral Daily   Or  . thiamine  100 mg Intravenous Daily   Continuous Infusions:   Active Problems:   Chronic systolic heart failure (HCC)   End stage renal disease (HCC)   Supratherapeutic INR   Cardiomyopathy, ischemic   Open wound of left heel   PAF (paroxysmal atrial fibrillation) (HCC)   Essential hypertension   History of GI bleed   Pressure ulcer   Lower GI bleed   GI bleed    Time spent: 45 minutes   [facility] Pager [phone]. If 7PM-7AM, please contact night-coverage at [email or web address], password [facility] 01/11/2016, 11:28 AM  LOS: 3 days

## 2016-01-11 NOTE — Progress Notes (Signed)
Assessment/Plan: 1. GI bleed- slow ooze , for repeat endo in AM 2. ESRD - Normally Monday, Wednesday Friday at [facility]. Has a fistula.  3. Anemia Received Mircera on 3/1-  4. Secondary hyperparathyroidism- Continue Hectorol 5. HTN/volume-low blood pressure with GIB 6. PVD and foot wound- is not resolved- is at high risk for amputation  7. Afib, previously on coumadin  Subjective: Interval History: Nothing new  Objective: Vital signs in last 24 hours: Temp:  [97.6 F (36.4 C)-98 F (36.7 C)] 97.6 F (36.4 C) (03/19 1339) Pulse Rate:  [70-71] 70 (03/19 1339) Resp:  [16-18] 16 (03/19 0549) BP: (86-93)/(40-44) 87/41 mmHg (03/19 1339) SpO2:  [98 %-100 %] 98 % (03/19 1339) Weight:  [69.3 kg (152 lb 12.5 oz)] 69.3 kg (152 lb 12.5 oz) (03/19 0549) Weight change: 3.3 kg (7 lb 4.4 oz)  Intake/Output from previous day: 03/18 0701 - 03/19 0700 In: 30 [P.O.:30] Out: -  Intake/Output this shift:    General appearance: alert and cooperative Resp: clear to auscultation bilaterally Chest wall: no tenderness Cardio: regular rate and rhythm, S1, S2 normal, no murmur, click, rub or gallop GI: soft, non-tender; bowel sounds normal; no masses,  no organomegaly  Lab Results:  Recent Labs  01/10/16 0104 01/11/16 0611  WBC 16.0* 9.5  HGB 9.6* 8.9*  HCT 30.3* 27.5*  PLT 143* 138*   BMET:  Recent Labs  01/10/16 0104 01/11/16 0611  NA 142 140  K 3.5 3.3*  CL 103 103  CO2 26 24  GLUCOSE 80 80  BUN 39* 56*  CREATININE 3.64* 4.86*  CALCIUM 8.4* 8.8*   No results for input(s): PTH in the last 72 hours. Iron Studies: No results for input(s): IRON, TIBC, TRANSFERRIN, FERRITIN in the last 72 hours. Studies/Results: Nm Gi Blood Loss  01/10/2016  CLINICAL DATA:  GI bleed, last active bleeding was last night. EXAM: NUCLEAR MEDICINE GASTROINTESTINAL BLEEDING SCAN TECHNIQUE: Sequential abdominal images were obtained following intravenous administration of Tc-25m labeled red blood  cells. RADIOPHARMACEUTICALS:  25.5 mCi Tc-35m in-vitro labeled red cells. COMPARISON:  02/26/2005 CT abdomen/pelvis. FINDINGS: Some of the images are obscured by the patient's overlying left upper extremity. There is no convincing scintigraphic evidence of active GI bleeding during the 2 hour observation period. Curvilinear uptake in the midline lower portion of the images is most consistent with penile uptake. IMPRESSION: No scintigraphic evidence of active GI bleed. Electronically Signed   By: Ilona Sorrel M.D.   On: 01/10/2016 13:10    Scheduled: . sodium chloride   Intravenous Once  . sodium chloride   Intravenous Once  . allopurinol  300 mg Oral Daily  . antiseptic oral rinse  7 mL Mouth Rinse BID  . atorvastatin  40 mg Oral q1800  . carvedilol  3.125 mg Oral BID WC  . collagenase   Topical Daily  . darbepoetin (ARANESP) injection - DIALYSIS  200 mcg Intravenous Q Fri-HD  . doxercalciferol  5 mcg Intravenous Q M,W,F-HD  . DULoxetine  30 mg Oral Daily  . feeding supplement (NEPRO CARB STEADY)  237 mL Oral BID BM  . folic acid  1 mg Oral Daily  . midodrine  10 mg Oral Q M,W,F  . multivitamin with minerals  1 tablet Oral Daily  . pantoprazole (PROTONIX) IV  40 mg Intravenous Q12H  . sodium chloride flush  3 mL Intravenous Q12H  . sodium chloride flush  3 mL Intravenous Q12H  . thiamine  100 mg Oral Daily   Or  .  thiamine  100 mg Intravenous Daily     LOS: 3 days   Koraline Phillipson C 01/11/2016,5:09 PM

## 2016-01-11 NOTE — Progress Notes (Signed)
Pt's initial BP was 83/35 mm of hg. Pt was asymptomatic. On-call provider L Harduk, PA notified and order received to check BP manually.  Manual BP is 90/40 mm of hg. No further order received at this time. Will cont to monitor closely.

## 2016-01-11 NOTE — Progress Notes (Signed)
MD rounded on patient and stated okay to have clear liquids and to monitor for signs/symptoms of bleeding

## 2016-01-11 NOTE — Progress Notes (Signed)
Subjective: One darker stool yesterday. No abdominal pain.  Objective: Vital signs in last 24 hours: Temp:  [97.8 F (36.6 C)-98.1 F (36.7 C)] 97.8 F (36.6 C) (03/19 0549) Pulse Rate:  [70-71] 70 (03/19 1041) Resp:  [16-18] 16 (03/19 0549) BP: (86-94)/(39-49) 93/44 mmHg (03/19 1041) SpO2:  [97 %-100 %] 100 % (03/19 1041) Weight:  [69.3 kg (152 lb 12.5 oz)] 69.3 kg (152 lb 12.5 oz) (03/19 0549) Weight change: 3.3 kg (7 lb 4.4 oz) Last BM Date: 01/10/16  PE: GEN:  Elderly, chronically ill-appearing, NAD ABD:  Soft, non-tender  Lab Results: CBC    Component Value Date/Time   WBC 9.5 01/11/2016 0611   RBC 3.27* 01/11/2016 0611   RBC 3.40* 01/08/2016 0934   HGB 8.9* 01/11/2016 0611   HCT 27.5* 01/11/2016 0611   PLT 138* 01/11/2016 0611   MCV 84.1 01/11/2016 0611   MCH 27.2 01/11/2016 0611   MCHC 32.4 01/11/2016 0611   RDW 19.3* 01/11/2016 0611   LYMPHSABS 0.9 01/08/2016 0934   MONOABS 1.2* 01/08/2016 0934   EOSABS 0.1 01/08/2016 0934   BASOSABS 0.0 01/08/2016 0934   CMP     Component Value Date/Time   NA 140 01/11/2016 0611   K 3.3* 01/11/2016 0611   CL 103 01/11/2016 0611   CO2 24 01/11/2016 0611   GLUCOSE 80 01/11/2016 0611   GLUCOSE 117* 11/04/2006 1154   BUN 56* 01/11/2016 0611   CREATININE 4.86* 01/11/2016 0611   CREATININE 3.96* 12/09/2015 1217   CALCIUM 8.8* 01/11/2016 0611   PROT 4.5* 01/11/2016 0611   ALBUMIN 1.5* 01/11/2016 0611   AST 55* 01/11/2016 0611   ALT 42 01/11/2016 0611   ALKPHOS 88 01/11/2016 0611   BILITOT 1.3* 01/11/2016 0611   GFRNONAA 10* 01/11/2016 0611   GFRAA 12* 01/11/2016 0611   INR 1.4  Tagged RBC yesterday negative.  Assessment:  1.  Recurrent GI bleeding (maroon/melena), in setting of anticoagulation, negative endoscopy and colonoscopy June 2016 for this problem.  Recent endoscopy this admission suboptimal due to retained gastric contents. 2.  Acute blood loss anemia.  Hgb stable. 3.  Multiple cardiac  comorbidities.  Plan:  1.  PPI. 2.  Serial CBCs. 3.  Hold anticoagulation. 4.  Repeat endoscopy tomorrow.  If endoscopy is negative, would pursue capsule endoscopy. 5.  Clear liquid diet; NPO after midnight. 6.  [facility] will follow.   Landry Dyke 01/11/2016, 11:01 AM   Pager [phone] If no answer or after 5 PM call [phone]

## 2016-01-12 ENCOUNTER — Inpatient Hospital Stay (HOSPITAL_COMMUNITY): Payer: Medicare Other | Admitting: Anesthesiology

## 2016-01-12 ENCOUNTER — Encounter (HOSPITAL_COMMUNITY): Payer: Self-pay | Admitting: *Deleted

## 2016-01-12 ENCOUNTER — Encounter (HOSPITAL_COMMUNITY)
Admission: EM | Disposition: A | Discharge status: Skilled Nursing Facility | Payer: Self-pay | Source: Home / Self Care | Attending: Internal Medicine

## 2016-01-12 DIAGNOSIS — Z515 Encounter for palliative care: Secondary | ICD-10-CM | POA: Insufficient documentation

## 2016-01-12 DIAGNOSIS — Z7189 Other specified counseling: Secondary | ICD-10-CM | POA: Insufficient documentation

## 2016-01-12 HISTORY — PX: ESOPHAGOGASTRODUODENOSCOPY (EGD) WITH PROPOFOL: SHX5813

## 2016-01-12 LAB — CBC
HCT: 29.8 % — ABNORMAL LOW (ref 39.0–52.0)
HEMOGLOBIN: 9.8 g/dL — AB (ref 13.0–17.0)
MCH: 28.5 pg (ref 26.0–34.0)
MCHC: 32.9 g/dL (ref 30.0–36.0)
MCV: 86.6 fL (ref 78.0–100.0)
PLATELETS: 150 10*3/uL (ref 150–400)
RBC: 3.44 MIL/uL — AB (ref 4.22–5.81)
RDW: 20.3 % — ABNORMAL HIGH (ref 11.5–15.5)
WBC: 9 10*3/uL (ref 4.0–10.5)

## 2016-01-12 LAB — PHOSPHORUS: PHOSPHORUS: 2.9 mg/dL (ref 2.5–4.6)

## 2016-01-12 LAB — COMPREHENSIVE METABOLIC PANEL
ALBUMIN: 1.7 g/dL — AB (ref 3.5–5.0)
ALK PHOS: 92 U/L (ref 38–126)
ALT: 34 U/L (ref 17–63)
AST: 34 U/L (ref 15–41)
Anion gap: 10 (ref 5–15)
BUN: 65 mg/dL — ABNORMAL HIGH (ref 6–20)
CALCIUM: 8.9 mg/dL (ref 8.9–10.3)
CHLORIDE: 108 mmol/L (ref 101–111)
CO2: 23 mmol/L (ref 22–32)
CREATININE: 5.88 mg/dL — AB (ref 0.61–1.24)
GFR calc non Af Amer: 8 mL/min — ABNORMAL LOW (ref >60)
GFR, EST AFRICAN AMERICAN: 9 mL/min — AB (ref >60)
GLUCOSE: 104 mg/dL — AB (ref 65–99)
Potassium: 3.8 mmol/L (ref 3.5–5.1)
SODIUM: 141 mmol/L (ref 135–145)
Total Bilirubin: 1.5 mg/dL — ABNORMAL HIGH (ref 0.3–1.2)
Total Protein: 5.1 g/dL — ABNORMAL LOW (ref 6.5–8.1)

## 2016-01-12 SURGERY — ESOPHAGOGASTRODUODENOSCOPY (EGD) WITH PROPOFOL
Anesthesia: Monitor Anesthesia Care | Laterality: Left

## 2016-01-12 MED ORDER — PHENYLEPHRINE HCL 10 MG/ML IJ SOLN
INTRAMUSCULAR | Status: DC | PRN
  Administered 2016-01-12: 120 ug via INTRAVENOUS
  Administered 2016-01-12 (×3): 80 ug via INTRAVENOUS
  Administered 2016-01-12: 120 ug via INTRAVENOUS

## 2016-01-12 MED ORDER — DOXERCALCIFEROL 4 MCG/2ML IV SOLN
INTRAVENOUS | Status: AC
  Administered 2016-01-12: 5 ug via INTRAVENOUS_CENTRAL
  Filled 2016-01-12: qty 4

## 2016-01-12 MED ORDER — PROPOFOL 10 MG/ML IV BOLUS
INTRAVENOUS | Status: DC | PRN
  Administered 2016-01-12: 30 mg via INTRAVENOUS

## 2016-01-12 MED ORDER — PROPOFOL 500 MG/50ML IV EMUL
INTRAVENOUS | Status: DC | PRN
  Administered 2016-01-12: 40 ug via INTRAVENOUS

## 2016-01-12 MED ORDER — EPHEDRINE SULFATE 50 MG/ML IJ SOLN
INTRAMUSCULAR | Status: DC | PRN
  Administered 2016-01-12: 20 mg via INTRAVENOUS
  Administered 2016-01-12 (×3): 10 mg via INTRAVENOUS

## 2016-01-12 MED ORDER — SODIUM CHLORIDE 0.9 % IV SOLN: INTRAVENOUS | Status: DC

## 2016-01-12 MED ORDER — ALLOPURINOL 100 MG PO TABS
100.0000 mg | ORAL_TABLET | Freq: Every day | ORAL | Status: DC
  Administered 2016-01-13: 100 mg via ORAL
  Filled 2016-01-12: qty 1

## 2016-01-12 MED ORDER — MIDODRINE HCL 5 MG PO TABS
10.0000 mg | ORAL_TABLET | Freq: Two times a day (BID) | ORAL | Status: DC
  Administered 2016-01-12 – 2016-01-13 (×2): 10 mg via ORAL
  Filled 2016-01-12 (×2): qty 2

## 2016-01-12 MED ORDER — LIDOCAINE HCL (CARDIAC) 20 MG/ML IV SOLN
INTRAVENOUS | Status: DC | PRN
  Administered 2016-01-12: 100 mg via INTRATRACHEAL

## 2016-01-12 MED ORDER — PROPOFOL 500 MG/50ML IV EMUL
INTRAVENOUS | Status: DC | PRN
  Administered 2016-01-12: 40 ug/kg/min via INTRAVENOUS

## 2016-01-12 NOTE — Consult Note (Signed)
Consultation Note Date: 01/12/2016   Patient Name: [name]  DOB: 04-12-1930  MRN: [id number]  Age / Sex: 82 y.o., male  PCP: Jacob Liberty, MD Referring Physician: Reyne Dumas, MD  Reason for Consultation: Establishing goals of care  82 yo male with ESRD on HD, PVD with non healing LLE heel ulcer, CHF, ischemic heart disease with medtronic AICD in place and paroxysmal atrial fibrillation previously on chronic coumadin.  He was admitted on 01/08/16 for GI bleeding.  He was previously admitted on 12/26/15 - 01/05/16 for revascularization of his LLE after LE dopplers showed an occluded stent.  He ended up refusing the procedure and returned to his SNF.  3 days later he had melanic stools and returned to the ED.  His Hgb A1C had dropped from 11 to 8.9.  He was transfused 3 units of PRBCs on 3/17.  Upper endoscopy was completed on 3/20 and a shallow peptic ulcer was found. Anticoagulation has been held.  HGb appears to be stable at 9.8.    Clinical Assessment/Narrative: I spoke with [name] over the phone.  She mentioned to me that her husband commented last week, "I'm tired of them doing all of these things to me".  I asked about hemodialysis and she replied that she felt HD was included in "these things".  She states she feels he has a very poor quality of life.  We agreed to meet 01/13/16 at 10 am for a GOC discussion.  I met briefly with [name] in HD.  He was confused and spoke about trying to find his number.  He seemed quite concerned about trying to find his number.  I asked [name] why he left arm was so swollen and he again told me he was looking for his number.  Contacts/Participants in Discussion:  Patient and his wife at separate times. Primary Decision Maker: In his current state of confusion, [name] wife will be the primary decision maker.   SUMMARY OF RECOMMENDATIONS  Code  Status/Advance Care Planning: Full code   Symptom Management:   Per primary team.  Patient not complaining of pain.  Was confused but not combative.  Palliative Prophylaxis:   Delirium Protocol, Frequent Pain Assessment and Turn Reposition   Prognosis: Unable to determine.  Less than 2 weeks if he stops HD.  Will have more information after [place] on 3/21.  Discharge Planning: Residential Hospice vs SNF with Hospice.   Chief Complaint/ Primary Diagnoses: Present on Admission:  . Lower GI bleed . Chronic systolic heart failure ([place]) . Cardiomyopathy, ischemic . End stage renal disease ([place]) . Essential hypertension . Open wound of left heel . PAF (paroxysmal atrial fibrillation) ([facility]) . Pressure ulcer . Supratherapeutic INR . GI bleed  I have reviewed the medical record, interviewed the patient and family, and examined the patient. The following aspects are pertinent.  Past Medical History  Diagnosis Date  . Dyslipidemia   . Chronic systolic CHF (congestive heart failure) ([place])     a. s/p ICD with upgrade to Medtronic BiV-ICD 07/2015 when it had reached ERI.  . Paroxysmal atrial fibrillation ([place])   . Hypothyroidism   . Coronary artery disease     a. ant-lat MI s/p PTCA/DES to LAD 2006.  Marland Kitchen Hypertension   . Ischemic cardiomyopathy     a. s/p MDT CRTD  . AICD (automatic cardioverter/defibrillator) present   . Myocardial infarction ([place])   . Depression     hx  . History of blood  transfusion 03/2015    LGIB  . GI bleed     a. 03/2015 in setting of elevated INR.  Marland Kitchen Arthritis     "everywhere"  . History of gout   . ESRD (end stage renal disease) on dialysis [facility])     "[place]; MWF" (07/10/2015)  . Prostate cancer ([place])     S/P seed implants  . Critical lower limb ischemia   . LBBB (left bundle branch block)   . Atrial tachycardia ([place])     a. WCT 06/2015 felt to be atrial tach.  . Anemia of chronic disease   . PAD (peripheral artery disease) ([facility])    Social  History   Social History  . Marital Status: Married    Spouse Name: N/A  . Number of Children: N/A  . Years of Education: N/A   Occupational History  . retired    Social History Main Topics  . Smoking status: Former Smoker -- 10 years    Types: Cigarettes, Cigars  . Smokeless tobacco: Never Used     Comment: stopped smoking in 1970  . Alcohol Use: Yes     Comment: "stopped drinking alcohol in the 1980's"  . Drug Use: No  . Sexual Activity: Not Asked   Other Topics Concern  . None   Social History Narrative   Family History  Problem Relation Age of Onset  . Diabetes Mother    Scheduled Meds: . sodium chloride   Intravenous Once  . allopurinol  100 mg Oral Daily  . antiseptic oral rinse  7 mL Mouth Rinse BID  . atorvastatin  40 mg Oral q1800  . carvedilol  3.125 mg Oral BID WC  . collagenase   Topical Daily  . darbepoetin (ARANESP) injection - DIALYSIS  200 mcg Intravenous Q Fri-HD  . DULoxetine  30 mg Oral Daily  . feeding supplement (NEPRO CARB STEADY)  237 mL Oral BID BM  . folic acid  1 mg Oral Daily  . midodrine  10 mg Oral BID  . multivitamin with minerals  1 tablet Oral Daily  . pantoprazole (PROTONIX) IV  40 mg Intravenous Q12H  . sodium chloride flush  3 mL Intravenous Q12H  . sodium chloride flush  3 mL Intravenous Q12H  . thiamine  100 mg Oral Daily   Or  . thiamine  100 mg Intravenous Daily   Continuous Infusions:  PRN Meds:.sodium chloride, acetaminophen **OR** acetaminophen, ondansetron **OR** ondansetron (ZOFRAN) IV, sodium chloride flush Medications Prior to Admission:  Prior to Admission medications   Medication Sig Start Date End Date Taking? Authorizing Provider  allopurinol (ZYLOPRIM) 100 MG tablet Take 300 mg by mouth daily.  06/12/12  Yes Historical Provider, MD  atorvastatin (LIPITOR) 40 MG tablet Take 40 mg by mouth daily at 6 PM.    Yes Historical Provider, MD  carvedilol (COREG) 3.125 MG tablet Take 1 tablet (3.125 mg total) by mouth 2  (two) times daily with a meal. 07/11/15  Yes Jacob Dials, MD  clopidogrel (PLAVIX) 75 MG tablet Take 1 tablet (75 mg total) by mouth daily with breakfast. 12/19/15  Yes Jacob Deforest, PA  diphenoxylate-atropine (LOMOTIL) 2.5-0.025 MG tablet 1 tablet once per day if greater than one episode of diarrhea. 11/25/15  Yes Tanna Furry, MD  DULoxetine (CYMBALTA) 30 MG capsule Take 30 mg by mouth daily. 12/01/15  Yes Historical Provider, MD  ethyl chloride spray Apply 1 application topically as needed. (prior to dialysis) 10/08/15  Yes Historical Provider, MD  midodrine (PROAMATINE) 10 MG tablet Take 10 mg by mouth every Monday, Wednesday, and Friday.  10/11/15  Yes Historical Provider, MD  Multiple Vitamin (MULTIVITAMIN WITH MINERALS) TABS Take 1 tablet by mouth daily. 50+   Yes Historical Provider, MD  Multiple Vitamins-Minerals (DECUBI-VITE) CAPS Take 1 capsule by mouth daily.   Yes Historical Provider, MD  Nutritional Supplements (FEEDING SUPPLEMENT, NEPRO CARB STEADY,) LIQD Take 237 mLs by mouth 2 (two) times daily between meals. 12pm, 5pm   Yes Historical Provider, MD  warfarin (COUMADIN) 3 MG tablet Take 1.5-3 mg by mouth daily at 6 PM. Take 1/2 tablet (1.5 mg) by mouth on Sunday and Thursday, take 1 tablet (3 mg) on all other days of the week 10/21/15  Yes Historical Provider, MD  collagenase (SANTYL) ointment Apply 1 application topically daily. Patient not taking: Reported on 01/08/2016 11/18/15   Trula Slade, DPM  silver sulfADIAZINE (SILVADENE) 1 % cream Apply 1 application topically daily. Patient not taking: Reported on 01/08/2016 11/19/15   Trula Slade, DPM   No Known Allergies  Review of Systems:  Patient confused.    Physical Exam  82 yo male, awake, cooperative, in HD.  Confused.  Unable to answer questions. CV RRR Resp:  No increased work of breathing Abdomen:  Soft Nt, Nd, +bs Ext:  LUE with HD access and swollen,  LLE in floating boot, warm to touch.    Vital Signs: BP 95/55 mmHg   Pulse 70  Temp(Src) 97 F (36.1 C) (Oral)  Resp 16  Ht 5' 11"  (1.803 m)  Wt 66.3 kg (146 lb 2.6 oz)  BMI 20.39 kg/m2  SpO2 100%  SpO2: SpO2: 100 % O2 Device:SpO2: 100 % O2 Flow Rate: .O2 Flow Rate (L/min): 2 L/min  IO: Intake/output summary:   Intake/Output Summary (Last 24 hours) at 01/12/16 1549 Last data filed at 01/12/16 1520  Gross per 24 hour  Intake    640 ml  Output    100 ml  Net    540 ml    LBM: Last BM Date: 01/10/16 Baseline Weight: Weight: 65.5 kg (144 lb 6.4 oz) Most recent weight: Weight: 66.3 kg (146 lb 2.6 oz)      Palliative Assessment/Data:  Flowsheet Rows        Most Recent Value   Intake Tab    Referral Department  Hospitalist   Unit at Time of Referral  Med/Surg Unit   Palliative Care Primary Diagnosis  Other (Comment)   Date Notified  01/12/16   Palliative Care Type  New Palliative care   Reason for referral  Clarify Goals of Care   Date of Admission  01/08/16   Date first seen by Palliative Care  01/12/16   # of days Palliative referral response time  0 Day(s)   # of days IP prior to Palliative referral  4   Clinical Assessment    Psychosocial & Spiritual Assessment    Palliative Care Outcomes       Additional Data Reviewed:  CBC:    Component Value Date/Time   WBC 9.0 01/12/2016 0710   HGB 9.8* 01/12/2016 0710   HCT 29.8* 01/12/2016 0710   PLT 150 01/12/2016 0710   MCV 86.6 01/12/2016 0710   NEUTROABS 6.0 01/08/2016 0934   LYMPHSABS 0.9 01/08/2016 0934   MONOABS 1.2* 01/08/2016 0934   EOSABS 0.1 01/08/2016 0934   BASOSABS 0.0 01/08/2016 0934   Comprehensive Metabolic Panel:    Component Value Date/Time   NA 141  01/12/2016 0710   K 3.8 01/12/2016 0710   CL 108 01/12/2016 0710   CO2 23 01/12/2016 0710   BUN 65* 01/12/2016 0710   CREATININE 5.88* 01/12/2016 0710   CREATININE 3.96* 12/09/2015 1217   GLUCOSE 104* 01/12/2016 0710   GLUCOSE 117* 11/04/2006 1154   CALCIUM 8.9 01/12/2016 0710   AST 34 01/12/2016 0710    ALT 34 01/12/2016 0710   ALKPHOS 92 01/12/2016 0710   BILITOT 1.5* 01/12/2016 0710   PROT 5.1* 01/12/2016 0710   ALBUMIN 1.7* 01/12/2016 0710     Time In: 2:00 Time Out: 3:00 Time Total: 60 min Greater than 50%  of this time was spent counseling and coordinating care related to the above assessment and plan.  Signed by: Imogene Burn, PA-C Palliative Medicine Pager: [phone]   01/12/2016, 3:49 PM  Please contact Palliative Medicine Team phone at [phone] for questions and concerns.

## 2016-01-12 NOTE — Progress Notes (Signed)
[facility] PROGRESS NOTE  [name] [id number] DOB: Nov 07, 1929 DOA: 01/08/2016 PCP: Jacob Brazil, MD  Length of stay: 4   Assessment/Plan: Active Problems:   Chronic systolic heart failure (HCC)   End stage renal disease ([place])   Supratherapeutic INR   Cardiomyopathy, ischemic   Open wound of left heel   PAF (paroxysmal atrial fibrillation) (HCC)   Essential hypertension   History of GI bleed   Pressure ulcer   Lower GI bleed   GI bleed    Brief summary 82 y.o. male with pmh of ESRD on HD m/w/f, afib on chronic coumadin, CAD, HTN, HLD, ICM/chronic systolic HF (s/p ICD placement), chronic anemia, GIB in setting of supratherapeutic INR in 03/2015, PAD with [name] ulcer. Pt was hospitalized 12/25/25 through 01/05/16 for planned revascularization after lower extremity dopplers showes occluded stent. During this hospitalization, pt's Coumadin held, INR reversed. However, pt ended up refusing procedure and was restarted on Coumadin with heparin bridge and d/c'd SNF when INR therapeutic. Pt has been back at SNF for 3 days. Presented with approximately 4 dark stools. ED nurse reports another large, melenic stool on arrival to ED. Pt describes some abdominal cramping just prior to BM, but otherwise no abdominal pain, n/v. Denies recent chest pain, sob, headache or dizziness. Hemoglobin dropped from 11->6.8. GI consulted    History prior to this admission [name] is a 82 y/o male, with h/o ischemic cardiomyopathy s/p AICD, followed by [name]. He also has a h/o chronic atrial fibrillation, on chronic coumadin and h/o ESRD on HD. He was recently referred to [name] by [name], his podiatrist, for critical limb ischemia. He has an ulcer on his left heel which has been present for the last month. There is no history of claudication. He had Dopplers in our office 12/05/15 revealing occluded tibials bilaterally.   He was initially admitted to [facility] on 12/15/15 for planned  PV angio +/- intervention. He underwent PTA + stenting of the L proximal peroneal artery. Postprocedure, he was placed on Coumadin and Plavix without aspirin to lessen the bleeding risk. Nephrology was consulted to help manage his dialysis. He was discharged home in stable condition on 12/26/15 to SNF.   He was seen back in clinic for f/u lower arterial dopplers + ABIs 12/25/15. This showed an occluded stent. He was readmitted 12/26/15 to transition off Coumadin in preparation for another attempt at revascularization.  His coumadin was held and IV heparin was started once INR was <2.0. PV angio was delayed further as patient developed acute delirium/confusion. Neurology was consulted. EEG obtained at time of confusion was negative. CT was negative. It was felt to be acute delirium/ sundowning/ probable component of dementia. Nephrology cleared him for repeat PV angio, which was rescheduled 12/31/15. This was scheduled with [name], however it was noted that the patient, "communicated that he did not want to have the procedure done". [name] discussed the procedure with him and the indications in detail. He said that he was "tired of having procedures done and wants to be left alone". It was explained to him the risk of amputation. He mentioned that he understood and he was okay with that (see [name] Progress Note from 12/31/15 outlining conversation). It was also felt by [name] and [name] that his leg might not be salvageable regardless of intervention. It was felt that BKA might be the right option for him if his leg does not  heal. His procedure was canceled. Continued wound care was recommended       Assessment and plan GI bleed in setting of supratherapeutic INR -hx of same in 03/2015 at which time pt underwent upper and lower endoscopy with no source of bleeding [name]. SBP in 90's but this seems baseline for this pt Holding  Coumadin and Plavix Reassess need for transfusion tomorrow -PPI. Clear  liquid , EGD showed retained food , Bleeding from distal to the Stomach/duodenum Patient seen by [name] recommended pool RBC scan Which was negative Hemoglobin dropped overnight 11.6> 9.6 > 8.9 without any ongoing bleeding Status post receiving 3 units of packed red blood cells with dialysis On 3/17 EGD 3/17, 3/20, EGD  this morning shows small hiatal hernia possible bleeding esophageal ulcer, GI recommends PPI indefinitely, liquid diet today, Discussed with GI he is not a candidate for antiplatelet therapy or anticoagulation    Hx afib on chronic coumadin/supratherapeutic INR -currently NSR Held  Coumadin, give vit K x 1 dose in setting of active bleeding -monitor INR -pt is not a great Coumadin candidate given high fall risk and hx of GIB.   Chronic systolic HF/ICM -volume stable on admit. Last Echo 06/2015 EF 20-25%) -monitor  Anemia, chronic dz + GI loss -Hgb 6.8 from 11.0 on 3/13 -q8h CBCs to determine need for transfusion  Hx hypertension -SBP seems to run around 90s -will continue home BB for now with holding parameters. Monitor  ESRD on HD -last dialysis 3/15. Volume stable on admit -notify renal of admit  PVD with L heal ulcer -refused revascularization during recent admit Not a candidate for invasive workup, Patient has himself refused amputation during his last admission Of anticoagulation and Plavix due to GI bleeding   increased risk of sepsis, risk of amputation, ischemic limb, palliative care consultation has been requested Wife by the bedside requesting cardiology consultation to see if he is a candidate for revascularization of his left leg, therefore cardiology reconsulted  Sacral ulcer -wounld care  DVT prophylaxsis  SCDs   Code Status:      Code Status Orders        Start     Ordered   01/08/16 1833  Full code   Continuous     01/08/16 1832     Family Communication: Discussed in detail with the Wife, all imaging results, lab results  explained to the patient   Disposition Plan:   Palliative care consultation requested for goals of care, Cardiology consultation requested for left lower extremity peripheral vascular disease    Consultants:   gastroenterology  Nephrology  Palliative care   procedure none  Antibiotics: Anti-infectives    None         HPI/Subjective: Patient denies any melena overnight,  discussed with the patient's wife, she is requesting cardiology consultation  Objective: Filed Vitals:   01/12/16 1140 01/12/16 1157 01/12/16 1228 01/12/16 1240  BP: 85/31 142/81 97/45 137/93  Pulse:  83 70 114  Temp:   97.9 F (36.6 C) 98 F (36.7 C)  TempSrc:   Oral Oral  Resp:   15 19  Height:      Weight:      SpO2:   100% 97%    Intake/Output Summary (Last 24 hours) at 01/12/16 1325 Last data filed at 01/12/16 1130  Gross per 24 hour  Intake    640 ml  Output      0 ml  Net    640 ml  Exam:  General: No acute respiratory distress Lungs: Clear to auscultation bilaterally without wheezes or crackles Cardiovascular: Regular rate and rhythm without murmur gallop or rub normal S1 and S2 Abdomen: Nontender, nondistended, soft, bowel sounds positive, no rebound, no ascites, no appreciable mass Extremities: No significant cyanosis, clubbing, or edema bilateral lower extremities     Data Review   Micro Results Recent Results (from the past 240 hour(s))  MRSA PCR Screening     Status: None   Collection Time: 01/08/16  6:14 PM  Result Value Ref Range Status   MRSA by PCR NEGATIVE NEGATIVE Final    Comment:        The GeneXpert MRSA Assay (FDA approved for NASAL specimens only), is one component of a comprehensive MRSA colonization surveillance program. It is not intended to diagnose MRSA infection nor to guide or monitor treatment for MRSA infections.     Radiology Reports Ct Head Wo Contrast  12/29/2015  CLINICAL DATA:  Recent CVA.  Confusion and cognitive decline. EXAM:  CT HEAD WITHOUT CONTRAST TECHNIQUE: Contiguous axial images were obtained from the base of the skull through the vertex without intravenous contrast. COMPARISON:  CT head without contrast 11/25/2015. FINDINGS: Moderate generalized atrophy and white matter disease is stable. No acute cortical infarct, hemorrhage, or mass lesion is evident. The ventricles are proportionate to the degree of atrophy. No significant extra-axial fluid collection is present. Atherosclerotic calcifications are present in the cavernous internal carotid arteries, extending to the proximal right M1 segment. Atherosclerotic calcifications are noted at the dural margin of both vertebral arteries as well. The calvarium is intact. The paranasal sinuses and mastoid air cells are clear. No significant extracranial soft tissue lesions are evident. IMPRESSION: 1. Stable atrophy and white matter disease. This likely reflects the sequela of chronic microvascular ischemia. 2. Atherosclerosis. 3. No acute intracranial abnormality or significant interval change. Electronically Signed   By: San Morelle M.D.   On: 12/29/2015 12:07   Nm Gi Blood Loss  01/10/2016  CLINICAL DATA:  GI bleed, last active bleeding was last night. EXAM: NUCLEAR MEDICINE GASTROINTESTINAL BLEEDING SCAN TECHNIQUE: Sequential abdominal images were obtained following intravenous administration of Tc-52m labeled red blood cells. RADIOPHARMACEUTICALS:  25.5 mCi Tc-11m in-vitro labeled red cells. COMPARISON:  02/26/2005 CT abdomen/pelvis. FINDINGS: Some of the images are obscured by the patient's overlying left upper extremity. There is no convincing scintigraphic evidence of active GI bleeding during the 2 hour observation period. Curvilinear uptake in the midline lower portion of the images is most consistent with penile uptake. IMPRESSION: No scintigraphic evidence of active GI bleed. Electronically Signed   By: Ilona Sorrel M.D.   On: 01/10/2016 13:10     CBC  Recent  Labs Lab 01/08/16 0934  01/08/16 2015 01/09/16 0309 01/09/16 1356 01/10/16 0104 01/11/16 0611 01/12/16 0710  WBC 8.2  --  9.6 8.7  --  16.0* 9.5 9.0  HGB 8.9*  < > 7.8* 6.8* 11.6* 9.6* 8.9* 9.8*  HCT 29.1*  < > 24.3* 21.9* 34.0* 30.3* 27.5* 29.8*  PLT 240  --  215 272  --  143* 138* 150  MCV 85.6  --  85.9 87.3  --  83.0 84.1 86.6  MCH 26.2  --  27.6 27.1  --  26.3 27.2 28.5  MCHC 30.6  --  32.1 31.1  --  31.7 32.4 32.9  RDW 21.3*  --  21.7* 21.8*  --  18.6* 19.3* 20.3*  LYMPHSABS 0.9  --   --   --   --   --   --   --  MONOABS 1.2*  --   --   --   --   --   --   --   EOSABS 0.1  --   --   --   --   --   --   --   BASOSABS 0.0  --   --   --   --   --   --   --   < > = values in this interval not displayed.  Chemistries   Recent Labs Lab 01/08/16 0934  01/09/16 0302 01/09/16 1356 01/10/16 0104 01/11/16 0611 01/12/16 0710  NA 140  < > 140 139 142 140 141  K 4.8  < > 5.6* 3.6 3.5 3.3* 3.8  CL 102  < > 105 103 103 103 108  CO2 24  --  20*  --  26 24 23   GLUCOSE 87  < > 92 76 80 80 104*  BUN 51*  < > 69* 34* 39* 56* 65*  CREATININE 6.10*  < > 6.38* 3.10* 3.64* 4.86* 5.88*  CALCIUM 9.1  --  8.7*  --  8.4* 8.8* 8.9  AST 30  --   --   --  182* 55* 34  ALT 23  --   --   --  66* 42 34  ALKPHOS 104  --   --   --  101 88 92  BILITOT 1.2  --   --   --  1.4* 1.3* 1.5*  < > = values in this interval not displayed. ------------------------------------------------------------------------------------------------------------------ estimated creatinine clearance is 9.1 mL/min (by C-G formula based on Cr of 5.88). ------------------------------------------------------------------------------------------------------------------ No results for input(s): HGBA1C in the last 72 hours. ------------------------------------------------------------------------------------------------------------------ No results for input(s): CHOL, HDL, LDLCALC, TRIG, CHOLHDL, LDLDIRECT in the last 72  hours. ------------------------------------------------------------------------------------------------------------------ No results for input(s): TSH, T4TOTAL, T3FREE, THYROIDAB in the last 72 hours.  Invalid input(s): FREET3 ------------------------------------------------------------------------------------------------------------------ No results for input(s): VITAMINB12, FOLATE, FERRITIN, TIBC, IRON, RETICCTPCT in the last 72 hours.  Coagulation profile  Recent Labs Lab 01/08/16 0934 01/08/16 1346 01/08/16 2325 01/09/16 0302 01/10/16 0104  INR 3.53* 1.55* 1.39 1.46 1.40    No results for input(s): DDIMER in the last 72 hours.  Cardiac Enzymes No results for input(s): CKMB, TROPONINI, MYOGLOBIN in the last 168 hours.  Invalid input(s): CK ------------------------------------------------------------------------------------------------------------------ Invalid input(s): POCBNP   CBG: No results for input(s): GLUCAP in the last 168 hours.     Studies: No results [name].    Lab Results  Component Value Date   HGBA1C  10/02/2007    5.8 (NOTE)   The ADA recommends the following therapeutic goals for glycemic   control related to Hgb A1C measurement:   Goal of Therapy:   < 7.0% Hgb A1C   Action Suggested:  > 8.0% Hgb A1C   Ref:  Diabetes Care, 22, Suppl. 1, 1999   Lab Results  Component Value Date   LDLCALC 41 12/16/2015   CREATININE 5.88* 01/12/2016       Scheduled Meds: . sodium chloride   Intravenous Once  . allopurinol  100 mg Oral Daily  . antiseptic oral rinse  7 mL Mouth Rinse BID  . atorvastatin  40 mg Oral q1800  . carvedilol  3.125 mg Oral BID WC  . collagenase   Topical Daily  . darbepoetin (ARANESP) injection - DIALYSIS  200 mcg Intravenous Q Fri-HD  . doxercalciferol  5 mcg Intravenous Q M,W,F-HD  . DULoxetine  30 mg Oral Daily  . feeding supplement (NEPRO  CARB STEADY)  237 mL Oral BID BM  . folic acid  1 mg Oral Daily  . midodrine  10 mg  Oral Q M,W,F  . multivitamin with minerals  1 tablet Oral Daily  . pantoprazole (PROTONIX) IV  40 mg Intravenous Q12H  . sodium chloride flush  3 mL Intravenous Q12H  . sodium chloride flush  3 mL Intravenous Q12H  . thiamine  100 mg Oral Daily   Or  . thiamine  100 mg Intravenous Daily   Continuous Infusions:   Active Problems:   Chronic systolic heart failure (HCC)   End stage renal disease (HCC)   Supratherapeutic INR   Cardiomyopathy, ischemic   Open wound of left heel   PAF (paroxysmal atrial fibrillation) (HCC)   Essential hypertension   History of GI bleed   Pressure ulcer   Lower GI bleed   GI bleed    Time spent: 45 minutes   [facility] Pager [phone]. If 7PM-7AM, please contact night-coverage at [email or web address], password [facility] 01/12/2016, 1:25 PM  LOS: 4 days

## 2016-01-12 NOTE — Interval H&P Note (Signed)
History and Physical Interval Note:  01/12/2016 10:54 AM  [name]  has presented today for surgery, with the diagnosis of Blood in stool, anemia  The various methods of treatment have been discussed with the patient and family. After consideration of risks, benefits and other options for treatment, the patient has consented to  Procedure(s): ESOPHAGOGASTRODUODENOSCOPY (EGD) WITH PROPOFOL (Left) as a surgical intervention .  The patient's history has been reviewed, patient examined, no change in status, stable for surgery.  I have reviewed the patient's chart and labs.  Questions were answered to the patient's satisfaction.     [name] M  Assessment:  1.  Blood in stool.  Incomplete egd last week (retained food in stomach). Negative endoscopy and colonoscopy June 2016 for similar presentation. 2.  Anemia.    Plan:  1.  Endoscopy. 2.  Risks (bleeding, infection, bowel perforation that could require surgery, sedation-related changes in cardiopulmonary systems), benefits (identification and possible treatment of source of symptoms, exclusion of certain causes of symptoms), and alternatives (watchful waiting, radiographic imaging studies, empiric medical treatment) of upper endoscopy (EGD) were explained to patient/family in detail and patient wishes to proceed.

## 2016-01-12 NOTE — Progress Notes (Addendum)
Assessment: 1. GI bleed- slow ooze , for EGD today 2. ESRD - MWF HD [facility].  Has a fistula.  3. Anemia Received Mircera on 3/1   4. Secondary hyperparathyroidism- corr Ca is 11 , dc hectorol for now 5. HTN/volume-low blood pressure with GIB 6. PVD and L foot wound- is not resolved- is at high risk for amputation  7. Afib/ CM low EF/ ICD and BiV PM-  previously on coumadin, now stopped due to GIB and high fall risk. On coreg for CM most likely, low dose.  8. LUE swelling - not sure cause, may need to assess w IR 9. Chron hypotension - change to 10 mg bid of midodrine  Plan - dc hectorol, HD today, ^midodrine. Not tolerating UF, keep even for now.   (NW MWF   4h  400/800  66.5kg  2/2.25 bath  P4  LUA AVF  Hep none) Mircera 200 ug every 2 wks Venofer 50/wk Hect 5 ug  Subjective: Interval History: Nothing new  Objective: Vital signs in last 24 hours: Temp:  [97 F (36.1 C)-98 F (36.7 C)] 97 F (36.1 C) (03/20 1327) Pulse Rate:  [70-114] 72 (03/20 1327) Resp:  [14-19] 16 (03/20 1327) BP: (73-162)/(23-93) 162/66 mmHg (03/20 1327) SpO2:  [94 %-100 %] 100 % (03/20 1327) Weight:  [66.3 kg (146 lb 2.6 oz)-67.8 kg (149 lb 7.6 oz)] 66.3 kg (146 lb 2.6 oz) (03/20 1327) Weight change: -1.5 kg (-3 lb 4.9 oz)  Intake/Output from previous day: 03/19 0701 - 03/20 0700 In: 560 [P.O.:560] Out: 0  Intake/Output this shift: Total I/O In: 300 [I.V.:300] Out: -   General appearance: alert and cooperative Resp: clear to auscultation bilaterally Chest wall: no tenderness Cardio: regular rate and rhythm, S1, S2 normal, no murmur, click, rub or gallop GI: soft, non-tender; bowel sounds normal; no masses,  no organomegaly  Lab Results:  Recent Labs  01/11/16 0611 01/12/16 0710  WBC 9.5 9.0  HGB 8.9* 9.8*  HCT 27.5* 29.8*  PLT 138* 150   BMET:   Recent Labs  01/11/16 0611 01/12/16 0710  NA 140 141  K 3.3* 3.8  CL 103 108  CO2 24 23  GLUCOSE 80 104*  BUN 56* 65*  CREATININE  4.86* 5.88*  CALCIUM 8.8* 8.9   No results for input(s): PTH in the last 72 hours. Iron Studies: No results for input(s): IRON, TIBC, TRANSFERRIN, FERRITIN in the last 72 hours. Studies/Results: No results found.  Scheduled: . sodium chloride   Intravenous Once  . allopurinol  100 mg Oral Daily  . antiseptic oral rinse  7 mL Mouth Rinse BID  . atorvastatin  40 mg Oral q1800  . carvedilol  3.125 mg Oral BID WC  . collagenase   Topical Daily  . darbepoetin (ARANESP) injection - DIALYSIS  200 mcg Intravenous Q Fri-HD  . doxercalciferol  5 mcg Intravenous Q M,W,F-HD  . DULoxetine  30 mg Oral Daily  . feeding supplement (NEPRO CARB STEADY)  237 mL Oral BID BM  . folic acid  1 mg Oral Daily  . midodrine  10 mg Oral Q M,W,F  . multivitamin with minerals  1 tablet Oral Daily  . pantoprazole (PROTONIX) IV  40 mg Intravenous Q12H  . sodium chloride flush  3 mL Intravenous Q12H  . sodium chloride flush  3 mL Intravenous Q12H  . thiamine  100 mg Oral Daily   Or  . thiamine  100 mg Intravenous Daily     LOS: 4  days   Addylynn Balin,Braylynn D 01/12/2016,1:45 PM

## 2016-01-12 NOTE — Anesthesia Procedure Notes (Signed)
Procedure Name: MAC Date/Time: 01/12/2016 11:15 AM Performed by: Suzy Bouchard Pre-anesthesia Checklist: Patient identified, Timeout performed, Emergency Drugs available, Suction available and Patient being monitored Patient Re-evaluated:Patient Re-evaluated prior to inductionOxygen Delivery Method: Nasal cannula

## 2016-01-12 NOTE — Op Note (Signed)
[facility] Patient Name: [name] Procedure Date : 01/12/2016 MRN: [id number] Attending MD: Arta Silence , MD Date of Birth: 1930/01/12 CSN: [id number] Age: 82 Admit Type: Inpatient Procedure:                Upper GI endoscopy Indications:              Acute post hemorrhagic anemia, Melena Providers:                Arta Silence, MD, Dortha Schwalbe, RN, Cherylynn Ridges, Technician, Ivar Drape, CRNA Referring MD:              Medicines:                Propofol per Anesthesia Complications:            No immediate complications. Estimated Blood Loss:     Estimated blood loss: none. Procedure:                Pre-Anesthesia Assessment:                           - Prior to the procedure, a History and Physical                            was performed, and patient medications and                            allergies were reviewed. The patient's tolerance of                            previous anesthesia was also reviewed. The risks                            and benefits of the procedure and the sedation                            options and risks were discussed with the patient.                            All questions were answered, and informed consent                            was obtained. Prior Anticoagulants: The patient has                            taken Coumadin (warfarin), last dose was 1 week                            prior to procedure. ASA Grade Assessment: IV - A                            patient with severe systemic disease that is a  constant threat to life. After reviewing the risks                            and benefits, the patient was deemed in                            satisfactory condition to undergo the procedure.                           After obtaining informed consent, the endoscope was                            passed under direct vision. Throughout the        procedure, the patient's blood pressure, pulse, and                            oxygen saturations were monitored continuously. The                            [id number]) scope was introduced through the                            mouth, and advanced to the second part of duodenum.                            The upper GI endoscopy was accomplished without                            difficulty. The patient tolerated the procedure                            well. Scope In: Scope Out: Findings:      A small hiatal hernia was present.      The lower third of the esophagus was moderately tortuous.      One superficial esophageal ulcer with no bleeding was found.      The exam of the esophagus was otherwise normal.      The entire examined stomach was normal.      The duodenal bulb, first portion of the duodenum and second portion of       the duodenum were normal. Impression:               - Small hiatal hernia.                           - Tortuous esophagus.                           - Non-bleeding esophageal ulcer.                           - Normal stomach.                           - Normal duodenal bulb, first portion of the  duodenum and second portion of the duodenum. Moderate Sedation:      None Recommendation:           - Return patient to hospital ward for ongoing care.                           - Continue present medications.                           - PPI indefinitely.                           - Unclear if esophageal ulceration is source of                            patient's recurrent bleeding (in fact it is                            doubtful), but there will be increased risk of                            rebleeding if anticoagulation is restarted.                           - If ultimately it is felt best to restart                            anticoagulation, would ideally wait a couple weeks                            for this  esophageal ulceration to resolve.                           - If patient is discharged home and rebleeds again,                            might consider capsule endoscopy for further                            evaluation, if continued maximal medical management                            is opted for.                           [facility] will follow.                           - Full liquid diet today. Procedure Code(s):        --- Professional ---                           [phone], Esophagogastroduodenoscopy, flexible,                            transoral; diagnostic, including collection of  specimen(s) by brushing or washing, when performed                            (separate procedure) Diagnosis Code(s):        --- Professional ---                           K44.9, Diaphragmatic hernia without obstruction or                            gangrene                           Q39.9, Congenital malformation of esophagus,                            unspecified                           K22.10, Ulcer of esophagus without bleeding                           D62, Acute posthemorrhagic anemia                           K92.1, Melena (includes Hematochezia) CPT copyright 2016 American Medical Association. All rights reserved. The codes documented in this report are preliminary and upon coder review may  be revised to meet current compliance requirements. Arta Silence, MD Arta Silence, MD 01/12/2016 11:32:37 AM This report has been signed electronically. Number of Addenda: 0

## 2016-01-12 NOTE — Interval H&P Note (Signed)
History and Physical Interval Note:  01/12/2016 10:50 AM  [name]  has presented today for surgery, with the diagnosis of Blood in stool, anemia  The various methods of treatment have been discussed with the patient and family. After consideration of risks, benefits and other options for treatment, the patient has consented to  Procedure(s): ESOPHAGOGASTRODUODENOSCOPY (EGD) WITH PROPOFOL (Left) as a surgical intervention .  The patient's history has been reviewed, patient examined, no change in status, stable for surgery.  I have reviewed the patient's chart and labs.  Questions were answered to the patient's satisfaction.     Jennell Janosik M  Assessment:  1.  Blood in stool. 2.  Anemia.  Plan:  1.  Endoscopy. 2.  Risks (bleeding, infection, bowel perforation that could require surgery, sedation-related changes in cardiopulmonary systems), benefits (identification and possible treatment of source of symptoms, exclusion of certain causes of symptoms), and alternatives (watchful waiting, radiographic imaging studies, empiric medical treatment) of upper endoscopy (EGD) were explained to patient/family in detail and patient wishes to proceed.

## 2016-01-12 NOTE — Anesthesia Postprocedure Evaluation (Signed)
Anesthesia Post Note  Patient: [name]  Procedure(s) Performed: Procedure(s) (LRB): ESOPHAGOGASTRODUODENOSCOPY (EGD) WITH PROPOFOL (Left)  Patient location during evaluation: PACU Anesthesia Type: MAC Level of consciousness: awake and alert Pain management: pain level controlled Vital Signs Assessment: post-procedure vital signs reviewed and stable Respiratory status: spontaneous breathing, nonlabored ventilation, respiratory function stable and patient connected to nasal cannula oxygen Cardiovascular status: stable and blood pressure returned to baseline Anesthetic complications: no    Last Vitals:  Filed Vitals:   01/12/16 1430 01/12/16 1513  BP: 102/53 95/55  Pulse: 70 70  Temp:    Resp: 18 16    Last Pain:  Filed Vitals:   01/12/16 1515  PainSc: 0-No pain                 Keshaun Dubey,JAMES TERRILL

## 2016-01-12 NOTE — Transfer of Care (Signed)
Immediate Anesthesia Transfer of Care Note  Patient: [name]  Procedure(s) Performed: Procedure(s): ESOPHAGOGASTRODUODENOSCOPY (EGD) WITH PROPOFOL (Left)  Patient Location: PACU  Anesthesia Type:MAC  Level of Consciousness: awake, alert , oriented and patient cooperative  Airway & Oxygen Therapy: Patient Spontanous Breathing and Patient connected to nasal cannula oxygen  Post-op Assessment: Report given to RN, Post -op Vital signs reviewed and stable and Patient moving all extremities X 4  Post vital signs: Reviewed and stable  Last Vitals:  Filed Vitals:   01/12/16 1137 01/12/16 1138  BP: 91/33   Pulse:  74  Temp:    Resp: 15 18    Complications: No apparent anesthesia complications

## 2016-01-12 NOTE — Progress Notes (Signed)
PT Cancellation Note  Patient Details Name: [name] MRN: [id number] DOB: 11-Sep-1930   Cancelled Treatment:    Reason Eval/Treat Not Completed: Patient at procedure or test/unavailable.  Pt at upper endoscopy this AM and now at HD this PM.  PT will check back tomorrow.   Thanks,   Barbarann Ehlers. [place], [place], DPT [phone]   01/12/2016, 2:53 PM

## 2016-01-12 NOTE — Anesthesia Preprocedure Evaluation (Addendum)
Anesthesia Evaluation  Patient identified by MRN, date of birth, ID band Patient awake    Reviewed: Allergy & Precautions, NPO status , Patient's Chart, lab work & pertinent test results  Airway Mallampati: II  TM Distance: >3 FB Neck ROM: full    Dental  (+) Poor Dentition, Missing   Pulmonary shortness of breath, former smoker,    breath sounds clear to auscultation       Cardiovascular hypertension, + CAD, + Past MI, + Peripheral Vascular Disease and +CHF  + dysrhythmias Atrial Fibrillation + Cardiac Defibrillator  Rhythm:irregular Rate:Normal  Pacer/AICD interrogation 10/2015 Patient bi-ventricularly pacing 99.9% of the time, 6ys longevity  Echo 06/2015 - Left ventricle: The cavity size was mildly dilated. Systolic  function was severely reduced. The estimated ejection fraction was in the range of 20% to 25%. There is akinesis of the entireanteroseptal myocardium. There is moderate hypokinesis of the entireinferolateral myocardium. Doppler parameters are consistent with abnormal left ventricular relaxation (grade 1 diastolic dysfunction). - Ventricular septum: Septal motion showed paradox. - Aortic valve: Transvalvular velocity was minimally increased, due to stenosis. There was very mild stenosis. There was mild regurgitation. - Mitral valve: There was mild regurgitation. - Left atrium: The atrium was moderately to severely dilated. - Right ventricle: Systolic function was mildly to moderately reduced. - Tricuspid valve: There was moderate regurgitation. - Pulmonary arteries: Systolic pressure was moderately increased. PA peak pressure: 50 mm Hg (S).   Neuro/Psych    GI/Hepatic   Endo/Other  Hypothyroidism   Renal/GU Renal InsufficiencyRenal disease     Musculoskeletal  (+) Arthritis ,   Abdominal   Peds  Hematology  (+) anemia ,   Anesthesia Other Findings   Reproductive/Obstetrics                             Anesthesia Physical Anesthesia Plan  ASA: IV  Anesthesia Plan: MAC   Post-op Pain Management:    Induction: Intravenous  Airway Management Planned: Natural Airway and Nasal Cannula  Additional Equipment:   Intra-op Plan:   Post-operative Plan:   Informed Consent: I have reviewed the patients History and Physical, chart, labs and discussed the procedure including the risks, benefits and alternatives for the proposed anesthesia with the patient or authorized representative who has indicated his/her understanding and acceptance.   Dental advisory given  Plan Discussed with: CRNA and Surgeon  Anesthesia Plan Comments:        Anesthesia Quick Evaluation

## 2016-01-12 NOTE — Progress Notes (Addendum)
Dr. Allyson Sabal paged to notify that after patient returned from hemodialysis that patient's arm edematous compared to prior assessment. ID band and blood bank band now very tight on arm. Patient has palpable radial pulse. Dr. Allyson Sabal stated she will address. Arm elevated on pillow. Patient denies pain.  6:48 PM Sal, RN in Hemo contacted who took care of patient and stated Dr. Jonnie Finner was aware of swelling and assessed.

## 2016-01-12 NOTE — Consult Note (Addendum)
WOC wound consult note Reason for Consult: Consult requested for multiple wounds; refer to previous consult notes. Wound type:Left heel unstageable pressure injury; 5X4.5cm, 70% red, 30% eschar, mod amt tan-green drainage, slight odor. Left outer ankle full thickness wound; 4.5X.8cm; 60% eschar, 40% red, small amt tan-green drainage, slight odor. Left hip stage 2 pressure injury; 1X1X.1cm, pink and moist, no odor or drainage. Right hip stage 2 pressure injuries; .5X.5X.1cm and .3X.3X.1cm, pink and moist, no odor or drainage Sacrum with unstageable pressure injury; 1.5X2cm, 80% tightly adhered slough, 20% red and moist, small amt tan drainage no odor Bilat buttocks with unstageable/stage 2 pressure injuries; 8X3cm affected area with 80% scattered areas of tightly adhered yellow slough, mod amt tan drainage with strong foul odor. Pressure Ulcer POA: Yes Dressing procedure/placement/frequency: Santyl for chemical debridement of nonviable tissue to sacrum, buttocks, left ankle, and left heel.  Prevalon boot to elevate LLE and reduce pressure.  Air mattress to decrease pressure to buttocks and sacrum.  Foam dressings to stage 2 pressure injuries to promote healing.  Discussed plan of care with patient and family member at the bedside. Please re-consult if further assistance is needed.  Thank-you,  Julien Girt MSN, [place], [place], [place], [place]

## 2016-01-12 NOTE — Care Management Important Message (Signed)
Important Message  Patient Details  Name: [name] MRN: [id number] Date of Birth: December 26, 1929   Medicare Important Message Given:  Yes    [name] P [place] 01/12/2016, 4:29 PM

## 2016-01-13 ENCOUNTER — Encounter (HOSPITAL_COMMUNITY): Payer: Self-pay | Admitting: Gastroenterology

## 2016-01-13 ENCOUNTER — Inpatient Hospital Stay (HOSPITAL_COMMUNITY): Payer: Medicare Other

## 2016-01-13 DIAGNOSIS — Z7189 Other specified counseling: Secondary | ICD-10-CM | POA: Insufficient documentation

## 2016-01-13 DIAGNOSIS — Z66 Do not resuscitate: Secondary | ICD-10-CM | POA: Insufficient documentation

## 2016-01-13 LAB — CBC
HEMATOCRIT: 31.7 % — AB (ref 39.0–52.0)
HEMOGLOBIN: 9.9 g/dL — AB (ref 13.0–17.0)
MCH: 27 pg (ref 26.0–34.0)
MCHC: 31.2 g/dL (ref 30.0–36.0)
MCV: 86.4 fL (ref 78.0–100.0)
Platelets: 165 10*3/uL (ref 150–400)
RBC: 3.67 MIL/uL — ABNORMAL LOW (ref 4.22–5.81)
RDW: 20.9 % — AB (ref 11.5–15.5)
WBC: 8.6 10*3/uL (ref 4.0–10.5)

## 2016-01-13 LAB — COMPREHENSIVE METABOLIC PANEL
ALBUMIN: 1.8 g/dL — AB (ref 3.5–5.0)
ALK PHOS: 111 U/L (ref 38–126)
ALT: 33 U/L (ref 17–63)
AST: 42 U/L — AB (ref 15–41)
Anion gap: 14 (ref 5–15)
BILIRUBIN TOTAL: 1.3 mg/dL — AB (ref 0.3–1.2)
BUN: 23 mg/dL — AB (ref 6–20)
CALCIUM: 8.6 mg/dL — AB (ref 8.9–10.3)
CO2: 26 mmol/L (ref 22–32)
CREATININE: 3.25 mg/dL — AB (ref 0.61–1.24)
Chloride: 97 mmol/L — ABNORMAL LOW (ref 101–111)
GFR calc Af Amer: 19 mL/min — ABNORMAL LOW (ref >60)
GFR, EST NON AFRICAN AMERICAN: 16 mL/min — AB (ref >60)
GLUCOSE: 99 mg/dL (ref 65–99)
POTASSIUM: 3.4 mmol/L — AB (ref 3.5–5.1)
Sodium: 137 mmol/L (ref 135–145)
TOTAL PROTEIN: 5.5 g/dL — AB (ref 6.5–8.1)

## 2016-01-13 MED ORDER — IOHEXOL 300 MG/ML  SOLN
100.0000 mL | Freq: Once | INTRAMUSCULAR | Status: AC | PRN
  Administered 2016-01-13: 100 mL via INTRAVENOUS

## 2016-01-13 MED ORDER — SODIUM CHLORIDE 0.9 % IV SOLN: 250.0000 mL | INTRAVENOUS | Status: DC | PRN

## 2016-01-13 MED ORDER — POLYVINYL ALCOHOL 1.4 % OP SOLN: 1.0000 [drp] | Freq: Four times a day (QID) | OPHTHALMIC | Status: DC | PRN

## 2016-01-13 MED ORDER — GLYCOPYRROLATE 0.2 MG/ML IJ SOLN: 0.2000 mg | INTRAMUSCULAR | Status: DC | PRN

## 2016-01-13 MED ORDER — GLYCOPYRROLATE 1 MG PO TABS: 1.0000 mg | ORAL_TABLET | ORAL | Status: AC | PRN

## 2016-01-13 MED ORDER — FENTANYL CITRATE (PF) 100 MCG/2ML IJ SOLN: 12.5000 ug | INTRAMUSCULAR | Status: DC | PRN

## 2016-01-13 MED ORDER — ACETAMINOPHEN 325 MG PO TABS: 650.0000 mg | ORAL_TABLET | Freq: Four times a day (QID) | ORAL | Status: DC | PRN

## 2016-01-13 MED ORDER — SODIUM CHLORIDE 0.9% FLUSH: 3.0000 mL | INTRAVENOUS | Status: DC | PRN

## 2016-01-13 MED ORDER — SODIUM CHLORIDE 0.9% FLUSH: 3.0000 mL | Freq: Two times a day (BID) | INTRAVENOUS | Status: DC

## 2016-01-13 MED ORDER — DIPHENHYDRAMINE HCL 50 MG/ML IJ SOLN: 12.5000 mg | INTRAMUSCULAR | Status: DC | PRN

## 2016-01-13 MED ORDER — LORAZEPAM 0.5 MG PO TABS: 0.5000 mg | ORAL_TABLET | Freq: Four times a day (QID) | ORAL | Status: DC | PRN

## 2016-01-13 MED ORDER — MORPHINE SULFATE (CONCENTRATE) 20 MG/ML PO SOLN: 5.0000 mg | ORAL | Status: AC | PRN

## 2016-01-13 MED ORDER — LORAZEPAM 0.5 MG PO TABS: 0.5000 mg | ORAL_TABLET | Freq: Four times a day (QID) | ORAL | Status: AC | PRN

## 2016-01-13 MED ORDER — ACETAMINOPHEN 650 MG RE SUPP: 650.0000 mg | Freq: Four times a day (QID) | RECTAL | Status: DC | PRN

## 2016-01-13 MED ORDER — LORAZEPAM 2 MG/ML PO CONC: 0.5000 mg | ORAL | Status: DC | PRN

## 2016-01-13 MED ORDER — VANCOMYCIN HCL 10 G IV SOLR
1250.0000 mg | Freq: Once | INTRAVENOUS | Status: DC
  Filled 2016-01-13: qty 1250

## 2016-01-13 MED ORDER — GLYCOPYRROLATE 1 MG PO TABS: 1.0000 mg | ORAL_TABLET | ORAL | Status: DC | PRN

## 2016-01-13 MED ORDER — BIOTENE DRY MOUTH MT LIQD: 15.0000 mL | OROMUCOSAL | Status: DC | PRN

## 2016-01-13 MED ORDER — LORAZEPAM 2 MG/ML IJ SOLN: 0.5000 mg | INTRAMUSCULAR | Status: DC | PRN

## 2016-01-13 MED ORDER — IOHEXOL 350 MG/ML SOLN: 100.0000 mL | Freq: Once | INTRAVENOUS | Status: DC | PRN

## 2016-01-13 NOTE — Consult Note (Signed)
Consultation Note Date: 01/13/2016   Patient Name: [name]  DOB: 1930-06-30  MRN: [id number]  Age / Sex: 82 y.o., male  PCP: Jacob Liberty, MD Referring Physician: Reyne Dumas, MD  Reason for Consultation: Establishing goals of care  77 male with ESRD on HD, Endstage CHF with EF 20 - 25% and hypokinesis, AICD, ischemic left lower limb refusing revascularization, and GI bleeding.  He and his wife have now chosen comfort measures only.  No further hemodialysis.  Clinical Assessment/Narrative: We met with [name] at bedside.  Jacob. [name] studied biology in college, and worked his career in the post [employer].  3 years ago he started hemodialysis.  His quality of life over the past two months has taken a step decline.  Over the last month the patient has refused a revascularization of his ischemic leg and frequently becomes confused.  He has been transfused 3 units of PRBCs this admission and there is still some minimal evidence of GI bleeding.  Per the patient he has good days and bad.  He puts his fate in God's hands.  Jacob. [name] understands that without the benefit of hemodialysis he may only survive 1-2 weeks.  Contacts/Participants in Discussion: Patient and wife. Primary Decision Maker: Wife   SUMMARY OF RECOMMENDATIONS  Code Status/Advance Care Planning: DNR   Symptom Management:   Fentanyl IV for pain  Lorazepam PO for anxiety  Robinul IV or PO for secretions  Palliative Prophylaxis:   Delirium Protocol, Frequent Pain Assessment, Palliative Wound Care and Turn Reposition  Additional Recommendations (Limitations, Scope, Preferences):  Full Comfort Care, Initiate Comfort Feeding, No Blood Transfusions, No Hemodialysis, No IV Antibiotics, No IV Fluids and No Lab Draws   Psycho-social/Spiritual:  Support System: [name] Desire for further Chaplaincy support: Yes.  Chaplain  requested Additional Recommendations: Caregiving  Support/Resources and Grief/Bereavement Support  Prognosis: < 2 weeks given no further hemodialysis.  Discharge Planning: Hospice facility   Chief Complaint/ Primary Diagnoses: Present on Admission:  . Lower GI bleed . Chronic systolic heart failure ([place]) . Cardiomyopathy, ischemic . End stage renal disease ([place]) . Essential hypertension . Open wound of left heel . PAF (paroxysmal atrial fibrillation) ([place]) . Pressure ulcer . Supratherapeutic INR . GI bleed  I have reviewed the medical record, interviewed the patient and family, and examined the patient. The following aspects are pertinent.  Past Medical History  Diagnosis Date  . Dyslipidemia   . Chronic systolic CHF (congestive heart failure) ([place])     a. s/p ICD with upgrade to Med[employer]onic BiV-ICD 07/2015 when it had reached ERI.  . Paroxysmal atrial fibrillation ([place])   . Hypothyroidism   . Coronary artery disease     a. ant-lat MI s/p PTCA/DES to LAD 2006.  Marland Kitchen Hypertension   . Ischemic cardiomyopathy     a. s/p MDT CRTD  . AICD (automatic cardioverter/defibrillator) present   . Myocardial infarction ([place])   . Depression     hx  . History of blood transfusion 03/2015    LGIB  . GI bleed     a. 03/2015 in setting of elevated INR.  Marland Kitchen Arthritis     "everywhere"  . History of gout   . ESRD (end stage renal disease) on dialysis [facility])     "[place]; MWF" (07/10/2015)  . Prostate cancer ([place])     S/P seed implants  . Critical lower limb ischemia   . LBBB (left bundle branch block)   . Atrial tachycardia ([place])  a. WCT 06/2015 felt to be atrial tach.  . Anemia of chronic disease   . PAD (peripheral artery disease) ([facility])    Social History   Social History  . Marital Status: Married    Spouse Name: N/A  . Number of Children: N/A  . Years of Education: N/A   Occupational History  . retired    Social History Main Topics  . Smoking status: Former Smoker --  10 years    Types: Cigarettes, Cigars  . Smokeless tobacco: Never Used     Comment: stopped smoking in 1970  . Alcohol Use: Yes     Comment: "stopped drinking alcohol in the 1980's"  . Drug Use: No  . Sexual Activity: Not Asked   Other Topics Concern  . None   Social History Narrative   Family History  Problem Relation Age of Onset  . Diabetes Mother    Scheduled Meds: . sodium chloride   Intravenous Once  . antiseptic oral rinse  7 mL Mouth Rinse BID  . carvedilol  3.125 mg Oral BID WC  . collagenase   Topical Daily  . DULoxetine  30 mg Oral Daily  . pantoprazole (PROTONIX) IV  40 mg Intravenous Q12H  . sodium chloride flush  3 mL Intravenous Q12H  . sodium chloride flush  3 mL Intravenous Q12H  . sodium chloride flush  3 mL Intravenous Q12H   Continuous Infusions:  PRN Meds:.sodium chloride, sodium chloride, acetaminophen **OR** acetaminophen, acetaminophen **OR** acetaminophen, antiseptic oral rinse, diphenhydrAMINE, fentaNYL (SUBLIMAZE) injection, glycopyrrolate **OR** glycopyrrolate **OR** glycopyrrolate, iohexol, LORazepam **OR** LORazepam **OR** LORazepam, ondansetron **OR** ondansetron (ZOFRAN) IV, polyvinyl alcohol, sodium chloride flush, sodium chloride flush Medications Prior to Admission:  Prior to Admission medications   Medication Sig Start Date End Date Taking? Authorizing Provider  allopurinol (ZYLOPRIM) 100 MG tablet Take 300 mg by mouth daily.  06/12/12  Yes Historical Provider, MD  atorvastatin (LIPITOR) 40 MG tablet Take 40 mg by mouth daily at 6 PM.    Yes Historical Provider, MD  carvedilol (COREG) 3.125 MG tablet Take 1 tablet (3.125 mg total) by mouth 2 (two) times daily with a meal. 07/11/15  Yes Dixie Dials, MD  clopidogrel (PLAVIX) 75 MG tablet Take 1 tablet (75 mg total) by mouth daily with breakfast. 12/19/15  Yes Almyra Deforest, PA  diphenoxylate-atropine (LOMOTIL) 2.5-0.025 MG tablet 1 tablet once per day if greater than one episode of diarrhea. 11/25/15   Yes Tanna Furry, MD  DULoxetine (CYMBALTA) 30 MG capsule Take 30 mg by mouth daily. 12/01/15  Yes Historical Provider, MD  ethyl chloride spray Apply 1 application topically as needed. (prior to dialysis) 10/08/15  Yes Historical Provider, MD  midodrine (PROAMATINE) 10 MG tablet Take 10 mg by mouth every Monday, Wednesday, and Friday.  10/11/15  Yes Historical Provider, MD  Multiple Vitamin (MULTIVITAMIN WITH MINERALS) TABS Take 1 tablet by mouth daily. 50+   Yes Historical Provider, MD  Multiple Vitamins-Minerals (DECUBI-VITE) CAPS Take 1 capsule by mouth daily.   Yes Historical Provider, MD  Nutritional Supplements (FEEDING SUPPLEMENT, NEPRO CARB STEADY,) LIQD Take 237 mLs by mouth 2 (two) times daily between meals. 12pm, 5pm   Yes Historical Provider, MD  warfarin (COUMADIN) 3 MG tablet Take 1.5-3 mg by mouth daily at 6 PM. Take 1/2 tablet (1.5 mg) by mouth on Sunday and Thursday, take 1 tablet (3 mg) on all other days of the week 10/21/15  Yes Historical Provider, MD  collagenase (SANTYL) ointment Apply 1 application topically  daily. Patient not taking: Reported on 01/08/2016 11/18/15   Trula Slade, DPM  silver sulfADIAZINE (SILVADENE) 1 % cream Apply 1 application topically daily. Patient not taking: Reported on 01/08/2016 11/19/15   Trula Slade, DPM   No Known Allergies  Review of Systems:  Patient has no complaints, but is a bit drowsy.  Physical Exam  Wd AA male.  Very pleasant.  Drifts of to sleep easily.  Appears comfortable CV  RRR Resp  No increased work of breathing.  Vital Signs: BP 98/40 mmHg  Pulse 70  Temp(Src) 98.3 F (36.8 C) (Oral)  Resp 18  Ht 5' 11"  (1.803 m)  Wt 66.2 kg (145 lb 15.1 oz)  BMI 20.36 kg/m2  SpO2 99%  SpO2: SpO2: 99 % O2 Device:SpO2: 99 % O2 Flow Rate: .O2 Flow Rate (L/min): 2 L/min  IO: Intake/output summary:  Intake/Output Summary (Last 24 hours) at 01/13/16 1046 Last data filed at 01/12/16 1847  Gross per 24 hour  Intake    777 ml   Output    100 ml  Net    677 ml    LBM: Last BM Date: 01/10/16 Baseline Weight: Weight: 65.5 kg (144 lb 6.4 oz) Most recent weight: Weight: 66.2 kg (145 lb 15.1 oz)      Palliative Assessment/Data:  Flowsheet Rows        Most Recent Value   Intake Tab    Referral Department  Hospitalist   Unit at Time of Referral  Med/Surg Unit   Palliative Care Primary Diagnosis  Nephrology   Date Notified  01/12/16   Palliative Care Type  New Palliative care   Reason for referral  Clarify Goals of Care   Date of Admission  01/08/16   Date first seen by Palliative Care  01/13/16   # of days Palliative referral response time  1 Day(s)   # of days IP prior to Palliative referral  4   Clinical Assessment    Palliative Performance Scale Score  20%   Psychosocial & Spiritual Assessment    Palliative Care Outcomes    Patient/Family meeting held?  Yes   Who was at the meeting?  patient and wife   Palliative Care Outcomes  Changed to focus on comfort, Transitioned to hospice, Clarified goals of care   Patient/Family wishes: Interventions discontinued/not started   Hemodialysis, Mechanical Ventilation      Additional Data Reviewed:  CBC:    Component Value Date/Time   WBC 8.6 01/13/2016 0556   HGB 9.9* 01/13/2016 0556   HCT 31.7* 01/13/2016 0556   PLT 165 01/13/2016 0556   MCV 86.4 01/13/2016 0556   NEUTROABS 6.0 01/08/2016 0934   LYMPHSABS 0.9 01/08/2016 0934   MONOABS 1.2* 01/08/2016 0934   EOSABS 0.1 01/08/2016 0934   BASOSABS 0.0 01/08/2016 0934   Comprehensive Metabolic Panel:    Component Value Date/Time   NA 137 01/13/2016 0556   K 3.4* 01/13/2016 0556   CL 97* 01/13/2016 0556   CO2 26 01/13/2016 0556   BUN 23* 01/13/2016 0556   CREATININE 3.25* 01/13/2016 0556   CREATININE 3.96* 12/09/2015 1217   GLUCOSE 99 01/13/2016 0556   GLUCOSE 117* 11/04/2006 1154   CALCIUM 8.6* 01/13/2016 0556   AST 42* 01/13/2016 0556   ALT 33 01/13/2016 0556   ALKPHOS 111 01/13/2016 0556    BILITOT 1.3* 01/13/2016 0556   PROT 5.5* 01/13/2016 0556   ALBUMIN 1.8* 01/13/2016 0556     Time In: 10:00  Time  Out: 11:10 Time Total: 70 min. Greater than 50%  of this time was spent counseling and coordinating care related to the above assessment and plan.  Signed by: Imogene Burn, PA-C Palliative Medicine Pager: [phone]  01/13/2016, 10:46 AM  Please contact Palliative Medicine Team phone at [phone] for questions and concerns.

## 2016-01-13 NOTE — Progress Notes (Signed)
CSW investigating if patient can go back to [facility] with Hospice today.   [name] LCSWA [phone]

## 2016-01-13 NOTE — Consult Note (Signed)
   [facility] CM Inpatient Consult   01/13/2016  [name] 10/21/1930 [id number] Patient was screened for [facility] services for multiple admissions in the past 6 months. Patient is eligible for [facility] Care Management services as a benefit of his Medicare.  Chart was reviewed and found that the patient will likely discharge with Hospice services. Patient will receive full care management under these services.  No further needs identified.  Spoke with inpatient RNCM and confirmed current disposition.  For questions, please contact: Natividad Brood, RN BSN [facility] Liaison  [phone] business mobile phone Toll free office [phone]

## 2016-01-13 NOTE — Care Management Note (Signed)
Case Management Note  Patient Details  Name: [name] MRN: [id number] Date of Birth: 29-May-1930  Subjective/Objective:                 Patient admitted with Lower GI Bleed.    Action/Plan:  Will DC to residential hospice today given bed availability.  Expected Discharge Date:                  Expected Discharge Plan:  [place]  In-House Referral:  Clinical Social Work  Discharge planning Services  CM Consult  Post Acute Care Choice:    Choice offered to:     DME Arranged:    DME Agency:     HH Arranged:    [place] Agency:     Status of Service:  Completed, signed off  Medicare Important Message Given:  Yes Date Medicare IM Given:    Medicare IM give by:    Date Additional Medicare IM Given:    Additional Medicare Important Message give by:     If discussed at [place] of Stay Meetings, dates discussed:    Additional Comments:  Carles Collet, RN 01/13/2016, 12:48 PM

## 2016-01-13 NOTE — Progress Notes (Signed)
Chaplain visited with this on patient on pastoral care rounding.  Chaplain offered words of encouragement, a word of health and wholeness for the patient.  The Dr. was present at the time of this visit, Chaplain will follow up at a later time to offer continual opportunity for the patient to share any spiritual care needs he may have. Chaplain Yaakov Guthrie [phone]

## 2016-01-13 NOTE — Progress Notes (Signed)
[name] to be D/C'd to skilled nursing facility with hospice per MD order.  Discussed with the patient and all questions fully answered.  VSS, Skin clean, dry and intact without evidence of skin break down, no evidence of skin tears noted. IV catheter discontinued intact. Site without signs and symptoms of complications. Dressing and pressure applied.  An After Visit Summary was printed and given to the [place]. [facility] received prescriptions.  Patient instructed to return to ED, call 911, or call MD for any changes in condition.   Patient escorted via stretcher, and D/C home via PTAR.  Morley Kos Price 01/13/2016 7:01 PM

## 2016-01-13 NOTE — Progress Notes (Signed)
Subjective: No pain. Eating current diet. No reported GI bleeding  Objective: Vital signs in last 24 hours: Temp:  [97 F (36.1 C)-98.3 F (36.8 C)] 98.3 F (36.8 C) (03/20 2100) Pulse Rate:  [65-114] 71 (03/21 0539) Resp:  [14-20] 18 (03/21 0539) BP: (73-162)/(23-93) 102/45 mmHg (03/21 0539) SpO2:  [94 %-100 %] 99 % (03/21 0539) Weight:  [66.2 kg (145 lb 15.1 oz)-66.3 kg (146 lb 2.6 oz)] 66.2 kg (145 lb 15.1 oz) (03/21 0539) Weight change: -1.5 kg (-3 lb 4.9 oz) Last BM Date: 01/10/16  PE: GEN:  NAD, elderly and chronically ill-appearing ABD:  Soft, non-tender  Lab Results: CBC    Component Value Date/Time   WBC 8.6 01/13/2016 0556   RBC 3.67* 01/13/2016 0556   RBC 3.40* 01/08/2016 0934   HGB 9.9* 01/13/2016 0556   HCT 31.7* 01/13/2016 0556   PLT 165 01/13/2016 0556   MCV 86.4 01/13/2016 0556   MCH 27.0 01/13/2016 0556   MCHC 31.2 01/13/2016 0556   RDW 20.9* 01/13/2016 0556   LYMPHSABS 0.9 01/08/2016 0934   MONOABS 1.2* 01/08/2016 0934   EOSABS 0.1 01/08/2016 0934   BASOSABS 0.0 01/08/2016 0934   CMP     Component Value Date/Time   NA 137 01/13/2016 0556   K 3.4* 01/13/2016 0556   CL 97* 01/13/2016 0556   CO2 26 01/13/2016 0556   GLUCOSE 99 01/13/2016 0556   GLUCOSE 117* 11/04/2006 1154   BUN 23* 01/13/2016 0556   CREATININE 3.25* 01/13/2016 0556   CREATININE 3.96* 12/09/2015 1217   CALCIUM 8.6* 01/13/2016 0556   PROT 5.5* 01/13/2016 0556   ALBUMIN 1.8* 01/13/2016 0556   AST 42* 01/13/2016 0556   ALT 33 01/13/2016 0556   ALKPHOS 111 01/13/2016 0556   BILITOT 1.3* 01/13/2016 0556   GFRNONAA 16* 01/13/2016 0556   GFRAA 19* 01/13/2016 0556   Assessment:  1. Recurrent GI bleeding (maroon/melena), in setting of anticoagulation, negative endoscopy and colonoscopy June 2016 for this problem. Repeat endoscopy showed distal esophageal ulcer otherwise unrevealing. 2. Acute blood loss anemia. Hgb stable. 3. Multiple cardiac comorbidities.  Plan:  1.   PPI indefinitely. 2.  Is not deemed good anticoagulation candidate. 3.  Doubt utility in further GI tract evaluation, as patient has had two endoscopies and a colonoscopy within the past one year for this problem, and even if lesion was seen on capsule endoscopy, he would not be good candidate to have that lesion/abnormality treated. 4.  Will sign-off; please call with questions; thank you for the consultation.   [name] 01/13/2016, 9:29 AM   Pager [phone] If no answer or after 5 PM call [phone]

## 2016-01-13 NOTE — Progress Notes (Signed)
PT Cancellation Note  Patient Details Name: [name] MRN: [id number] DOB: [birth year]   Cancelled Treatment:    Reason Eval/Treat Not Completed: Other (comment).  Pt and his wife have chosen comfort measures only.  PT will sign off.  Please place new order if PT becomes appropriate.  Thank you.  Collie Siad PT, DPT  Pager: [phone] Phone: [phone] 01/13/2016, 3:55 PM

## 2016-01-13 NOTE — Anesthesia Postprocedure Evaluation (Signed)
Anesthesia Post Note  Patient: [name]  Procedure(s) Performed: Procedure(s) (LRB): ESOPHAGOGASTRODUODENOSCOPY (EGD) (Left)  Patient location during evaluation: PACU Anesthesia Type: MAC Level of consciousness: awake and alert Pain management: pain level controlled Vital Signs Assessment: post-procedure vital signs reviewed and stable Respiratory status: spontaneous breathing Cardiovascular status: stable Anesthetic complications: no    Last Vitals:  Filed Vitals:   01/12/16 1818 01/12/16 2100  BP: 97/45 106/45  Pulse: 70 75  Temp: 36.8 C 36.8 C  Resp: 18 18    Last Pain:  Filed Vitals:   01/12/16 2101  PainSc: 0-No pain                 Nolon Nations

## 2016-01-13 NOTE — Discharge Summary (Addendum)
Physician Discharge Summary  [name] MRN: [id number] DOB/AGE: [birth year] 82 y.o.  PCP: [name], MD   Admit date: 01/08/2016 Discharge date: 01/13/2016  Discharge Diagnoses:     Active Problems:   Chronic systolic heart failure (HCC)   End stage renal disease (HCC)   Supratherapeutic INR   Cardiomyopathy, ischemic   Open wound of left heel   PAF (paroxysmal atrial fibrillation) (HCC)   Essential hypertension   History of GI bleed   Pressure ulcer   Lower GI bleed   GI bleed   Palliative care encounter   Goals of care, counseling/discussion    Follow-up recommendations  Patient will be discharged to  SNF with hospice      Medication List    STOP taking these medications        clopidogrel 75 MG tablet  Commonly known as:  PLAVIX     DECUBI-VITE Caps     diphenoxylate-atropine 2.5-0.025 MG tablet  Commonly known as:  LOMOTIL     ethyl chloride spray     midodrine 10 MG tablet  Commonly known as:  PROAMATINE     multivitamin with minerals Tabs tablet     warfarin 3 MG tablet  Commonly known as:  COUMADIN      TAKE these medications        allopurinol 100 MG tablet  Commonly known as:  ZYLOPRIM  Take 300 mg by mouth daily.     atorvastatin 40 MG tablet  Commonly known as:  LIPITOR  Take 40 mg by mouth daily at 6 PM.     carvedilol 3.125 MG tablet  Commonly known as:  COREG  Take 1 tablet (3.125 mg total) by mouth 2 (two) times daily with a meal.     collagenase ointment  Commonly known as:  SANTYL  Apply 1 application topically daily.     DULoxetine 30 MG capsule  Commonly known as:  CYMBALTA  Take 30 mg by mouth daily.     feeding supplement (NEPRO CARB STEADY) Liqd  Take 237 mLs by mouth 2 (two) times daily between meals. 12pm, 5pm     glycopyrrolate 1 MG tablet  Commonly known as:  ROBINUL  Take 1 tablet (1 mg total) by mouth every 4 (four) hours as needed (excessive secretions).     LORazepam 0.5 MG tablet   Commonly known as:  ATIVAN  Take 1 tablet (0.5 mg total) by mouth every 6 (six) hours as needed for anxiety.     morphine 20 MG/ML concentrated solution  Commonly known as:  ROXANOL  Take 0.25 mLs (5 mg total) by mouth every 4 (four) hours as needed for severe pain.     silver sulfADIAZINE 1 % cream  Commonly known as:  SILVADENE  Apply 1 application topically daily.         Discharge Condition:Prognosis poor    Discharge Instructions Get Medicines reviewed and adjusted: Please take all your medications with you for your next visit with your Primary MD  Please request your Primary MD to go over all hospital tests and procedure/radiological results at the follow up, please ask your Primary MD to get all Hospital records sent to his/her office.  If you experience worsening of your admission symptoms, develop shortness of breath, life threatening emergency, suicidal or homicidal thoughts you must seek medical attention immediately by calling 911 or calling your MD immediately if symptoms less severe.  You must read complete instructions/literature along with all the possible  adverse reactions/side effects for all the Medicines you take and that have been prescribed to you. Take any new Medicines after you have completely understood and accpet all the possible adverse reactions/side effects.   Do not drive when taking Pain medications.   Do not take more than prescribed Pain, Sleep and Anxiety Medications  Special Instructions: If you have smoked or chewed Tobacco in the last 2 yrs please stop smoking, stop any regular Alcohol and or any Recreational drug use.  Wear Seat belts while driving.  Please note  You were cared for by a hospitalist during your hospital stay. Once you are discharged, your primary care physician will handle any further medical issues. Please note that NO REFILLS for any discharge medications will be authorized once you are discharged, as it is imperative  that you return to your primary care physician (or establish a relationship with a primary care physician if you do not have one) for your aftercare needs so that they can reassess your need for medications and monitor your lab values.      Discharge Instructions    Diet - low sodium heart healthy    Complete by:  As directed      Diet - low sodium heart healthy    Complete by:  As directed      Increase activity slowly    Complete by:  As directed      Increase activity slowly    Complete by:  As directed            No Known Allergies    Disposition: 03-Skilled Nursing Facility   Consults: Nephrology Palliative care     Significant Diagnostic Studies:  Ct Head Wo Contrast  12/29/2015  CLINICAL DATA:  Recent CVA.  Confusion and cognitive decline. EXAM: CT HEAD WITHOUT CONTRAST TECHNIQUE: Contiguous axial images were obtained from the base of the skull through the vertex without intravenous contrast. COMPARISON:  CT head without contrast 11/25/2015. FINDINGS: Moderate generalized atrophy and white matter disease is stable. No acute cortical infarct, hemorrhage, or mass lesion is evident. The ventricles are proportionate to the degree of atrophy. No significant extra-axial fluid collection is present. Atherosclerotic calcifications are present in the cavernous internal carotid arteries, extending to the proximal right M1 segment. Atherosclerotic calcifications are noted at the dural margin of both vertebral arteries as well. The calvarium is intact. The paranasal sinuses and mastoid air cells are clear. No significant extracranial soft tissue lesions are evident. IMPRESSION: 1. Stable atrophy and white matter disease. This likely reflects the sequela of chronic microvascular ischemia. 2. Atherosclerosis. 3. No acute intracranial abnormality or significant interval change. Electronically Signed   By: San Morelle M.D.   On: 12/29/2015 12:07   Nm Gi Blood Loss  01/10/2016   CLINICAL DATA:  GI bleed, last active bleeding was last night. EXAM: NUCLEAR MEDICINE GASTROINTESTINAL BLEEDING SCAN TECHNIQUE: Sequential abdominal images were obtained following intravenous administration of Tc-53mlabeled red blood cells. RADIOPHARMACEUTICALS:  25.5 mCi Tc-92mn-vitro labeled red cells. COMPARISON:  02/26/2005 CT abdomen/pelvis. FINDINGS: Some of the images are obscured by the patient's overlying left upper extremity. There is no convincing scintigraphic evidence of active GI bleeding during the 2 hour observation period. Curvilinear uptake in the midline lower portion of the images is most consistent with penile uptake. IMPRESSION: No scintigraphic evidence of active GI bleed. Electronically Signed   By: JaIlona Sorrel.D.   On: 01/10/2016 13:10   Ct Extrem Up Entire Arm L W/cm  01/13/2016  CLINICAL DATA:  Left arm swelling.  Confusion. EXAM: CT OF THE UPPER LEFT ARM WITH CONTRAST TECHNIQUE: Multi detector CT imaging of the left upper extremity was performed following the intravenous administration of contrast. CONTRAST:  156m OMNIPAQUE IOHEXOL 300 MG/ML  SOLN COMPARISON:  None. FINDINGS: There is moderate generalized subcutaneous edema within the left upper extremity, primarily within the dorsal aspect of the forearm. No intramuscular or other focal fluid collections are identified. There are extensive vascular calcifications. There is a dialysis graft anteriorly in the upper arm which appears patent. No foreign bodies are seen. There is no evidence of acute fracture, dislocation or bone destruction. There is no significant elbow joint effusion. Advanced glenohumeral degenerative changes are present with obliteration of the subacromial space the left shoulder consistent with a chronic rotator cuff tear. Left subclavian AICD leads are noted. IMPRESSION: 1. Left upper extremity subcutaneous edema consistent with cellulitis. No focal fluid collection to suggest abscess identified. 2. No  evidence of osteomyelitis or septic joint. 3. Advanced glenohumeral degenerative changes with evidence of chronic rotator cuff tear. Electronically Signed   By: WRichardean SaleM.D.   On: 01/13/2016 07:47       Filed Weights   01/12/16 1327 01/12/16 1728 01/13/16 0539  Weight: 66.3 kg (146 lb 2.6 oz) 66.3 kg (146 lb 2.6 oz) 66.2 kg (145 lb 15.1 oz)     Microbiology: Recent Results (from the past 240 hour(s))  MRSA PCR Screening     Status: None   Collection Time: 01/08/16  6:14 PM  Result Value Ref Range Status   MRSA by PCR NEGATIVE NEGATIVE Final    Comment:        The GeneXpert MRSA Assay (FDA approved for NASAL specimens only), is one component of a comprehensive MRSA colonization surveillance program. It is not intended to diagnose MRSA infection nor to guide or monitor treatment for MRSA infections.        Blood Culture    Component Value Date/Time   SDES WOUND LEG 10/02/2012 1859   SPECREQUEST NONE 10/02/2012 1859   CULT  10/02/2012 1859    FEW STAPHYLOCOCCUS AUREUS Note: RIFAMPIN AND GENTAMICIN SHOULD NOT BE USED AS SINGLE DRUGS FOR TREATMENT OF STAPH INFECTIONS. FEW SERRATIA MARCESCENS   REPTSTATUS 10/05/2012 FINAL 10/02/2012 1859      Labs: Results for orders placed or performed during the hospital encounter of 01/08/16 (from the past 48 hour(s))  CBC     Status: Abnormal   Collection Time: 01/12/16  7:10 AM  Result Value Ref Range   WBC 9.0 4.0 - 10.5 K/uL   RBC 3.44 (L) 4.22 - 5.81 MIL/uL   Hemoglobin 9.8 (L) 13.0 - 17.0 g/dL   HCT 29.8 (L) 39.0 - 52.0 %   MCV 86.6 78.0 - 100.0 fL   MCH 28.5 26.0 - 34.0 pg   MCHC 32.9 30.0 - 36.0 g/dL   RDW 20.3 (H) 11.5 - 15.5 %   Platelets 150 150 - 400 K/uL  Comprehensive metabolic panel     Status: Abnormal   Collection Time: 01/12/16  7:10 AM  Result Value Ref Range   Sodium 141 135 - 145 mmol/L   Potassium 3.8 3.5 - 5.1 mmol/L   Chloride 108 101 - 111 mmol/L   CO2 23 22 - 32 mmol/L   Glucose, Bld  104 (H) 65 - 99 mg/dL   BUN 65 (H) 6 - 20 mg/dL   Creatinine, Ser 5.88 (H) 0.61 - 1.24 mg/dL  Calcium 8.9 8.9 - 10.3 mg/dL   Total Protein 5.1 (L) 6.5 - 8.1 g/dL   Albumin 1.7 (L) 3.5 - 5.0 g/dL   AST 34 15 - 41 U/L   ALT 34 17 - 63 U/L   Alkaline Phosphatase 92 38 - 126 U/L   Total Bilirubin 1.5 (H) 0.3 - 1.2 mg/dL   GFR calc non Af Amer 8 (L) >60 mL/min   GFR calc Af Amer 9 (L) >60 mL/min    Comment: (NOTE) The eGFR has been calculated using the CKD EPI equation. This calculation has not been validated in all clinical situations. eGFR's persistently <60 mL/min signify possible Chronic Kidney Disease.    Anion gap 10 5 - 15  Phosphorus     Status: None   Collection Time: 01/12/16  7:10 AM  Result Value Ref Range   Phosphorus 2.9 2.5 - 4.6 mg/dL  CBC     Status: Abnormal   Collection Time: 01/13/16  5:56 AM  Result Value Ref Range   WBC 8.6 4.0 - 10.5 K/uL   RBC 3.67 (L) 4.22 - 5.81 MIL/uL   Hemoglobin 9.9 (L) 13.0 - 17.0 g/dL   HCT 31.7 (L) 39.0 - 52.0 %   MCV 86.4 78.0 - 100.0 fL   MCH 27.0 26.0 - 34.0 pg   MCHC 31.2 30.0 - 36.0 g/dL   RDW 20.9 (H) 11.5 - 15.5 %   Platelets 165 150 - 400 K/uL  Comprehensive metabolic panel     Status: Abnormal   Collection Time: 01/13/16  5:56 AM  Result Value Ref Range   Sodium 137 135 - 145 mmol/L   Potassium 3.4 (L) 3.5 - 5.1 mmol/L   Chloride 97 (L) 101 - 111 mmol/L   CO2 26 22 - 32 mmol/L   Glucose, Bld 99 65 - 99 mg/dL   BUN 23 (H) 6 - 20 mg/dL   Creatinine, Ser 3.25 (H) 0.61 - 1.24 mg/dL    Comment: DELTA CHECK NOTED   Calcium 8.6 (L) 8.9 - 10.3 mg/dL   Total Protein 5.5 (L) 6.5 - 8.1 g/dL   Albumin 1.8 (L) 3.5 - 5.0 g/dL   AST 42 (H) 15 - 41 U/L   ALT 33 17 - 63 U/L   Alkaline Phosphatase 111 38 - 126 U/L   Total Bilirubin 1.3 (H) 0.3 - 1.2 mg/dL   GFR calc non Af Amer 16 (L) >60 mL/min   GFR calc Af Amer 19 (L) >60 mL/min    Comment: (NOTE) The eGFR has been calculated using the CKD EPI equation. This calculation  has not been validated in all clinical situations. eGFR's persistently <60 mL/min signify possible Chronic Kidney Disease.    Anion gap 14 5 - 15     Lipid Panel     Component Value Date/Time   CHOL 75 12/16/2015 0533   TRIG 50 12/16/2015 0533   HDL 24* 12/16/2015 0533   CHOLHDL 3.1 12/16/2015 0533   VLDL 10 12/16/2015 0533   LDLCALC 41 12/16/2015 0533     Lab Results  Component Value Date   HGBA1C  10/02/2007    5.8 (NOTE)   The ADA recommends the following therapeutic goals for glycemic   control related to Hgb A1C measurement:   Goal of Therapy:   < 7.0% Hgb A1C   Action Suggested:  > 8.0% Hgb A1C   Ref:  Diabetes Care, 22, Suppl. 1, 1999     Lab Results  Component Value Date  [place] 41 12/16/2015   CREATININE 3.25* 01/13/2016     Brief summary 82 y.o. male with pmh of ESRD on HD m/w/f, afib on chronic coumadin, CAD, HTN, HLD, ICM/chronic systolic HF (s/p ICD placement), chronic anemia, GIB in setting of supratherapeutic INR in 03/2015, PAD with LE ulcer. Pt was hospitalized 12/25/25 through 01/05/16 for planned revascularization after lower extremity dopplers showes occluded stent. During this hospitalization, pt's Coumadin held, INR reversed. However, pt ended up refusing procedure and was restarted on Coumadin with heparin bridge and d/c'd SNF when INR therapeutic. Pt has been back at SNF for 3 days. Presented with approximately 4 dark stools. ED nurse reports another large, melenic stool on arrival to ED. Pt describes some abdominal cramping just prior to BM, but otherwise no abdominal pain, n/v. Denies recent chest pain, sob, headache or dizziness. Hemoglobin dropped from 11->6.8. GI consulted  History prior to this admission Mr. Safi is a 82 y/o male, with h/o ischemic cardiomyopathy s/p AICD, followed by Dr. Lovena Le. He also has a h/o chronic atrial fibrillation, on chronic coumadin and h/o ESRD on HD. He was recently referred to Dr. Gwenlyn Found by Dr. Earleen Newport, his  podiatrist, for critical limb ischemia. He has an ulcer on his left heel which has been present for the last month. There is no history of claudication. He had Dopplers in our office 12/05/15 revealing occluded tibials bilaterally.   He was initially admitted to [facility] on 12/15/15 for planned PV angio +/- intervention. He underwent PTA + stenting of the L proximal peroneal artery. Postprocedure, he was placed on Coumadin and Plavix without aspirin to lessen the bleeding risk. Nephrology was consulted to help manage his dialysis. He was discharged home in stable condition on 12/26/15 to SNF.   He was seen back in clinic for f/u lower arterial dopplers + ABIs 12/25/15. This showed an occluded stent. He was readmitted 12/26/15 to transition off Coumadin in preparation for another attempt at revascularization. His coumadin was held and IV heparin was started once INR was <2.0. PV angio was delayed further as patient developed acute delirium/confusion. Neurology was consulted. EEG obtained at time of confusion was negative. CT was negative. It was felt to be acute delirium/ sundowning/ probable component of dementia. Nephrology cleared him for repeat PV angio, which was rescheduled 12/31/15. This was scheduled with Dr. Fletcher Anon, however it was noted that the patient, "communicated that he did not want to have the procedure done". Dr. Fletcher Anon discussed the procedure with him and the indications in detail. He said that he was "tired of having procedures done and wants to be left alone". It was explained to him the risk of amputation. He mentioned that he understood and he was okay with that (see Dr. Tyrell Antonio Progress Note from 12/31/15 outlining conversation). It was also felt by Dr. Fletcher Anon and Dr. Gwenlyn Found that his leg might not be salvageable regardless of intervention. It was felt that BKA might be the right option for him if his leg does not heal. His procedure was canceled. Continued wound care was recommended       Assessment and  plan GI bleed in setting of supratherapeutic INR -hx of same in 03/2015 at which time pt underwent upper and lower endoscopy with no source of bleeding found. SBP in 90's but this seems baseline for this pt Discontinued Coumadin and Plavix No longer a candidate for transfusion or aggressive therapy -PPI. Clear liquid , EGD showed retained food , Bleeding from distal to the Stomach/duodenum Patient  seen by Dr. Alfonso Ellis recommended pool RBC scan Which was negative Hemoglobin dropped overnight 11.6> 9.6 > 8.9 without any ongoing bleeding Status post receiving 3 units of packed red blood cells with dialysis On 3/17 EGD 3/17, 3/20, repeat  EGDshows small hiatal hernia possible bleeding esophageal ulcer, GI recommends PPI indefinitely,   Patient seen by Dr. Paulita Fujita, doubt utility for further GI tract evaluation, 2 endoscopies and a colonoscopy within the last 1 year. Patient is not a candidate for anticoagulation, GI has signed off   Hx afib on chronic coumadin/supratherapeutic INR -currently NSR Held Coumadin, give vit K x 1 dose in setting of active bleeding -monitor INR -pt is not a great Coumadin candidate given high fall risk and hx of GIB.   Chronic systolic HF/ICM -volume stable on admit. Last Echo 06/2015 EF 20-25%) -monitor  Anemia, chronic dz + GI loss -Hgb 6.8 from 11.0 on 3/13 Hemoglobin 9.9 on the day of discharge  Hx hypertension -SBP seems to run around 90s -will continue home BB for now with holding parameters. Monitor  ESRD on HD -last dialysis 3/15. Volume stable on admit -notify renal of admit  PVD with L heal ulcer -refused revascularization during recent admit Not a candidate for invasive workup, Patient has himself refused amputation during his last admission Of anticoagulation and Plavix due to GI bleeding  increased risk of sepsis, risk of amputation, ischemic limb, palliative care consultation has been requested Wife by the bedside requesting  cardiology consultation to see if he is a candidate for revascularization of his left leg, however cardiology does not seem that the patient is a candidate for any procedures at this time  Sacral ulcer -wounld care      Palliative care consultation DO NOT RESUSCITATE, AICD turned off Patient will be discharged to residential hospice   Discharge Exam:    Blood pressure 98/40, pulse 70, temperature 98.3 F (36.8 C), temperature source Oral, resp. rate 18, height 5' 11"  (1.803 m), weight 66.2 kg (145 lb 15.1 oz), SpO2 99 %.      Follow-up Information    Follow up with [facility],GEORGE R, MD. Schedule an appointment as soon as possible for a visit in 3 days.   Specialty:  Pulmonary Disease   Contact information:   [place] [address] [phone]       Signed: Reyne Dumas 01/13/2016, 11:29 AM        Time spent >45 mins

## 2016-01-13 NOTE — Progress Notes (Signed)
Assessment: 1. GI bleed 2. ESRD - MWF 3. Anemia  4. Secondary hyperparathyroidism-  5. HTN/volume-low blood pressure with GIB 6. PVD and L foot wound 7. Afib/ CM low EF/ ICD and BiV PM 8. Chron hypotension - changed to 10 mg bid of midodrine  Plan - for dc to hospice home.  Will sign off.       (NW MWF   4h  400/800  66.5kg  2/2.25 bath  P4  LUA AVF  Hep none) Mircera 200 ug every 2 wks Venofer 50/wk Hect 5 ug  Subjective: Interval History: Nothing new  Objective: Vital signs in last 24 hours: Temp:  [98.2 F (36.8 C)-98.3 F (36.8 C)] 98.3 F (36.8 C) (03/20 2100) Pulse Rate:  [70-75] 70 (03/21 0932) Resp:  [16-20] 18 (03/21 0539) BP: (97-106)/(40-54) 98/40 mmHg (03/21 0932) SpO2:  [99 %-100 %] 99 % (03/21 0539) Weight:  [66.2 kg (145 lb 15.1 oz)-66.3 kg (146 lb 2.6 oz)] 66.2 kg (145 lb 15.1 oz) (03/21 0539) Weight change: -1.5 kg (-3 lb 4.9 oz)  Intake/Output from previous day: 03/20 0701 - 03/21 0700 In: 777 [P.O.:477; I.V.:300] Out: 100 [Urine:100] Intake/Output this shift:    General appearance: alert and cooperative Resp: clear to auscultation bilaterally Chest wall: no tenderness Cardio: regular rate and rhythm, S1, S2 normal, no murmur, click, rub or gallop GI: soft, non-tender; bowel sounds normal; no masses,  no organomegaly  Lab Results:  Recent Labs  01/12/16 0710 01/13/16 0556  WBC 9.0 8.6  HGB 9.8* 9.9*  HCT 29.8* 31.7*  PLT 150 165   BMET:   Recent Labs  01/12/16 0710 01/13/16 0556  NA 141 137  K 3.8 3.4*  CL 108 97*  CO2 23 26  GLUCOSE 104* 99  BUN 65* 23*  CREATININE 5.88* 3.25*  CALCIUM 8.9 8.6*   No results for input(s): PTH in the last 72 hours. Iron Studies: No results for input(s): IRON, TIBC, TRANSFERRIN, FERRITIN in the last 72 hours. Studies/Results: Ct Extrem Up Entire Arm L W/cm  01/13/2016  CLINICAL DATA:  Left arm swelling.  Confusion. EXAM: CT OF THE UPPER LEFT ARM WITH CONTRAST TECHNIQUE: Multi detector  CT imaging of the left upper extremity was performed following the intravenous administration of contrast. CONTRAST:  144mL OMNIPAQUE IOHEXOL 300 MG/ML  SOLN COMPARISON:  None. FINDINGS: There is moderate generalized subcutaneous edema within the left upper extremity, primarily within the dorsal aspect of the forearm. No intramuscular or other focal fluid collections are identified. There are extensive vascular calcifications. There is a dialysis graft anteriorly in the upper arm which appears patent. No foreign bodies are seen. There is no evidence of acute fracture, dislocation or bone destruction. There is no significant elbow joint effusion. Advanced glenohumeral degenerative changes are present with obliteration of the subacromial space the left shoulder consistent with a chronic rotator cuff tear. Left subclavian AICD leads are noted. IMPRESSION: 1. Left upper extremity subcutaneous edema consistent with cellulitis. No focal fluid collection to suggest abscess identified. 2. No evidence of osteomyelitis or septic joint. 3. Advanced glenohumeral degenerative changes with evidence of chronic rotator cuff tear. Electronically Signed   By: Richardean Sale M.[name].   On: 01/13/2016 07:47    Scheduled: . sodium chloride   Intravenous Once  . antiseptic oral rinse  7 mL Mouth Rinse BID  . carvedilol  3.125 mg Oral BID WC  . collagenase   Topical Daily  . DULoxetine  30 mg Oral Daily  . pantoprazole (  PROTONIX) IV  40 mg Intravenous Q12H  . sodium chloride flush  3 mL Intravenous Q12H  . sodium chloride flush  3 mL Intravenous Q12H  . sodium chloride flush  3 mL Intravenous Q12H     LOS: 5 days   [name],[name] 01/13/2016,3:25 PM

## 2016-01-13 NOTE — Progress Notes (Signed)
Patient will DC to: [facility] Anticipated DC date: 01/13/16 Family notified: Wife Transport by: [facility]  CSW signing off.  Cedric Fishman, [place] Social Worker [phone]

## 2016-01-13 NOTE — Progress Notes (Signed)
Hospice facilities do not have bed availability or someone to come out and assess. [place] to assess patient in a.m.  Percell Locus Sicilia Killough LCSWA [phone]

## 2016-01-13 NOTE — Progress Notes (Signed)
CSW consulted regarding residential Hospice placement. CSW spoke with patient and patient's wife at bedside. Both are in agreement to go to residential hospice. The preference is [facility]. Patient's wife is tearful and very reluctant to send her husband to [facility] or [facility]. She does not have anyone to help her and she does not know if she can drive that far away. She is unable to bring him home if there is no Administrator.   CSW sent referral to residential hospice. They will come assess patient.  [name] LCSWA [phone]

## 2016-01-13 NOTE — Progress Notes (Signed)
ANTIBIOTIC CONSULT NOTE - INITIAL  Pharmacy Consult for Vancomycin Indication: cellulitis  No Known Allergies  Patient Measurements: Height: 5\' 11"  (180.3 cm) Weight: 145 lb 15.1 oz (66.2 kg) IBW/kg (Calculated) : 75.3  Vital Signs: BP: 98/40 mmHg (03/21 0932) Pulse Rate: 70 (03/21 0932)  Labs:  Recent Labs  01/11/16 0611 01/12/16 0710 01/13/16 0556  WBC 9.5 9.0 8.6  HGB 8.9* 9.8* 9.9*  PLT 138* 150 165  CREATININE 4.86* 5.88* 3.25*   ESRD  Microbiology: Recent Results (from the past 720 hour(s))  MRSA PCR Screening     Status: None   Collection Time: 01/08/16  6:14 PM  Result Value Ref Range Status   MRSA by PCR NEGATIVE NEGATIVE Final    Comment:        The GeneXpert MRSA Assay (FDA approved for NASAL specimens only), is one component of a comprehensive MRSA colonization surveillance program. It is not intended to diagnose MRSA infection nor to guide or monitor treatment for MRSA infections.     Medical History: Past Medical History  Diagnosis Date  . Dyslipidemia   . Chronic systolic CHF (congestive heart failure) ([place])     a. s/p ICD with upgrade to [employer] BiV-ICD 07/2015 when it had reached ERI.  . Paroxysmal atrial fibrillation ([place])   . Hypothyroidism   . Coronary artery disease     a. ant-lat MI s/p PTCA/DES to LAD 2006.  Marland Kitchen Hypertension   . Ischemic cardiomyopathy     a. s/p MDT CRTD  . AICD (automatic cardioverter/defibrillator) present   . Myocardial infarction ([place])   . Depression     hx  . History of blood transfusion 03/2015    LGIB  . GI bleed     a. 03/2015 in setting of elevated INR.  Marland Kitchen Arthritis     "everywhere"  . History of gout   . ESRD (end stage renal disease) on dialysis [facility])     "[place]; MWF" (07/10/2015)  . Prostate cancer ([place])     S/P seed implants  . Critical lower limb ischemia   . LBBB (left bundle branch block)   . Atrial tachycardia ([place])     a. WCT 06/2015 felt to be atrial tach.  . Anemia of  chronic disease   . PAD (peripheral artery disease) ([facility])    Assessment:   To begin Vancomycin for cellulitis.    ESRD, usual MWF dialysis.  Noted plan to stop hemodialysis and move to residential hospice.  Goal of Therapy:  vanc peak levels ~25 mcg/ml, re-dose when level < 15 mcg/ml  Plan:    Vancomycin 1250 mg IV x 1.   No standing orders if HD to stop.   Will follow up plans.    Arty Baumgartner, [place] Pager: [phone] 01/13/2016,11:01 AM

## 2016-01-20 ENCOUNTER — Ambulatory Visit: Payer: Medicare Other | Admitting: Sports Medicine

## 2016-02-10 ENCOUNTER — Encounter: Payer: Medicare Other | Admitting: *Deleted

## 2016-02-23 DEATH — deceased

## 2017-11-08 IMAGING — CT CT HEAD W/O CM
2 series · 16 of 30 positions shown, 18 images · non-contrast
Comparison: None.

CLINICAL DATA: Increased fall over several days. Bilateral leg
weakness.

EXAM:
CT HEAD WITHOUT CONTRAST
TECHNIQUE: Contiguous axial images were obtained from the base of the skull
through the vertex without intravenous contrast.

[Series 201: head w/o, idose (1) · axial · non-contrast · 0.44mm/px · z∈[+94,+199]mm · 8 of 29 slices shown, 10 images]
[im 4/29  brain]
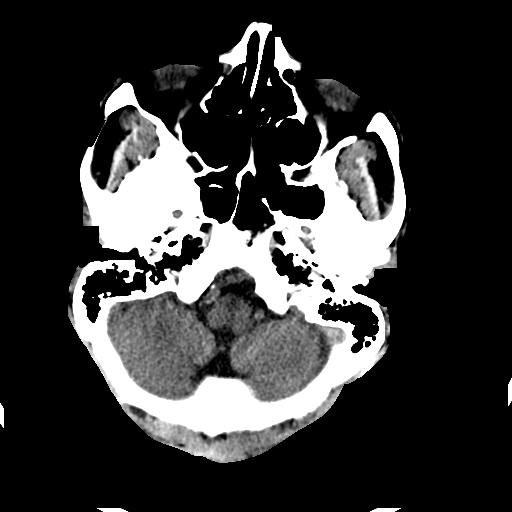
[im 4/29  bone]
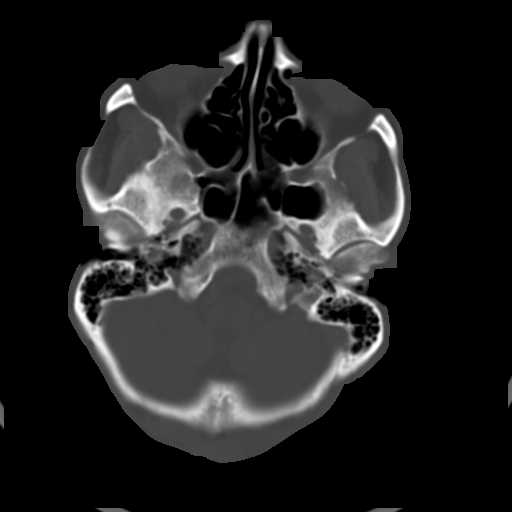
[im 7/29  brain]
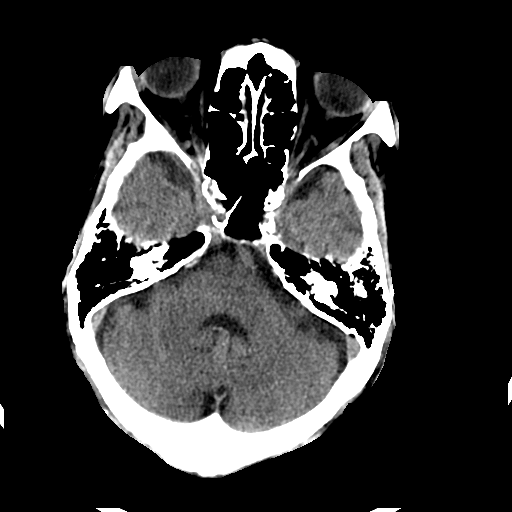
[im 10/29  brain]
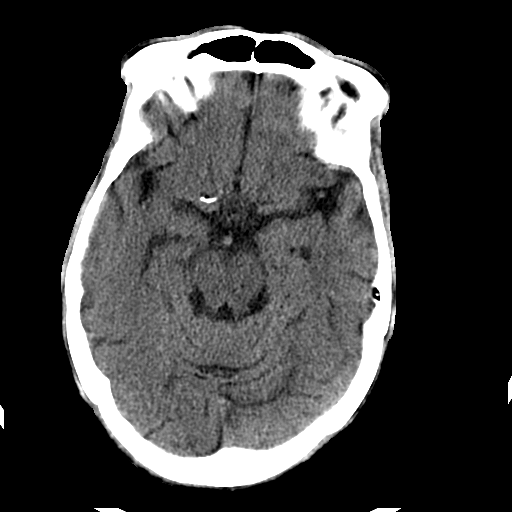
[im 13/29  brain]
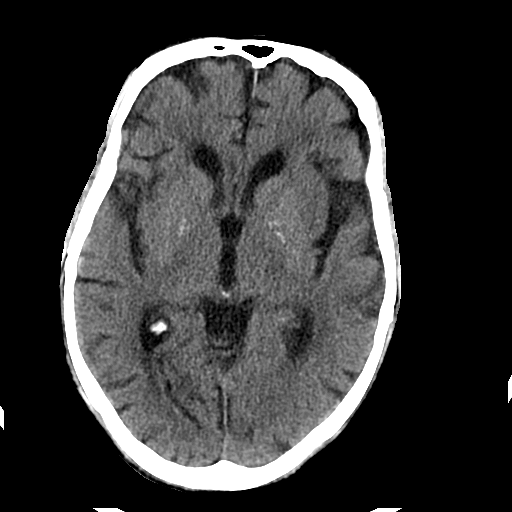
[im 16/29  brain]
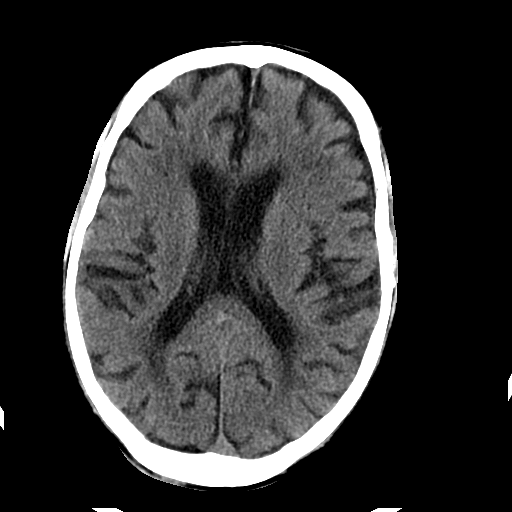
[im 16/29  bone]
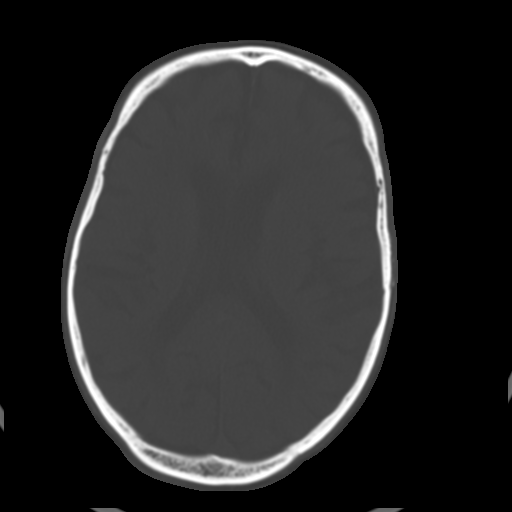
[im 19/29  brain]
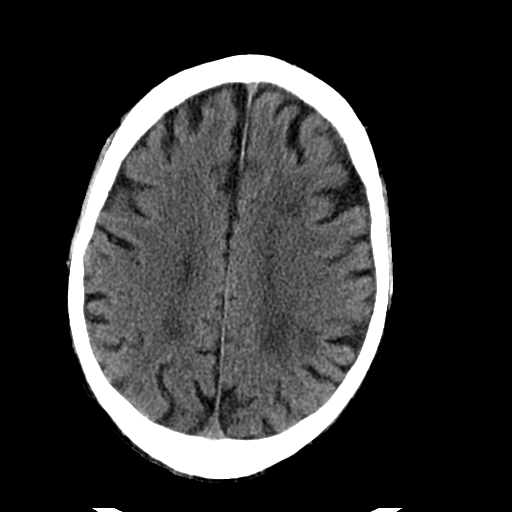
[im 22/29  brain]
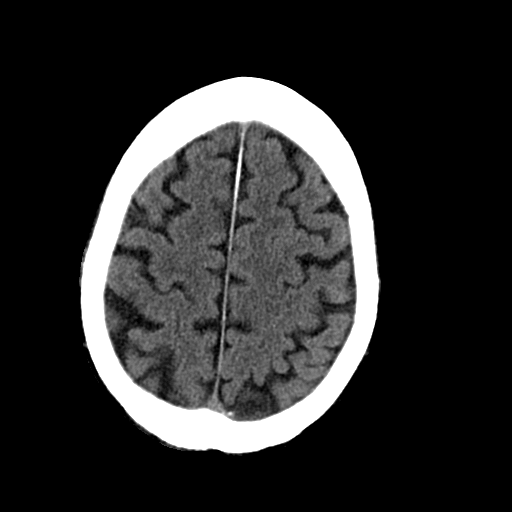
[im 25/29  brain]
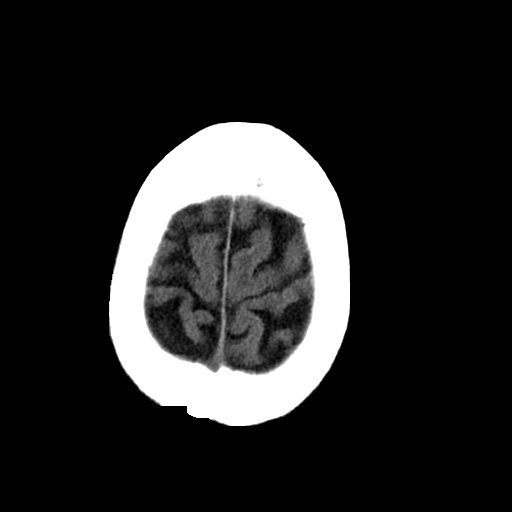

[Series 202: head w/o bone, idose (1) · axial · non-contrast · 0.44mm/px · z∈[+93,+206]mm · 8 of 58 slices shown]
[im 7/58  bone]
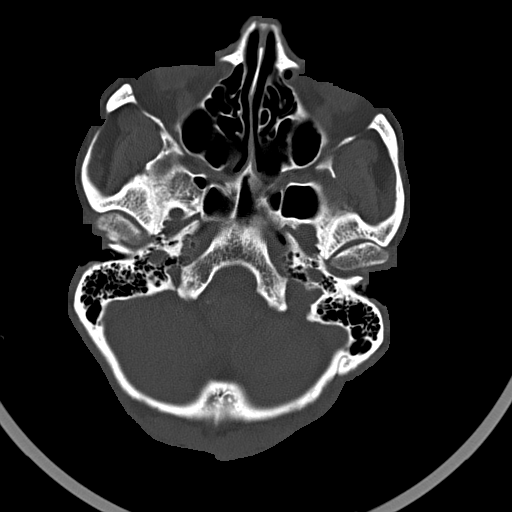
[im 13/58  bone]
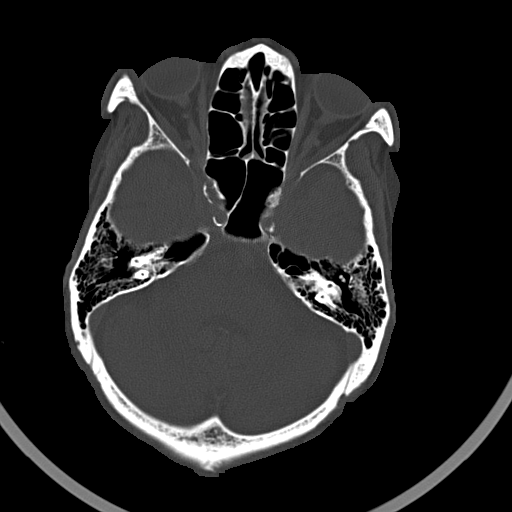
[im 19/58  bone]
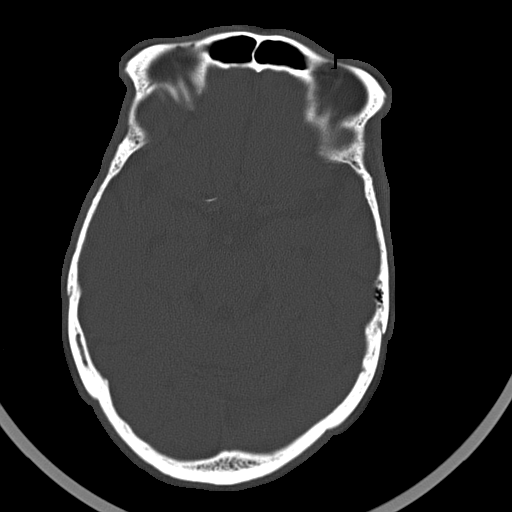
[im 25/58  bone]
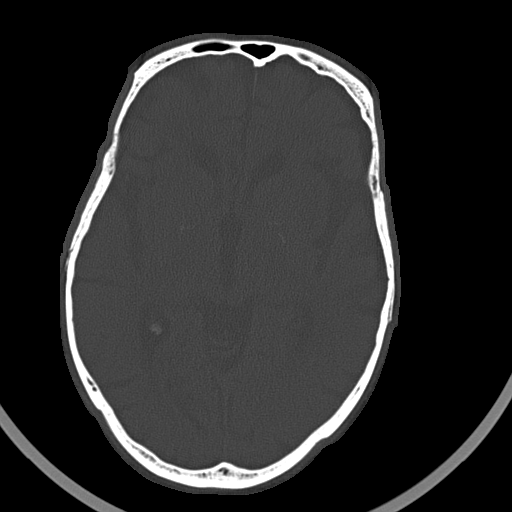
[im 34/58  bone]
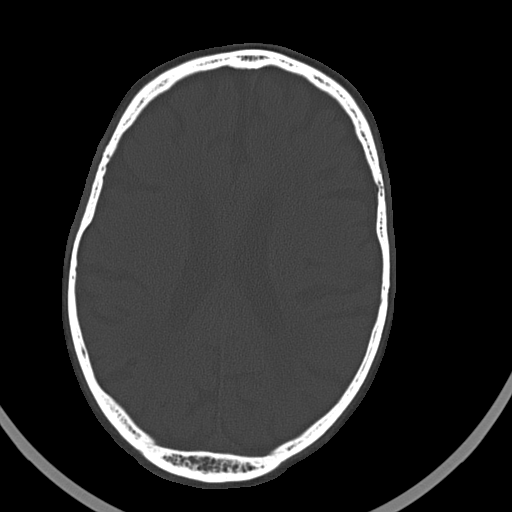
[im 40/58  bone]
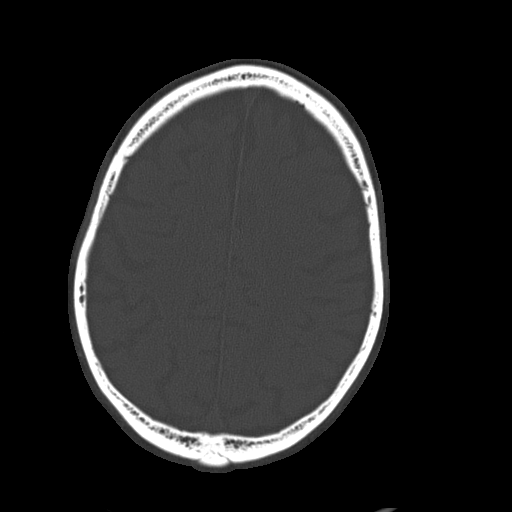
[im 46/58  bone]
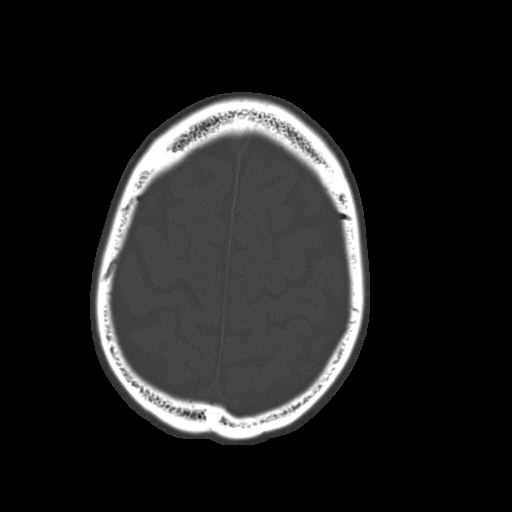
[im 52/58  bone]
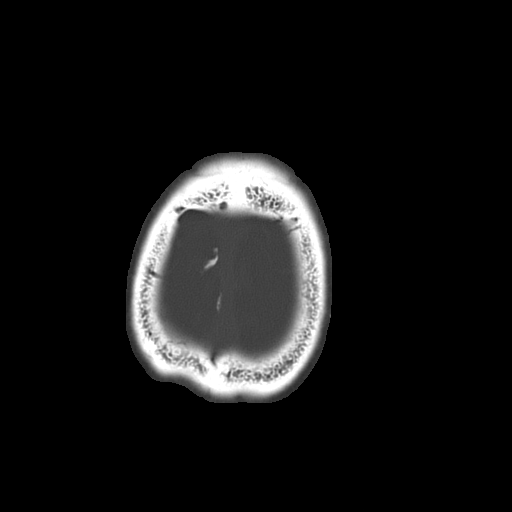

[16 of 30 positions shown; findings below may reference images not displayed]

FINDINGS: No acute intracranial hemorrhage. No focal mass lesion. No CT
evidence of acute infarction. No midline shift or mass effect. No
hydrocephalus. Basilar cisterns are patent.

Paranasal sinuses and  mastoid air cells are clear.

Atherosclerotic calcification of the vertebral arteries
IMPRESSION: *No acute intracranial findings.
*Chronic atrophy and white matter microvascular disease.
